# Patient Record
Sex: Female | Born: 1955 | Race: White | Hispanic: No | Marital: Single | Smoking: Never smoker
Health system: Southern US, Community
[De-identification: ages and names within clinical notes are randomized; demographics above are authoritative.]

## PROBLEM LIST (undated history)

## (undated) DIAGNOSIS — E119 Type 2 diabetes mellitus without complications: Secondary | ICD-10-CM

## (undated) DIAGNOSIS — J40 Bronchitis, not specified as acute or chronic: Secondary | ICD-10-CM

## (undated) DIAGNOSIS — S3210XA Unspecified fracture of sacrum, initial encounter for closed fracture: Secondary | ICD-10-CM

## (undated) DIAGNOSIS — F32A Depression, unspecified: Secondary | ICD-10-CM

## (undated) DIAGNOSIS — M5430 Sciatica, unspecified side: Secondary | ICD-10-CM

## (undated) DIAGNOSIS — I10 Essential (primary) hypertension: Secondary | ICD-10-CM

## (undated) DIAGNOSIS — M1990 Unspecified osteoarthritis, unspecified site: Secondary | ICD-10-CM

## (undated) DIAGNOSIS — D134 Benign neoplasm of liver: Secondary | ICD-10-CM

## (undated) DIAGNOSIS — K219 Gastro-esophageal reflux disease without esophagitis: Secondary | ICD-10-CM

## (undated) DIAGNOSIS — T7840XA Allergy, unspecified, initial encounter: Secondary | ICD-10-CM

## (undated) DIAGNOSIS — K824 Cholesterolosis of gallbladder: Secondary | ICD-10-CM

## (undated) DIAGNOSIS — F329 Major depressive disorder, single episode, unspecified: Secondary | ICD-10-CM

## (undated) DIAGNOSIS — S322XXA Fracture of coccyx, initial encounter for closed fracture: Secondary | ICD-10-CM

## (undated) HISTORY — PX: BREAST SURGERY: SHX581

## (undated) HISTORY — DX: Major depressive disorder, single episode, unspecified: F32.9

## (undated) HISTORY — DX: Essential (primary) hypertension: I10

## (undated) HISTORY — DX: Sciatica, unspecified side: M54.30

## (undated) HISTORY — DX: Unspecified osteoarthritis, unspecified site: M19.90

## (undated) HISTORY — DX: Bronchitis, not specified as acute or chronic: J40

## (undated) HISTORY — DX: Allergy, unspecified, initial encounter: T78.40XA

## (undated) HISTORY — PX: APPENDECTOMY: SHX54

## (undated) HISTORY — DX: Depression, unspecified: F32.A

## (undated) HISTORY — DX: Fracture of coccyx, initial encounter for closed fracture: S32.2XXA

## (undated) HISTORY — DX: Type 2 diabetes mellitus without complications: E11.9

## (undated) HISTORY — DX: Fracture of coccyx, initial encounter for closed fracture: S32.10XA

## (undated) HISTORY — PX: BREAST CYST EXCISION: SHX579

## (undated) HISTORY — PX: VASECTOMY: SHX75

## (undated) HISTORY — PX: WISDOM TOOTH EXTRACTION: SHX21

## (undated) HISTORY — PX: NASAL SEPTUM SURGERY: SHX37

## (undated) HISTORY — DX: Unspecified fracture of sacrum, initial encounter for closed fracture: S32.10XA

---
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
## 2001-07-10 ENCOUNTER — Ambulatory Visit
Admit: 2001-07-10 | Disposition: A | Discharge status: Home / Self Care | Payer: Self-pay | Source: Ambulatory Visit | Admitting: Otolaryngology

## 2006-08-10 ENCOUNTER — Ambulatory Visit
Admit: 2006-08-10 | Disposition: A | Discharge status: Home / Self Care | Payer: Self-pay | Source: Ambulatory Visit | Admitting: Internal Medicine

## 2006-08-10 LAB — COMPREHENSIVE METABOLIC PANEL
ALT: 33 U/L (ref 4–36)
AST (SGOT): 28 U/L (ref 10–41)
Albumin/Globulin Ratio: 1.6 (ref 1.1–1.8)
Albumin: 4.5 G/DL (ref 3.4–4.9)
Alkaline Phosphatase: 110 U/L (ref 43–112)
BUN: 13 MG/DL (ref 7–21)
Bilirubin, Total: 1.4 MG/DL — ABNORMAL HIGH (ref 0.2–1.0)
CO2: 24 MEQ/L (ref 22–31)
Calcium: 9.2 MG/DL (ref 8.6–10.2)
Chloride: 106 MEQ/L (ref 98–107)
Creatinine: 0.7 mg/dL (ref 0.6–1.5)
Globulin: 2.8 G/DL (ref 2.0–3.7)
Glucose: 74 MG/DL (ref 65–110)
Potassium: 4.4 MEQ/L (ref 3.6–5.0)
Protein, Total: 7.3 G/DL (ref 6.0–8.0)
Sodium: 142 MEQ/L (ref 136–143)

## 2006-08-10 LAB — CBC- CERNER
Hematocrit: 38.6 % (ref 37.0–47.0)
Hgb: 13.5 G/DL (ref 12.0–16.0)
MCH: 30 PG (ref 28.0–32.0)
MCHC: 34.8 G/DL (ref 32.0–36.0)
MCV: 86.1 FL (ref 80.0–100.0)
MPV: 7.3 FL — ABNORMAL LOW (ref 7.4–10.4)
Platelets: 353 /mm3 (ref 140–400)
RBC: 4.49 /mm3 (ref 4.20–5.40)
RDW: 12.7 % (ref 11.5–15.0)
WBC: 9.1 /mm3 (ref 3.5–10.8)

## 2006-08-11 LAB — LIPID PANEL
Cholesterol: 178 mg/dL (ref 50–200)
HDL: 40 mg/dL (ref 35–86)
LDL Calculated: 114 mg/dL (ref 0–130)
Triglycerides: 122 mg/dL (ref 35–135)
VLDL Calculated: 24 mg/dL (ref 10–40)

## 2006-08-11 LAB — TSH: TSH: 1.37 u[IU]/mL (ref 0.465–4.680)

## 2007-09-20 ENCOUNTER — Ambulatory Visit
Admit: 2007-09-20 | Disposition: A | Discharge status: Home / Self Care | Payer: Self-pay | Source: Ambulatory Visit | Admitting: Internal Medicine

## 2007-09-20 LAB — COMPREHENSIVE METABOLIC PANEL
ALT: 24 U/L (ref 4–36)
AST (SGOT): 23 U/L (ref 10–41)
Albumin/Globulin Ratio: 1.4 (ref 1.1–1.8)
Albumin: 4.6 G/DL (ref 3.4–4.9)
Alkaline Phosphatase: 102 U/L (ref 43–112)
BUN: 12 MG/DL (ref 7–21)
Bilirubin, Total: 1.5 MG/DL — ABNORMAL HIGH (ref 0.2–1.0)
CO2: 29 MEQ/L (ref 22–31)
Calcium: 10.1 MG/DL (ref 8.6–10.2)
Chloride: 102 MEQ/L (ref 98–107)
Creatinine: 0.9 mg/dL (ref 0.6–1.5)
Globulin: 3.2 G/DL (ref 2.0–3.7)
Glucose: 80 MG/DL (ref 65–110)
Potassium: 4.8 MEQ/L (ref 3.6–5.0)
Protein, Total: 7.8 G/DL (ref 6.0–8.0)
Sodium: 141 MEQ/L (ref 136–143)

## 2007-09-20 LAB — DIFFERENTIAL AUTOMATED
Basophils Absolute: 0 /mm3 (ref 0.0–0.2)
Basophils: 0 % (ref 0–2)
Eosinophils Absolute: 0.2 /mm3 (ref 0.0–0.7)
Eosinophils: 2 % (ref 0–5)
Granulocytes Absolute: 6.7 /mm3 (ref 1.8–8.1)
Lymphocytes Absolute: 3.3 /mm3 (ref 0.5–4.4)
Lymphocytes: 31 % (ref 15–41)
Monocytes Absolute: 0.6 /mm3 (ref 0.0–1.2)
Monocytes: 5 % (ref 0–11)
Neutrophils %: 62 % (ref 52–75)

## 2007-09-20 LAB — CBC
Hematocrit: 38.5 % (ref 37.0–47.0)
Hgb: 13 G/DL (ref 12.0–16.0)
MCH: 29.2 PG (ref 28.0–32.0)
MCHC: 33.8 G/DL (ref 32.0–36.0)
MCV: 86.5 FL (ref 80.0–100.0)
MPV: 9.7 FL (ref 9.4–12.3)
Platelets: 357 /mm3 (ref 140–400)
RBC: 4.45 /mm3 (ref 4.20–5.40)
RDW: 13.9 % (ref 11.5–15.0)
WBC: 10.76 /mm3 (ref 3.50–10.80)

## 2007-09-21 LAB — LIPID PANEL
Cholesterol: 216 mg/dL — ABNORMAL HIGH (ref 50–200)
HDL: 47 mg/dL (ref 35–86)
LDL Calculated: 129 mg/dL (ref 0–130)
Triglycerides: 200 mg/dL — ABNORMAL HIGH (ref 35–135)
VLDL Calculated: 40 mg/dL (ref 10–40)

## 2007-09-21 LAB — TSH: TSH: 1.27 u[IU]/mL (ref 0.465–4.680)

## 2007-11-24 ENCOUNTER — Emergency Department: Admit: 2007-11-24 | Payer: Self-pay | Source: Emergency Department | Admitting: Emergency Medicine

## 2007-11-24 LAB — COMPREHENSIVE METABOLIC PANEL
ALT: 31 U/L (ref 4–36)
AST (SGOT): 27 U/L (ref 10–41)
Albumin/Globulin Ratio: 1.2 (ref 1.1–1.8)
Albumin: 4 G/DL (ref 3.4–4.9)
Alkaline Phosphatase: 120 U/L — ABNORMAL HIGH (ref 43–112)
BUN: 12 MG/DL (ref 7–21)
Bilirubin, Total: 0.6 MG/DL (ref 0.2–1.0)
CO2: 28 MEQ/L (ref 22–31)
Calcium: 8.8 MG/DL (ref 8.6–10.2)
Chloride: 102 MEQ/L (ref 98–107)
Creatinine: 0.9 mg/dL (ref 0.6–1.5)
Globulin: 3.4 G/DL (ref 2.0–3.7)
Glucose: 94 MG/DL (ref 65–110)
Potassium: 4.5 MEQ/L (ref 3.6–5.0)
Protein, Total: 7.4 G/DL (ref 6.0–8.0)
Sodium: 140 MEQ/L (ref 136–143)

## 2007-11-24 LAB — CBC AND DIFFERENTIAL
Basophils Absolute: 0 /mm3 (ref 0.0–0.2)
Basophils: 0 % (ref 0–2)
Eosinophils Absolute: 0 /mm3 (ref 0.0–0.7)
Eosinophils: 0 % (ref 0–5)
Granulocytes Absolute: 7 /mm3 (ref 1.8–8.1)
Hematocrit: 36.7 % — ABNORMAL LOW (ref 37.0–47.0)
Hgb: 12.6 G/DL (ref 12.0–16.0)
Lymphocytes Absolute: 1.5 /mm3 (ref 0.5–4.4)
Lymphocytes: 17 % (ref 15–41)
MCH: 29.4 PG (ref 28.0–32.0)
MCHC: 34.3 G/DL (ref 32.0–36.0)
MCV: 85.7 FL (ref 80.0–100.0)
MPV: 9.1 FL — ABNORMAL LOW (ref 9.4–12.3)
Monocytes Absolute: 0.5 /mm3 (ref 0.0–1.2)
Monocytes: 6 % (ref 0–11)
Neutrophils %: 77 % — ABNORMAL HIGH (ref 52–75)
Platelets: 376 /mm3 (ref 140–400)
RBC: 4.28 /mm3 (ref 4.20–5.40)
RDW: 14.7 % (ref 11.5–15.0)
WBC: 9.07 /mm3 (ref 3.50–10.80)

## 2007-11-25 LAB — URINALYSIS WITH MICROSCOPIC
Bilirubin, UA: NEGATIVE
Blood, UA: NEGATIVE
Glucose, UA: NEGATIVE
Nitrite, UA: NEGATIVE
Specific Gravity UA POCT: 1.02 (ref <=1.030)
Urine pH: 7 (ref 5.0–8.0)
Urobilinogen, UA: 0.2

## 2008-08-07 ENCOUNTER — Ambulatory Visit
Admit: 2008-08-07 | Disposition: A | Discharge status: Home / Self Care | Payer: Self-pay | Source: Ambulatory Visit | Admitting: Internal Medicine

## 2008-08-07 LAB — BASIC METABOLIC PANEL
BUN: 13 MG/DL (ref 7–21)
CO2: 28 MEQ/L (ref 22–31)
Calcium: 9.5 MG/DL (ref 8.6–10.2)
Chloride: 99 MEQ/L (ref 98–107)
Creatinine: 0.8 mg/dL (ref 0.6–1.5)
Glucose: 74 MG/DL (ref 65–110)
Potassium: 4.1 MEQ/L (ref 3.6–5.0)
Sodium: 137 MEQ/L (ref 136–143)

## 2009-03-26 ENCOUNTER — Ambulatory Visit
Admit: 2009-03-26 | Disposition: A | Discharge status: Home / Self Care | Payer: Self-pay | Source: Ambulatory Visit | Admitting: Internal Medicine

## 2009-03-26 LAB — COMPREHENSIVE METABOLIC PANEL
ALT: 41 U/L — ABNORMAL HIGH (ref 4–36)
AST (SGOT): 29 U/L (ref 10–41)
Albumin/Globulin Ratio: 1.4 (ref 1.1–1.8)
Albumin: 4.3 g/dL (ref 3.4–4.9)
Alkaline Phosphatase: 104 U/L (ref 43–112)
BUN: 14 mg/dL (ref 7–21)
Bilirubin, Total: 1.4 mg/dL — ABNORMAL HIGH (ref 0.2–1.0)
CO2: 29 mEq/L (ref 22–31)
Calcium: 9.5 mg/dL (ref 8.6–10.2)
Chloride: 102 mEq/L (ref 98–107)
Creatinine: 0.8 mg/dL (ref 0.5–1.4)
Globulin: 3 g/dL (ref 2.0–3.7)
Glucose: 83 mg/dL (ref 70–100)
Potassium: 4.5 mEq/L (ref 3.6–5.0)
Protein, Total: 7.3 g/dL (ref 6.0–8.0)
Sodium: 142 mEq/L (ref 136–143)

## 2009-03-26 LAB — GFR: EGFR: >60

## 2009-10-30 ENCOUNTER — Ambulatory Visit
Admit: 2009-10-30 | Disposition: A | Discharge status: Home / Self Care | Payer: Self-pay | Source: Ambulatory Visit | Admitting: Internal Medicine

## 2009-10-30 LAB — COMPREHENSIVE METABOLIC PANEL
ALT: 45 U/L — ABNORMAL HIGH (ref 4–36)
AST (SGOT): 26 U/L (ref 10–41)
Albumin/Globulin Ratio: 1.5 (ref 1.1–1.8)
Albumin: 4.4 g/dL (ref 3.4–4.9)
Alkaline Phosphatase: 118 U/L — ABNORMAL HIGH (ref 43–112)
BUN: 14 mg/dL (ref 7–21)
Bilirubin, Total: 1.5 mg/dL — ABNORMAL HIGH (ref 0.2–1.0)
CO2: 29 mEq/L (ref 22–31)
Calcium: 9.2 mg/dL (ref 8.6–10.2)
Chloride: 104 mEq/L (ref 98–107)
Creatinine: 0.8 mg/dL (ref 0.5–1.4)
Globulin: 2.9 g/dL (ref 2.0–3.7)
Glucose: 111 mg/dL — ABNORMAL HIGH (ref 70–100)
Potassium: 5 mEq/L (ref 3.6–5.0)
Protein, Total: 7.3 g/dL (ref 6.0–8.0)
Sodium: 142 mEq/L (ref 136–143)

## 2009-10-30 LAB — TSH: TSH: 1.24 u[IU]/mL (ref 0.35–4.94)

## 2009-10-30 LAB — CBC
Hematocrit: 39.3 % (ref 37.0–47.0)
Hgb: 13.4 g/dL (ref 12.0–16.0)
MCH: 29.4 pg (ref 28.0–32.0)
MCHC: 34.1 g/dL (ref 32.0–36.0)
MCV: 86.2 fL (ref 80.0–100.0)
MPV: 9.6 fL (ref 9.4–12.3)
Platelets: 341 10*3/uL (ref 140–400)
RBC: 4.56 10*6/uL (ref 4.20–5.40)
RDW: 14 % (ref 12–15)
WBC: 7.06 10*3/uL (ref 3.50–10.80)

## 2009-10-30 LAB — LIPID PANEL
Cholesterol / HDL Ratio: 4.5 Index
Cholesterol: 192 mg/dL (ref 0–199)
HDL: 43 mg/dL (ref >40)
LDL Calculated: 123 mg/dL — ABNORMAL HIGH (ref 0–99)
Triglycerides: 130 mg/dL (ref 34–149)
VLDL Calculated: 26 mg/dL (ref 10–40)

## 2009-10-30 LAB — HEMOLYSIS INDEX: Hemolysis Index: 8 Index (ref 0–9)

## 2009-10-30 LAB — GFR: EGFR: >60

## 2009-11-05 ENCOUNTER — Ambulatory Visit: Admit: 2009-11-05 | Disposition: A | Discharge status: Home / Self Care | Payer: Self-pay | Source: Ambulatory Visit

## 2010-01-28 ENCOUNTER — Ambulatory Visit
Admit: 2010-01-28 | Disposition: A | Discharge status: Home / Self Care | Payer: Self-pay | Source: Ambulatory Visit | Admitting: Internal Medicine

## 2010-01-28 LAB — GFR: EGFR: >60

## 2010-01-28 LAB — COMPREHENSIVE METABOLIC PANEL
ALT: 41 U/L — ABNORMAL HIGH (ref 4–36)
AST (SGOT): 24 U/L (ref 10–41)
Albumin/Globulin Ratio: 1.4 (ref 1.1–1.8)
Albumin: 4.1 g/dL (ref 3.4–4.9)
Alkaline Phosphatase: 107 U/L (ref 43–112)
BUN: 15 mg/dL (ref 7–21)
Bilirubin, Total: 1.4 mg/dL — ABNORMAL HIGH (ref 0.2–1.0)
CO2: 29 mEq/L (ref 22–31)
Calcium: 9.4 mg/dL (ref 8.6–10.2)
Chloride: 102 mEq/L (ref 98–107)
Creatinine: 0.9 mg/dL (ref 0.5–1.4)
Globulin: 3 g/dL (ref 2.0–3.7)
Glucose: 84 mg/dL (ref 70–100)
Potassium: 4 mEq/L (ref 3.6–5.0)
Protein, Total: 7.1 g/dL (ref 6.0–8.0)
Sodium: 140 mEq/L (ref 136–143)

## 2010-01-28 LAB — HEMOGLOBIN A1C: Hemoglobin A1C: 5.5 % (ref 0.0–6.0)

## 2010-10-13 LAB — ECG 12-LEAD
Atrial Rate: 66 {beats}/min
P Axis: 54 degrees
P-R Interval: 146 ms
Q-T Interval: 392 ms
QRS Duration: 76 ms
QTC Calculation (Bezet): 410 ms
R Axis: 10 degrees
T Axis: 30 degrees
Ventricular Rate: 66 {beats}/min

## 2011-03-03 ENCOUNTER — Ambulatory Visit
Admit: 2011-03-03 | Discharge: 2011-03-03 | Disposition: A | Discharge status: Home / Self Care | Payer: Self-pay | Source: Ambulatory Visit

## 2011-03-03 LAB — CBC
Hematocrit: 38.5 % (ref 37.0–47.0)
Hgb: 12.6 g/dL (ref 12.0–16.0)
MCH: 29.2 pg (ref 28.0–32.0)
MCHC: 32.7 g/dL (ref 32.0–36.0)
MCV: 89.3 fL (ref 80.0–100.0)
MPV: 10.3 fL (ref 9.4–12.3)
Nucleated RBC: 0 /100 WBC
Platelets: 305 10*3/uL (ref 140–400)
RBC: 4.31 10*6/uL (ref 4.20–5.40)
RDW: 13 % (ref 12–15)
WBC: 6.12 10*3/uL (ref 3.50–10.80)

## 2011-03-03 LAB — COMPREHENSIVE METABOLIC PANEL
ALT: 26 U/L (ref 0–55)
AST (SGOT): 24 U/L (ref 5–34)
Albumin/Globulin Ratio: 1.3 (ref 0.9–2.2)
Albumin: 4 g/dL (ref 3.5–5.0)
Alkaline Phosphatase: 99 U/L (ref 40–150)
BUN: 16 mg/dL (ref 6.0–20.0)
Bilirubin, Total: 1.2 mg/dL (ref 0.1–1.2)
CO2: 28 mEq/L (ref 21–30)
Calcium: 10 mg/dL (ref 8.5–10.5)
Chloride: 104 mEq/L (ref 96–109)
Creatinine: 0.8 mg/dL (ref 0.4–1.5)
Globulin: 3 g/dL (ref 2.0–3.7)
Glucose: 81 mg/dL (ref 70–100)
Potassium: 4.1 mEq/L (ref 3.5–5.3)
Protein, Total: 7 g/dL (ref 6.0–8.3)
Sodium: 143 mEq/L (ref 135–146)

## 2011-03-03 LAB — HEMOLYSIS INDEX: Hemolysis Index: 5 Index (ref 0–9)

## 2011-03-03 LAB — LIPID PANEL
Cholesterol / HDL Ratio: 3.4 Index
Cholesterol: 186 mg/dL (ref 0–199)
HDL: 54 mg/dL (ref >40)
LDL Calculated: 111 mg/dL — ABNORMAL HIGH (ref 0–99)
Triglycerides: 105 mg/dL (ref 34–149)
VLDL Calculated: 21 mg/dL (ref 10–40)

## 2011-03-03 LAB — TSH: TSH: 1.1 u[IU]/mL (ref 0.35–4.94)

## 2011-03-03 LAB — GFR: EGFR: >60

## 2011-03-04 LAB — VITAMIN D-25 HYDROXY (D2/D3/TOTAL)
Vitamin D 25-OH D2: <4 ng/mL
Vitamin D 25-OH D3: 44 ng/mL
Vitamin D 25-OH Total: 44 ng/mL (ref 30–100)

## 2013-12-18 ENCOUNTER — Other Ambulatory Visit: Payer: Self-pay

## 2013-12-18 ENCOUNTER — Ambulatory Visit
Admission: RE | Admit: 2013-12-18 | Discharge: 2013-12-18 | Disposition: A | Discharge status: Home / Self Care | Payer: No Typology Code available for payment source | Source: Ambulatory Visit

## 2013-12-18 DIAGNOSIS — M79671 Pain in right foot: Secondary | ICD-10-CM

## 2015-01-21 ENCOUNTER — Other Ambulatory Visit: Payer: Self-pay | Admitting: Otolaryngology

## 2015-01-21 DIAGNOSIS — J322 Chronic ethmoidal sinusitis: Secondary | ICD-10-CM

## 2015-01-22 ENCOUNTER — Other Ambulatory Visit: Payer: Self-pay | Admitting: Otolaryngology

## 2015-01-22 ENCOUNTER — Ambulatory Visit
Admission: RE | Admit: 2015-01-22 | Discharge: 2015-01-22 | Disposition: A | Discharge status: Home / Self Care | Payer: PPO | Source: Ambulatory Visit | Attending: Otolaryngology | Admitting: Otolaryngology

## 2015-01-22 DIAGNOSIS — J342 Deviated nasal septum: Secondary | ICD-10-CM | POA: Insufficient documentation

## 2015-01-22 DIAGNOSIS — J322 Chronic ethmoidal sinusitis: Secondary | ICD-10-CM

## 2015-01-22 DIAGNOSIS — J3489 Other specified disorders of nose and nasal sinuses: Secondary | ICD-10-CM | POA: Insufficient documentation

## 2015-03-05 DIAGNOSIS — I10 Essential (primary) hypertension: Secondary | ICD-10-CM

## 2015-03-05 HISTORY — DX: Essential (primary) hypertension: I10

## 2015-03-10 HISTORY — PX: SINUS SURGERY: SHX187

## 2015-03-19 DIAGNOSIS — E559 Vitamin D deficiency, unspecified: Secondary | ICD-10-CM | POA: Insufficient documentation

## 2015-11-02 ENCOUNTER — Ambulatory Visit (INDEPENDENT_AMBULATORY_CARE_PROVIDER_SITE_OTHER): Payer: BC Managed Care – PPO | Admitting: Surgery

## 2015-11-02 ENCOUNTER — Encounter (INDEPENDENT_AMBULATORY_CARE_PROVIDER_SITE_OTHER): Payer: Self-pay | Admitting: Surgery

## 2015-11-02 VITALS — BP 132/77 | HR 72 | Resp 12 | Ht 64.0 in | Wt 183.4 lb

## 2015-11-02 DIAGNOSIS — Z1231 Encounter for screening mammogram for malignant neoplasm of breast: Secondary | ICD-10-CM

## 2015-11-02 DIAGNOSIS — R928 Other abnormal and inconclusive findings on diagnostic imaging of breast: Secondary | ICD-10-CM

## 2015-11-02 DIAGNOSIS — Z1239 Encounter for other screening for malignant neoplasm of breast: Secondary | ICD-10-CM

## 2015-11-02 NOTE — Progress Notes (Signed)
Breast Cancer Screening Exam    History:  Diane Wolfe is a 60 y.o. female who presents to the office for breast cancer screening.  She is at an average risk for breast cancer.  The patient is known to me from my prior practice.  She has lost 84 pounds on an organic diet. She lost it over a period of a year. She denies GI complaints or breast complaints. She had her mammogram done today and it looks different due to weight loss.      Past Medical History :  History reviewed. No pertinent past medical history.    Past Surgical History:  Past Surgical History:   Procedure Laterality Date   . APPENDECTOMY  1969   . SINUS SURGERY  03/2015       Family History:  Family History   Problem Relation Age of Onset   . Breast cancer Paternal Aunt        Social History:  Social History     Social History   . Marital status: Single     Spouse name: N/A   . Number of children: N/A   . Years of education: N/A     Occupational History   . Not on file.     Social History Main Topics   . Smoking status: Never Smoker   . Smokeless tobacco: Never Used   . Alcohol use No   . Drug use: No   . Sexual activity: Not on file     Other Topics Concern   . Not on file     Social History Narrative   . No narrative on file        ALLERGIES:  Allergies   Allergen Reactions   . Penicillins        Medications:  No current outpatient prescriptions on file.    Review of Systems:  Constitutional: Negative.   HENT: Negative.   Respiratory: Negative.   Cardiovascular: Negative.   Gastrointestinal: Negative for nausea, vomiting, diarrhea, constipation and abdominal distention.   Genitourinary: Negative.   Musculoskeletal: Negative.   Skin: no history of jaundice   Allergic/Immunologic: Negative for immunocompromised state.   Neurological: Negative.   Hematological: Negative for adenopathy. Does not bruise/bleed easily.   Psychiatric/Behavioral: Negative.     Objective:  Vitals:    11/02/15 1252   BP: 132/77   BP Site: Left arm   Patient  Position: Sitting   Cuff Size: Medium   Pulse: 72   Resp: 12   Weight: 83.2 kg (183 lb 6.4 oz)   Height: 1.626 m (5\' 4" )     Body mass index is 31.48 kg/m.     Physical Exam:   Constitutional: Alert, oriented. Body mass index is 31.48 kg/m.  Head: Normocephalic and atraumatic.   Eyes: No scleral icterus.   Neck: Normal range of motion. There is no cervical adenopathy  Cardiovascular: Normal rate, normal S1,2, without rubs, gallops, or murmurs.    Pulmonary/Chest: Effort normal, BS equal, without rales, wheezes, or stridor.    Breast: The breasts are symmetric, without skin changes, nipple changes or dimpling.  There are no palpable masses on either side.  There is no axillary adenopathy. She has smaller breasts than on previous exams. She has no sign of inframammary sebaceous cysts.  Musculoskeletal: Normal range of motion.   Neurological: alert and oriented to person, place, and time.   Skin:warm and dry  Psychiatric: Normal mood and affect. Behavior is normal. Judgment and thought  content normal.   Nursing note and vitals reviewed.    Radiology: [facility] mammography was performed today bilaterally.    The reports were authenticated and the imaging was personally reviewed.  They are normal.   Additional views were needed due to the loss of breast fat.        Assessment:No evidence of breast cancer      Plan:Follow up in 1 year with repeat mammography and examination.   She tells me that she may be relocating. Advised annual mammography  In [place], where she will likely be going.      Harvie Bridge MD, FACS

## 2016-01-01 ENCOUNTER — Encounter (INDEPENDENT_AMBULATORY_CARE_PROVIDER_SITE_OTHER): Payer: Self-pay | Admitting: Surgery

## 2017-04-14 ENCOUNTER — Encounter: Payer: Self-pay | Admitting: Emergency Medicine

## 2017-04-14 ENCOUNTER — Other Ambulatory Visit: Payer: Self-pay

## 2017-04-14 ENCOUNTER — Emergency Department
Admission: EM | Admit: 2017-04-14 | Discharge: 2017-04-14 | Disposition: A | Discharge status: Home / Self Care | Payer: Managed Care, Other (non HMO) | Attending: Internal Medicine | Admitting: Internal Medicine

## 2017-04-14 ENCOUNTER — Emergency Department: Payer: Managed Care, Other (non HMO)

## 2017-04-14 DIAGNOSIS — Y929 Unspecified place or not applicable: Secondary | ICD-10-CM | POA: Insufficient documentation

## 2017-04-14 DIAGNOSIS — X58XXXA Exposure to other specified factors, initial encounter: Secondary | ICD-10-CM | POA: Insufficient documentation

## 2017-04-14 DIAGNOSIS — I10 Essential (primary) hypertension: Secondary | ICD-10-CM | POA: Diagnosis not present

## 2017-04-14 DIAGNOSIS — Y999 Unspecified external cause status: Secondary | ICD-10-CM | POA: Insufficient documentation

## 2017-04-14 DIAGNOSIS — S8392XA Sprain of unspecified site of left knee, initial encounter: Secondary | ICD-10-CM | POA: Diagnosis not present

## 2017-04-14 DIAGNOSIS — S86912A Strain of unspecified muscle(s) and tendon(s) at lower leg level, left leg, initial encounter: Secondary | ICD-10-CM

## 2017-04-14 DIAGNOSIS — M25562 Pain in left knee: Secondary | ICD-10-CM | POA: Diagnosis present

## 2017-04-14 DIAGNOSIS — M1712 Unilateral primary osteoarthritis, left knee: Secondary | ICD-10-CM | POA: Diagnosis not present

## 2017-04-14 DIAGNOSIS — Y939 Activity, unspecified: Secondary | ICD-10-CM | POA: Insufficient documentation

## 2017-04-14 HISTORY — DX: Essential (primary) hypertension: I10

## 2017-04-14 IMAGING — DX DG KNEE COMPLETE 4+V*L*
4 series · 4 of 4 positions shown · non-contrast
Comparison: None.

CLINICAL DATA: Pain and swelling for a few days.

EXAM:
LEFT KNEE - COMPLETE 4+ VIEW

[knee ap]
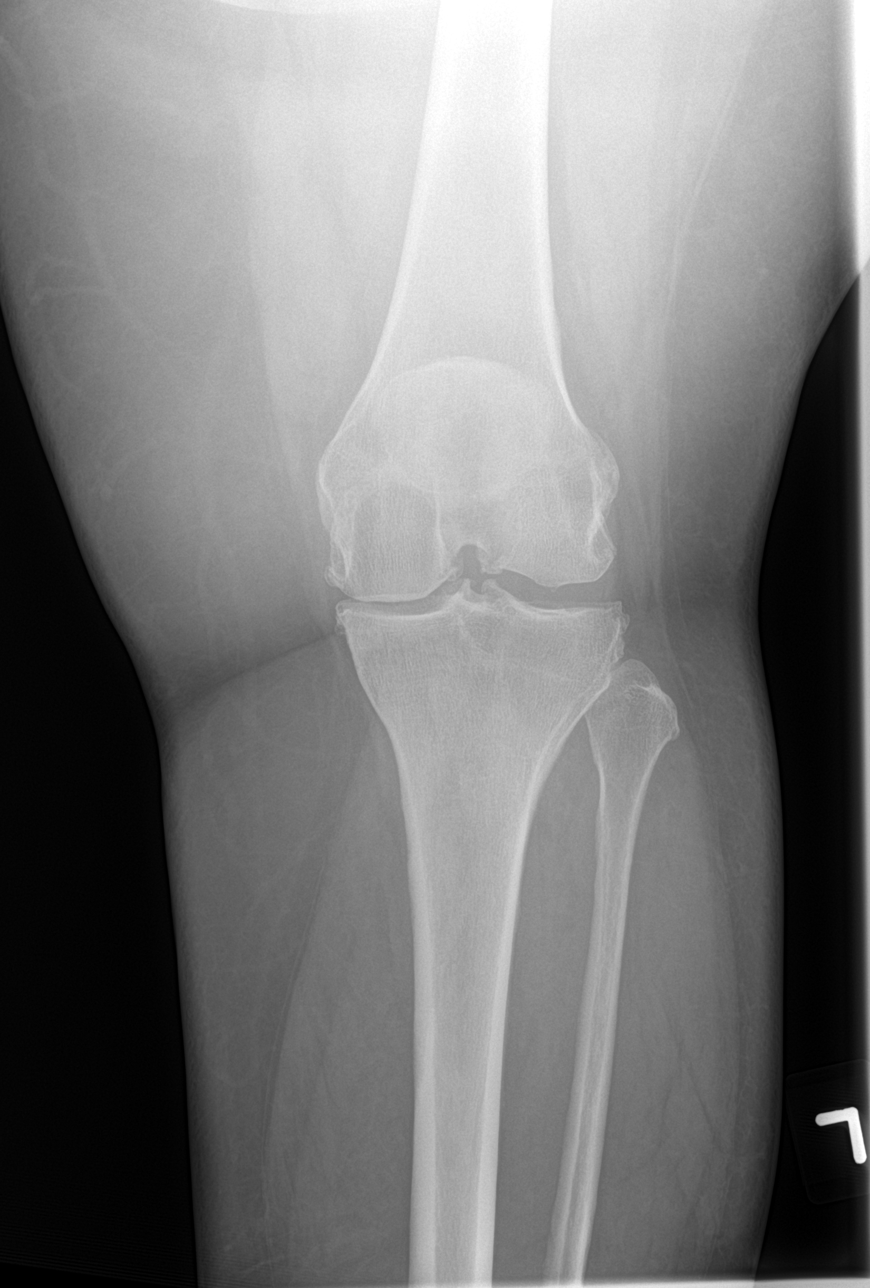

[knee lat]
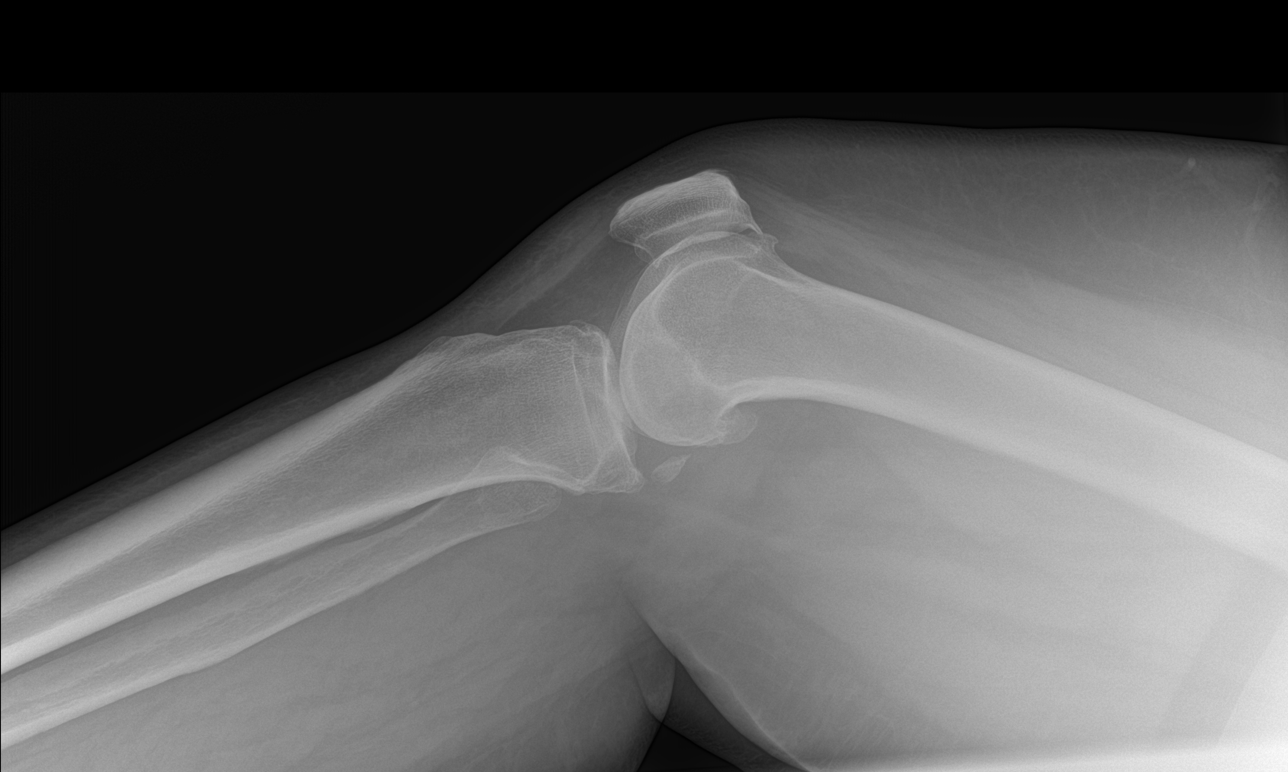

[knee obl (1 of 2)]
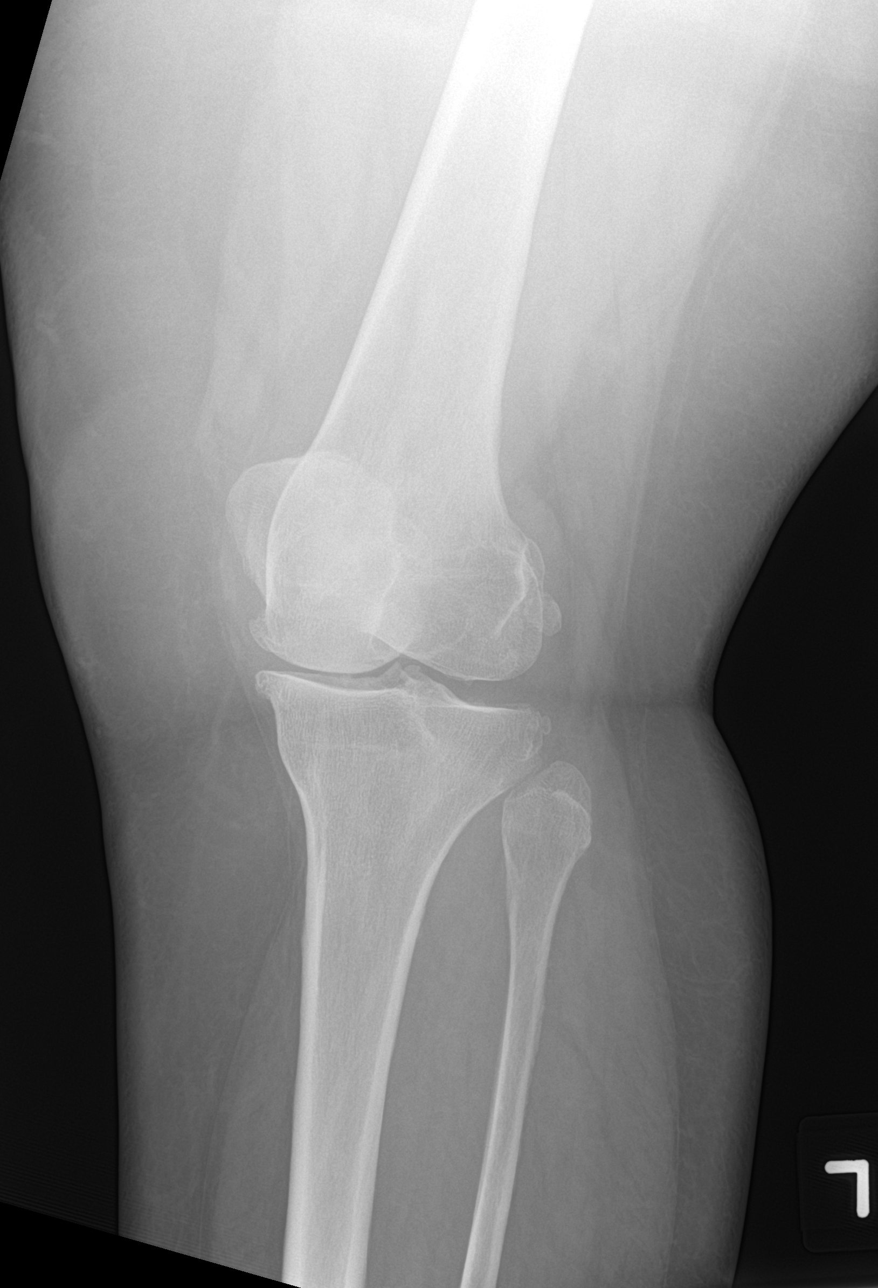

[knee obl (2 of 2)]
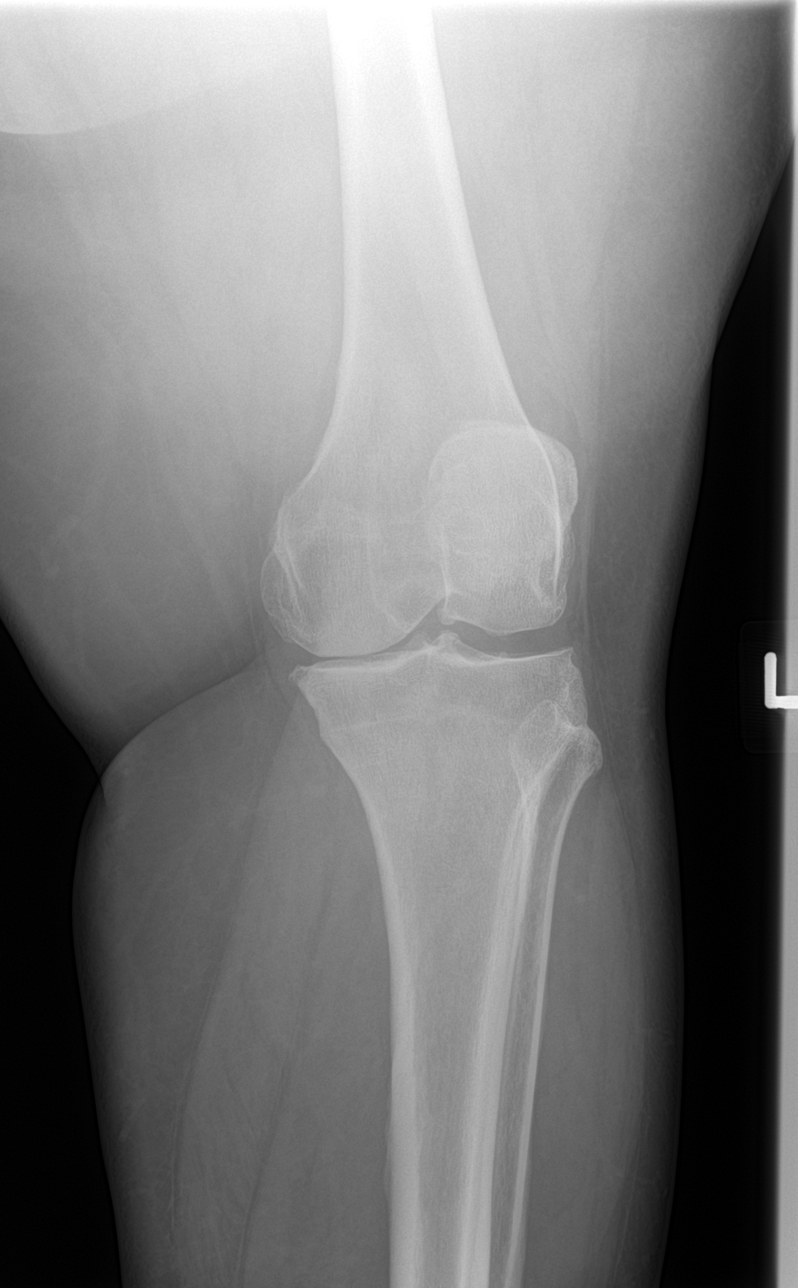

[4 of 4 positions shown; findings below may reference images not displayed]

FINDINGS: No fracture or dislocation. No joint effusion. Moderate
tricompartmental degenerative changes.
IMPRESSION: Moderate tricompartmental degenerative changes.

## 2017-04-14 MED ORDER — CYCLOBENZAPRINE HCL 5 MG PO TABS: 5.0000 mg | ORAL_TABLET | Freq: Three times a day (TID) | ORAL | 0 refills | Status: DC | PRN

## 2017-04-14 NOTE — ED Provider Notes (Signed)
[facility] Emergency Department Provider Note ____________________________________________  Time seen: 1204  I have reviewed the triage vital signs and the nursing notes.  HISTORY  Chief Complaint  Knee Pain  HPI Tina Stephenson is a 62 y.o. female resents to the ED accompanied by family, for evaluation of a 2-3-day complaint of left knee pain.  Patient describes she was at work on Friday, when she gingerly went to stretch her legs out under her desk.  She noted at the time she had an immediate pop to the posterior aspect of the left knee.  She was initially able to ambulate but as the days went on she describes increasing tightness and inability to fully extend the knee.  She is been ambulating on her toes and describes pain to the posterior aspect of the knee.  She denies any history of previous or ongoing knee problems.  She ha been taking Tylenol and Motrin for pain relief.  Past Medical History:  Diagnosis Date  . Hypertension     There are no active problems to display for this patient.   Past Surgical History:  Procedure Laterality Date  . APPENDECTOMY    . NASAL SEPTUM SURGERY      Prior to Admission medications   Medication Sig Start Date End Date Taking? Authorizing Provider  cyclobenzaprine (FLEXERIL) 5 MG tablet Take 1 tablet (5 mg total) by mouth 3 (three) times daily as needed for muscle spasms. 04/14/17   Averill Winters, Dannielle Karvonen, PA-C    Allergies Penicillins  No family history on file.  Social History Social History   Tobacco Use  . Smoking status: Never Smoker  . Smokeless tobacco: Never Used  Substance Use Topics  . Alcohol use: Not Currently  . Drug use: Not on file    Review of Systems  Constitutional: Negative for fever. Cardiovascular: Negative for chest pain. Respiratory: Negative for shortness of breath. Musculoskeletal: Negative for back pain.  Left knee pain as above. Skin: Negative for rash. Neurological: Negative  for headaches, focal weakness or numbness. ____________________________________________  PHYSICAL EXAM:  VITAL SIGNS: ED Triage Vitals  Enc Vitals Group     BP 04/14/17 1119 (!) 146/69     Pulse Rate 04/14/17 1119 62     Resp 04/14/17 1119 18     Temp 04/14/17 1119 98.6 F (37 C)     Temp Source 04/14/17 1119 Oral     SpO2 04/14/17 1119 99 %     Weight 04/14/17 1103 230 lb (104.3 kg)     Height 04/14/17 1103 5\' 4"  (1.626 m)     Head Circumference --      Peak Flow --      Pain Score 04/14/17 1102 10     Pain Loc --      Pain Edu? --      Excl. in [place]? --     Constitutional: Alert and oriented. Well appearing and in no distress. Head: Normocephalic and atraumatic. Cardiovascular: Normal rate, regular rhythm. Normal distal pulses. Respiratory: Normal respiratory effort. No wheezes/rales/rhonchi. Musculoskeletal: Left knee without obvious deformity, dislocation, or effusion.  Patient with normal flexion and extension range, but has tenderness to palpation to the posterior aspect of the knee.  Her pain is primarily to the medial hamstring insertion.  No popliteal space fullness is noted.  There is some mild tenderness to the posterior calf, but no muscle deformity is noted.  Ankle exam is benign at this time.  There is some increased pain  with full extension of the knee as well as some medial pain with valgus stress.  Nontender with normal range of motion in all extremities.  Neurologic:  No gross focal neurologic deficits are appreciated. Skin:  Skin is warm, dry and intact. No rash noted. ____________________________________________   RADIOLOGY  Left Knee IMPRESSION: Moderate tricompartmental degenerative changes. ____________________________________________  PROCEDURES  Procedures Knee immobilizer crutches ____________________________________________  INITIAL IMPRESSION / ASSESSMENT AND PLAN / ED COURSE  Patient with a ED evaluation of left knee pain posteriorly.  Her  exam is overall more consistent with a hamstring strain and underlying DJD than any internal derangement to the knee.  Patient understands the underlying changes to the knee joint.  She will be placed in a knee immobilizer for comfort as well as given crutches to ambulate with weightbearing as tolerated.  She will follow-up with orthopedics or return to the ED as necessary.  A prescription for Flexeril is provided for pain relief as necessary. ____________________________________________  FINAL CLINICAL IMPRESSION(S) / ED DIAGNOSES  Final diagnoses:  Strain of left knee, initial encounter  Primary osteoarthritis of left knee      Melvenia Needles, PA-C 04/14/17 1901    Earleen Newport, MD 04/17/17 [phone]

## 2017-04-14 NOTE — ED Notes (Addendum)
Pt reports stretching left knee at work and "heard a snap" in the back of her knee. She has noticed increased swelling to the area since, which the majority of it on the medial aspect. Pt states that her pain has been increasing daily as well, and was unable to bear weight this morning on it. Also states at times she "cannot feel her [left] foot."   Pt reports taking tylenol and advil for pain since Wednesday with some relief but no relief from the swelling.

## 2017-04-14 NOTE — ED Notes (Signed)
This RN applied knee immobilizer to left knee and instructed pt on how to use crutches. We will practice with crutches prior to discharge, but pt verbalized effective teachback re: how to properly use crutches.

## 2017-04-14 NOTE — Discharge Instructions (Addendum)
Your exam is consistent with a knee sprain and hamstring muscle strain. Wear the knee immobilizer as needed for support. Apply ice or moist heat to reduce pain. Take the muscle relaxant as needed. See ortho for ongoing symptoms.

## 2017-04-14 NOTE — ED Triage Notes (Signed)
Pt states she was stretching her left knee at her desk a few days ago, states she felt a "pop" and has been having pain and swelling since then.

## 2017-04-14 NOTE — ED Notes (Signed)
Family impatiently requesting results from XR. This RN informed them the results only came back 4 minutes ago and that the provider will update them once they have reviewed the report and images.

## 2017-07-24 ENCOUNTER — Ambulatory Visit (INDEPENDENT_AMBULATORY_CARE_PROVIDER_SITE_OTHER): Payer: Managed Care, Other (non HMO) | Admitting: Podiatry

## 2017-07-24 ENCOUNTER — Encounter: Payer: Self-pay | Admitting: Podiatry

## 2017-07-24 DIAGNOSIS — Z79899 Other long term (current) drug therapy: Secondary | ICD-10-CM | POA: Diagnosis not present

## 2017-07-24 DIAGNOSIS — B351 Tinea unguium: Secondary | ICD-10-CM | POA: Diagnosis not present

## 2017-07-24 MED ORDER — TERBINAFINE HCL 250 MG PO TABS: 250.0000 mg | ORAL_TABLET | Freq: Every day | ORAL | 0 refills | Status: DC

## 2017-08-01 LAB — HEPATIC FUNCTION PANEL
ALK PHOS: 117 IU/L (ref 39–117)
ALT: 19 IU/L (ref 0–32)
AST: 18 IU/L (ref 0–40)
Albumin: 4.6 g/dL (ref 3.6–4.8)
BILIRUBIN TOTAL: 1 mg/dL (ref 0.0–1.2)
BILIRUBIN, DIRECT: 0.22 mg/dL (ref 0.00–0.40)
Total Protein: 7 g/dL (ref 6.0–8.5)

## 2017-08-02 NOTE — Progress Notes (Signed)
   Subjective: 63 year old female presenting today as a new patient with a chief complaint of possible nail fungus of bilateral great toes that has been present for several months. Patient states she had nail avulsion procedures to the toes for the treatment of ingrowns. She states the nails grew back discolored and thickened. She was treated with an antifungal nail lacquer that did not seem to help. There are no modifying factors noted. Patient presents today for further treatment and evaluation.  Past Medical History:  Diagnosis Date  . Hypertension     Objective: Physical Exam General: The patient is alert and oriented x3 in no acute distress.  Dermatology: Hyperkeratotic, discolored, thickened, onychodystrophy of nails noted bilaterally. Skin is warm, dry and supple bilateral lower extremities. Negative for open lesions or macerations.  Vascular: Palpable pedal pulses bilaterally. No edema or erythema noted. Capillary refill within normal limits.  Neurological: Epicritic and protective threshold grossly intact bilaterally.   Musculoskeletal Exam: Range of motion within normal limits to all pedal and ankle joints bilateral. Muscle strength 5/5 in all groups bilateral.   Assessment: #1 onychomycosis bilateral nails 1-5   Plan of Care:  #1 Patient was evaluated. #2 Orders for liver function tests were ordered today. Office will call if results are abnormal.  #3 Prescription for Lamisil 250 mg #90 provided to patient.  #4 Patient may benefit from laser treatment in the future.  #5 Return to clinic as needed.    Edrick Kins, DPM [facility]  Dr. Edrick Kins, [address]                                        [place], [place] [address]                Office [phone]  Fax [phone]

## 2017-09-24 ENCOUNTER — Telehealth: Payer: Self-pay | Admitting: Podiatry

## 2017-09-24 NOTE — Telephone Encounter (Signed)
Pt said Dr. Amalia Hailey put her on 90 days of medication and Insurance will only pay for 60 days. Insurance will reinburse pt. For the money she spent out of pocket for medication but Dr. Hazel Sams to call the [facility] ([facility] @ [phone] before that will take place.

## 2017-10-01 NOTE — Telephone Encounter (Signed)
Prior authorization was done, They denied coverage for Terbinafine.  Patient was contacted and notified via voice mail of denial.

## 2018-02-21 ENCOUNTER — Other Ambulatory Visit: Payer: Self-pay

## 2018-02-21 ENCOUNTER — Ambulatory Visit: Payer: Managed Care, Other (non HMO) | Admitting: Family Medicine

## 2018-02-21 ENCOUNTER — Ambulatory Visit
Admission: RE | Admit: 2018-02-21 | Discharge: 2018-02-21 | Disposition: A | Discharge status: Home / Self Care | Payer: Managed Care, Other (non HMO) | Attending: Family Medicine | Admitting: Family Medicine

## 2018-02-21 ENCOUNTER — Encounter: Payer: Self-pay | Admitting: Family Medicine

## 2018-02-21 ENCOUNTER — Ambulatory Visit
Admission: RE | Admit: 2018-02-21 | Discharge: 2018-02-21 | Disposition: A | Discharge status: Home / Self Care | Payer: Managed Care, Other (non HMO) | Source: Ambulatory Visit | Attending: Family Medicine | Admitting: Family Medicine

## 2018-02-21 VITALS — BP 124/82 | HR 100 | Temp 98.4°F | Resp 16 | Ht 64.0 in | Wt 262.7 lb

## 2018-02-21 DIAGNOSIS — R05 Cough: Secondary | ICD-10-CM | POA: Insufficient documentation

## 2018-02-21 DIAGNOSIS — E559 Vitamin D deficiency, unspecified: Secondary | ICD-10-CM

## 2018-02-21 DIAGNOSIS — Z8679 Personal history of other diseases of the circulatory system: Secondary | ICD-10-CM | POA: Diagnosis not present

## 2018-02-21 DIAGNOSIS — B351 Tinea unguium: Secondary | ICD-10-CM

## 2018-02-21 DIAGNOSIS — S3210XS Unspecified fracture of sacrum, sequela: Secondary | ICD-10-CM | POA: Diagnosis not present

## 2018-02-21 DIAGNOSIS — Z13820 Encounter for screening for osteoporosis: Secondary | ICD-10-CM

## 2018-02-21 DIAGNOSIS — Z1239 Encounter for other screening for malignant neoplasm of breast: Secondary | ICD-10-CM

## 2018-02-21 DIAGNOSIS — Z13220 Encounter for screening for lipoid disorders: Secondary | ICD-10-CM

## 2018-02-21 DIAGNOSIS — R059 Cough, unspecified: Secondary | ICD-10-CM

## 2018-02-21 DIAGNOSIS — E66813 Obesity, class 3: Secondary | ICD-10-CM

## 2018-02-21 DIAGNOSIS — Z6841 Body Mass Index (BMI) 40.0 and over, adult: Secondary | ICD-10-CM

## 2018-02-21 DIAGNOSIS — S322XXS Fracture of coccyx, sequela: Secondary | ICD-10-CM

## 2018-02-21 HISTORY — DX: Unspecified fracture of sacrum, sequela: S32.10XS

## 2018-02-21 IMAGING — CR DG CHEST 2V
1 series · 2 of 2 positions shown · non-contrast
Comparison: None.

CLINICAL DATA: Cough 2 months.

EXAM:
CHEST - 2 VIEW

[Series 1: dg chest 2 view · 0.14mm/px · 2 of 2 slices shown]
[im 1/2]
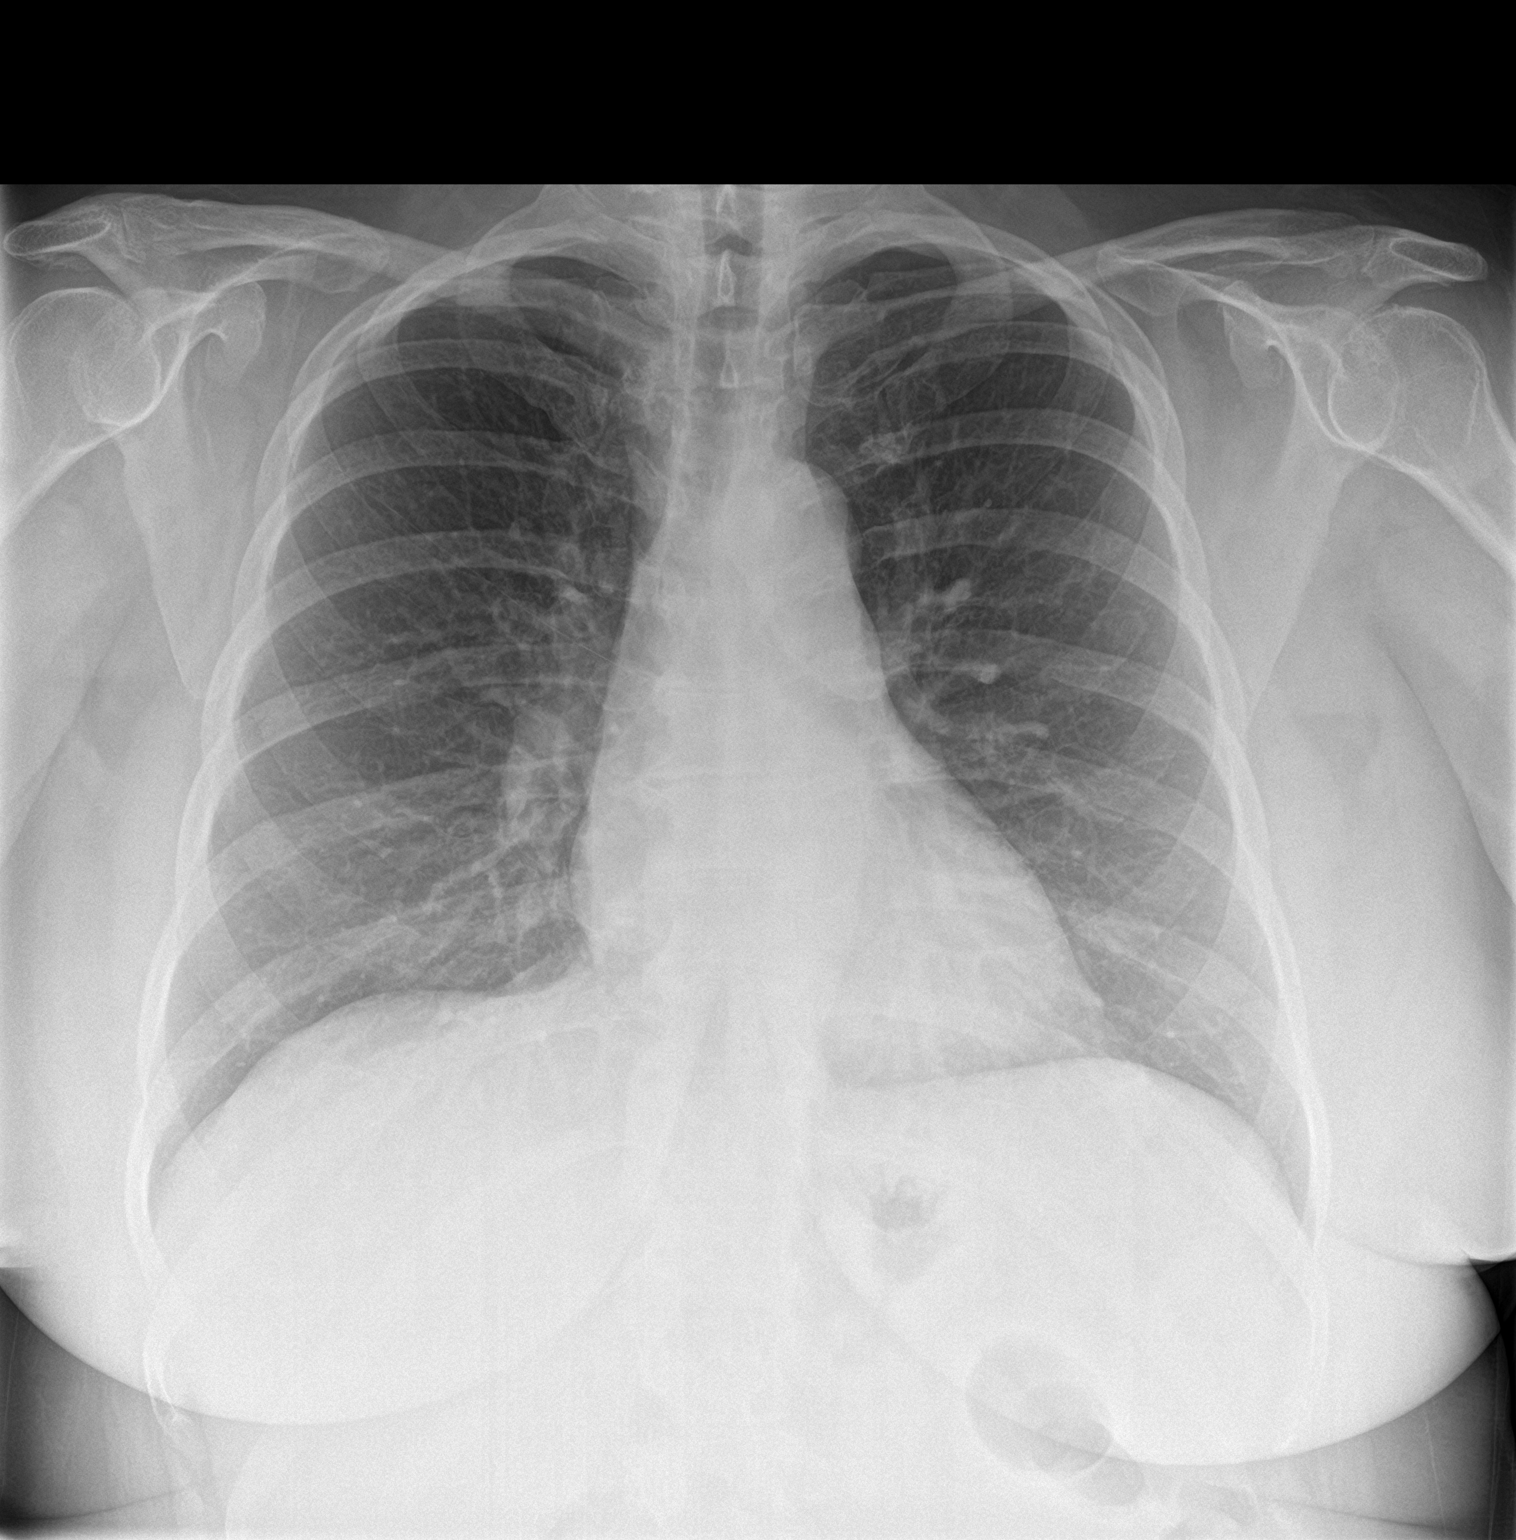
[im 2/2]
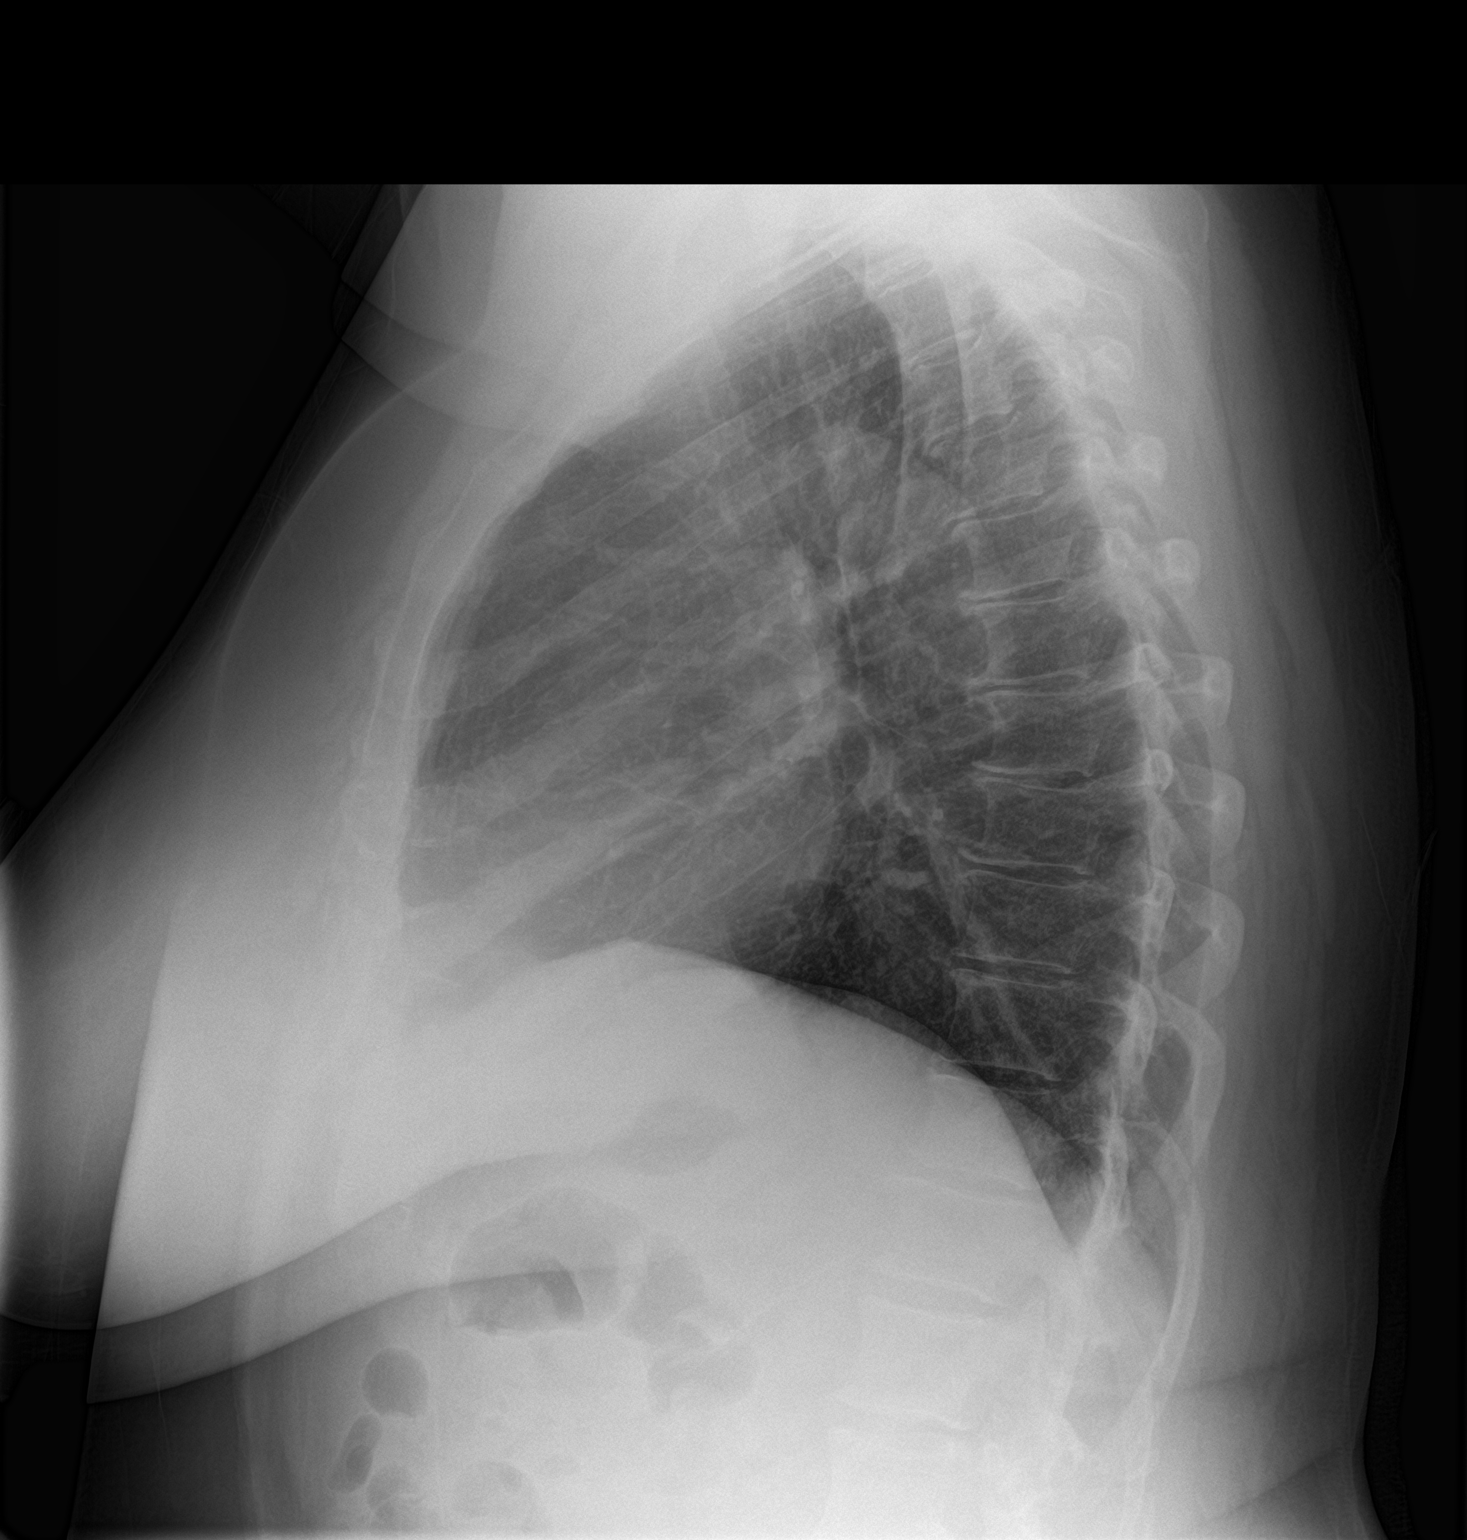

[2 of 2 positions shown; findings below may reference images not displayed]

FINDINGS: Lungs are clear. Cardiomediastinal silhouette is within normal. Mild
degenerative change of the spine.
IMPRESSION: No active cardiopulmonary disease.

## 2018-02-21 MED ORDER — BUDESONIDE-FORMOTEROL FUMARATE 80-4.5 MCG/ACT IN AERO: 2.0000 | INHALATION_SPRAY | Freq: Two times a day (BID) | RESPIRATORY_TRACT | 3 refills | Status: DC

## 2018-02-21 MED ORDER — PROMETHAZINE-DM 6.25-15 MG/5ML PO SYRP: 5.0000 mL | ORAL_SOLUTION | Freq: Four times a day (QID) | ORAL | 0 refills | Status: DC | PRN

## 2018-02-21 NOTE — Progress Notes (Signed)
New Patient Office Visit  Subjective:  Patient ID: Tina Stephenson, female    DOB: 1955/11/28  Age: 63 y.o. MRN: [id number]  CC:  Chief Complaint  Patient presents with  . Establish Care  . Cough    seen at Urgent Care no better after finishing medication  . Shoulder Pain    left worse than right    HPI Tina Stephenson presents to establish care.  Cough: She was seen at [facility] on 02/10/18 for respiratory infection and dx'd with acute bronchitis.  She did not have an Xray at that time, but was given prednisone, doxy, and cough syrup - completed course of doxy and prednisone, symptoms went away. Cough returned 2 days after completing doxycycline.  She is a retired Materials engineer, but otherwise no known chemical exposures, she is a never smoker.  She denies history of acid reflux.  Her cough is worse at night, especially after laying down.   Onychomycosis: She tried lamisil, did not work, she is going to have to do laser treatment. She is seeing Dr. Amalia Hailey.  Affected nails are bilateral great toes.  Low back pain: Chronic since age 25 when she broke her tailbone during a skiing accident.  Does not limit her AROM, just bothers her when it is very cold outside; does also have a chronic ache.  HTN: She was taken off of her medication by previous PCP because she lost weight and her BP dropped.  She endorses ongoing headaches, some shortness of breath with current cough, but otherwise stable, no chest pain, does have trace BLE edema that goes away at night.   Obesity: Heaviest weight is 286lbs.  She moved from [place] last year and has not been walking as much here as she used to when she lived in [place].  Wants to get back to watching her portions and walking more. She moved here to retire in the next few years.  She can exercise in the evenings and during her breaks at work. Grills most of her food, has cut back on sodas but still drinking them, eating plenty of fruits and vegetables.  Does not eat fish; does eat  chicken, beef 1-2 times a week.   Past Medical History:  Diagnosis Date  . Depression   . Hypertension   . Hypertensive disorder 03/05/2015  . Sacrum and coccyx fracture [facility])     Past Surgical History:  Procedure Laterality Date  . APPENDECTOMY    . NASAL SEPTUM SURGERY    . WISDOM TOOTH EXTRACTION      Family History  Problem Relation Age of Onset  . COPD Mother   . Cancer Mother   . Heart disease Father     Social History   Socioeconomic History  . Marital status: Single    Spouse name: Not on file  . Number of children: 0  . Years of education: Not on file  . Highest education level: Not on file  Occupational History  . Not on file  Social Needs  . Financial resource strain: Not on file  . Food insecurity:    Worry: Never true    Inability: Never true  . Transportation needs:    Medical: No    Non-medical: No  Tobacco Use  . Smoking status: Never Smoker  . Smokeless tobacco: Never Used  Substance and Sexual Activity  . Alcohol use: Never    Frequency: Never  . Drug use: Never  . Sexual activity: Not Currently  Lifestyle  .  Physical activity:    Days per week: 5 days    Minutes per session: 10 min  . Stress: Not at all  Relationships  . Social connections:    Talks on phone: Twice a week    Gets together: More than three times a week    Attends religious service: 1 to 4 times per year    Active member of club or organization: No    Attends meetings of clubs or organizations: Never    Relationship status: Never married  . Intimate partner violence:    Fear of current or ex partner: No    Emotionally abused: No    Physically abused: No    Forced sexual activity: No  Other Topics Concern  . Not on file  Social History Narrative  . Not on file    ROS Review of Systems  All other systems reviewed and are negative.  Objective:   Today's Vitals: BP 124/82 (BP Location: Right Arm, Patient Position: Sitting, Cuff Size: Large)   Pulse 100    Temp 98.4 F (36.9 C) (Oral)   Resp 16   Ht 5\' 4"  (1.626 m)   Wt 262 lb 11.2 oz (119.2 kg)   SpO2 96%   BMI 45.09 kg/m   Physical Exam Vitals signs and nursing note reviewed.  Constitutional:      General: She is not in acute distress.    Appearance: She is well-developed.  HENT:     Head: Normocephalic and atraumatic.     Nose: Nose normal.     Mouth/Throat:     Pharynx: No oropharyngeal exudate.  Eyes:     Conjunctiva/sclera: Conjunctivae normal.     Pupils: Pupils are equal, round, and reactive to light.  Neck:     Musculoskeletal: Normal range of motion and neck supple.     Vascular: No JVD.  Cardiovascular:     Rate and Rhythm: Normal rate and regular rhythm.     Heart sounds: Normal heart sounds.  Pulmonary:     Effort: Pulmonary effort is normal.     Breath sounds: Normal breath sounds.  Musculoskeletal: Normal range of motion.        General: No tenderness.  Skin:    General: Skin is warm and dry.     Findings: No rash.  Neurological:     Mental Status: She is alert and oriented to person, place, and time.     Cranial Nerves: No cranial nerve deficit.  Psychiatric:        Behavior: Behavior normal.        Thought Content: Thought content normal.        Judgment: Judgment normal.    Assessment & Plan:   Problem List Items Addressed This Visit      Musculoskeletal and Integument   Sacrum and coccyx fracture, sequela   Onychomycosis     Other   Vitamin D deficiency   Relevant Orders   HM DEXA SCAN (Completed)   Vitamin D (25 hydroxy)   Class 3 severe obesity due to excess calories without serious comorbidity with body mass index (BMI) of 45.0 to 49.9 in adult [facility])   Relevant Orders   Lipid panel   TSH   Cough - Primary   Relevant Medications   budesonide-formoterol (SYMBICORT) 80-4.5 MCG/ACT inhaler   promethazine-dextromethorphan (PROMETHAZINE-DM) 6.25-15 MG/5ML syrup   Other Relevant Orders   DG Chest 2 View   CBC w/Diff/Platelet   History  of hypertension   Relevant  Orders   Comprehensive metabolic panel    Other Visit Diagnoses    Breast cancer screening       Relevant Orders   MM 3D SCREEN BREAST BILATERAL   Osteoporosis screening       Relevant Orders   HM DEXA SCAN (Completed)   Vitamin D (25 hydroxy)   Lipid screening       Relevant Orders   Lipid panel     Outpatient Encounter Medications as of 02/21/2018  Medication Sig  . budesonide-formoterol (SYMBICORT) 80-4.5 MCG/ACT inhaler Inhale 2 puffs into the lungs 2 (two) times daily.  Marland Kitchen oxyCODONE-acetaminophen (PERCOCET/ROXICET) 5-325 MG tablet oxycodone-acetaminophen 5 mg-325 mg tablet  . promethazine-dextromethorphan (PROMETHAZINE-DM) 6.25-15 MG/5ML syrup Take 5 mLs by mouth 4 (four) times daily as needed for cough.  . [DISCONTINUED] cyclobenzaprine (FLEXERIL) 5 MG tablet Take 1 tablet (5 mg total) by mouth 3 (three) times daily as needed for muscle spasms. (Patient not taking: Reported on 02/21/2018)  . [DISCONTINUED] terbinafine (LAMISIL) 250 MG tablet Take 1 tablet (250 mg total) by mouth daily. (Patient not taking: Reported on 02/21/2018)   No facility-administered encounter medications on file as of 02/21/2018.    Follow-up: Return in about 4 weeks (around 03/21/2018) for Annual CPE.   Hubbard Hartshorn, FNP

## 2018-02-22 LAB — COMPREHENSIVE METABOLIC PANEL
ALBUMIN: 4.3 g/dL (ref 3.8–4.9)
ALK PHOS: 123 IU/L — AB (ref 39–117)
ALT: 16 IU/L (ref 0–32)
AST: 13 IU/L (ref 0–40)
Albumin/Globulin Ratio: 1.9 (ref 1.2–2.2)
BILIRUBIN TOTAL: 1.3 mg/dL — AB (ref 0.0–1.2)
BUN / CREAT RATIO: 15 (ref 12–28)
BUN: 11 mg/dL (ref 8–27)
CHLORIDE: 99 mmol/L (ref 96–106)
CO2: 25 mmol/L (ref 20–29)
CREATININE: 0.74 mg/dL (ref 0.57–1.00)
Calcium: 9.3 mg/dL (ref 8.7–10.3)
GFR calc non Af Amer: 88 mL/min/{1.73_m2} (ref >59)
GFR, EST AFRICAN AMERICAN: 102 mL/min/{1.73_m2} (ref >59)
GLUCOSE: 100 mg/dL — AB (ref 65–99)
Globulin, Total: 2.3 g/dL (ref 1.5–4.5)
Potassium: 4.4 mmol/L (ref 3.5–5.2)
Sodium: 138 mmol/L (ref 134–144)
TOTAL PROTEIN: 6.6 g/dL (ref 6.0–8.5)

## 2018-02-22 LAB — CBC WITH DIFFERENTIAL/PLATELET
BASOS ABS: 0.1 10*3/uL (ref 0.0–0.2)
Basos: 1 %
EOS (ABSOLUTE): 0.1 10*3/uL (ref 0.0–0.4)
Eos: 2 %
HEMOGLOBIN: 12.8 g/dL (ref 11.1–15.9)
Hematocrit: 37.9 % (ref 34.0–46.6)
Immature Grans (Abs): 0 10*3/uL (ref 0.0–0.1)
Immature Granulocytes: 1 %
LYMPHS: 36 %
Lymphocytes Absolute: 3.1 10*3/uL (ref 0.7–3.1)
MCH: 28.4 pg (ref 26.6–33.0)
MCHC: 33.8 g/dL (ref 31.5–35.7)
MCV: 84 fL (ref 79–97)
MONOCYTES: 6 %
Monocytes Absolute: 0.5 10*3/uL (ref 0.1–0.9)
NEUTROS ABS: 4.7 10*3/uL (ref 1.4–7.0)
NEUTROS PCT: 54 %
Platelets: 320 10*3/uL (ref 150–450)
RBC: 4.5 x10E6/uL (ref 3.77–5.28)
RDW: 13.6 % (ref 11.7–15.4)
WBC: 8.5 10*3/uL (ref 3.4–10.8)

## 2018-02-22 LAB — LIPID PANEL
CHOLESTEROL TOTAL: 198 mg/dL (ref 100–199)
Chol/HDL Ratio: 3.7 ratio (ref 0.0–4.4)
HDL: 54 mg/dL (ref >39)
LDL Calculated: 118 mg/dL — ABNORMAL HIGH (ref 0–99)
Triglycerides: 129 mg/dL (ref 0–149)
VLDL CHOLESTEROL CAL: 26 mg/dL (ref 5–40)

## 2018-02-22 LAB — TSH: TSH: 1.56 u[IU]/mL (ref 0.450–4.500)

## 2018-02-22 LAB — VITAMIN D 25 HYDROXY (VIT D DEFICIENCY, FRACTURES): VIT D 25 HYDROXY: 30.5 ng/mL (ref 30.0–100.0)

## 2018-02-26 ENCOUNTER — Telehealth: Payer: Self-pay | Admitting: Family Medicine

## 2018-02-26 NOTE — Telephone Encounter (Signed)
Copied from [place] [id number]. Topic: General - Other >> Feb 26, 2018  6:05 PM Alfredia Ferguson R wrote: Patient called in and stated imaging didn't receive her order for her dexa scan

## 2018-02-28 NOTE — Telephone Encounter (Signed)
Please send order

## 2018-02-28 NOTE — Telephone Encounter (Signed)
Dexa screening is already ordered, please call [facility] to find out if I need to change the order to something different.

## 2018-03-08 ENCOUNTER — Ambulatory Visit
Admission: RE | Admit: 2018-03-08 | Discharge: 2018-03-08 | Disposition: A | Discharge status: Home / Self Care | Payer: Managed Care, Other (non HMO) | Source: Ambulatory Visit | Attending: Family Medicine | Admitting: Family Medicine

## 2018-03-08 DIAGNOSIS — Z1231 Encounter for screening mammogram for malignant neoplasm of breast: Secondary | ICD-10-CM | POA: Diagnosis not present

## 2018-03-08 DIAGNOSIS — Z1239 Encounter for other screening for malignant neoplasm of breast: Secondary | ICD-10-CM | POA: Diagnosis present

## 2018-03-08 IMAGING — MG DIGITAL SCREENING BILATERAL MAMMOGRAM WITH TOMO AND CAD
8 series · 8 of 24 positions shown · non-contrast
Comparison: Previous exam(s).

CLINICAL DATA: Screening.

EXAM:
DIGITAL SCREENING BILATERAL MAMMOGRAM WITH TOMO AND CAD

[L MLO synth-2D]
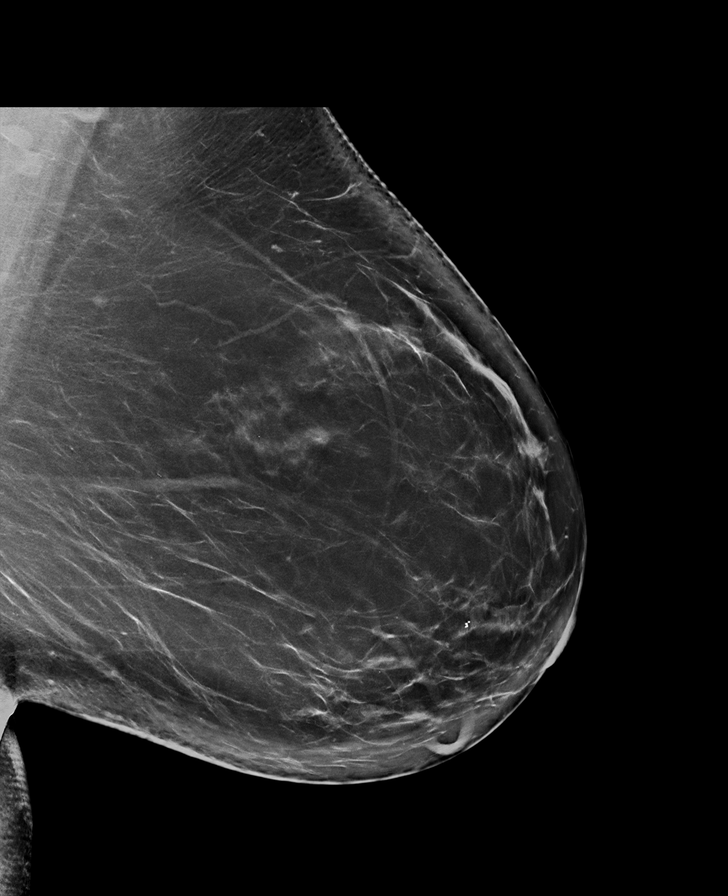

[R MLO synth-2D]
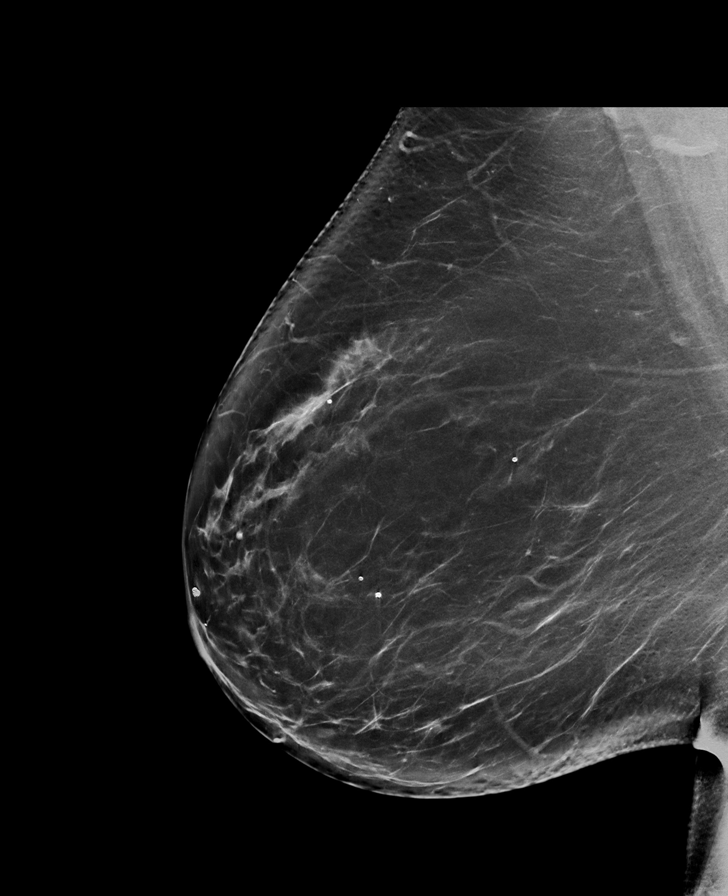

[R CC synth-2D]
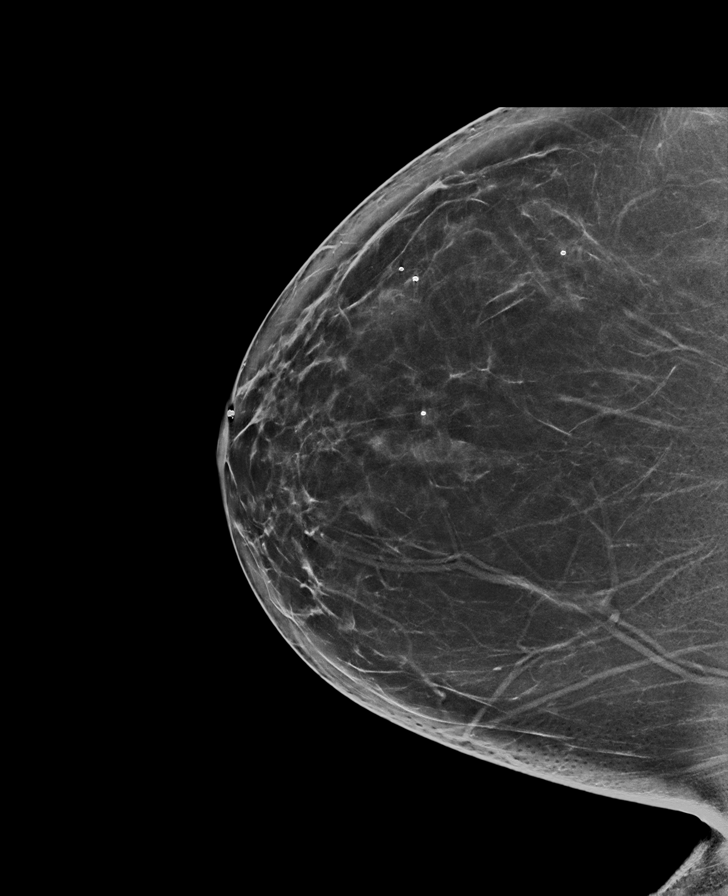

[L CC synth-2D]
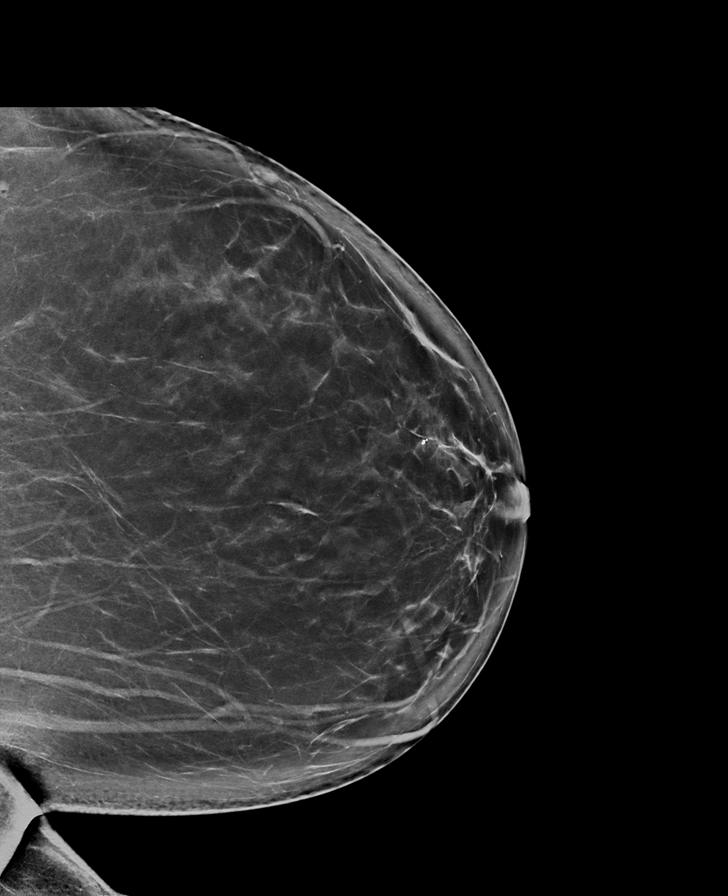

[R CC tomo · tomo slice 45/89.0]
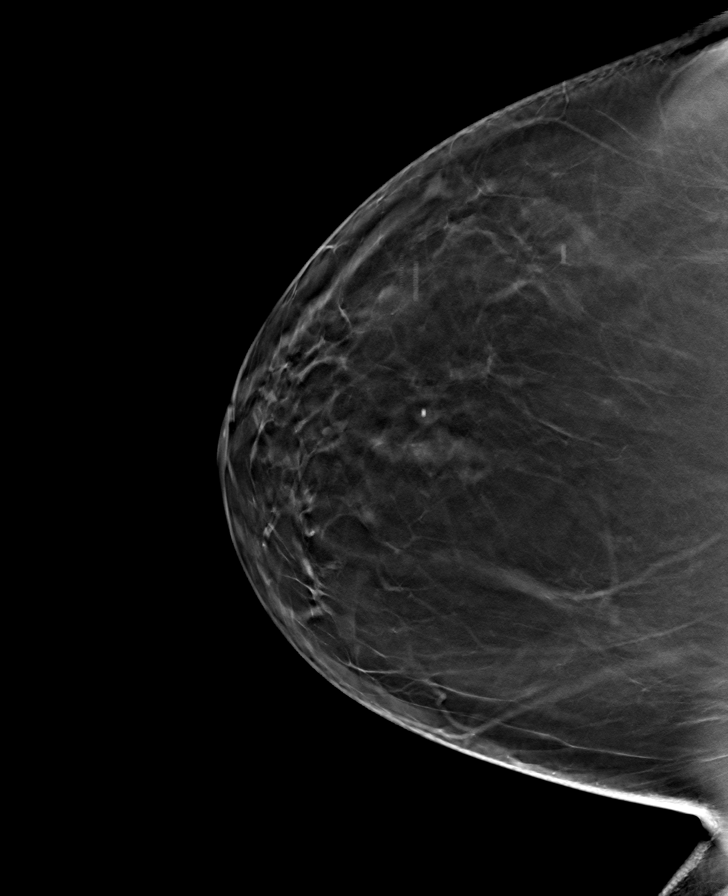

[R MLO tomo · tomo slice 50/99.0]
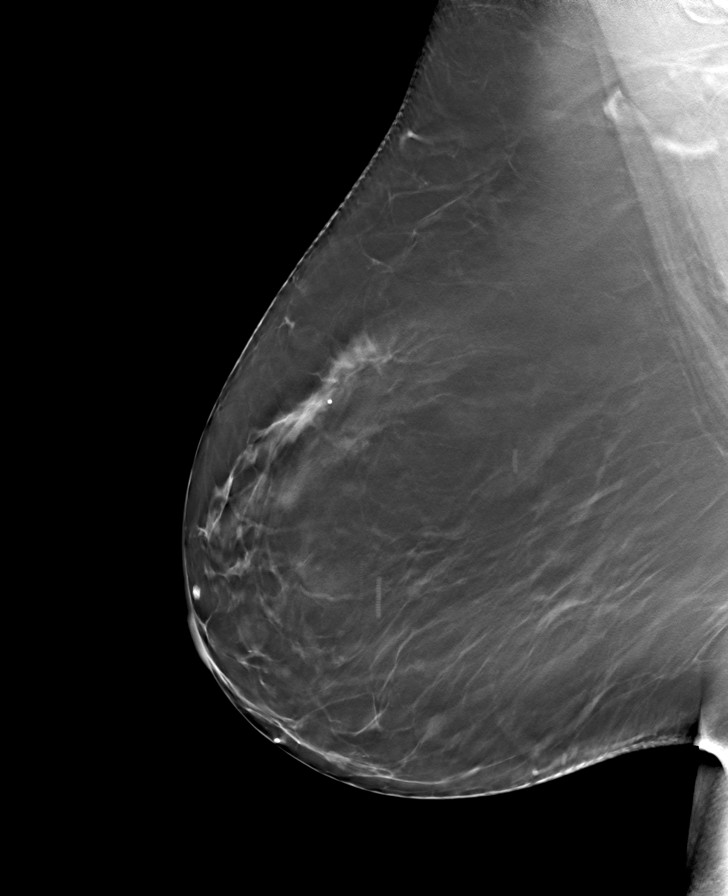

[L CC tomo · tomo slice 47/93.0]
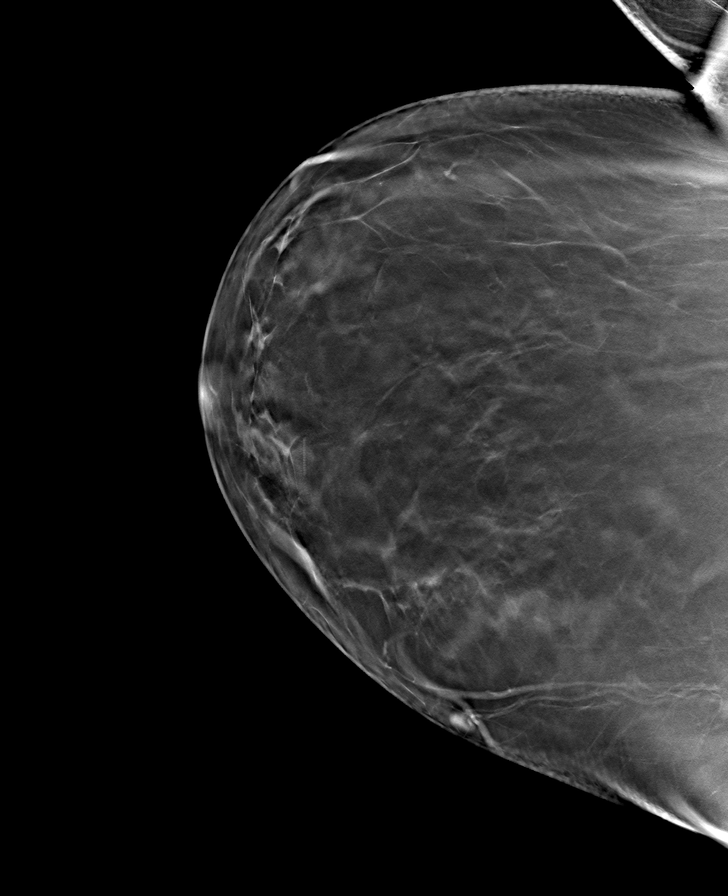

[L MLO tomo · tomo slice 51/102.0]
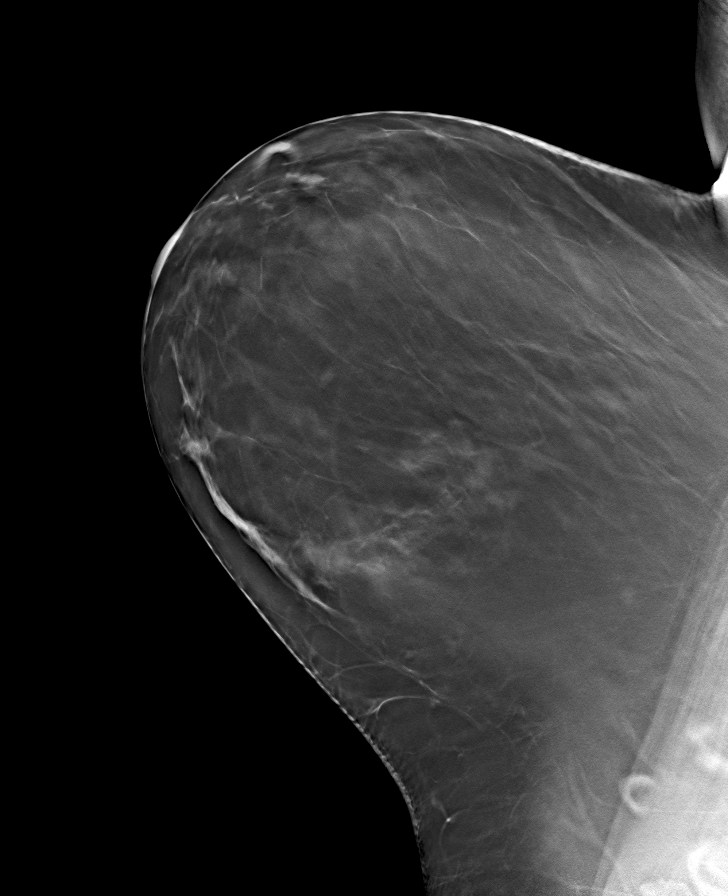

[8 of 24 positions shown; findings below may reference images not displayed]

ACR Breast Density Category b: There are scattered areas of
fibroglandular density.
FINDINGS: There are no findings suspicious for malignancy. Images were
processed with CAD.
IMPRESSION: No mammographic evidence of malignancy. A result letter of this
screening mammogram will be mailed directly to the patient.

RECOMMENDATION:
Screening mammogram in one year. (Code:[TQ])

BI-RADS CATEGORY  1: Negative.

## 2018-03-08 NOTE — Addendum Note (Signed)
Addended by: Inda Coke on: 03/08/2018 01:25 PM   Modules accepted: Orders

## 2018-03-08 NOTE — Addendum Note (Signed)
Addended by: Inda Coke on: 03/08/2018 01:39 PM   Modules accepted: Orders

## 2018-03-12 ENCOUNTER — Telehealth: Payer: Self-pay | Admitting: Family Medicine

## 2018-03-12 DIAGNOSIS — R059 Cough, unspecified: Secondary | ICD-10-CM

## 2018-03-12 DIAGNOSIS — R05 Cough: Secondary | ICD-10-CM

## 2018-03-12 MED ORDER — ALBUTEROL SULFATE (2.5 MG/3ML) 0.083% IN NEBU: 2.5000 mg | INHALATION_SOLUTION | Freq: Four times a day (QID) | RESPIRATORY_TRACT | 1 refills | Status: DC | PRN

## 2018-03-12 NOTE — Telephone Encounter (Signed)
Patient informed of new inhaler sent to [facility] and to continue Symbicort. If cough continues then to come back in to be re-evaluated.

## 2018-03-12 NOTE — Telephone Encounter (Signed)
-----   Message from Hollie Salk, [place] sent at 03/12/2018  9:24 AM EST ----- Patient notified of results. She stated please do not order 3D mammogram any more due to insurance will not pay.  She stated that her cough has came back and can something be sent in for her

## 2018-03-12 NOTE — Telephone Encounter (Signed)
Cough can last for 6-8 weeks after illness, please make sure she is using her symbicort inhaler.  I can send in albuterol as well.  If she continues to have cough, she needs to come in for re-evaluation.

## 2018-03-13 ENCOUNTER — Telehealth: Payer: Self-pay

## 2018-03-13 DIAGNOSIS — R05 Cough: Secondary | ICD-10-CM

## 2018-03-13 DIAGNOSIS — R059 Cough, unspecified: Secondary | ICD-10-CM

## 2018-03-13 DIAGNOSIS — R0602 Shortness of breath: Secondary | ICD-10-CM

## 2018-03-13 NOTE — Telephone Encounter (Signed)
Patient called to inform Tiffany that she called [employer] [facility] and they said they have no record of an order for a nebulizer.  Please advise and call patient back to clarify this order.  CB# [phone]

## 2018-03-13 NOTE — Telephone Encounter (Signed)
Nebulizer machine was ordered and filled out on [facility]. I called the patient but she was unavailable and left the information for [employer] [phone]. Also that we faxed her order in today.

## 2018-03-13 NOTE — Telephone Encounter (Signed)
I called [employer] and they confirmed they never received it even though we got a successive transmission fax. I refaxed the order to [employer] and will call them back tomorrow to make sure they received her order.

## 2018-03-13 NOTE — Telephone Encounter (Signed)
Copied from [place] [id number]. Topic: General - Other >> Mar 12, 2018  6:23 PM Mcneil, Ja-Kwan wrote: Reason for CRM: Pt states she needs a nebulizer to be ordered through a medical supply store so that her insurance will cover it. Pt requests call back.

## 2018-03-14 NOTE — Telephone Encounter (Signed)
Spoke with [employer] and they received the order. The nebulizer machine is getting mailed out today and they have tried to reach out to the patient to notify her. They left her a voicemail.

## 2018-03-25 ENCOUNTER — Encounter: Payer: Self-pay | Admitting: Family Medicine

## 2018-03-25 ENCOUNTER — Ambulatory Visit: Payer: Managed Care, Other (non HMO) | Admitting: Family Medicine

## 2018-03-25 ENCOUNTER — Other Ambulatory Visit: Payer: Self-pay

## 2018-03-25 VITALS — BP 140/80 | HR 72 | Temp 98.2°F | Resp 16 | Ht 64.0 in | Wt 263.4 lb

## 2018-03-25 DIAGNOSIS — R05 Cough: Secondary | ICD-10-CM

## 2018-03-25 DIAGNOSIS — Z872 Personal history of diseases of the skin and subcutaneous tissue: Secondary | ICD-10-CM | POA: Insufficient documentation

## 2018-03-25 DIAGNOSIS — G8929 Other chronic pain: Secondary | ICD-10-CM

## 2018-03-25 DIAGNOSIS — Z8669 Personal history of other diseases of the nervous system and sense organs: Secondary | ICD-10-CM | POA: Insufficient documentation

## 2018-03-25 DIAGNOSIS — R059 Cough, unspecified: Secondary | ICD-10-CM

## 2018-03-25 DIAGNOSIS — M25512 Pain in left shoulder: Secondary | ICD-10-CM

## 2018-03-25 DIAGNOSIS — M419 Scoliosis, unspecified: Secondary | ICD-10-CM

## 2018-03-25 DIAGNOSIS — M25511 Pain in right shoulder: Secondary | ICD-10-CM

## 2018-03-25 HISTORY — DX: Personal history of diseases of the skin and subcutaneous tissue: Z87.2

## 2018-03-25 MED ORDER — ALBUTEROL SULFATE (2.5 MG/3ML) 0.083% IN NEBU: 2.5000 mg | INHALATION_SOLUTION | Freq: Four times a day (QID) | RESPIRATORY_TRACT | 0 refills | Status: DC | PRN

## 2018-03-25 MED ORDER — BENZONATATE 100 MG PO CAPS: 100.0000 mg | ORAL_CAPSULE | Freq: Three times a day (TID) | ORAL | 0 refills | Status: DC | PRN

## 2018-03-25 NOTE — Progress Notes (Signed)
Name: Tina Stephenson   MRN: [id number]    DOB: 12-Sep-1957   Date:61/16/2020       Progress Note  Subjective  Chief Complaint  Chief Complaint  Patient presents with  . Follow-up    1 month recheck    HPI  Cough follow up: She was seen here about 1 month ago for cough, she was given doxycycline and prednisone at [facility], then came to our clinic with ongoing cough and shortness of breath.  She has never been a smoker, has history of "walking pneumonia".  She has been using symbicort daily and albuterol PRN.  We will provide tessalon today. She is seeing an overall improvement in her symptoms.   Hx Migraines: Used to have weekly/daily, now has one every 3 months.  Her chiropractor helped her quite a bit.  Typically has photosensitivity and sound sensitivity, has pressure - usually like a tight band around the head, may have tremors and nausea.   Chronic Back Pain: Chronic since age 35 when she broke her tailbone during a skiing accident.  Does not limit her AROM, just bothers her when it is very cold outside; does also have a chronic ache.  Did also have severe whiplash to her neck - C1 & 2 cause issues with her scoliosis. Bilateral shoulder have been experiencing some increased intermittent pain and tingling - RIGHT shoulder tingling, LEFT shoulder has some weakness and pain.  Patient Active Problem List   Diagnosis Date Noted  . Sacrum and coccyx fracture, sequela 02/21/2018  . Class 3 severe obesity due to excess calories without serious comorbidity with body mass index (BMI) of 45.0 to 49.9 in adult ([place]) 02/21/2018  . Cough 02/21/2018  . Onychomycosis 02/21/2018  . History of hypertension 02/21/2018  . Vitamin D deficiency 03/19/2015   Past Surgical History:  Procedure Laterality Date  . APPENDECTOMY    . BREAST CYST EXCISION Left    x2  . BREAST CYST EXCISION Right    unable to see scar  . NASAL SEPTUM SURGERY    . WISDOM TOOTH EXTRACTION      Family History  Problem Relation  Age of Onset  . COPD Mother   . Cancer Mother   . Heart disease Father   . Breast cancer Paternal Aunt     Social History   Socioeconomic History  . Marital status: Single    Spouse name: Not on file  . Number of children: 0  . Years of education: Not on file  . Highest education level: Not on file  Occupational History  . Not on file  Social Needs  . Financial resource strain: Not on file  . Food insecurity:    Worry: Never true    Inability: Never true  . Transportation needs:    Medical: No    Non-medical: No  Tobacco Use  . Smoking status: Never Smoker  . Smokeless tobacco: Never Used  Substance and Sexual Activity  . Alcohol use: Never    Frequency: Never  . Drug use: Never  . Sexual activity: Not Currently  Lifestyle  . Physical activity:    Days per week: 5 days    Minutes per session: 10 min  . Stress: Not at all  Relationships  . Social connections:    Talks on phone: Twice a week    Gets together: More than three times a week    Attends religious service: 1 to 4 times per year    Active member of  club or organization: No    Attends meetings of clubs or organizations: Never    Relationship status: Never married  . Intimate partner violence:    Fear of current or ex partner: No    Emotionally abused: No    Physically abused: No    Forced sexual activity: No  Other Topics Concern  . Not on file  Social History Narrative  . Not on file     Current Outpatient Medications:  .  albuterol (PROVENTIL) (2.5 MG/3ML) 0.083% nebulizer solution, Take 3 mLs (2.5 mg total) by nebulization every 6 (six) hours as needed for wheezing or shortness of breath., Disp: 150 mL, Rfl: 1 .  budesonide-formoterol (SYMBICORT) 80-4.5 MCG/ACT inhaler, Inhale 2 puffs into the lungs 2 (two) times daily., Disp: 1 Inhaler, Rfl: 3 .  oxyCODONE-acetaminophen (PERCOCET/ROXICET) 5-325 MG tablet, oxycodone-acetaminophen 5 mg-325 mg tablet, Disp: , Rfl:  .  promethazine-dextromethorphan  (PROMETHAZINE-DM) 6.25-15 MG/5ML syrup, Take 5 mLs by mouth 4 (four) times daily as needed for cough. (Patient not taking: Reported on 03/25/2018), Disp: 118 mL, Rfl: 0  Allergies  Allergen Reactions  . Penicillins Other (See Comments)    "lose my hearing"    I personally reviewed active problem list, medication list, allergies, notes from last encounter, lab results with the patient/caregiver today.   ROS  Ten systems reviewed and is negative except as mentioned in HPI.  Objective  Vitals:   03/25/18 1439  BP: 140/80  Pulse: 72  Resp: 16  Temp: 98.2 F (36.8 C)  TempSrc: Oral  SpO2: 95%  Weight: 263 lb 6.4 oz (119.5 kg)  Height: 5\' 4"  (1.626 m)   Body mass index is 45.21 kg/m.  Physical Exam  Constitutional: Patient appears well-developed and well-nourished. No distress.  HENT: Head: Normocephalic and atraumatic. Ears: bilateral TMs with no erythema or effusion; Nose: Nose normal. Mouth/Throat: Oropharynx is clear and moist. No oropharyngeal exudate or tonsillar swelling.  Eyes: Conjunctivae and EOM are normal. No scleral icterus.  Neck: Normal range of motion. Neck supple. No JVD present. No thyromegaly present.  Cardiovascular: Normal rate, regular rhythm and normal heart sounds.  No murmur heard. No BLE edema. Pulmonary/Chest: Effort normal and breath sounds normal. No respiratory distress. Musculoskeletal: Normal range of motion, no joint effusions. No gross deformities Neurological: Pt is alert and oriented to person, place, and time. No cranial nerve deficit. Coordination, balance, strength, speech and gait are normal.  Skin: Skin is warm and dry. No rash noted. No erythema.  Psychiatric: Patient has a normal mood and affect. behavior is normal. Judgment and thought content normal.  No results found for this or any previous visit (from the past 72 hour(s)).  PHQ2/9: Depression screen [facility] 2/9 03/25/2018 02/21/2018  Decreased Interest 0 0  Down, Depressed, Hopeless  0 1  PHQ - 2 Score 0 1  Altered sleeping 0 0  Tired, decreased energy 0 1  Change in appetite 0 1  Feeling bad or failure about yourself  0 3  Trouble concentrating 0 0  Moving slowly or fidgety/restless 0 0  Suicidal thoughts 0 0  PHQ-9 Score 0 6  Difficult doing work/chores Not difficult at all Somewhat difficult   PHQ-2/9 Result is negative.    Fall Risk: Fall Risk  03/25/2018 02/21/2018  Falls in the past year? 0 0  Number falls in past yr: 0 0  Injury with Fall? 0 0  Follow up Falls evaluation completed Falls evaluation completed    Assessment & Plan  1. History of migraine - Stable, no medication indicated at this time  2. Scoliosis of cervicothoracic spine, unspecified scoliosis type - Ambulatory referral to Orthopedics  3. History of cyst of breast - Noted in chart, cannot do 3D mammograms.  4. Cough - Improving, will return if not resolving in 2-4 weeks. - benzonatate (TESSALON PERLES) 100 MG capsule; Take 1 capsule (100 mg total) by mouth 3 (three) times daily as needed.  Dispense: 20 capsule; Refill: 0 - albuterol (PROVENTIL) (2.5 MG/3ML) 0.083% nebulizer solution; Take 3 mLs (2.5 mg total) by nebulization every 6 (six) hours as needed for wheezing or shortness of breath.  Dispense: 150 mL; Refill: 0  5. Chronic pain of both shoulders - Ambulatory referral to Orthopedics

## 2018-03-25 NOTE — Patient Instructions (Signed)
[address], [place], [place] [address] [phone]

## 2018-03-26 ENCOUNTER — Other Ambulatory Visit: Payer: Managed Care, Other (non HMO)

## 2018-04-18 ENCOUNTER — Other Ambulatory Visit: Payer: Self-pay | Admitting: Family Medicine

## 2018-04-18 DIAGNOSIS — R059 Cough, unspecified: Secondary | ICD-10-CM

## 2018-04-18 DIAGNOSIS — R05 Cough: Secondary | ICD-10-CM

## 2018-04-24 ENCOUNTER — Other Ambulatory Visit: Payer: Managed Care, Other (non HMO)

## 2018-06-25 ENCOUNTER — Ambulatory Visit
Admission: RE | Admit: 2018-06-25 | Discharge: 2018-06-25 | Disposition: A | Discharge status: Home / Self Care | Payer: Managed Care, Other (non HMO) | Source: Ambulatory Visit | Attending: Family Medicine | Admitting: Family Medicine

## 2018-06-25 ENCOUNTER — Other Ambulatory Visit: Payer: Self-pay

## 2018-06-25 ENCOUNTER — Encounter: Payer: Self-pay | Admitting: Family Medicine

## 2018-06-25 ENCOUNTER — Ambulatory Visit (INDEPENDENT_AMBULATORY_CARE_PROVIDER_SITE_OTHER): Payer: Managed Care, Other (non HMO) | Admitting: Family Medicine

## 2018-06-25 VITALS — Wt 269.0 lb

## 2018-06-25 DIAGNOSIS — R748 Abnormal levels of other serum enzymes: Secondary | ICD-10-CM

## 2018-06-25 DIAGNOSIS — M792 Neuralgia and neuritis, unspecified: Secondary | ICD-10-CM | POA: Insufficient documentation

## 2018-06-25 DIAGNOSIS — Z13820 Encounter for screening for osteoporosis: Secondary | ICD-10-CM | POA: Diagnosis not present

## 2018-06-25 DIAGNOSIS — E7800 Pure hypercholesterolemia, unspecified: Secondary | ICD-10-CM

## 2018-06-25 DIAGNOSIS — K219 Gastro-esophageal reflux disease without esophagitis: Secondary | ICD-10-CM | POA: Diagnosis not present

## 2018-06-25 DIAGNOSIS — R1013 Epigastric pain: Secondary | ICD-10-CM | POA: Diagnosis not present

## 2018-06-25 DIAGNOSIS — R05 Cough: Secondary | ICD-10-CM

## 2018-06-25 DIAGNOSIS — M47812 Spondylosis without myelopathy or radiculopathy, cervical region: Secondary | ICD-10-CM | POA: Insufficient documentation

## 2018-06-25 DIAGNOSIS — M5020 Other cervical disc displacement, unspecified cervical region: Secondary | ICD-10-CM | POA: Insufficient documentation

## 2018-06-25 DIAGNOSIS — R059 Cough, unspecified: Secondary | ICD-10-CM

## 2018-06-25 MED ORDER — ALBUTEROL SULFATE (2.5 MG/3ML) 0.083% IN NEBU: 2.5000 mg | INHALATION_SOLUTION | Freq: Four times a day (QID) | RESPIRATORY_TRACT | 0 refills | Status: DC | PRN

## 2018-06-25 NOTE — Progress Notes (Signed)
Name: Tina Stephenson   MRN: [id number]    DOB: [birth year]   Date:06/25/2018       Progress Note  Subjective  Chief Complaint  Chief Complaint  Patient presents with  . Cough    has noticied when she drinks cold stuff  . Gastroesophageal Reflux    I connected with  Tina Stephenson  on 06/25/18 at  1:20 PM EDT by a video enabled telemedicine application and verified that I am speaking with the correct person using two identifiers.  I discussed the limitations of evaluation and management by telemedicine and the availability of in person appointments. The patient expressed understanding and agreed to proceed. Staff also discussed with the patient that there may be a patient responsible charge related to this service. Patient Location: Home Provider Location: Office Additional Individuals present: None  HPI  Cough and Epigastric discomfort: She notes ongoing cough for about 2 weeks.  Was treated in March 2020 for bronchitis and did recover completely from this after taking symbicort.  She has noticed this is triggered by very cold beverages or when her chest feels cold.  She is not having shortness of breath at rest, but does have some with exertion. Feels like she always has a little tickle in her throat.She has noticed an increase in her heartburn lately as well.  Her discomfort is mostly in the epigastric area and in the throat, having some regurgitation. No difficulty swallowing, no abdominal pain or tenderness, no nausea.    Patient Active Problem List   Diagnosis Date Noted  . History of migraine 03/25/2018  . Scoliosis 03/25/2018  . History of cyst of breast 03/25/2018  . Sacrum and coccyx fracture, sequela 02/21/2018  . Class 3 severe obesity due to excess calories without serious comorbidity with body mass index (BMI) of 45.0 to 49.9 in adult ([place]) 02/21/2018  . Cough 02/21/2018  . Onychomycosis 02/21/2018  . History of hypertension 02/21/2018  . Vitamin D deficiency 03/19/2015     Social History   Tobacco Use  . Smoking status: Never Smoker  . Smokeless tobacco: Never Used  Substance Use Topics  . Alcohol use: Never    Frequency: Never     Current Outpatient Medications:  .  albuterol (PROVENTIL) (2.5 MG/3ML) 0.083% nebulizer solution, Take 3 mLs (2.5 mg total) by nebulization every 6 (six) hours as needed for wheezing or shortness of breath. (Patient not taking: Reported on 06/25/2018), Disp: 150 mL, Rfl: 0 .  benzonatate (TESSALON) 100 MG capsule, Take 1 capsule by mouth three times daily as needed (Patient not taking: Reported on 06/25/2018), Disp: 20 capsule, Rfl: 0 .  budesonide-formoterol (SYMBICORT) 80-4.5 MCG/ACT inhaler, Inhale 2 puffs into the lungs 2 (two) times daily. (Patient not taking: Reported on 06/25/2018), Disp: 1 Inhaler, Rfl: 3 .  oxyCODONE-acetaminophen (PERCOCET/ROXICET) 5-325 MG tablet, oxycodone-acetaminophen 5 mg-325 mg tablet, Disp: , Rfl:   Allergies  Allergen Reactions  . Penicillins Other (See Comments)    "lose my hearing"    I personally reviewed active problem list, medication list, allergies, notes from last encounter, lab results with the patient/caregiver today.  ROS  Ten systems reviewed and is negative except as mentioned in HPI  Objective  Virtual encounter, vitals not obtained.  Body mass index is 46.17 kg/m.  Nursing Note and Vital Signs reviewed.  Physical Exam  Constitutional: Patient appears well-developed and well-nourished. No distress.  HENT: Head: Normocephalic and atraumatic.  Neck: Normal range of motion. Pulmonary/Chest: Effort normal. No respiratory distress.  Speaking in complete sentences Neurological: Pt is alert and oriented to person, place, and time. Coordination, speech are normal.  Psychiatric: Patient has a normal mood and affect. behavior is normal. Judgment and thought content normal.  No results found for this or any previous visit (from the past 72 hour(s)).  Assessment & Plan   1. Cough - albuterol (PROVENTIL) (2.5 MG/3ML) 0.083% nebulizer solution; Take 3 mLs (2.5 mg total) by nebulization every 6 (six) hours as needed for wheezing or shortness of breath.  Dispense: 150 mL; Refill: 0 - H. pylori breath test  2. Epigastric pain - H. pylori breath test  3. Gastroesophageal reflux disease without esophagitis - H. pylori breath test  4. Elevated alkaline phosphatase level - Comprehensive metabolic panel  5. Elevated LDL cholesterol level - Lipid panel  -Red flags and when to present for emergency care or RTC including fever >101.62F, chest pain, shortness of breath, new/worsening/un-resolving symptoms, reviewed with patient at time of visit. Follow up and care instructions discussed and provided in AVS. - I discussed the assessment and treatment plan with the patient. The patient was provided an opportunity to ask questions and all were answered. The patient agreed with the plan and demonstrated an understanding of the instructions.  I provided 18 minutes of non-face-to-face time during this encounter.  Hubbard Hartshorn, FNP

## 2018-06-26 LAB — COMPREHENSIVE METABOLIC PANEL
ALT: 20 IU/L (ref 0–32)
AST: 20 IU/L (ref 0–40)
Albumin/Globulin Ratio: 2 (ref 1.2–2.2)
Albumin: 4.7 g/dL (ref 3.8–4.8)
Alkaline Phosphatase: 119 IU/L — ABNORMAL HIGH (ref 39–117)
BUN/Creatinine Ratio: 14 (ref 12–28)
BUN: 11 mg/dL (ref 8–27)
Bilirubin Total: 1.6 mg/dL — ABNORMAL HIGH (ref 0.0–1.2)
CO2: 24 mmol/L (ref 20–29)
Calcium: 9.5 mg/dL (ref 8.7–10.3)
Chloride: 103 mmol/L (ref 96–106)
Creatinine, Ser: 0.76 mg/dL (ref 0.57–1.00)
GFR calc Af Amer: 98 mL/min/{1.73_m2} (ref >59)
GFR calc non Af Amer: 85 mL/min/{1.73_m2} (ref >59)
Globulin, Total: 2.3 g/dL (ref 1.5–4.5)
Glucose: 87 mg/dL (ref 65–99)
Potassium: 4.2 mmol/L (ref 3.5–5.2)
Sodium: 141 mmol/L (ref 134–144)
Total Protein: 7 g/dL (ref 6.0–8.5)

## 2018-06-26 LAB — LIPID PANEL
Chol/HDL Ratio: 3.9 ratio (ref 0.0–4.4)
Cholesterol, Total: 209 mg/dL — ABNORMAL HIGH (ref 100–199)
HDL: 54 mg/dL (ref >39)
LDL Calculated: 132 mg/dL — ABNORMAL HIGH (ref 0–99)
Triglycerides: 116 mg/dL (ref 0–149)
VLDL Cholesterol Cal: 23 mg/dL (ref 5–40)

## 2018-06-26 LAB — H. PYLORI BREATH TEST: H pylori Breath Test: NEGATIVE

## 2018-06-27 ENCOUNTER — Encounter: Payer: Self-pay | Admitting: Family Medicine

## 2018-06-28 ENCOUNTER — Other Ambulatory Visit: Payer: Self-pay

## 2018-06-28 ENCOUNTER — Other Ambulatory Visit: Payer: Self-pay | Admitting: Family Medicine

## 2018-06-28 ENCOUNTER — Telehealth: Payer: Self-pay | Admitting: Family Medicine

## 2018-06-28 ENCOUNTER — Encounter: Payer: Self-pay | Admitting: Family Medicine

## 2018-06-28 DIAGNOSIS — K219 Gastro-esophageal reflux disease without esophagitis: Secondary | ICD-10-CM

## 2018-06-28 DIAGNOSIS — R748 Abnormal levels of other serum enzymes: Secondary | ICD-10-CM

## 2018-06-28 DIAGNOSIS — R05 Cough: Secondary | ICD-10-CM

## 2018-06-28 DIAGNOSIS — R1013 Epigastric pain: Secondary | ICD-10-CM

## 2018-06-28 DIAGNOSIS — R059 Cough, unspecified: Secondary | ICD-10-CM

## 2018-06-28 MED ORDER — OMEPRAZOLE 20 MG PO CPDR: 20.0000 mg | DELAYED_RELEASE_CAPSULE | Freq: Two times a day (BID) | ORAL | 1 refills | Status: DC

## 2018-06-28 NOTE — Telephone Encounter (Signed)
Pt.notified

## 2018-06-28 NOTE — Telephone Encounter (Signed)
Copied from [place] [id number]. Topic: General - Other >> Jun 28, 2018  9:52 AM Oneta Rack wrote: Patient inquiring about lab results, please advise

## 2018-07-01 ENCOUNTER — Other Ambulatory Visit: Payer: Self-pay | Admitting: Family Medicine

## 2018-07-01 DIAGNOSIS — R1013 Epigastric pain: Secondary | ICD-10-CM

## 2018-07-01 DIAGNOSIS — K219 Gastro-esophageal reflux disease without esophagitis: Secondary | ICD-10-CM

## 2018-07-01 DIAGNOSIS — R748 Abnormal levels of other serum enzymes: Secondary | ICD-10-CM

## 2018-07-03 LAB — ALKALINE PHOSPHATASE, ISOENZYMES
Alkaline Phosphatase: 122 IU/L — ABNORMAL HIGH (ref 39–117)
BONE FRACTION: 32 % (ref 14–68)
INTESTINAL FRAC.: 2 % (ref 0–18)
LIVER FRACTION: 66 % (ref 18–85)

## 2018-07-03 LAB — SPECIMEN STATUS REPORT

## 2018-07-24 ENCOUNTER — Other Ambulatory Visit: Payer: Self-pay

## 2018-07-24 ENCOUNTER — Ambulatory Visit (INDEPENDENT_AMBULATORY_CARE_PROVIDER_SITE_OTHER): Payer: Managed Care, Other (non HMO) | Admitting: Gastroenterology

## 2018-07-24 ENCOUNTER — Telehealth: Payer: Self-pay | Admitting: Gastroenterology

## 2018-07-24 ENCOUNTER — Telehealth: Payer: Self-pay

## 2018-07-24 DIAGNOSIS — K582 Mixed irritable bowel syndrome: Secondary | ICD-10-CM

## 2018-07-24 DIAGNOSIS — R7989 Other specified abnormal findings of blood chemistry: Secondary | ICD-10-CM

## 2018-07-24 DIAGNOSIS — R945 Abnormal results of liver function studies: Secondary | ICD-10-CM | POA: Diagnosis not present

## 2018-07-24 NOTE — Telephone Encounter (Signed)
Spoke with pt and explained that she'll need to visit the lab to complete lab tests ordered during her visit today. I also explained that we'll need to schedule the abdominal ultrasound. Pt states she'll have to contact our office next week to schedule the ultrasound once she receives her work schedule.

## 2018-07-24 NOTE — Telephone Encounter (Signed)
Patient had visit with Dr Vicente Males today(virtual) &  would like to know if she needs to take calcium?

## 2018-07-24 NOTE — Progress Notes (Signed)
Tina Stephenson  [address]  [place]  [place], [place] [address]  Main: [phone]  Fax: [phone]   Gastroenterology Consultation  Referring Provider:     Hubbard Hartshorn, FNP Primary Care Physician:  Hubbard Hartshorn, FNP Reason for Consultation:     Abdominal pain and elevated LFT's         HPI:   Virtual Visit via video  Note  I connected with patient on 07/24/18 at  9:30 AM EDT by video  and verified that I am speaking with the correct person using two identifiers.   I discussed the limitations, risks, security and privacy concerns of performing an evaluation and management service by video and the availability of in person appointments. I also discussed with the patient that there may be a patient responsible charge related to this service. The patient expressed understanding and agreed to proceed.  Location of the patient: Home Location of provider: Home Participating persons: Patient and provider only   History of Present Illness: Chief Complaint  Patient presents with   Abdominal Pain    Acid Reflux     Tina Stephenson is a 63 y.o. y/o female referred for consultation & management  by Dr. Uvaldo Rising, Astrid Divine, FNP.    She recalls had a "cold " in 11/2017 - had bronchitis- treated 3 times and finally cleared up . Was coughing hence labs were ordered . When she drinks cold water she coughs,   Abdominal pain: Onset: 1 month back  Site :RUQ, sometimes all day and sometimes on and off  Radiation: localized  Severity :8/10 at times  Petra Kuba of pain: like a knife inside  Aggravating factors: unsure  Relieving factors :nothing - worse when she eats usually 30 mins after she eats  Weight loss: trying to but not  NSAID use: no  PPI use occasionaly heartburn - on omeprazole 20 mg -  With food  Gall bladder surgery: intact  Frequency of bowel movements: several times or atleast 1-2 times a day - soft Change in bowel movements: lot more softer - different color- lighter   Relief with bowel movements: yes  Gas/Bloating/Abdominal distension: yes   Last colonoscopy in 2016 - no family , no family history of colon polyps or cancer    06/25/2018: H. pylori breath test: Negative  Alkaline phosphatase 122 fractionated appears normal with individual components.  Upper limit of normal of alkaline phosphatase is 117.  Has been very minimally elevated over the last few months.  With normal transaminases, albumin, total bilirubin   Past Medical History:  Diagnosis Date   Depression    Hypertension    Hypertensive disorder 03/05/2015   Sacrum and coccyx fracture [facility])     Past Surgical History:  Procedure Laterality Date   APPENDECTOMY     BREAST CYST EXCISION Left    x2   BREAST CYST EXCISION Right    unable to see scar   NASAL SEPTUM SURGERY     WISDOM TOOTH EXTRACTION      Prior to Admission medications   Medication Sig Start Date End Date Taking? Authorizing Provider  albuterol (PROVENTIL) (2.5 MG/3ML) 0.083% nebulizer solution Take 3 mLs (2.5 mg total) by nebulization every 6 (six) hours as needed for wheezing or shortness of breath. 06/25/18  Yes Hubbard Hartshorn, FNP  omeprazole (PRILOSEC) 20 MG capsule Take 1 capsule (20 mg total) by mouth 2 (two) times daily before a meal. 06/28/18  Yes Hubbard Hartshorn, FNP  oxyCODONE-acetaminophen (PERCOCET/ROXICET) 5-325 MG tablet oxycodone-acetaminophen 5 mg-325 mg tablet    [provider]    Family History  Problem Relation Age of Onset   COPD Mother    Cancer Mother    Heart disease Father    Breast cancer Paternal Aunt      Social History   Tobacco Use   Smoking status: Never Smoker   Smokeless tobacco: Never Used  Substance Use Topics   Alcohol use: Never    Frequency: Never   Drug use: Never    Allergies as of 07/24/2018 - Review Complete 07/24/2018  Allergen Reaction Noted   Penicillins Other (See Comments) 04/14/2017    Review of Systems:    All systems reviewed  and negative except where noted in HPI. General Appearance:    Alert, cooperative, no distress, appears stated age  Head:    Normocephalic, without obvious abnormality, atraumatic  Eyes:    PERRL, conjunctiva/corneas clear,  Ears:    Grossly normal hearing    Neurologic:   Grossly appears normal     Observations/Objective:  Labs: CBC    Component Value Date/Time   WBC 8.5 02/21/2018 1454   RBC 4.50 02/21/2018 1454   HGB 12.8 02/21/2018 1454   HCT 37.9 02/21/2018 1454   PLT 320 02/21/2018 1454   MCV 84 02/21/2018 1454   MCH 28.4 02/21/2018 1454   MCHC 33.8 02/21/2018 1454   RDW 13.6 02/21/2018 1454   LYMPHSABS 3.1 02/21/2018 1454   EOSABS 0.1 02/21/2018 1454   BASOSABS 0.1 02/21/2018 1454   CMP     Component Value Date/Time   NA 141 06/25/2018 1456   K 4.2 06/25/2018 1456   CL 103 06/25/2018 1456   CO2 24 06/25/2018 1456   GLUCOSE 87 06/25/2018 1456   BUN 11 06/25/2018 1456   CREATININE 0.76 06/25/2018 1456   CALCIUM 9.5 06/25/2018 1456   PROT 7.0 06/25/2018 1456   ALBUMIN 4.7 06/25/2018 1456   AST 20 06/25/2018 1456   ALT 20 06/25/2018 1456   ALKPHOS 119 (H) 06/25/2018 1456   ALKPHOS 122 (H) 06/25/2018 1456   BILITOT 1.6 (H) 06/25/2018 1456   GFRNONAA 85 06/25/2018 1456   GFRAA 98 06/25/2018 1456    Imaging Studies: Dg Bone Density  Result Date: 06/25/2018 EXAM: DUAL X-RAY ABSORPTIOMETRY (DXA) FOR BONE MINERAL DENSITY IMPRESSION: Technologist: SCE PATIENT BIOGRAPHICAL: Name: Tina Stephenson, Tina Stephenson Patient ID: [id number] Birth Date: 1955-07-07 Height: 63.0 in. Gender: Female Exam Date: 06/25/2018 Weight: 269.5 lbs. Indications: Caucasian, Height Loss, Postmenopausal, Scoliosis, Vitamin D Deficiency Fractures: Left foot, Right foot Treatments: ASSESSMENT: The BMD measured at Femur Neck Right is 1.053 g/cm2 with a T-score of 0.1. This patient is considered normal according to [facility]) criteria. The quality of the scan is good. Site Region Measured  Measured WHO Young Adult BMD Date       Age      Classification T-score AP Spine L1-L4 06/25/2018 61.1 Normal 1.4 1.363 g/cm2 DualFemur Neck Right 06/25/2018 61.1 Normal 0.1 1.053 g/cm2 World Health Organization [facility]) criteria for post-menopausal, Caucasian Women: Normal:       T-score at or above -1 SD Osteopenia:   T-score between -1 and -2.5 SD Osteoporosis: T-score at or below -2.5 SD RECOMMENDATIONS: 1. All patients should optimize calcium and vitamin D intake. 2. Consider FDA-approved medical therapies in postmenopausal women and men aged 40 years and older, based on the following: a. A hip or vertebral(clinical or morphometric) fracture b. T-score < -2.5 at  the femoral neck or spine after appropriate evaluation to exclude secondary causes c. Low bone mass (T-score between -1.0 and -2.5 at the femoral neck or spine) and a 10-year probability of a hip fracture > 3% or a 10-year probability of a major osteoporosis-related fracture > 20% based on the [place]-adapted WHO algorithm d. Clinician judgment and/or patient preferences may indicate treatment for people with 10-year fracture probabilities above or below these levels FOLLOW-UP: People with diagnosed cases of osteoporosis or at high risk for fracture should have regular bone mineral density tests. For patients eligible for Medicare, routine testing is allowed once every 2 years. The testing frequency can be increased to one year for patients who have rapidly progressing disease, those who are receiving or discontinuing medical therapy to restore bone mass, or have additional risk factors. Electronically Signed   By: Earle Gell M.D.   On: 06/25/2018 12:22    Assessment and Plan:   Tina Stephenson is a 55 y.o. y/o female has been referred for epigastric pain and abnormal alkaline phosphatase.  The alkaline phosphatase is very minimally elevated with fractionated competence appearing to be normal.  The cutoff at 117 and to just a few points above it.  I think if  we rule out any obstruction with an ultrasound and screen for hepatitis B and C we can comfortably follow the alkaline phosphatase every few months.  If the levels begin to rise then further work-up would be warranted.  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 She also has features of GERD recently made worse by coughing from bronchitis likely some esophagitis.     Plan :   1. Screen for hepatitis B, C, check PTH calcium.  AMA, ANA, smooth muscle antibody, GGT. 2. USG abdomen  3. High fiber diet 30 grams per day - discussed- patient information provided 4. If no better will consider endoscopy/CT abdomen   5. Take omeprazole 20 mg once a day before breakfast - if no better in 1 week increase to BID    I discussed the assessment and treatment plan with the patient. The patient was provided an opportunity to ask questions and all were answered. The patient agreed with the plan and demonstrated an understanding of the instructions.   The patient was advised to call back or seek an in-person evaluation if the symptoms worsen or if the condition fails to improve as anticipated.  F/u in 6 weeks at office   Dr Tina Bellows MD,MRCP [facility]) Gastroenterology/Hepatology Pager: [phone]   Speech recognition software was used to dictate the above note.

## 2018-07-25 NOTE — Telephone Encounter (Signed)
Awaiting labs- once we have results will tell her .   Dr Jonathon Bellows MD,MRCP [facility]) Gastroenterology/Hepatology Pager: [phone]

## 2018-07-25 NOTE — Telephone Encounter (Signed)
Pt is calling to obtain information about her u/s please call her

## 2018-07-29 NOTE — Telephone Encounter (Signed)
Patient called & states she needs the u/s on this Thursday & Friday if possible in the am or next week. Please call & l/m on her voice mail.

## 2018-07-29 NOTE — Telephone Encounter (Signed)
Called pt regarding her ultrasound appointment.  Unable to contact, LVM to return call

## 2018-07-29 NOTE — Telephone Encounter (Signed)
Spoke with pt and informed her of the ultrasound appointment information.

## 2018-08-02 ENCOUNTER — Ambulatory Visit
Admission: RE | Admit: 2018-08-02 | Discharge: 2018-08-02 | Disposition: A | Discharge status: Home / Self Care | Payer: Managed Care, Other (non HMO) | Source: Ambulatory Visit | Attending: Gastroenterology | Admitting: Gastroenterology

## 2018-08-02 ENCOUNTER — Other Ambulatory Visit: Payer: Self-pay

## 2018-08-02 DIAGNOSIS — K582 Mixed irritable bowel syndrome: Secondary | ICD-10-CM | POA: Insufficient documentation

## 2018-08-02 DIAGNOSIS — R7989 Other specified abnormal findings of blood chemistry: Secondary | ICD-10-CM

## 2018-08-02 DIAGNOSIS — R945 Abnormal results of liver function studies: Secondary | ICD-10-CM | POA: Insufficient documentation

## 2018-08-02 IMAGING — US ULTRASOUND ABDOMEN COMPLETE
1 series · 13 of 25 positions shown · non-contrast
Comparison: None.
COMPARISON: None.

Addendum:
CLINICAL DATA: Abdominal pain.  Abnormal liver function test.

EXAM:
ABDOMEN ULTRASOUND COMPLETE

[Series 1: ultrasound abdomen complete · 13 of 81 slices shown]
[im 1/81]
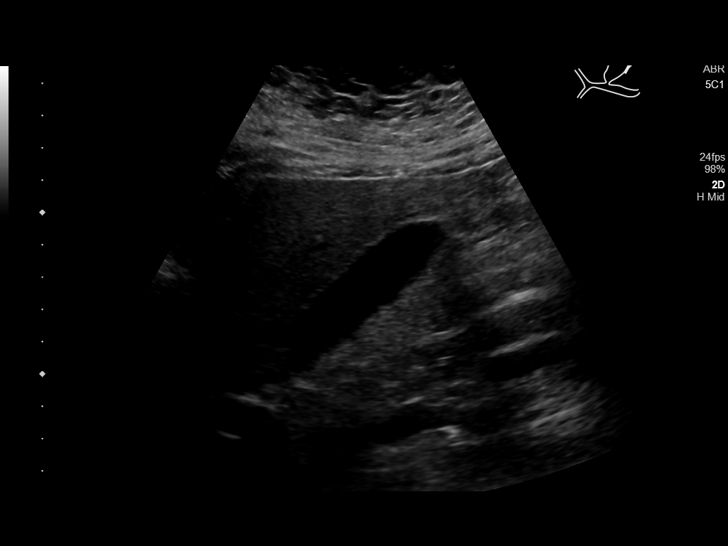
[im 7/81]
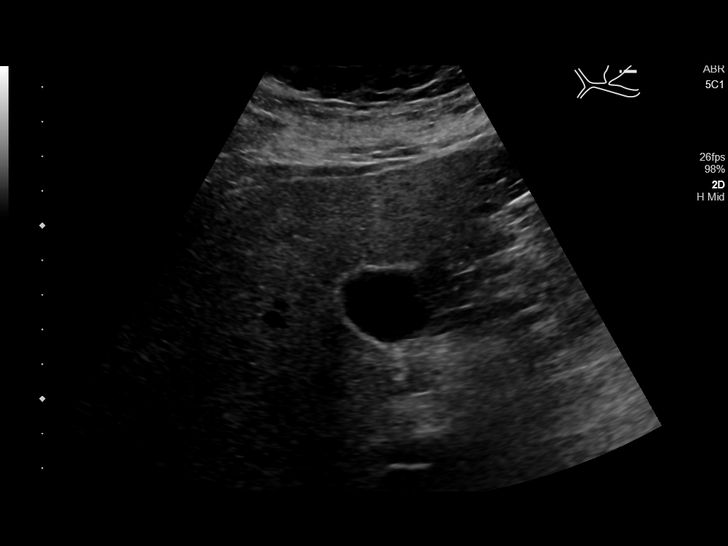
[im 14/81]
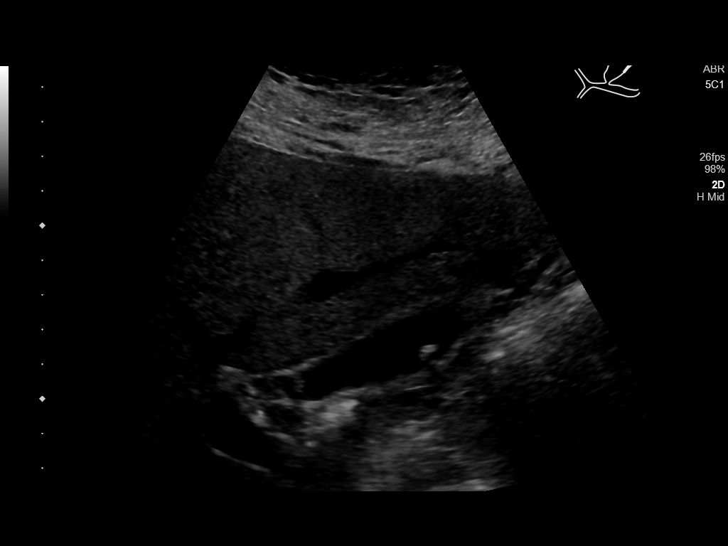
[im 21/81]
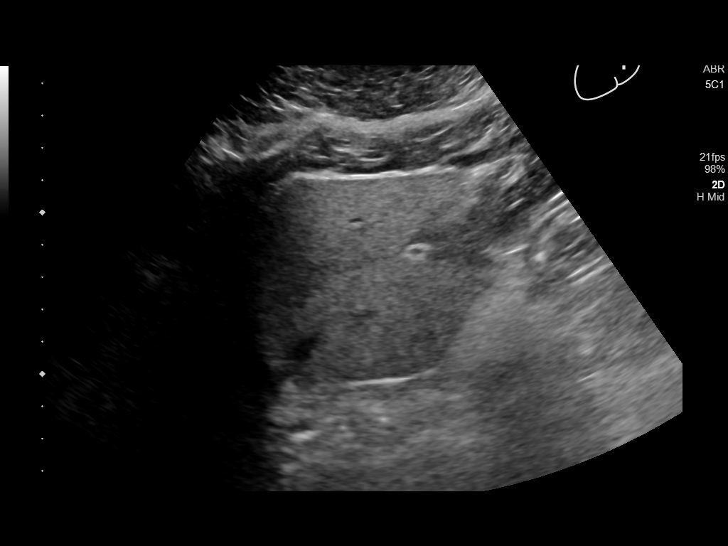
[im 27/81]
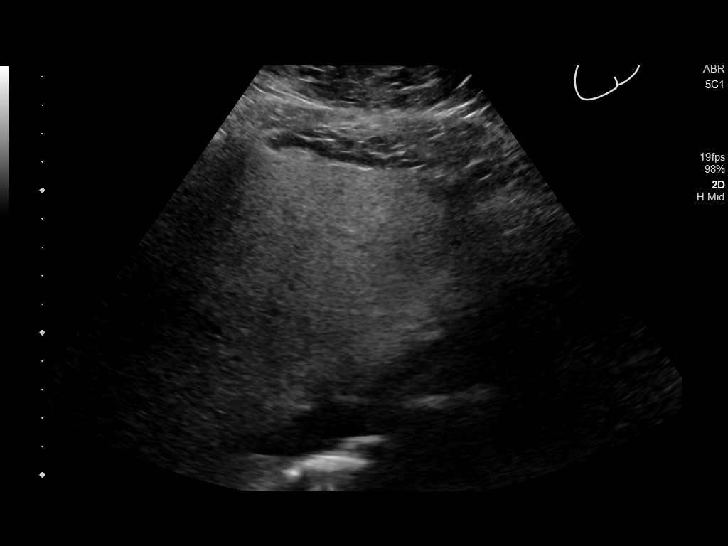
[im 34/81]
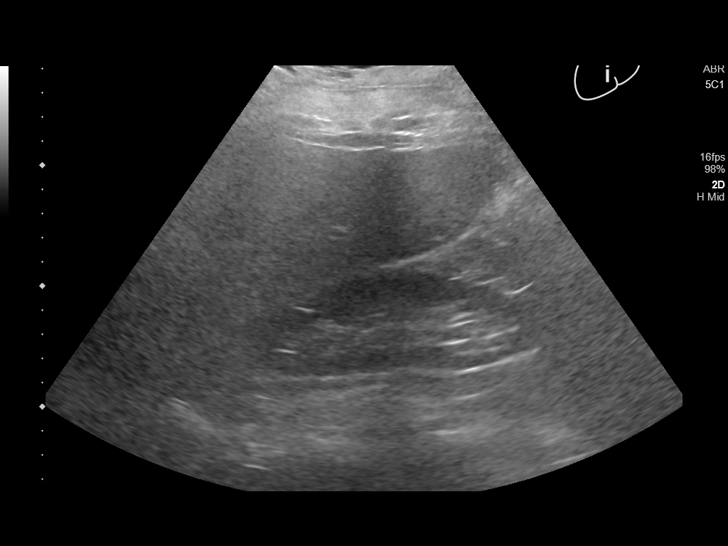
[im 41/81]
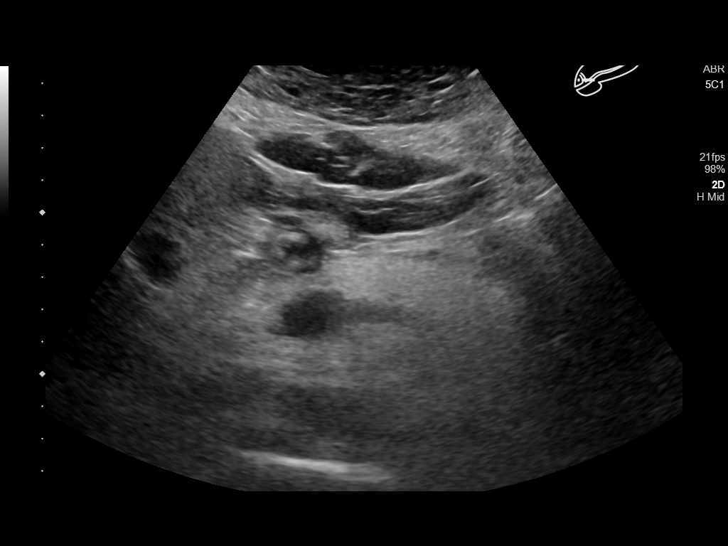
[im 47/81]
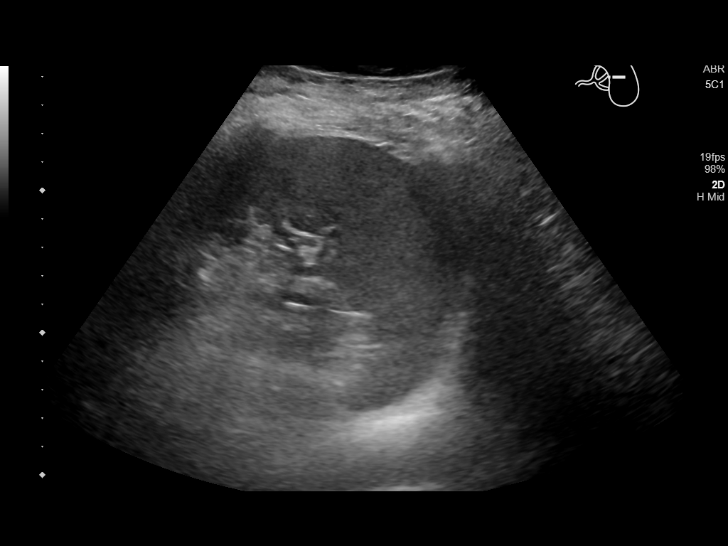
[im 54/81]
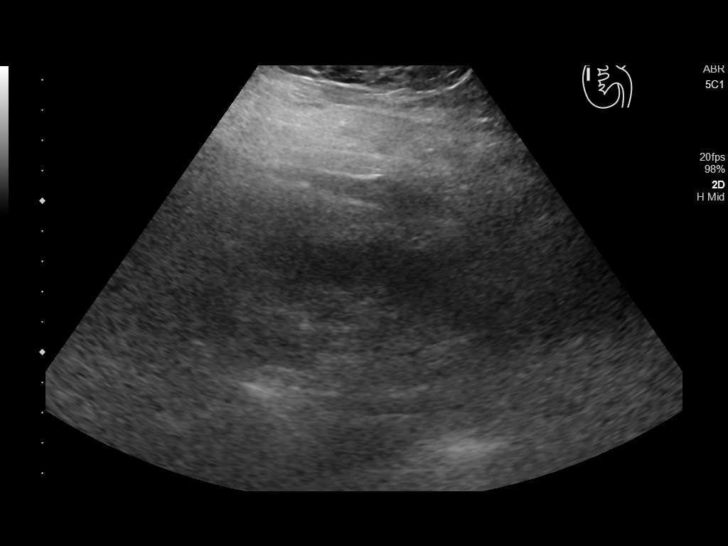
[im 61/81]
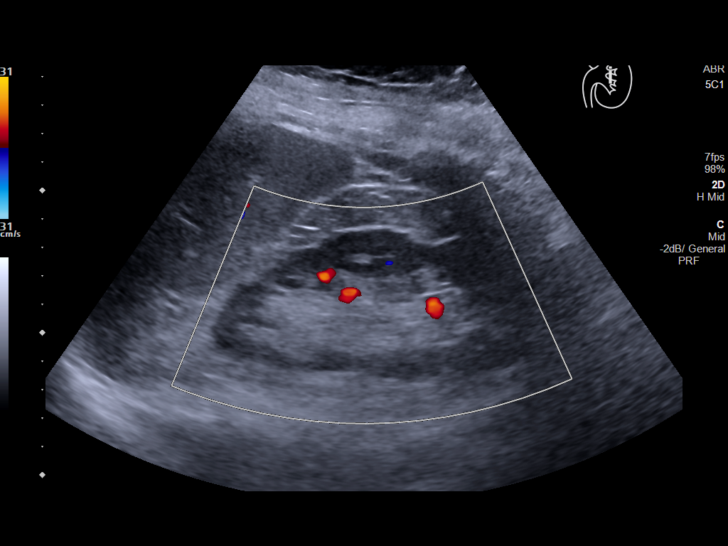
[im 67/81]
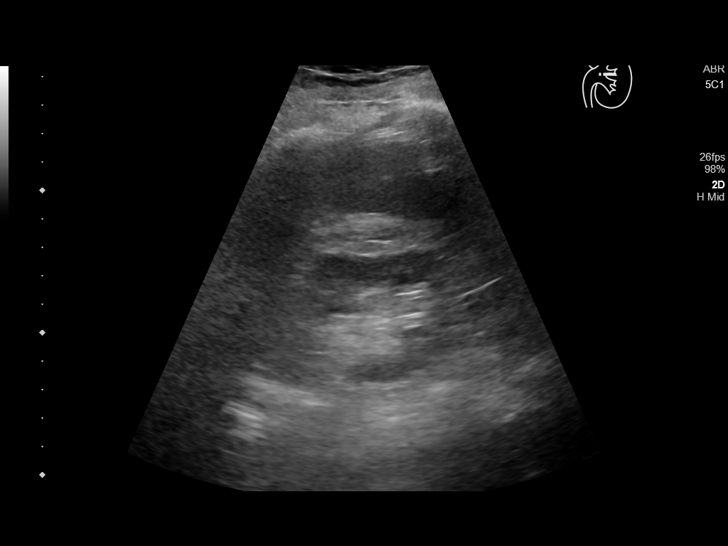
[im 74/81]
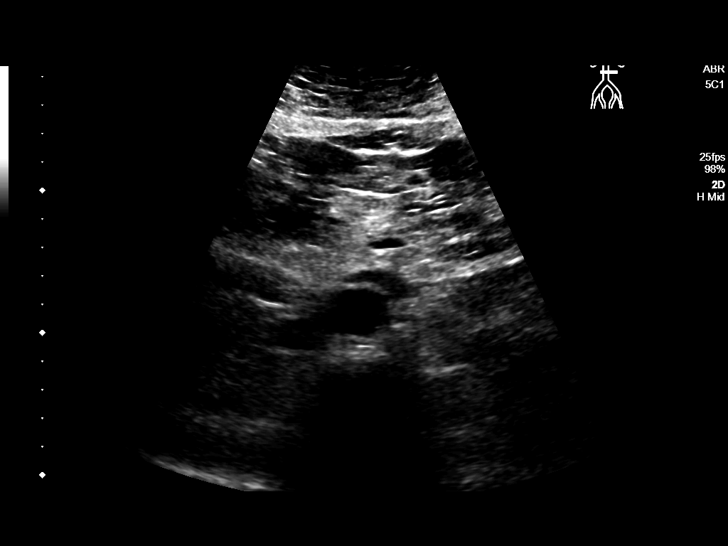
[im 81/81]
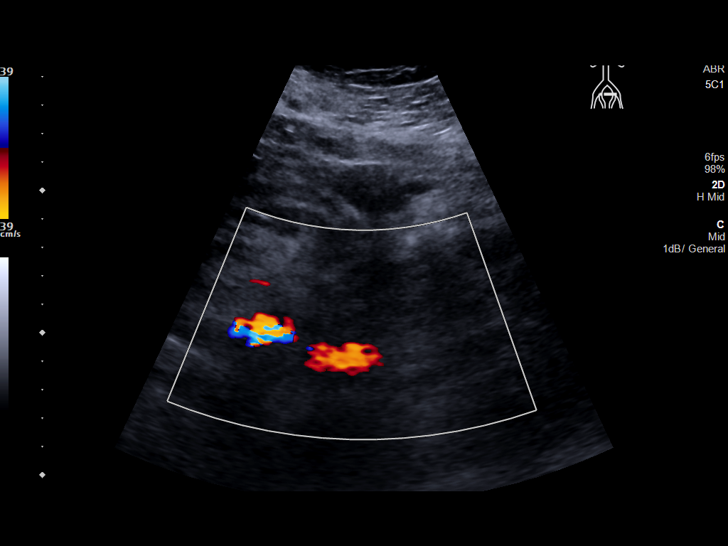

[13 of 25 positions shown; findings below may reference images not displayed]

FINDINGS: Gallbladder: No shadowing stones. Echogenic focus lies along 1 wall
measuring 4 mm consistent with a polyp. No wall thickening. No
pericholecystic fluid.

Common bile duct: Diameter: 3 mm

Liver: Increased parenchymal echogenicity with decreased through
transmission of the sound beam. Area of relative decreased
echogenicity along the left lobe measuring 2.6 cm, likely focal
fatty sparing. No convincing masses or other lesions. Portal vein is
patent on color Doppler imaging with normal direction of blood flow
towards the liver.

IVC: No abnormality visualized.

Pancreas: Incompletely visualized.  Portions seen are unremarkable.

Spleen: Size and appearance within normal limits.

Right Kidney: Length: 13.3 cm. Echogenicity within normal limits. No
mass or hydronephrosis visualized.

Left Kidney: Length: 11.5 cm.  There is

Abdominal aorta: No aneurysm visualized.

Other findings: None.
IMPRESSION: 1. No acute findings.
2. Mm gallbladder polyp.
3. Increased liver parenchymal echogenicity consistent with hepatic
steatosis. Probable focal fatty sparing along the left lobe.

ADDENDUM:
In the body of the dictation under the left kidney, after the
measurement, it should read "normal parenchymal echogenicity. No
masses, stones or hydronephrosis".

*** End of Addendum ***
FINDINGS: Gallbladder: No shadowing stones. Echogenic focus lies along 1 wall
measuring 4 mm consistent with a polyp. No wall thickening. No
pericholecystic fluid.

Common bile duct: Diameter: 3 mm

Liver: Increased parenchymal echogenicity with decreased through
transmission of the sound beam. Area of relative decreased
echogenicity along the left lobe measuring 2.6 cm, likely focal
fatty sparing. No convincing masses or other lesions. Portal vein is
patent on color Doppler imaging with normal direction of blood flow
towards the liver.

IVC: No abnormality visualized.

Pancreas: Incompletely visualized.  Portions seen are unremarkable.

Spleen: Size and appearance within normal limits.

Right Kidney: Length: 13.3 cm. Echogenicity within normal limits. No
mass or hydronephrosis visualized.

Left Kidney: Length: 11.5 cm.  There is

Abdominal aorta: No aneurysm visualized.

Other findings: None.
IMPRESSION: 1. No acute findings.
2. Mm gallbladder polyp.
3. Increased liver parenchymal echogenicity consistent with hepatic
steatosis. Probable focal fatty sparing along the left lobe.

## 2018-08-04 ENCOUNTER — Encounter: Payer: Self-pay | Admitting: Gastroenterology

## 2018-08-06 ENCOUNTER — Telehealth: Payer: Self-pay

## 2018-08-06 LAB — HEPATITIS B E ANTIGEN: Hep B E Ag: NEGATIVE

## 2018-08-06 LAB — GAMMA GT: GGT: 29 IU/L (ref 0–60)

## 2018-08-06 LAB — HEPATITIS B SURFACE ANTIGEN: Hepatitis B Surface Ag: NEGATIVE

## 2018-08-06 LAB — MITOCHONDRIAL/SMOOTH MUSCLE AB PNL
Mitochondrial Ab: <20 Units (ref 0.0–20.0)
Smooth Muscle Ab: 3 Units (ref 0–19)

## 2018-08-06 LAB — PTH, INTACT AND CALCIUM
Calcium: 9.3 mg/dL (ref 8.7–10.3)
PTH: 37 pg/mL (ref 15–65)

## 2018-08-06 LAB — HEPATITIS C ANTIBODY: Hep C Virus Ab: <0.1 s/co ratio (ref 0.0–0.9)

## 2018-08-06 LAB — TSH: TSH: 1.94 u[IU]/mL (ref 0.450–4.500)

## 2018-08-06 NOTE — Telephone Encounter (Signed)
Spoke with pt and informed her of ultrasound results and Dr. Georgeann Oppenheim instructions to repeat in 1 year.

## 2018-08-06 NOTE — Telephone Encounter (Signed)
-----   Message from Jonathon Bellows, MD sent at 08/04/2018  6:19 PM EDT ----- Sherald Hess inform fatty liver seen on USG and tiny gall bladder polyp- repeat in 1 year to ensure polyp size is stable  C.c Hubbard Hartshorn, FNP

## 2018-08-08 ENCOUNTER — Telehealth: Payer: Self-pay | Admitting: Gastroenterology

## 2018-08-08 NOTE — Telephone Encounter (Signed)
Kim with U/S at [place] ON THE U/S 08-02-18

## 2018-08-22 ENCOUNTER — Encounter: Payer: Self-pay | Admitting: Family Medicine

## 2018-08-22 ENCOUNTER — Ambulatory Visit (INDEPENDENT_AMBULATORY_CARE_PROVIDER_SITE_OTHER): Payer: Managed Care, Other (non HMO) | Admitting: Family Medicine

## 2018-08-22 ENCOUNTER — Other Ambulatory Visit: Payer: Self-pay

## 2018-08-22 VITALS — BP 140/86 | HR 70 | Temp 96.8°F | Resp 16 | Ht 62.5 in | Wt 265.7 lb

## 2018-08-22 DIAGNOSIS — R351 Nocturia: Secondary | ICD-10-CM

## 2018-08-22 DIAGNOSIS — Z124 Encounter for screening for malignant neoplasm of cervix: Secondary | ICD-10-CM | POA: Diagnosis not present

## 2018-08-22 DIAGNOSIS — E119 Type 2 diabetes mellitus without complications: Secondary | ICD-10-CM | POA: Insufficient documentation

## 2018-08-22 DIAGNOSIS — E785 Hyperlipidemia, unspecified: Secondary | ICD-10-CM | POA: Diagnosis not present

## 2018-08-22 DIAGNOSIS — Z6841 Body Mass Index (BMI) 40.0 and over, adult: Secondary | ICD-10-CM

## 2018-08-22 DIAGNOSIS — Z131 Encounter for screening for diabetes mellitus: Secondary | ICD-10-CM | POA: Diagnosis not present

## 2018-08-22 DIAGNOSIS — Z23 Encounter for immunization: Secondary | ICD-10-CM | POA: Diagnosis not present

## 2018-08-22 DIAGNOSIS — Z114 Encounter for screening for human immunodeficiency virus [HIV]: Secondary | ICD-10-CM

## 2018-08-22 DIAGNOSIS — R7303 Prediabetes: Secondary | ICD-10-CM

## 2018-08-22 DIAGNOSIS — N841 Polyp of cervix uteri: Secondary | ICD-10-CM | POA: Insufficient documentation

## 2018-08-22 DIAGNOSIS — Z01419 Encounter for gynecological examination (general) (routine) without abnormal findings: Secondary | ICD-10-CM

## 2018-08-22 DIAGNOSIS — L304 Erythema intertrigo: Secondary | ICD-10-CM | POA: Insufficient documentation

## 2018-08-22 HISTORY — DX: Polyp of cervix uteri: N84.1

## 2018-08-22 HISTORY — DX: Nocturia: R35.1

## 2018-08-22 LAB — POCT GLYCOSYLATED HEMOGLOBIN (HGB A1C): Hemoglobin A1C: 6.3 % — AB (ref 4.0–5.6)

## 2018-08-22 NOTE — Patient Instructions (Signed)

## 2018-08-22 NOTE — Progress Notes (Signed)
Name: Tina Stephenson   MRN: [id number]    DOB: [birth year]   Date:08/22/2018       Progress Note  Subjective  Chief Complaint  Chief Complaint  Patient presents with  . Annual Exam    HPI   Patient presents for annual CPE   Nocturia and urinary stress incontinence: discussed sleep study, ESS is normal , she does not snore, advised to empty bladder and work on Kegel exercise  Morbid obesity: discussed diet and exercise  Cervical polyp: during exam, she was very uncomfortable and unable to removed it, she has never been sexually active, discussed importance of referral to gyn, she will think about it and discuss it with Raelyn Ensign, she is seeing GI now for recurrent RUQ pain and may need surgery for gallbladder polyp removal  Rash under both breasts, discussed clotrimazole and hydrocortisone otc prn   Pre-diabetes: A1C at 6.3% discussed importance of life style modification and possible medications if she decides to take it  Diet: she eats lean meat, she cannot tolerate fish/shelfish, lots of fruit and vegetables, also has high calcium diet , avoiding sodas and fast food  Exercise: 150 minutes per week, continue exercising on a regular basis   USPSTF grade A and B recommendations    Office Visit from 08/22/2018 in [facility]  AUDIT-C Score  0    Hypertension: BP Readings from Last 3 Encounters:  08/22/18 140/86  03/25/18 140/80  02/21/18 124/82   Obesity: Wt Readings from Last 3 Encounters:  08/22/18 265 lb 11.2 oz (120.5 kg)  06/25/18 269 lb (122 kg)  03/25/18 263 lb 6.4 oz (119.5 kg)   BMI Readings from Last 3 Encounters:  08/22/18 47.82 kg/m  06/25/18 46.17 kg/m  03/25/18 45.21 kg/m    Hep C Screening: done 07/2018 STD testing and prevention (HIV/chl/gon/syphilis): declined  Intimate partner violence: negative screen  Sexual History/Pain during Intercourse: not sexually active - never  Menstrual History/LMP/Abnormal Bleeding:  post-menopausal  Incontinence Symptoms: she has stress incontinence with hard cough mild , nocturia times 2   Advanced Care Planning: A voluntary discussion about advance care planning including the explanation and discussion of advance directives.  Discussed health care proxy and Living will, and the patient was able to identify a health care proxy as older brother .  Patient does not have a living will at present time.   Breast cancer: normal   BRCA gene screening: N/A Cervical cancer screening: today, never sexually active and we may consider discontinuing after today   Osteoporosis Screening: normal 06/2018  Lipids:  Lab Results  Component Value Date   CHOL 209 (H) 06/25/2018   CHOL 198 02/21/2018   Lab Results  Component Value Date   HDL 54 06/25/2018   HDL 54 02/21/2018   Lab Results  Component Value Date   LDLCALC 132 (H) 06/25/2018   LDLCALC 118 (H) 02/21/2018   Lab Results  Component Value Date   TRIG 116 06/25/2018   TRIG 129 02/21/2018   Lab Results  Component Value Date   CHOLHDL 3.9 06/25/2018   CHOLHDL 3.7 02/21/2018   No results found for: LDLDIRECT  Glucose:  Glucose  Date Value Ref Range Status  06/25/2018 87 65 - 99 mg/dL Final  02/21/2018 100 (H) 65 - 99 mg/dL Final    Skin cancer: discussed atypical lesions Colorectal cancer: up to date - Dr. Vicente Males Lung cancer:   Low Dose CT Chest recommended if Age 58-80 years, 30 pack-year currently smoking  OR have quit w/in 15years. Patient does not qualify.   QQP:YPPJK   Patient Active Problem List   Diagnosis Date Noted  . 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 History of migraine 03/25/2018  . Scoliosis 03/25/2018  . History of cyst of breast 03/25/2018  . Sacrum and coccyx fracture, sequela 02/21/2018  . Class 3 severe obesity due to excess calories without serious comorbidity with body mass index (BMI) of 45.0 to 49.9 in adult ([place]) 02/21/2018   . Cough 02/21/2018  . Onychomycosis 02/21/2018  . History of hypertension 02/21/2018  . Vitamin D deficiency 03/19/2015    Past Surgical History:  Procedure Laterality Date  . APPENDECTOMY    . BREAST CYST EXCISION Left    x2  . BREAST CYST EXCISION Right    unable to see scar  . NASAL SEPTUM SURGERY    . WISDOM TOOTH EXTRACTION      Family History  Problem Relation Age of Onset  . COPD Mother   . Cancer Mother   . Heart disease Father   . Breast cancer Paternal Aunt     Social History   Socioeconomic History  . Marital status: Single    Spouse name: Not on file  . Number of children: 0  . Years of education: Not on file  . Highest education level: Not on file  Occupational History  . Not on file  Social Needs  . Financial resource strain: Not on file  . Food insecurity    Worry: Never true    Inability: Never true  . Transportation needs    Medical: No    Non-medical: No  Tobacco Use  . Smoking status: Never Smoker  . Smokeless tobacco: Never Used  Substance and Sexual Activity  . Alcohol use: Never    Frequency: Never  . Drug use: Never  . Sexual activity: Not Currently  Lifestyle  . Physical activity    Days per week: 5 days    Minutes per session: 10 min  . Stress: Not at all  Relationships  . Social Herbalist on phone: Twice a week    Gets together: More than three times a week    Attends religious service: 1 to 4 times per year    Active member of club or organization: No    Attends meetings of clubs or organizations: Never    Relationship status: Never married  . Intimate partner violence    Fear of current or ex partner: No    Emotionally abused: No    Physically abused: No    Forced sexual activity: No  Other Topics Concern  . Not on file  Social History Narrative  . Not on file     Current Outpatient Medications:  .  albuterol (PROVENTIL) (2.5 MG/3ML) 0.083% nebulizer solution, Take 3 mLs (2.5 mg total) by  nebulization every 6 (six) hours as needed for wheezing or shortness of breath., Disp: 150 mL, Rfl: 0 .  omeprazole (PRILOSEC) 20 MG capsule, Take 1 capsule (20 mg total) by mouth 2 (two) times daily before a meal., Disp: 60 capsule, Rfl: 1  Allergies  Allergen Reactions  . Penicillins Other (See Comments)    "lose my hearing"     ROS  Constitutional: Negative for fever or weight change.  Respiratory: Negative for cough and shortness of breath.   Cardiovascular: Negative for chest pain or palpitations.  Gastrointestinal: Negative for abdominal pain,  no bowel changes.  Musculoskeletal: Negative for gait problem or joint swelling.  Skin: positive  for rash.  Neurological: Negative for dizziness or headache.  No other specific complaints in a complete review of systems (except as listed in HPI above).  Objective  Vitals:   08/22/18 0844  BP: 140/86  Pulse: 70  Resp: 16  Temp: (!) 96.8 F (36 C)  TempSrc: Temporal  SpO2: 98%  Weight: 265 lb 11.2 oz (120.5 kg)  Height: 5' 2.5" (1.588 m)    Body mass index is 47.82 kg/m.  Physical Exam  Constitutional: Patient appears well-developed and well-nourished. No distress.  HENT: Head: Normocephalic and atraumatic. Ears: B TMs ok, no erythema or effusion; Nose: Nose normal. Mouth/Throat: Oropharynx is clear and moist. No oropharyngeal exudate.  Eyes: Conjunctivae and EOM are normal. Pupils are equal, round, and reactive to light. No scleral icterus.  Neck: Normal range of motion. Neck supple. No JVD present. No thyromegaly present.  Cardiovascular: Normal rate, regular rhythm and normal heart sounds.  No murmur heard. No BLE edema. Pulmonary/Chest: Effort normal and breath sounds normal. No respiratory distress. Abdominal: Soft. Bowel sounds are normal, no distension. There is no tenderness. no masses Breast: no lumps or masses, no nipple discharge or rashes FEMALE GENITALIA:  External genitalia normal External urethra  normal Vaginal vault normal without discharge or lesions Cervix : polyp noticed, erythematous, friable cervix  Bimanual exam normal without masses RECTAL: not done Musculoskeletal: Normal gait, neck leaning forward Neurological: he is alert and oriented to person, place, and time. No cranial nerve deficit. Coordination, balance, strength, speech and gait are normal.  Skin: erythematous rash under both breasts, satellite lesions Psychiatric: Patient has a normal mood and affect. behavior is normal. Judgment and thought content normal.   Recent Results (from the past 2160 hour(s))  Lipid panel     Status: Abnormal   Collection Time: 06/25/18  2:56 PM  Result Value Ref Range   Cholesterol, Total 209 (H) 100 - 199 mg/dL   Triglycerides 116 0 - 149 mg/dL   HDL 54 >39 mg/dL   VLDL Cholesterol Cal 23 5 - 40 mg/dL   LDL Calculated 132 (H) 0 - 99 mg/dL   Chol/HDL Ratio 3.9 0.0 - 4.4 ratio    Comment:                                   T. Chol/HDL Ratio                                             Men  Women                               1/2 Avg.Risk  3.4    3.3                                   Avg.Risk  5.0    4.4                                2X Avg.Risk  9.6    7.1  3X Avg.Risk 23.4   11.0   Comprehensive metabolic panel     Status: Abnormal   Collection Time: 06/25/18  2:56 PM  Result Value Ref Range   Glucose 87 65 - 99 mg/dL   BUN 11 8 - 27 mg/dL   Creatinine, Ser 0.76 0.57 - 1.00 mg/dL   GFR calc non Af Amer 85 >59 mL/min/1.73   GFR calc Af Amer 98 >59 mL/min/1.73   BUN/Creatinine Ratio 14 12 - 28   Sodium 141 134 - 144 mmol/L   Potassium 4.2 3.5 - 5.2 mmol/L   Chloride 103 96 - 106 mmol/L   CO2 24 20 - 29 mmol/L   Calcium 9.5 8.7 - 10.3 mg/dL   Total Protein 7.0 6.0 - 8.5 g/dL   Albumin 4.7 3.8 - 4.8 g/dL   Globulin, Total 2.3 1.5 - 4.5 g/dL   Albumin/Globulin Ratio 2.0 1.2 - 2.2   Bilirubin Total 1.6 (H) 0.0 - 1.2 mg/dL   Alkaline  Phosphatase 119 (H) 39 - 117 IU/L   AST 20 0 - 40 IU/L   ALT 20 0 - 32 IU/L  H. pylori breath test     Status: None   Collection Time: 06/25/18  2:56 PM  Result Value Ref Range   H pylori Breath Test Negative Negative  Alkaline phosphatase, isoenzymes     Status: Abnormal   Collection Time: 06/25/18  2:56 PM  Result Value Ref Range   Alkaline Phosphatase 122 (H) 39 - 117 IU/L   LIVER FRACTION 66 18 - 85 %   BONE FRACTION 32 14 - 68 %   INTESTINAL FRAC. 2 0 - 18 %  Specimen status report     Status: None   Collection Time: 06/25/18  2:56 PM  Result Value Ref Range   specimen status report Comment     Comment: Written Authorization Written Authorization Written Authorization Received. Authorization received from [place] 07-03-2018 Logged by Luna Kitchens   PTH, Intact and Calcium     Status: None   Collection Time: 08/02/18 10:30 AM  Result Value Ref Range   Calcium 9.3 8.7 - 10.3 mg/dL   PTH 37 15 - 65 pg/mL   PTH Interp Comment     Comment: Interpretation                 Intact PTH    Calcium                                 (pg/mL)      (mg/dL) Normal                          15 - 65     8.6 - 10.2 Primary Hyperparathyroidism         >65          >10.2 Secondary Hyperparathyroidism       >65          <10.2 Non-Parathyroid Hypercalcemia       <65          >10.2 Hypoparathyroidism                  <15          < 8.6 Non-Parathyroid Hypocalcemia    15 - 65          < 8.6   Gamma GT  Status: None   Collection Time: 08/02/18 10:30 AM  Result Value Ref Range   GGT 29 0 - 60 IU/L  TSH     Status: None   Collection Time: 08/02/18 10:30 AM  Result Value Ref Range   TSH 1.940 0.450 - 4.500 uIU/mL  Mitochondrial/smooth muscle ab pnl     Status: None   Collection Time: 08/02/18 10:30 AM  Result Value Ref Range   Smooth Muscle Ab 3 0 - 19 Units    Comment:                  Negative                     0 - 19                  Weak positive               20 - 30                   Moderate to strong positive     >30  Actin Antibodies are found in 52-85% of patients with  autoimmune hepatitis or chronic active hepatitis and  in 22% of patients with primary biliary cirrhosis.    Mitochondrial Ab <20.0 0.0 - 20.0 Units    Comment:                                 Negative    0.0 - 20.0                                 Equivocal  20.1 - 24.9                                 Positive         >24.9 Mitochondrial (M2) Antibodies are found in 90-96% of patients with primary biliary cirrhosis.   Hepatitis C Antibody     Status: None   Collection Time: 08/02/18 10:30 AM  Result Value Ref Range   Hep C Virus Ab <0.1 0.0 - 0.9 s/co ratio    Comment:                                   Negative:     < 0.8                              Indeterminate: 0.8 - 0.9                                   Positive:     > 0.9  The CDC recommends that a positive HCV antibody result  be followed up with a HCV Nucleic Acid Amplification  test (030092).   Hepatitis B Surface AntiGEN     Status: None   Collection Time: 08/02/18 10:30 AM  Result Value Ref Range   Hepatitis B Surface Ag Negative Negative  Hepatitis B e antigen     Status: None   Collection Time: 08/02/18 10:30 AM  Result Value Ref Range   Hep B E  Ag Negative Negative      PHQ2/9: Depression screen [facility] 2/9 08/22/2018 06/25/2018 03/25/2018 02/21/2018  Decreased Interest 0 0 0 0  Down, Depressed, Hopeless 0 0 0 1  PHQ - 2 Score 0 0 0 1  Altered sleeping 0 0 0 0  Tired, decreased energy 0 0 0 1  Change in appetite 0 0 0 1  Feeling bad or failure about yourself  0 0 0 3  Trouble concentrating 0 0 0 0  Moving slowly or fidgety/restless 0 0 0 0  Suicidal thoughts 0 0 0 0  PHQ-9 Score 0 0 0 6  Difficult doing work/chores - Not difficult at all Not difficult at all Somewhat difficult     Fall Risk: Fall Risk  08/22/2018 06/25/2018 03/25/2018 02/21/2018  Falls in the past year? 0 0 0 0  Number falls in past yr: 0 0  0 0  Injury with Fall? 0 0 0 0  Follow up - - Falls evaluation completed Falls evaluation completed     Functional Status Survey: Is the patient deaf or have difficulty hearing?: No Does the patient have difficulty seeing, even when wearing glasses/contacts?: No Does the patient have difficulty concentrating, remembering, or making decisions?: No Does the patient have difficulty walking or climbing stairs?: No Does the patient have difficulty dressing or bathing?: No Does the patient have difficulty doing errands alone such as visiting a doctor's office or shopping?: No   Assessment & Plan  1. Cervical cancer screening  - Pap IG and HPV (high risk) DNA detection  2. Need for Tdap vaccination  - Tdap vaccine greater than or equal to 7yo IM  3. Encounter for screening for HIV  We will wait until next labs   4. Dyslipidemia  - EKG 12-Lead  5. Well woman exam  - EKG 12-Lead  6. Cervical polyp  She wants to hold off on referral to GYN  7. Nocturia  Normal ESS, avoid fluids prior to bed time and caffeine  8. Class 3 severe obesity due to excess calories without serious comorbidity with body mass index (BMI) of 45.0 to 49.9 in adult [facility])  Discussed with the patient the risk posed by an increased BMI. Discussed importance of portion control, calorie counting and at least 150 minutes of physical activity weekly. Avoid sweet beverages and drink more water. Eat at least 6 servings of fruit and vegetables daily   9. Intertrigo  otc clotrimazole and hydrocortisone advised   10. Diabetes mellitus screening  - POCT HgB A1C  11. Pre-diabetes  Discussed life style modification   -USPSTF grade A and B recommendations reviewed with patient; age-appropriate recommendations, preventive care, screening tests, etc discussed and encouraged; healthy living encouraged; see AVS for patient education given to patient -Discussed importance of 150 minutes of physical activity weekly,  eat two servings of fish weekly, eat one serving of tree nuts ( cashews, pistachios, pecans, almonds.Marland Kitchen) every other day, eat 6 servings of fruit/vegetables daily and drink plenty of water and avoid sweet beverages.

## 2018-08-28 LAB — PAP IG AND HPV HIGH-RISK: HPV, high-risk: NEGATIVE

## 2018-09-05 ENCOUNTER — Ambulatory Visit: Payer: Managed Care, Other (non HMO) | Admitting: Gastroenterology

## 2018-09-05 ENCOUNTER — Other Ambulatory Visit: Payer: Self-pay

## 2018-09-05 VITALS — BP 148/80 | HR 76 | Temp 98.3°F | Ht 64.0 in | Wt 264.8 lb

## 2018-09-05 DIAGNOSIS — R7989 Other specified abnormal findings of blood chemistry: Secondary | ICD-10-CM

## 2018-09-05 DIAGNOSIS — R945 Abnormal results of liver function studies: Secondary | ICD-10-CM | POA: Diagnosis not present

## 2018-09-05 DIAGNOSIS — G8929 Other chronic pain: Secondary | ICD-10-CM | POA: Diagnosis not present

## 2018-09-05 DIAGNOSIS — R1011 Right upper quadrant pain: Secondary | ICD-10-CM | POA: Diagnosis not present

## 2018-09-05 NOTE — Progress Notes (Signed)
Jonathon Bellows MD, MRCP(U.K) [address]  [place]  [place], [place] [address]  Main: [phone]  Fax: [phone]   Primary Care Physician: Hubbard Hartshorn, FNP  Primary Gastroenterologist:  Dr. Jonathon Bellows   Abnormal LFT's   HPI: Tina Stephenson is a 63 y.o. female  Here to follow up for abnormal LFT's.  She recalls had a "cold " in 11/2017 - had bronchitis- treated 3 times and finally cleared up . Was coughing hence labs were ordered . When she drinks cold water she coughs,   Abdominal pain: Onset: 1 month back  Site :RUQ, sometimes all day and sometimes on and off  Radiation: localized  Severity :8/10 at times  Petra Kuba of pain: like a knife inside  Aggravating factors: unsure  Relieving factors :nothing - worse when she eats usually 30 mins after she eats  Weight loss: trying to but not  NSAID use: no  PPI use occasionaly heartburn - on omeprazole 20 mg -  With food  Gall bladder surgery: intact  Frequency of bowel movements: several times or atleast 1-2 times a day - soft Change in bowel movements: lot more softer - different color- lighter  Relief with bowel movements: yes  Gas/Bloating/Abdominal distension: yes   Last colonoscopy in 2016 - no family , no family history of colon polyps or cancer    06/25/2018: H. pylori breath test: Negative  Alkaline phosphatase 122 fractionated appears normal with individual components.  Upper limit of normal of alkaline phosphatase is 117.  Has been very minimally elevated over the last few months.  With normal transaminases, albumin, total bilirubin    Interval history   07/24/2018-  09/05/2018  08/02/2018: Hep B e antigen , surface antigen,HCV ab,Hep Be ag, ,  -negative. PTH,GGT,TSH ,SM ab, AMA, normal  08/02/2018: RUQ USG- fatty liver and 4 mm gall bladder polyp  Still have on and off right upper quadrant pain.  Ranges from 2-5 in intensity.  Not related to meals. Current Outpatient Medications  Medication Sig Dispense  Refill  . albuterol (PROVENTIL) (2.5 MG/3ML) 0.083% nebulizer solution Take 3 mLs (2.5 mg total) by nebulization every 6 (six) hours as needed for wheezing or shortness of breath. 150 mL 0  . omeprazole (PRILOSEC) 20 MG capsule Take 1 capsule (20 mg total) by mouth 2 (two) times daily before a meal. 60 capsule 1   No current facility-administered medications for this visit.     Allergies as of 09/05/2018 - Review Complete 08/22/2018  Allergen Reaction Noted  . Penicillins Other (See Comments) 04/14/2017    ROS:  General: Negative for anorexia, weight loss, fever, chills, fatigue, weakness. ENT: Negative for hoarseness, difficulty swallowing , nasal congestion. CV: Negative for chest pain, angina, palpitations, dyspnea on exertion, peripheral edema.  Respiratory: Negative for dyspnea at rest, dyspnea on exertion, cough, sputum, wheezing.  GI: See history of present illness. GU:  Negative for dysuria, hematuria, urinary incontinence, urinary frequency, nocturnal urination.  Endo: Negative for unusual weight change.    Physical Examination:   There were no vitals taken for this visit.  General: Well-nourished, well-developed in no acute distress.  Eyes: No icterus. Conjunctivae pink. Mouth: Oropharyngeal mucosa moist and pink , no lesions erythema or exudate. Lungs: Clear to auscultation bilaterally. Non-labored. Heart: Regular rate and rhythm, no murmurs rubs or gallops.  Abdomen: Bowel sounds are normal, nontender, nondistended, no hepatosplenomegaly or masses, no abdominal bruits or hernia , no rebound or guarding.   Extremities: No lower extremity  edema. No clubbing or deformities. Neuro: Alert and oriented x 3.  Grossly intact. Skin: Warm and dry, no jaundice.   Psych: Alert and cooperative, normal mood and affect.   Imaging Studies: No results found.  Assessment and Plan:   Tina Stephenson is a 73 y.o. y/o female herwe to follow up for abdominal pain today she states is in  the right upper quadrant.  Ultrasound of the abdomen shows only a gallbladder polyp which is 4 mm in size.  The description of her pain is not typical of biliary colic.  Hence I would like to rule out biliary dyskinesia with HIDA scan.  She also previously had an elevated alkaline phosphatase which is very mild mildly elevated and autoimmune liver work-up has been negative.  This is very likely secondary to nonalcoholic fatty liver disease.  Plan :   1. USG abdomen in 1 year to ensure gall bladder polyp size stable.  Recheck LFTs in 4 to 6 months 2. HIDA scan to rule out biliary dyskinesia and if negative proceed with CT scan of the abdomen.  May need to consider colonoscopy at some point.  Her last colonoscopy was in 2016 and recalls was normal. 3. Commence on FD guard.  She is not keen on trying any prescription medications.  Such as Bentyl 4. I do not think that the gallbladder polyp is causing her right upper quadrant pain as the nature of pain is not typical biliary nature.  If all tests are negative we may have to re-entertain this diagnosis.  If he feels that the gallbladder polyp is causing the pain then she will require a cholecystectomy.   Dr Jonathon Bellows  MD,MRCP [facility]) Follow up in 8 weeks

## 2018-09-10 ENCOUNTER — Other Ambulatory Visit: Payer: Self-pay

## 2018-09-10 DIAGNOSIS — R933 Abnormal findings on diagnostic imaging of other parts of digestive tract: Secondary | ICD-10-CM

## 2018-09-19 ENCOUNTER — Encounter: Payer: Self-pay | Admitting: Gastroenterology

## 2018-09-19 ENCOUNTER — Other Ambulatory Visit: Payer: Self-pay

## 2018-09-19 ENCOUNTER — Encounter
Admission: RE | Admit: 2018-09-19 | Discharge: 2018-09-19 | Disposition: A | Discharge status: Home / Self Care | Payer: Managed Care, Other (non HMO) | Source: Ambulatory Visit | Attending: Gastroenterology | Admitting: Gastroenterology

## 2018-09-19 DIAGNOSIS — R933 Abnormal findings on diagnostic imaging of other parts of digestive tract: Secondary | ICD-10-CM | POA: Diagnosis present

## 2018-09-19 IMAGING — NM NM HEPATO W/GB/PHARM/[PERSON_NAME]
2 series · 12 of 12 positions shown · non-contrast
Comparison: None

CLINICAL DATA: Abdominal pain, nausea and change in bowel habits
for 2 months

EXAM:
NUCLEAR MEDICINE HEPATOBILIARY IMAGING WITH GALLBLADDER EF
TECHNIQUE: Sequential images of the abdomen were obtained [DATE] minutes
following intravenous administration of radiopharmaceutical. After
oral ingestion of Ensure, gallbladder ejection fraction was
determined. At 60 min, normal ejection fraction is greater than 33%.
RADIOPHARMACEUTICALS:  5.012 mCi [XG]  Choletec IV

[Series 1000: hepatobiliary scan · 9.59mm/px · 6 of 60 frames shown]
[frame 6/60]
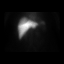
[frame 16/60]
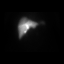
[frame 26/60]
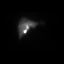
[frame 36/60]
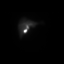
[frame 46/60]
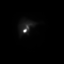
[frame 56/60]
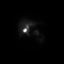

[Series 1000: gallbladder ef · 4.80mm/px · 6 of 120 frames shown]
[frame 11/120]
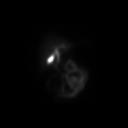
[frame 31/120]
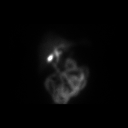
[frame 51/120]
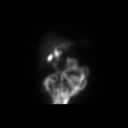
[frame 71/120]
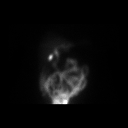
[frame 91/120]
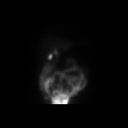
[frame 111/120]
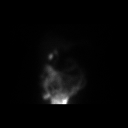

[12 of 12 positions shown; findings below may reference images not displayed]

FINDINGS: Normal tracer extraction from bloodstream indicating normal
hepatocellular function.

Normal excretion of tracer into biliary tree.

Gallbladder visualized at 15 min.

Small bowel visualized at 47 min.

No hepatic retention of tracer.

Subjectively normal emptying of tracer from gallbladder following
fatty meal stimulation.

Calculated gallbladder ejection fraction is 94%, normal.

Patient reported mild abdominal cramping following Ensure ingestion.

Normal gallbladder ejection fraction following Ensure ingestion is
greater than 33% at 1 hour.
IMPRESSION: Patent biliary tree with normal gallbladder ejection fraction of 94%
following fatty meal stimulation.

Patient reported mild abdominal cramping following Ensure ingestion.

## 2018-09-19 MED ORDER — TECHNETIUM TC 99M MEBROFENIN IV KIT
5.0000 | PACK | Freq: Once | INTRAVENOUS | Status: AC | PRN
  Administered 2018-09-19: 5.012 via INTRAVENOUS

## 2018-09-19 NOTE — Progress Notes (Signed)
Inform HIDA scan is normal.  If she still has abdominal pain the next up would be to obtain a CT scan of the abdomen if she is willing please proceed if has abdominal pain

## 2018-09-23 ENCOUNTER — Other Ambulatory Visit: Payer: Self-pay

## 2018-09-23 ENCOUNTER — Telehealth: Payer: Self-pay

## 2018-09-23 DIAGNOSIS — R933 Abnormal findings on diagnostic imaging of other parts of digestive tract: Secondary | ICD-10-CM

## 2018-09-23 DIAGNOSIS — R1011 Right upper quadrant pain: Secondary | ICD-10-CM

## 2018-09-23 NOTE — Telephone Encounter (Signed)
-----   Message from Jonathon Bellows, MD sent at 09/19/2018 12:31 PM EDT ----- Inform HIDA scan is normal.  If she still has abdominal pain the next up would be to obtain a CT scan of the abdomen if she is willing please proceed if has abdominal pain

## 2018-09-23 NOTE — Telephone Encounter (Signed)
Spoke with pt and informed her of HIDA scan results and Dr. Georgeann Oppenheim suggestions. Pt states she still has abdominal pain and agrees to the CT scan. CT appointment has been scheduled.

## 2018-09-30 ENCOUNTER — Telehealth: Payer: Self-pay | Admitting: Gastroenterology

## 2018-09-30 ENCOUNTER — Other Ambulatory Visit: Payer: Self-pay

## 2018-09-30 DIAGNOSIS — R933 Abnormal findings on diagnostic imaging of other parts of digestive tract: Secondary | ICD-10-CM

## 2018-09-30 NOTE — Telephone Encounter (Signed)
Melissa from [facility] left vm regarding pt CT scan 10/01/18 being denied she would like to know if they are doing a Peer to Peer [phone] ext [phone]

## 2018-10-01 ENCOUNTER — Ambulatory Visit: Admission: RE | Admit: 2018-10-01 | Payer: Managed Care, Other (non HMO) | Source: Ambulatory Visit

## 2018-10-01 ENCOUNTER — Telehealth: Payer: Self-pay | Admitting: Gastroenterology

## 2018-10-01 ENCOUNTER — Ambulatory Visit
Admission: RE | Admit: 2018-10-01 | Discharge: 2018-10-01 | Disposition: A | Discharge status: Home / Self Care | Payer: Managed Care, Other (non HMO) | Source: Ambulatory Visit | Attending: Gastroenterology | Admitting: Gastroenterology

## 2018-10-01 ENCOUNTER — Other Ambulatory Visit: Payer: Self-pay

## 2018-10-01 DIAGNOSIS — R933 Abnormal findings on diagnostic imaging of other parts of digestive tract: Secondary | ICD-10-CM | POA: Diagnosis present

## 2018-10-01 LAB — POCT I-STAT CREATININE: Creatinine, Ser: 0.6 mg/dL (ref 0.44–1.00)

## 2018-10-01 IMAGING — CT CT ABDOMEN W/ CM
2 of 5 series · 15 of 46 positions shown, 17 images · IV contrast (omnipaque)
Comparison: Ultrasound exam [DATE]

CLINICAL DATA: Right upper quadrant abdominal pain.

EXAM:
CT ABDOMEN WITH CONTRAST
TECHNIQUE: Multidetector CT imaging of the abdomen was performed using the
standard protocol following bolus administration of intravenous
contrast.
CONTRAST:  100mL OMNIPAQUE IOHEXOL 300 MG/ML  SOLN

[Series 2: abd pelvis · axial · 0.92mm/px · z∈[-1341,-1066]mm · 12 of 65 slices shown, 14 images]
[im 5/65  soft-tissue]
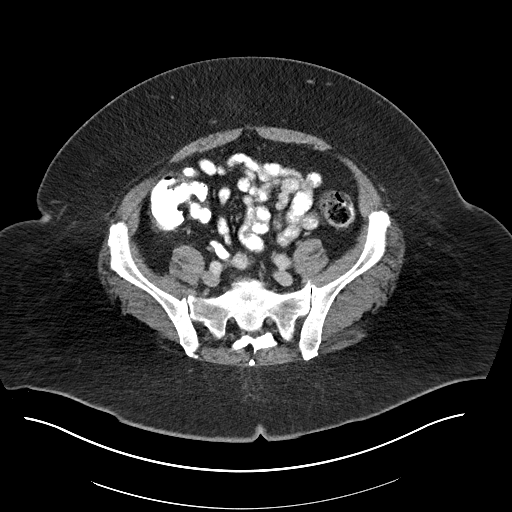
[im 5/65  bone]
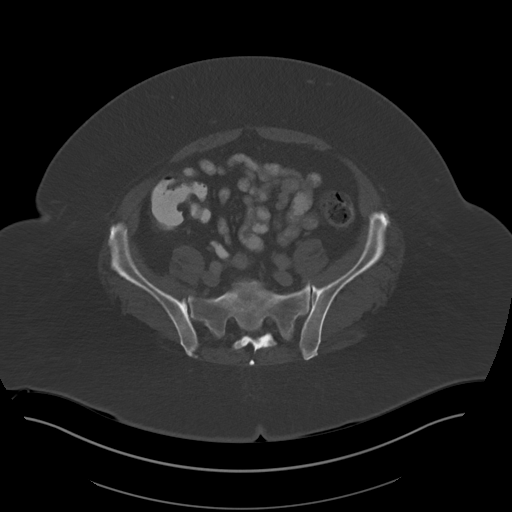
[im 10/65  soft-tissue]
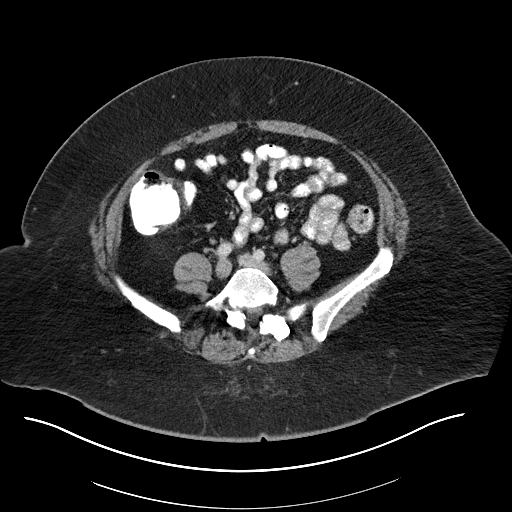
[im 14/65  soft-tissue]
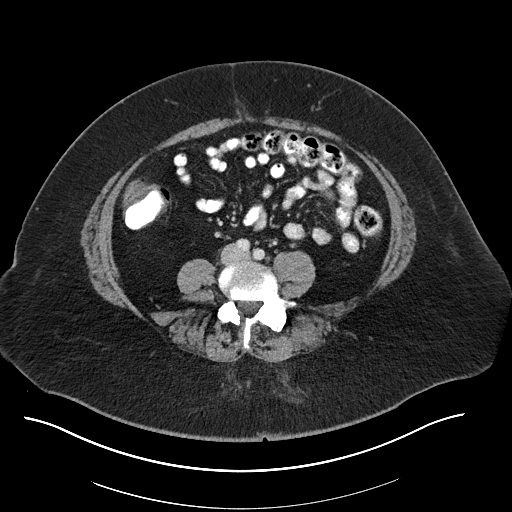
[im 19/65  soft-tissue]
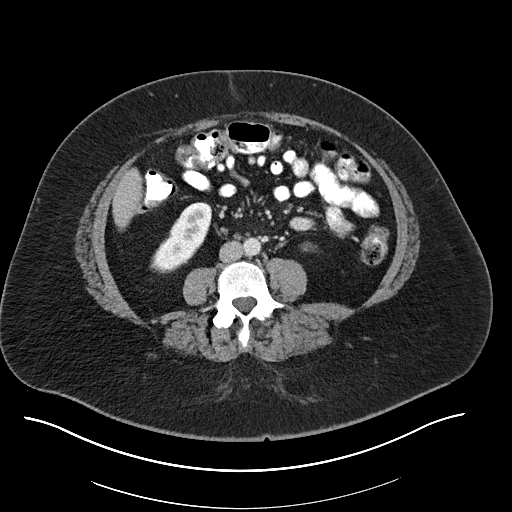
[im 23/65  soft-tissue]
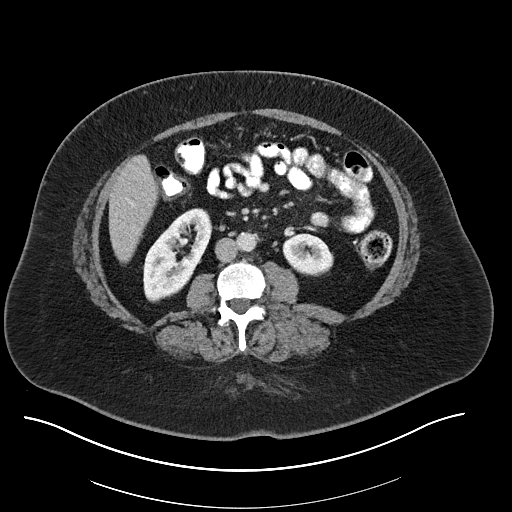
[im 28/65  soft-tissue]
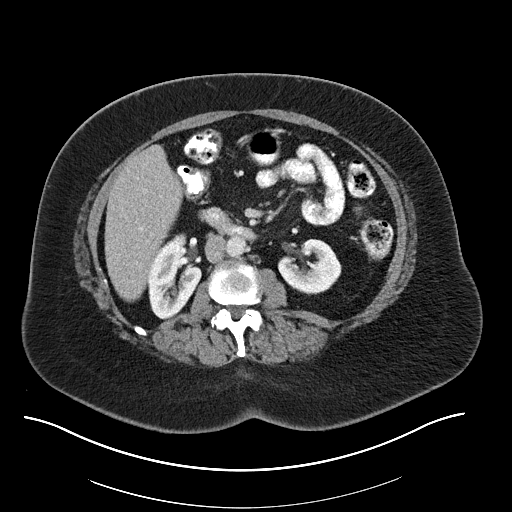
[im 37/65  soft-tissue]
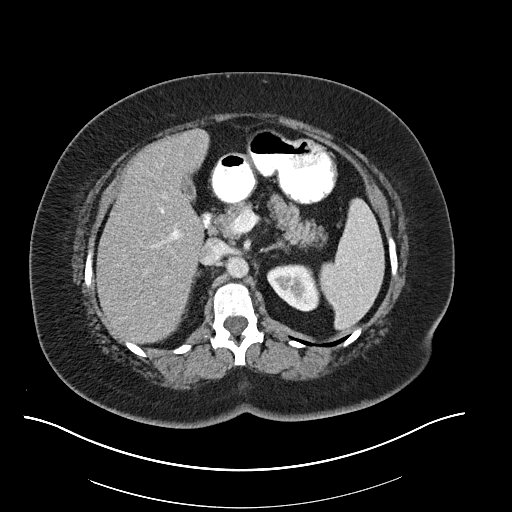
[im 42/65  soft-tissue]
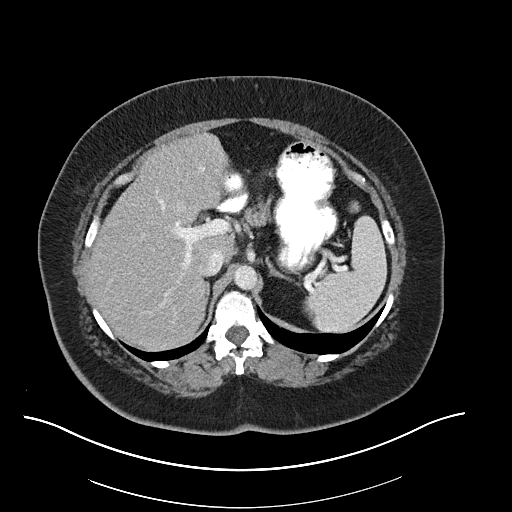
[im 46/65  soft-tissue]
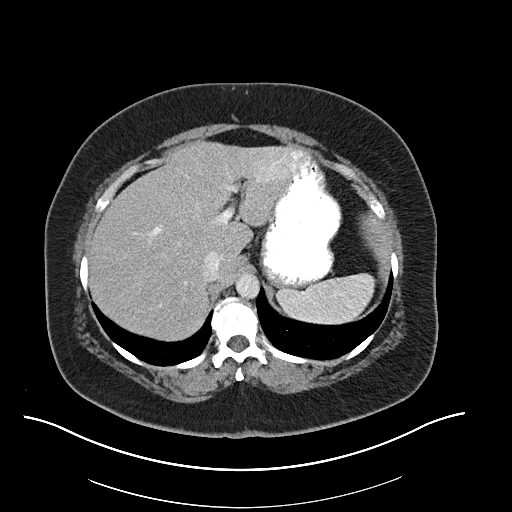
[im 46/65  bone]
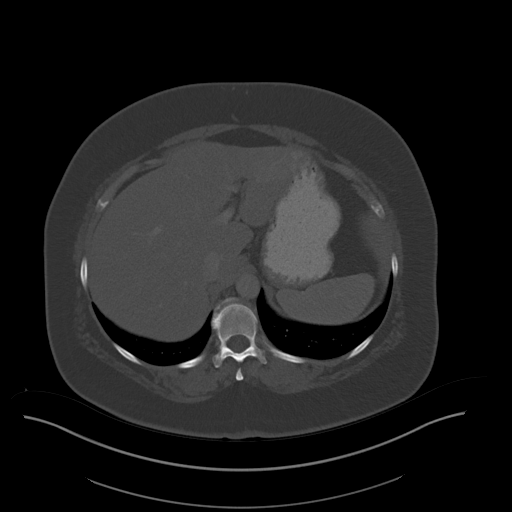
[im 51/65  soft-tissue]
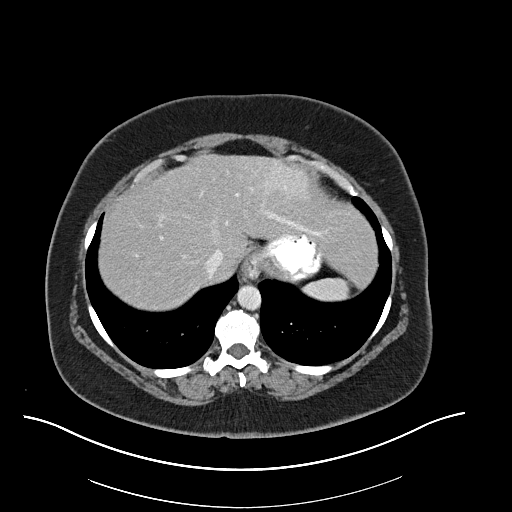
[im 55/65  soft-tissue]
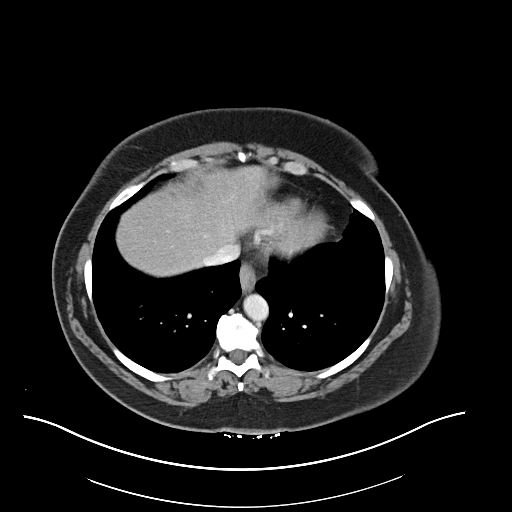
[im 60/65  soft-tissue]
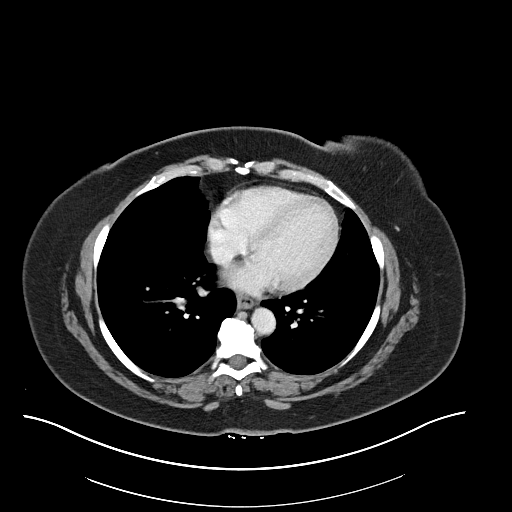

[Series 4: coronals abd pelvis · coronal · 0.64mm/px · 3 of 235 slices shown]
[im 79/235  soft-tissue]
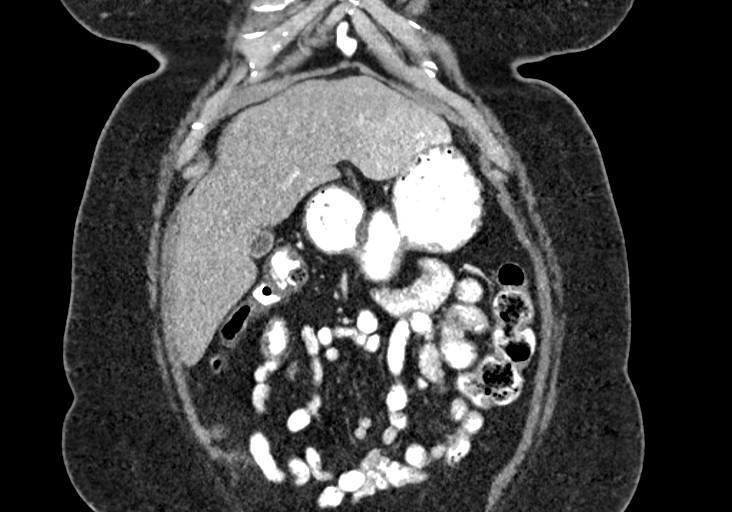
[im 105/235  soft-tissue]
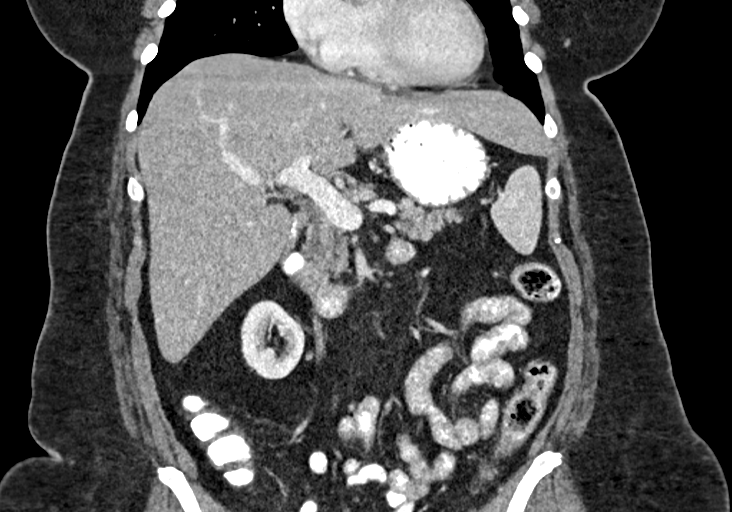
[im 131/235  soft-tissue]
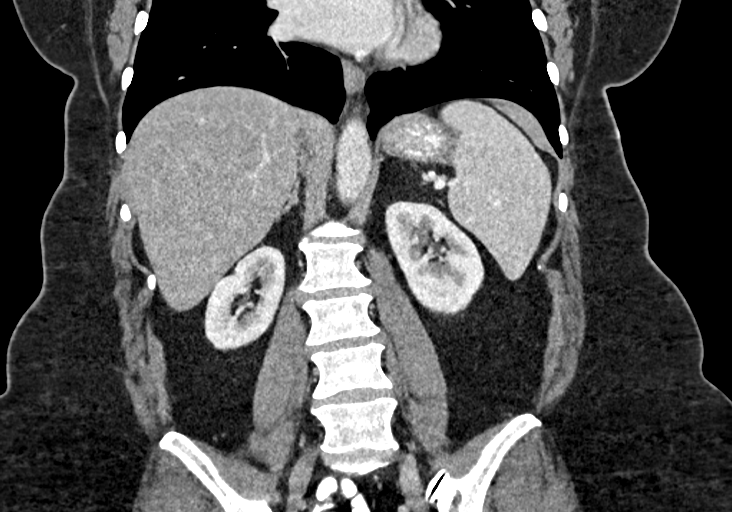

[15 of 46 positions shown; findings below may reference images not displayed]

FINDINGS: Lower chest: Unremarkable.

Hepatobiliary: 2.1 x 2.3 cm hypervascular lesion is identified in
the lateral segment left liver (axial 18/series 2 and sagittal
136/series 6). Liver otherwise unremarkable without definite
features of steatosis on this study. Gallbladder decompressed. No
intrahepatic or extrahepatic biliary dilation.

Pancreas: No focal mass lesion. No dilatation of the main duct. No
intraparenchymal cyst. No peripancreatic edema.

Spleen: No splenomegaly. No focal mass lesion.

Adrenals/Urinary Tract: No adrenal nodule or mass. Right kidney is
malrotated with accessory lower pole vascular anatomy. Left kidney
unremarkable

Stomach/Bowel: Tiny hiatal hernia. Stomach otherwise unremarkable.
Duodenum is normally positioned as is the ligament of Treitz. Small
duodenal diverticulum evident. No small bowel or colonic dilatation
within the visualized abdomen.

Vascular/Lymphatic: No abdominal aortic aneurysm. No abdominal
aortic atherosclerotic calcification. There is no gastrohepatic or
hepatoduodenal ligament lymphadenopathy. No intraperitoneal or
retroperitoneal lymphadenopathy.

Other: No intraperitoneal free fluid.

Musculoskeletal: No worrisome lytic or sclerotic osseous
abnormality.
IMPRESSION: 1. 2.1 x 2.3 cm hypervascular lesion identified lateral segment left
liver. This cannot be further characterized on today's CT scan.
Dedicated abdominal MRI with and without contrast recommended to
further evaluate.
2. Otherwise no acute findings or features to explain the patient's
history of right upper quadrant pain.

These results will be called to the ordering clinician or
representative by the Radiologist Assistant, and communication
documented in the PACS or zVision Dashboard.

## 2018-10-01 MED ORDER — IOHEXOL 300 MG/ML  SOLN
100.0000 mL | Freq: Once | INTRAMUSCULAR | Status: AC | PRN
  Administered 2018-10-01: 100 mL via INTRAVENOUS

## 2018-10-01 NOTE — Telephone Encounter (Signed)
Tina Stephenson called from [facility] & needs an order faxed on ct abd with contrast. For today's appointment Patient to arrive @ 3:30 for a 4:00 scan. [phone] [phone].

## 2018-10-02 ENCOUNTER — Other Ambulatory Visit: Payer: Self-pay

## 2018-10-02 DIAGNOSIS — R933 Abnormal findings on diagnostic imaging of other parts of digestive tract: Secondary | ICD-10-CM

## 2018-10-02 NOTE — Progress Notes (Signed)
There is a lesion 2.1 x 2.3 cm seen in the left part of the liver.  It is not clear as per the study as to what it might be.  I would suggest to be further evaluated with a MRI liver mass protocol.  Please inform the patient and schedule.

## 2018-10-03 ENCOUNTER — Telehealth: Payer: Self-pay

## 2018-10-03 NOTE — Telephone Encounter (Signed)
Spoke with pt and informed her of CT scan results and Dr. Georgeann Oppenheim suggestion. Pt agrees to proceed with MRI.

## 2018-10-03 NOTE — Telephone Encounter (Signed)
-----   Message from Jonathon Bellows, MD sent at 10/02/2018  8:30 AM EDT ----- There is a lesion 2.1 x 2.3 cm seen in the left part of the liver.  It is not clear as per the study as to what it might be.  I would suggest to be further evaluated with a MRI liver mass protocol.  Please inform the patient and schedule.

## 2018-10-08 ENCOUNTER — Telehealth: Payer: Self-pay | Admitting: Gastroenterology

## 2018-10-08 NOTE — Telephone Encounter (Signed)
Patient is scheduled next week to have an Mri ordered by Dr Vicente Males & the patient was checking to make sure it was precerted.

## 2018-10-09 NOTE — Telephone Encounter (Signed)
Advised pt MRI is still pending with her insurance. I will contact her once it gets approved.

## 2018-10-16 ENCOUNTER — Telehealth: Payer: Self-pay | Admitting: Gastroenterology

## 2018-10-16 NOTE — Telephone Encounter (Signed)
Mardene Celeste from [facility] left vm  She faxed over a attending Provider form for pt Disability and leave  To be signed please call [phone]

## 2018-10-17 ENCOUNTER — Other Ambulatory Visit: Payer: Self-pay

## 2018-10-17 ENCOUNTER — Ambulatory Visit
Admission: RE | Admit: 2018-10-17 | Discharge: 2018-10-17 | Disposition: A | Discharge status: Home / Self Care | Payer: Managed Care, Other (non HMO) | Source: Ambulatory Visit | Attending: Gastroenterology | Admitting: Gastroenterology

## 2018-10-17 ENCOUNTER — Encounter: Payer: Self-pay | Admitting: Gastroenterology

## 2018-10-17 DIAGNOSIS — R933 Abnormal findings on diagnostic imaging of other parts of digestive tract: Secondary | ICD-10-CM | POA: Diagnosis present

## 2018-10-17 IMAGING — MR MR ABDOMEN WO/W CM
19 series · 47 of 48 positions shown · IV contrast (gadavist)
Comparison: CT on [DATE]

CLINICAL DATA: Abdominal pain, nausea, and diarrhea. Indeterminate
liver lesion on recent CT.

EXAM:
MRI ABDOMEN WITHOUT AND WITH CONTRAST
TECHNIQUE: Multiplanar multisequence MR imaging of the abdomen was performed
both before and after the administration of intravenous contrast.
CONTRAST:  10mL GADAVIST GADOBUTROL 1 MMOL/ML IV SOLN

[Series 2: T2 · coronal · 6.5mm · 1.19mm/px · 1 of 30 slices shown (1 of 2)]
[im 1/30]
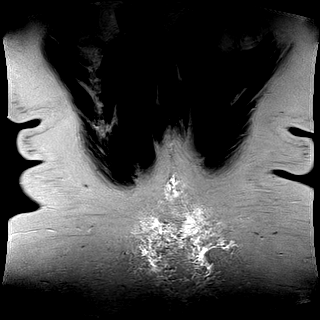

[Series 5: T2 fat-sat · axial · 6.5mm · 1.19mm/px · 1 of 37 slices shown]
[im 1/37]
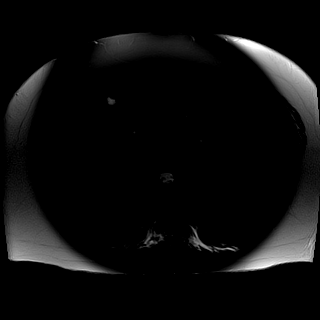

[Series 6: T2 · axial · 6.5mm · 1.19mm/px · 1 of 39 slices shown (2 of 2)]
[im 1/39]
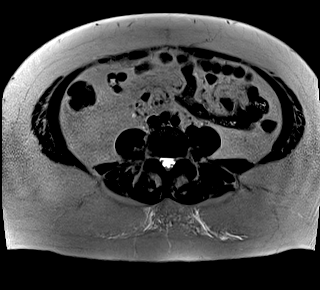

[Series 7: ax dwi_tracew · axial · 6.5mm · 1.42mm/px · 1 of 38 slices shown (1 of 3)]
[im 1/38]
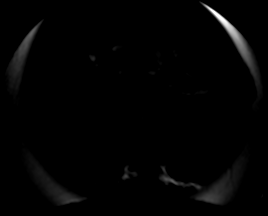

[Series 7: ax dwi_tracew · axial · 6.5mm · 1.42mm/px · 1 of 38 slices shown (2 of 3)]
[im 1/38]
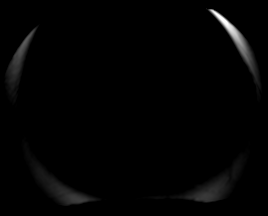

[Series 7: ax dwi_tracew · axial · 6.5mm · 1.42mm/px · 1 of 38 slices shown (3 of 3)]
[im 1/38]
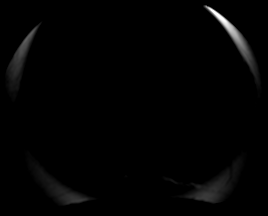

[Series 8: ax dwi_adc · axial · 6.5mm · 1.42mm/px · 1 of 38 slices shown]
[im 1/38]
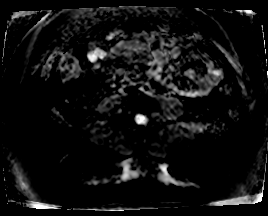

[Series 9: T1 · axial · 6.5mm · 0.74mm/px · z∈[-99,+198]mm · 2 of 39 slices shown (1 of 2)]
[im 1/39]
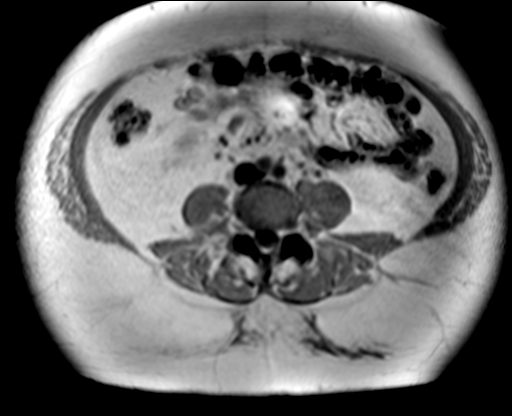
[im 39/39]
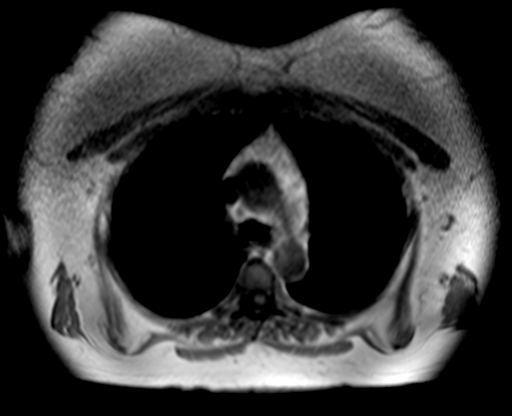

[Series 9: T1 · axial · 6.5mm · 0.74mm/px · z∈[-99,+198]mm · 2 of 39 slices shown (2 of 2)]
[im 1/39]
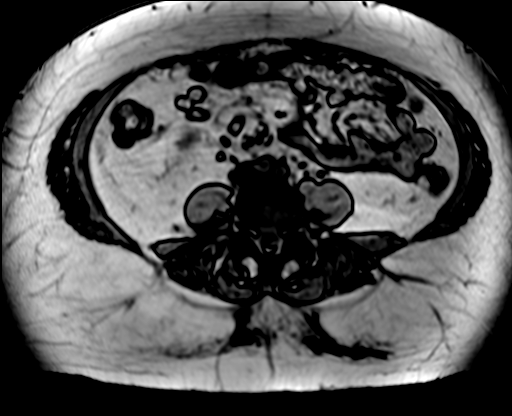
[im 39/39]
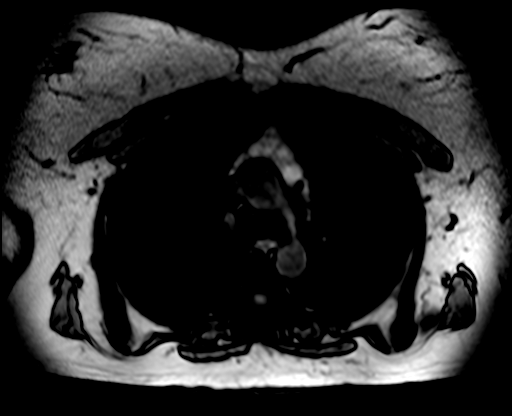

[Series 10: bSSFP · axial · 6.5mm · 0.74mm/px · z∈[-99,+198]mm · 2 of 39 slices shown]
[im 1/39]
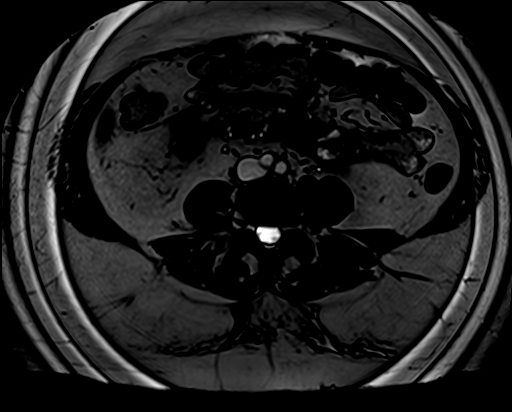
[im 39/39]
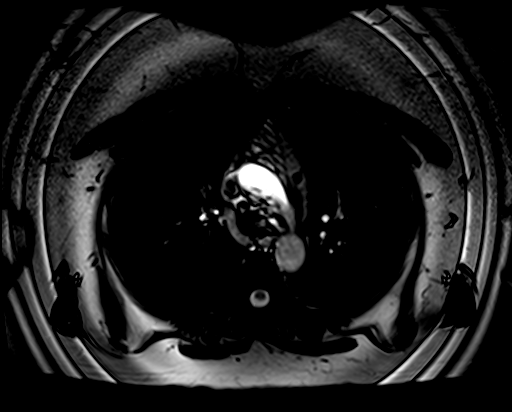

[Series 11: T1 dynamic fat-sat · axial · non-contrast · 3.0mm · 1.19mm/px · z∈[-100,+185]mm · 4 of 96 slices shown (1 of 4)]
[im 1/96]
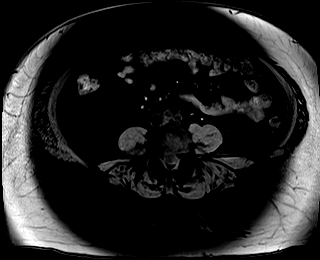
[im 32/96]
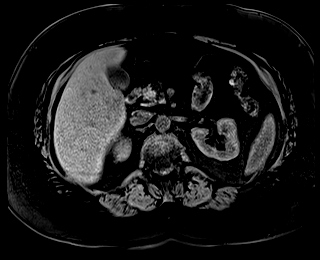
[im 64/96]
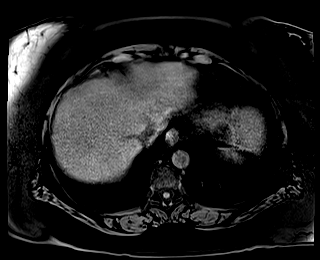
[im 96/96]
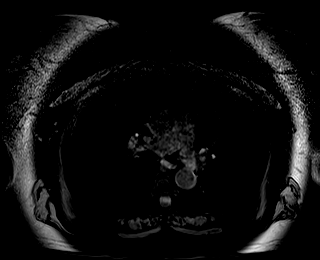

[Series 12: T1 dynamic fat-sat post-contrast · axial · 3.0mm · 1.19mm/px · z∈[-100,+185]mm · 4 of 96 slices shown (1 of 4)]
[im 1/96]
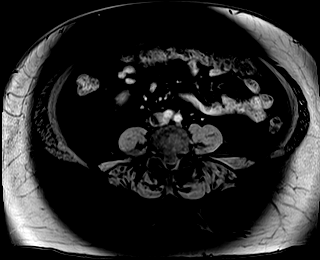
[im 32/96]
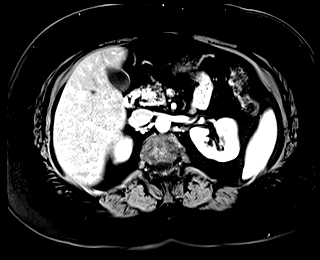
[im 64/96]
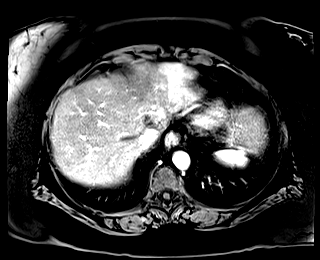
[im 96/96]
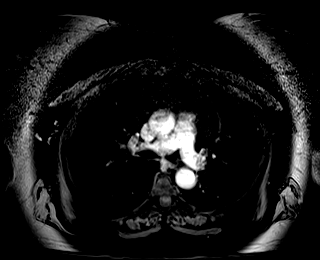

[Series 13: T1 dynamic fat-sat · axial · 3.0mm · 1.19mm/px · z∈[-100,+185]mm · 4 of 96 slices shown (2 of 4)]
[im 1/96]
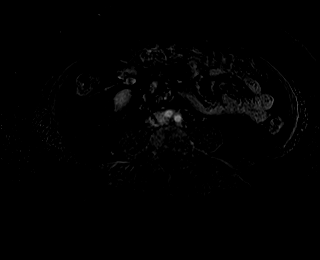
[im 32/96]
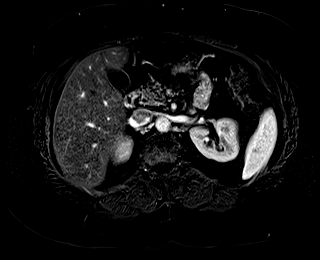
[im 64/96]
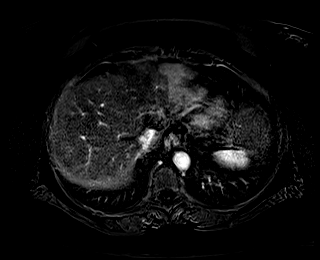
[im 96/96]
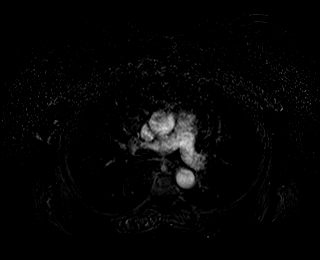

[Series 14: T1 dynamic fat-sat post-contrast · axial · 3.0mm · 1.19mm/px · z∈[-100,+185]mm · 4 of 96 slices shown (2 of 4)]
[im 1/96]
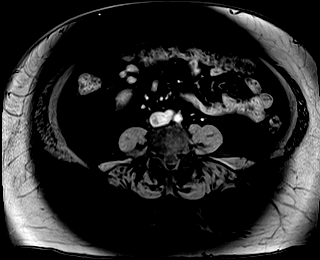
[im 32/96]
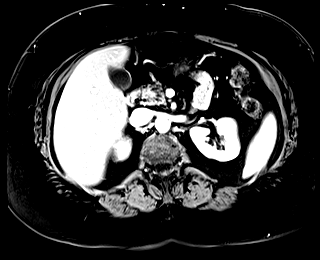
[im 64/96]
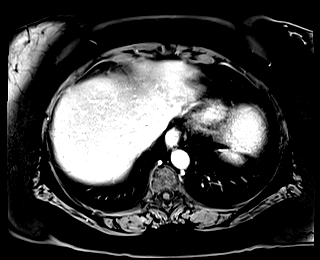
[im 96/96]
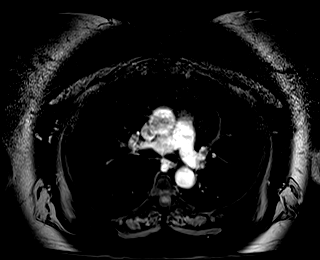

[Series 15: T1 dynamic fat-sat · axial · 3.0mm · 1.19mm/px · z∈[-100,+185]mm · 4 of 96 slices shown (3 of 4)]
[im 1/96]
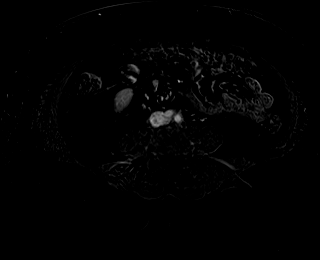
[im 32/96]
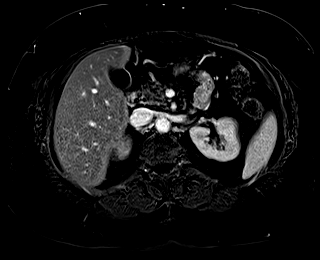
[im 64/96]
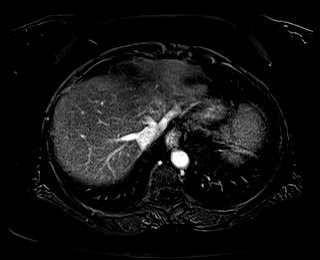
[im 96/96]
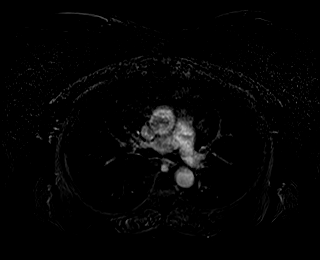

[Series 16: T1 dynamic fat-sat post-contrast · axial · 3.0mm · 1.19mm/px · z∈[-100,+185]mm · 4 of 96 slices shown (3 of 4)]
[im 1/96]
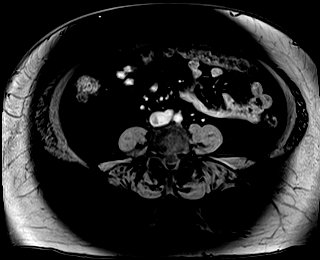
[im 32/96]
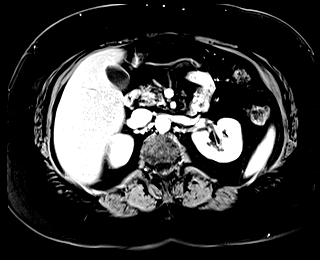
[im 64/96]
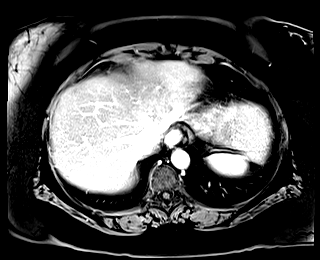
[im 96/96]
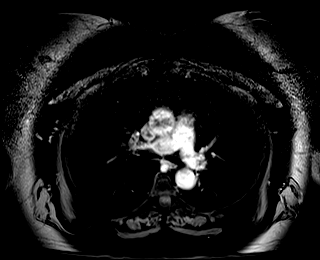

[Series 17: T1 dynamic fat-sat · axial · 3.0mm · 1.19mm/px · z∈[-100,+185]mm · 4 of 96 slices shown (4 of 4)]
[im 1/96]
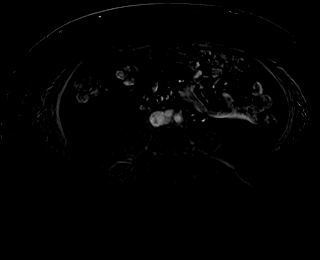
[im 32/96]
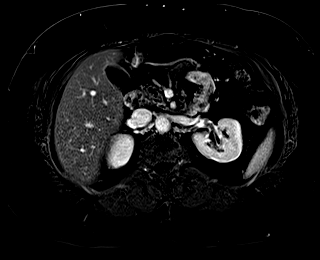
[im 64/96]
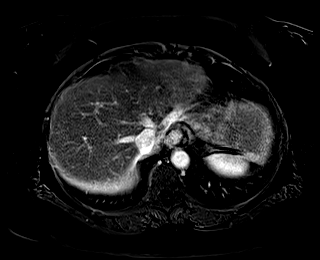
[im 96/96]
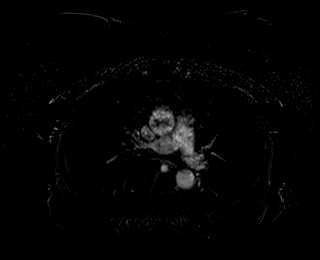

[Series 18: T1 dynamic post-contrast · coronal · 3.0mm · 1.31mm/px · 3 of 72 slices shown]
[im 1/72]
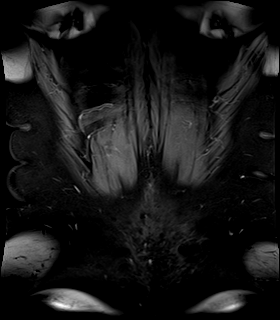
[im 36/72]
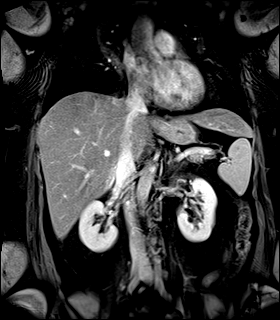
[im 72/72]
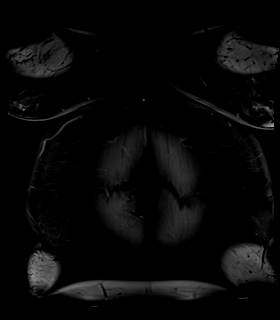

[Series 19: T1 dynamic fat-sat post-contrast · axial · 3.0mm · 1.19mm/px · z∈[-100,+89]mm · 3 of 96 slices shown (4 of 4)]
[im 1/96]
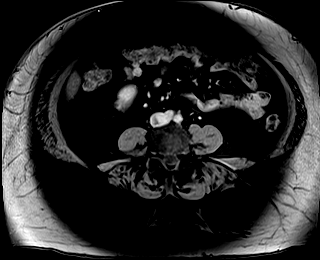
[im 32/96]
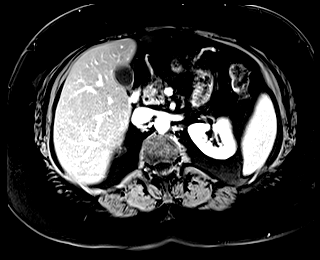
[im 64/96]
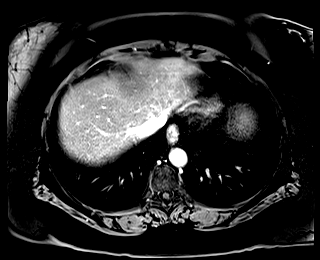

[47 of 48 positions shown; findings below may reference images not displayed]

FINDINGS: Lower chest: No acute findings.

Hepatobiliary: A 2.3 cm mass is seen in the lateral segment left
lobe which shows marked T2 hyperintensity and contrast enhancement
equivalent to blood pool on all phases, consistent with a benign
hemangioma. No other liver masses are identified. Gallbladder is
unremarkable. No evidence of biliary ductal dilatation.

Pancreas:  No mass or inflammatory changes.

Spleen:  Within normal limits in size and appearance.

Adrenals/Urinary Tract: No masses identified. No evidence of
hydronephrosis.

Stomach/Bowel: Visualized portion unremarkable.

Vascular/Lymphatic: No pathologically enlarged lymph nodes
identified. No abdominal aortic aneurysm.

Other:  None.

Musculoskeletal:  No suspicious bone lesions identified.
IMPRESSION: 2.3 cm benign hemangioma in the left hepatic lobe, which corresponds
with lesion seen on previous CT. No other significant abnormality
identified.

## 2018-10-17 MED ORDER — GADOBUTROL 1 MMOL/ML IV SOLN
10.0000 mL | Freq: Once | INTRAVENOUS | Status: AC | PRN
  Administered 2018-10-17: 10 mL via INTRAVENOUS

## 2018-10-17 NOTE — Progress Notes (Signed)
Inform patient that the abnormality seen on the CAT scan has been reevaluated with the MRI.  It appears that the lesion seen is benign in nature and looks like a big blood vessel.  Nothing more needs to be done about this.  Follow-up in my office to discuss other issues as per last office note.

## 2018-10-18 NOTE — Telephone Encounter (Signed)
PT left vm she had her MRI yesterday  Wants to check on the results and surgery also she wants to check on her Froms to be filled out if we received them

## 2018-10-18 NOTE — Telephone Encounter (Signed)
Called pt to inform her of MRI results and to inform her that we still have not received any forms.  Unable to contact, LVM to return call

## 2018-10-21 ENCOUNTER — Telehealth: Payer: Self-pay | Admitting: Gastroenterology

## 2018-10-21 NOTE — Telephone Encounter (Signed)
Patient called & l/m on v/m stating the medication she was given was not working for her acid reflux so she stopped it. She is experiencing acid reflux & burning.Please advise.

## 2018-10-21 NOTE — Telephone Encounter (Signed)
Pt is calling for her MRI results please call pt

## 2018-10-21 NOTE — Telephone Encounter (Signed)
Spoke with pt and informed her of MRI results. 

## 2018-10-22 NOTE — Telephone Encounter (Signed)
Can you stop prilosec and give her dexilant samples to try- ask her to let us know how it goes in 10-14 days

## 2018-10-23 NOTE — Telephone Encounter (Signed)
Spoke with pt and informed her of Dr. Georgeann Oppenheim suggestion. Pt states she'll wait to pick up the samples during her next office visit on 10-31-18 as she is unable to leave her job.

## 2018-10-31 ENCOUNTER — Ambulatory Visit: Payer: Managed Care, Other (non HMO) | Admitting: Gastroenterology

## 2018-10-31 ENCOUNTER — Other Ambulatory Visit: Payer: Self-pay

## 2018-10-31 ENCOUNTER — Encounter: Payer: Self-pay | Admitting: Gastroenterology

## 2018-10-31 VITALS — BP 153/85 | HR 80 | Temp 98.2°F | Ht 64.0 in | Wt 270.2 lb

## 2018-10-31 DIAGNOSIS — R1011 Right upper quadrant pain: Secondary | ICD-10-CM | POA: Diagnosis not present

## 2018-10-31 DIAGNOSIS — K219 Gastro-esophageal reflux disease without esophagitis: Secondary | ICD-10-CM | POA: Diagnosis not present

## 2018-10-31 NOTE — Patient Instructions (Signed)

## 2018-10-31 NOTE — Progress Notes (Signed)
Jonathon Bellows MD, MRCP(U.K) [address]  [place]  [place], [place] [address]  Main: [phone]  Fax: [phone]   Primary Care Physician: Hubbard Hartshorn, FNP  Primary Gastroenterologist:  Dr. Jonathon Bellows    Follow-up for abnormal LFTs and abdominal pain.  HPI: Tina Stephenson is a 63 y.o. female   She had labs ordered in November 2019 when she had bronchitis and treated 3 times.  At her last visit in 08/29/2018 she had right upper quadrant pain of 1 month duration localized like a knife usually 30 minutes after she eats or relieved.  No NSAID use.  Occasional heartburn and was on 20 mg of omeprazole with food.  Having 1-2 bowel movements a day soft and lighter in color.  She did have relief after bowel movements with gas bloating and abdominal distention.  Last colonoscopy in 2016 - no family , no family history of colon polyps or cancer  06/25/2018: H. pylori breath test: Negative 08/02/2018: Hep B e antigen , surface antigen,HCV ab,Hep Be ag, ,  -negative. PTH,GGT,TSH ,SM ab, AMA, normal  08/02/2018: RUQ USG- fatty liverand 4 mm gall bladder polyp   Alkaline phosphatase 122 fractionated appears normal with individual components. Upper limit of normal of alkaline phosphatase is 117. Has been very minimally elevated over the last few months. With normal transaminases, albumin, total bilirubin  Interval history  09/05/2018-10/31/2018  09/19/2018: HIDA scan shows ejection fraction of 94% which is normal. 10/01/2018: CT scan of the abdomen with contrast: 2.1 x 2.3 hypervascular structure in the left liver 10/17/2018: MRI of the liver showed 2.3 cm benign hemangioma left hepatic lobe  She states that she is not having much right upper quadrant pain but having significant reflux.  Probably has gained weight since the beginning of Covid.  She had been taking Prilosec but she had side effects and stopped taking it.    Current Outpatient Medications  Medication Sig Dispense  Refill  . albuterol (PROVENTIL) (2.5 MG/3ML) 0.083% nebulizer solution Take 3 mLs (2.5 mg total) by nebulization every 6 (six) hours as needed for wheezing or shortness of breath. 150 mL 0  . omeprazole (PRILOSEC) 20 MG capsule Take 1 capsule (20 mg total) by mouth 2 (two) times daily before a meal. 60 capsule 1   No current facility-administered medications for this visit.     Allergies as of 10/31/2018 - Review Complete 10/01/2018  Allergen Reaction Noted  . Penicillins Other (See Comments) 04/14/2017    ROS:  General: Negative for anorexia, weight loss, fever, chills, fatigue, weakness. ENT: Negative for hoarseness, difficulty swallowing , nasal congestion. CV: Negative for chest pain, angina, palpitations, dyspnea on exertion, peripheral edema.  Respiratory: Negative for dyspnea at rest, dyspnea on exertion, cough, sputum, wheezing.  GI: See history of present illness. GU:  Negative for dysuria, hematuria, urinary incontinence, urinary frequency, nocturnal urination.  Endo: Negative for unusual weight change.    Physical Examination:   There were no vitals taken for this visit.  General: Well-nourished, well-developed in no acute distress.  Eyes: No icterus. Conjunctivae pink. Mouth: Oropharyngeal mucosa moist and pink , no lesions erythema or exudate. Lungs: Clear to auscultation bilaterally. Non-labored. Heart: Regular rate and rhythm, no murmurs rubs or gallops.  Abdomen: Bowel sounds are normal, nontender, nondistended, no hepatosplenomegaly or masses, no abdominal bruits or hernia , no rebound or guarding.   Extremities: No lower extremity edema. No clubbing or deformities. Neuro: Alert and oriented x 3.  Grossly intact. Skin:  Warm and dry, no jaundice.   Psych: Alert and cooperative, normal mood and affect.   Imaging Studies: Ct Abdomen W Contrast  Result Date: 10/02/2018 CLINICAL DATA:  Right upper quadrant abdominal pain. EXAM: CT ABDOMEN WITH CONTRAST TECHNIQUE:  Multidetector CT imaging of the abdomen was performed using the standard protocol following bolus administration of intravenous contrast. CONTRAST:  18mL OMNIPAQUE IOHEXOL 300 MG/ML  SOLN COMPARISON:  Ultrasound exam 08/02/2018 FINDINGS: Lower chest: Unremarkable. Hepatobiliary: 2.1 x 2.3 cm hypervascular lesion is identified in the lateral segment left liver (axial 18/series 2 and sagittal 136/series 6). Liver otherwise unremarkable without definite features of steatosis on this study. Gallbladder decompressed. No intrahepatic or extrahepatic biliary dilation. Pancreas: No focal mass lesion. No dilatation of the main duct. No intraparenchymal cyst. No peripancreatic edema. Spleen: No splenomegaly. No focal mass lesion. Adrenals/Urinary Tract: No adrenal nodule or mass. 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 Left kidney unremarkable Stomach/Bowel: Tiny hiatal hernia. Stomach otherwise unremarkable. Duodenum is normally positioned as is the ligament of Treitz. Small duodenal diverticulum evident. No small bowel or colonic dilatation within the visualized abdomen. Vascular/Lymphatic: No abdominal aortic aneurysm. No abdominal aortic atherosclerotic calcification. There is no gastrohepatic or hepatoduodenal ligament lymphadenopathy. No intraperitoneal or retroperitoneal lymphadenopathy. Other: No intraperitoneal free fluid. Musculoskeletal: No worrisome lytic or sclerotic osseous abnormality. IMPRESSION: 1. 2.1 x 2.3 cm hypervascular lesion identified lateral segment left liver. This cannot be further characterized on today's CT scan. Dedicated abdominal MRI with and without contrast recommended to further evaluate. 2. Otherwise no acute findings or features to explain the patient's history of right upper quadrant pain. These results will be called to the ordering clinician or representative by the Radiologist Assistant, and communication documented in the PACS or zVision Dashboard.  Electronically Signed   By: Misty Stanley M.D.   On: 10/02/2018 08:17   Mr Liver W Wo Contrast  Result Date: 10/17/2018 CLINICAL DATA:  Abdominal pain, nausea, and diarrhea. Indeterminate liver lesion on recent CT. EXAM: MRI ABDOMEN WITHOUT AND WITH CONTRAST TECHNIQUE: Multiplanar multisequence MR imaging of the abdomen was performed both before and after the administration of intravenous contrast. CONTRAST:  79mL GADAVIST GADOBUTROL 1 MMOL/ML IV SOLN COMPARISON:  CT on 10/01/2018 FINDINGS: Lower chest: No acute findings. Hepatobiliary: A 2.3 cm mass is seen in the lateral segment left lobe which shows marked T2 hyperintensity and contrast enhancement equivalent to blood pool on all phases, consistent with a benign hemangioma. No other liver masses are identified. Gallbladder is unremarkable. No evidence of biliary ductal dilatation. Pancreas:  No mass or inflammatory changes. Spleen:  Within normal limits in size and appearance. Adrenals/Urinary Tract: No masses identified. No evidence of hydronephrosis. Stomach/Bowel: Visualized portion unremarkable. Vascular/Lymphatic: No pathologically enlarged lymph nodes identified. No abdominal aortic aneurysm. Other:  None. Musculoskeletal:  No suspicious bone lesions identified. 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 No other significant abnormality identified. Electronically Signed   By: Marlaine Hind M.D.   On: 10/17/2018 10:00    Assessment and Plan:   Lakenya Tetro is a 63 y.o. y/o female herwe to follow up for abdominal pain today she states is in the right upper quadrant.  So far evaluation has shown no gross abnormality in her biliary tract except for a small 4 mm gallbladder polyp which I do not really believe is the cause of her pain.  Either way since her last visit the right upper  quadrant pain is significantly better and the main issue today is acid reflux.  She did not tolerate  Prilosec and has been given a sample of Dexilant to see if she tolerates that better.  She has also had elevated alkaline phosphatase in the past and work-up has been negative  likely secondary to NAFLD   Plan :  1. USG abdomen in 1 year to ensure gall bladder polyp size stable.  Recheck LFTs in 4 to 6 months. 2. She has the labs we will check her immune status for hepatitis A and B and vaccinate if needed. 3. Since right upper quadrant pain is stable will not evaluate further.  If it recurs and causes her significant discomfort then may need to consider cholecystectomy. 4. Trial of Dexilant as she failed Prilosec.  Discussed lifestyle changes for acid reflux including weight loss, use of a wedge pillow, avoid eating for 2 to 3 hours before bedtime. 5.  Next colonoscopy due in 2027. Dr Jonathon Bellows  MD,MRCP [facility]) Follow up in 4 weeks

## 2018-11-07 ENCOUNTER — Other Ambulatory Visit: Payer: Self-pay

## 2018-11-07 ENCOUNTER — Telehealth: Payer: Self-pay | Admitting: Gastroenterology

## 2018-11-07 MED ORDER — DEXILANT 60 MG PO CPDR: 60.0000 mg | DELAYED_RELEASE_CAPSULE | Freq: Every day | ORAL | 1 refills | Status: DC

## 2018-11-07 NOTE — Telephone Encounter (Signed)
Pt left vm she states Dr. Vicente Males gave her samples and they are working great she will go by the pharmacy today to see how much her co-pay will be and will call us back later

## 2018-11-07 NOTE — Telephone Encounter (Signed)
Dexilant prescription has been sent to pt preferred pharmacy.

## 2018-11-08 ENCOUNTER — Telehealth: Payer: Self-pay | Admitting: Gastroenterology

## 2018-11-08 MED ORDER — DEXILANT 60 MG PO CPDR: 60.0000 mg | DELAYED_RELEASE_CAPSULE | Freq: Every day | ORAL | 5 refills | Status: DC

## 2018-11-08 NOTE — Telephone Encounter (Signed)
Patient called & has ask that you send in the prescription for dexilant (30 day supply)to [facility] on [address].This way she can find out the co-pay for the medication.

## 2018-11-08 NOTE — Telephone Encounter (Signed)
Please send script

## 2018-11-08 NOTE — Addendum Note (Signed)
Addended by: Dorethea Clan on: 11/08/2018 09:25 AM   Modules accepted: Orders

## 2018-11-08 NOTE — Telephone Encounter (Signed)
Called pt and informed her that the [place] prescription was sent yesterday morning at 11 am. Pt states she'll contact her pharmacy again.

## 2018-12-02 ENCOUNTER — Encounter: Payer: Self-pay | Admitting: Gastroenterology

## 2018-12-02 ENCOUNTER — Ambulatory Visit (INDEPENDENT_AMBULATORY_CARE_PROVIDER_SITE_OTHER): Payer: Managed Care, Other (non HMO) | Admitting: Gastroenterology

## 2018-12-02 ENCOUNTER — Other Ambulatory Visit: Payer: Self-pay

## 2018-12-02 VITALS — BP 175/92 | HR 70 | Temp 97.8°F | Ht 64.0 in | Wt 269.8 lb

## 2018-12-02 DIAGNOSIS — R945 Abnormal results of liver function studies: Secondary | ICD-10-CM

## 2018-12-02 DIAGNOSIS — K219 Gastro-esophageal reflux disease without esophagitis: Secondary | ICD-10-CM

## 2018-12-02 DIAGNOSIS — R7989 Other specified abnormal findings of blood chemistry: Secondary | ICD-10-CM

## 2018-12-02 NOTE — Addendum Note (Signed)
Addended by: Dorethea Clan on: 12/02/2018 03:37 PM   Modules accepted: Orders

## 2018-12-02 NOTE — Progress Notes (Signed)
Jonathon Bellows MD, MRCP(U.K) [address]  [place]  [place], [place] [address]  Main: [phone]  Fax: [phone]   Primary Care Physician: Hubbard Hartshorn, FNP  Primary Gastroenterologist:  Dr. Jonathon Bellows   Follow-up for abnormal LFTs and abdominal pain.  HPI: Tina Stephenson is a 63 y.o. female    Summary of history :  Previous history of minimally elevated alkaline phosphatase.  At hervisit in 08/29/2018 she had right upper quadrant pain of 1 month duration localized like a knife usually 30 minutes after she eats or relieved.  No NSAID use.  Occasional heartburn and was on 20 mg of omeprazole with food.  Having 1-2 bowel movements a day soft and lighter in color.  She did have relief after bowel movements with gas bloating and abdominal distention.  Last colonoscopy in 2016 - no family , no family history of colon polyps or cancer  06/25/2018: H. pylori breath test: Negative 08/02/2018: Hep B e antigen , surface antigen,HCV ab,Hep Be ag, , -negative. PTH,GGT,TSH ,SM ab, AMA, normal  08/02/2018: RUQ USG- fatty liverand 4 mm gall bladder polyp 09/19/2018: HIDA scan shows ejection fraction of 94% which is normal. 10/01/2018: CT scan of the abdomen with contrast: 2.1 x 2.3 hypervascular structure in the left liver 10/17/2018: MRI of the liver showed 2.3 cm benign hemangioma left hepatic lobe  Alkaline phosphatase 122 fractionated appears normal with individual components. Upper limit of normal of alkaline phosphatase is 117. Has been very minimally elevated over the last few months. With normal transaminases, albumin, total bilirubin  Interval history10/22/2020-12/02/2018 Since her last visit and after commencing on Dexilant she has had no abdominal discomfort.  She is elevating the head end of her bed and seems to help her immensely.  Current Outpatient Medications  Medication Sig Dispense Refill  . albuterol (PROVENTIL) (2.5 MG/3ML) 0.083% nebulizer solution Take 3  mLs (2.5 mg total) by nebulization every 6 (six) hours as needed for wheezing or shortness of breath. 150 mL 0  . dexlansoprazole (DEXILANT) 60 MG capsule Take 1 capsule (60 mg total) by mouth daily. 30 capsule 5  . omeprazole (PRILOSEC) 20 MG capsule Take 1 capsule (20 mg total) by mouth 2 (two) times daily before a meal. 60 capsule 1   No current facility-administered medications for this visit.     Allergies as of 12/02/2018 - Review Complete 10/31/2018  Allergen Reaction Noted  . Penicillins Other (See Comments) 04/14/2017    ROS:  General: Negative for anorexia, weight loss, fever, chills, fatigue, weakness. ENT: Negative for hoarseness, difficulty swallowing , nasal congestion. CV: Negative for chest pain, angina, palpitations, dyspnea on exertion, peripheral edema.  Respiratory: Negative for dyspnea at rest, dyspnea on exertion, cough, sputum, wheezing.  GI: See history of present illness. GU:  Negative for dysuria, hematuria, urinary incontinence, urinary frequency, nocturnal urination.  Endo: Negative for unusual weight change.    Physical Examination:   There were no vitals taken for this visit.  General: Well-nourished, well-developed in no acute distress.  Eyes: No icterus. Conjunctivae pink. Mouth: Oropharyngeal mucosa moist and pink , no lesions erythema or exudate. Lungs: Clear to auscultation bilaterally. Non-labored. Heart: Regular rate and rhythm, no murmurs rubs or gallops.  Abdomen: Bowel sounds are normal, nontender, nondistended, no hepatosplenomegaly or masses, no abdominal bruits or hernia , no rebound or guarding.   Extremities: No lower extremity edema. No clubbing or deformities. Neuro: Alert and oriented x 3.  Grossly intact. Skin: Warm and dry, no  jaundice.   Psych: Alert and cooperative, normal mood and affect.   Imaging Studies: No results found.  Assessment and Plan:   Tina Stephenson is a 20 y.o. y/o female here to follow upfor abdominal pain  today she states is in the right upper quadrant along with acid reflux.  So far evaluation has been negative .  She did not tolerate Prilosec and has been given a sample of Dexilant to see if she tolerates that better.  She has also had elevated alkaline phosphatase in the past and work-up has been negative  likely secondary to NAFLD  Plan :  1. USG abdomenin 1 year to ensure gall bladder polyp size stable. Recheck LFTs in 4 to 6 months. 2. Check her immune status for hepatitis A and B and vaccinate if needed. 3. Continue lifestyle changes with head end of the bed elevation, weight loss.  Continue on Dexilant and will plan to decrease the dose at her next visit in 4 months.   Dr Jonathon Bellows  MD,MRCP [facility])

## 2018-12-03 ENCOUNTER — Encounter: Payer: Self-pay | Admitting: Gastroenterology

## 2018-12-03 ENCOUNTER — Telehealth: Payer: Self-pay

## 2018-12-03 LAB — HEPATITIS A ANTIBODY, TOTAL: hep A Total Ab: NEGATIVE

## 2018-12-03 LAB — HEPATITIS B SURFACE ANTIBODY,QUALITATIVE: Hep B Surface Ab, Qual: NONREACTIVE

## 2018-12-03 NOTE — Telephone Encounter (Signed)
Spoke with pt and informed her of lab results and Dr. Georgeann Oppenheim recommendation. Pt agrees and has been scheduled for a nurse visit to receive her first Hep A/B vaccination at our office.

## 2018-12-03 NOTE — Telephone Encounter (Signed)
-----   Message from Jonathon Bellows, MD sent at 12/03/2018  9:08 AM EST ----- Not immune to hep A/B needs vaccine

## 2018-12-03 NOTE — Progress Notes (Signed)
Not immune to hep A/B needs vaccine

## 2019-01-07 ENCOUNTER — Other Ambulatory Visit: Payer: Self-pay

## 2019-01-07 ENCOUNTER — Ambulatory Visit (INDEPENDENT_AMBULATORY_CARE_PROVIDER_SITE_OTHER): Payer: Managed Care, Other (non HMO) | Admitting: Gastroenterology

## 2019-01-07 DIAGNOSIS — Z23 Encounter for immunization: Secondary | ICD-10-CM | POA: Diagnosis not present

## 2019-01-07 DIAGNOSIS — R945 Abnormal results of liver function studies: Secondary | ICD-10-CM

## 2019-01-07 DIAGNOSIS — R7989 Other specified abnormal findings of blood chemistry: Secondary | ICD-10-CM

## 2019-01-07 MED ORDER — DEXILANT 60 MG PO CPDR: 60.0000 mg | DELAYED_RELEASE_CAPSULE | Freq: Every day | ORAL | 3 refills | Status: DC

## 2019-01-24 ENCOUNTER — Encounter: Payer: Self-pay | Admitting: Family Medicine

## 2019-01-24 ENCOUNTER — Ambulatory Visit (INDEPENDENT_AMBULATORY_CARE_PROVIDER_SITE_OTHER): Payer: Managed Care, Other (non HMO) | Admitting: Family Medicine

## 2019-01-24 VITALS — HR 68 | Ht 64.0 in | Wt 266.0 lb

## 2019-01-24 DIAGNOSIS — L304 Erythema intertrigo: Secondary | ICD-10-CM

## 2019-01-24 DIAGNOSIS — Z6841 Body Mass Index (BMI) 40.0 and over, adult: Secondary | ICD-10-CM

## 2019-01-24 DIAGNOSIS — Z8679 Personal history of other diseases of the circulatory system: Secondary | ICD-10-CM

## 2019-01-24 DIAGNOSIS — R351 Nocturia: Secondary | ICD-10-CM | POA: Diagnosis not present

## 2019-01-24 DIAGNOSIS — R7303 Prediabetes: Secondary | ICD-10-CM | POA: Diagnosis not present

## 2019-01-24 DIAGNOSIS — N841 Polyp of cervix uteri: Secondary | ICD-10-CM | POA: Diagnosis not present

## 2019-01-24 NOTE — Progress Notes (Signed)
Name: Tina Stephenson   MRN: [id number]    DOB: 1955/03/14   Date:01/24/2019       Progress Note  Subjective  Chief Complaint  Chief Complaint  Patient presents with  . Follow-up    discuss visit she had with GI and migraines    I connected with  [name] on 01/24/19 at  1:00 PM EST by telephone and verified that I am speaking with the correct person using two identifiers.  I discussed the limitations, risks, security and privacy concerns of performing an evaluation and management service by telephone and the availability of in person appointments. Staff also discussed with the patient that there may be a patient responsible charge related to this service. Patient Location: Home Provider Location: Office Additional Individuals present: None  HPI  Nocturia and urinary stress incontinence: Discussed sleep study with Dr. Ancil Boozer at last visit in our office - ESS was normal, she does not snore, advised to empty bladder prior to bedtime, limit water for about 2-3 hours prior, and work on Kegel exercises.  Morbid obesity/Pre-diabetes: A1C at 6.3% discussed importance of life style modification.  Discussed diet and exercise in detail.  Denies polyuria, polydipsia, or polyphagia.  Cervical polyp: found with examination with Dr. Ancil Boozer at last visit.  She is open to referral to GYN now that things have calmed down with her abdominal pain.  She would like to schedule for sometime in a few months.    Rash under both breasts: Using clotrimazole and hydrocortisone otc prn and this is working well for her  HTN: BP noted to be elevated on 12/02/2018 when at GI office.  She notes that she was very stressed that day.  We will have her come in for BP check in our office to recheck.  Denies chest pain, shortness of breath, BLE edema.  Headache as below.   Migraine: She had significant headache that caused her to see a white streak in the left eye, had follow up with ophthalmologist [facility])  and was told that she likely had had a migraine.  She denies any symptoms since that date - no vision changes, has occasional routine headaches but not like that headache.  Reviewed signs and symptoms of stroke with her in detail.    RUQ pain/Abnormal LFT's: Seeing Dr. Vicente Males with GI for prior history of minimally elevated alkaline phosphatase.  At most recent visit her immune status for Hep A and B were checked, and vaccinations were administered subsequently.  Dr. Vicente Males recommended US abdomen in 1 year to ensure gall bladder polyp stable in size, and recheck of LFT's in 4-6 months with his office.  She is prescribed Dexilant and is working with him to slowly taper off of this over the next 4-6 months.  She is still having some days of nausea and mild diarrhea.  She declines antiemetic today. Still having some RUQ pain and occasional belching.  Patient Active Problem List   Diagnosis Date Noted  . Cervical polyp 08/22/2018  . Nocturia 08/22/2018  . Intertrigo 08/22/2018  . Pre-diabetes 08/22/2018  . 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 History of migraine 03/25/2018  . Scoliosis 03/25/2018  . History of cyst of breast 03/25/2018  . Sacrum and coccyx fracture, sequela 02/21/2018  . Class 3 severe obesity due to excess calories without serious comorbidity with body mass index (BMI) of 45.0 to 49.9 in adult ([place]) 02/21/2018  . Cough 02/21/2018  .  Onychomycosis 02/21/2018  . History of hypertension 02/21/2018  . Vitamin D deficiency 03/19/2015    Past Surgical History:  Procedure Laterality Date  . APPENDECTOMY    . BREAST CYST EXCISION Left    x2  . BREAST CYST EXCISION Right    unable to see scar  . NASAL SEPTUM SURGERY    . WISDOM TOOTH EXTRACTION      Family History  Problem Relation Age of Onset  . COPD Mother   . Cancer Mother   . Heart disease Father   . Breast cancer Paternal Aunt     Social History   Socioeconomic  History  . Marital status: Single    Spouse name: Not on file  . Number of children: 0  . Years of education: Not on file  . Highest education level: Not on file  Occupational History  . Not on file  Tobacco Use  . Smoking status: Never Smoker  . Smokeless tobacco: Never Used  Substance and Sexual Activity  . Alcohol use: Never  . Drug use: Never  . Sexual activity: Not Currently  Other Topics Concern  . Not on file  Social History Narrative  . Not on file   Social Determinants of Health   Financial Resource Strain:   . Difficulty of Paying Living Expenses: Not on file  Food Insecurity: No Food Insecurity  . Worried About Charity fundraiser in the Last Year: Never true  . Ran Out of Food in the Last Year: Never true  Transportation Needs: No Transportation Needs  . Lack of Transportation (Medical): No  . Lack of Transportation (Non-Medical): No  Physical Activity: Insufficiently Active  . Days of Exercise per Week: 5 days  . Minutes of Exercise per Session: 10 min  Stress: No Stress Concern Present  . Feeling of Stress : Not at all  Social Connections: Somewhat Isolated  . Frequency of Communication with Friends and Family: Twice a week  . Frequency of Social Gatherings with Friends and Family: More than three times a week  . Attends Religious Services: 1 to 4 times per year  . Active Member of Clubs or Organizations: No  . Attends Archivist Meetings: Never  . Marital Status: Never married  Intimate Partner Violence: Not At Risk  . Fear of Current or Ex-Partner: No  . Emotionally Abused: No  . Physically Abused: No  . Sexually Abused: No     Current Outpatient Medications:  .  albuterol (PROVENTIL) (2.5 MG/3ML) 0.083% nebulizer solution, Take 3 mLs (2.5 mg total) by nebulization every 6 (six) hours as needed for wheezing or shortness of breath., Disp: 150 mL, Rfl: 0 .  dexlansoprazole (DEXILANT) 60 MG capsule, Take 1 capsule (60 mg total) by mouth  daily., Disp: 90 capsule, Rfl: 3 .  omeprazole (PRILOSEC) 20 MG capsule, Take 1 capsule (20 mg total) by mouth 2 (two) times daily before a meal., Disp: 60 capsule, Rfl: 1  Allergies  Allergen Reactions  . Penicillins Other (See Comments)    "lose my hearing"    I personally reviewed active problem list, medication list, allergies, health maintenance, notes from last encounter, lab results with the patient/caregiver today.   ROS  Constitutional: Negative for fever or weight change.  Respiratory: Negative for cough and shortness of breath.   Cardiovascular: Negative for chest pain or palpitations.  Gastrointestinal: Negative for abdominal pain, no bowel changes.  Musculoskeletal: Negative for gait problem or joint swelling.  Skin: Negative for rash.  Neurological: Negative for dizziness or headache.  No other specific complaints in a complete review of systems (except as listed in HPI above)  Objective  Virtual encounter, vitals not obtained.  Body mass index is 45.66 kg/m.  Physical Exam  Pulmonary/Chest: Effort normal. No respiratory distress. Speaking in complete sentences Neurological: Pt is alert and oriented to person, place, and time. Speech is normal. Psychiatric: Patient has a normal mood and affect. behavior is normal. Judgment and thought content normal.  No results found for this or any previous visit (from the past 72 hour(s)).  PHQ2/9: Depression screen [facility] 2/9 01/24/2019 08/22/2018 06/25/2018 03/25/2018 02/21/2018  Decreased Interest 0 0 0 0 0  Down, Depressed, Hopeless 1 0 0 0 1  PHQ - 2 Score 1 0 0 0 1  Altered sleeping 0 0 0 0 0  Tired, decreased energy 1 0 0 0 1  Change in appetite 0 0 0 0 1  Feeling bad or failure about yourself  1 0 0 0 3  Trouble concentrating 0 0 0 0 0  Moving slowly or fidgety/restless 0 0 0 0 0  Suicidal thoughts 0 0 0 0 0  PHQ-9 Score 3 0 0 0 6  Difficult doing work/chores Not difficult at all - Not difficult at all Not difficult  at all Somewhat difficult   PHQ-2/9 Result is negative.    Fall Risk: Fall Risk  01/24/2019 08/22/2018 06/25/2018 03/25/2018 02/21/2018  Falls in the past year? 0 0 0 0 0  Number falls in past yr: 0 0 0 0 0  Injury with Fall? 0 0 0 0 0  Follow up - - - Falls evaluation completed Falls evaluation completed    Assessment & Plan  1. Nocturia - Improving some with restricting water at night.  2. Cervical polyp - Ambulatory referral to Gynecology  3. Intertrigo - Doing well with OTC topicals  4. Pre-diabetes - A1C well controlled at last check, will check at next follow up  5. Class 3 severe obesity due to excess calories without serious comorbidity with body mass index (BMI) of 45.0 to 49.9 in adult [facility]) Discussed with the patient the risk posed by an increased BMI. Discussed importance of portion control, calorie counting and at least 150 minutes of physical activity weekly. Avoid sweet beverages and drink more water. Eat at least 6 servings of fruit and vegetables daily    6. History of hypertension - BP check in office; she will also recheck at home.  Will need to come in for labs and visit if still elevated.   I discussed the assessment and treatment plan with the patient. The patient was provided an opportunity to ask questions and all were answered. The patient agreed with the plan and demonstrated an understanding of the instructions.   The patient was advised to call back or seek an in-person evaluation if the symptoms worsen or if the condition fails to improve as anticipated.  I provided 22 minutes of non-face-to-face time during this encounter.  Hubbard Hartshorn, FNP

## 2019-01-24 NOTE — Patient Instructions (Addendum)
Food Choices for Gastroesophageal Reflux Disease, Adult When you have gastroesophageal reflux disease (GERD), the foods you eat and your eating habits are very important. Choosing the right foods can help ease your discomfort. Think about working with a nutrition specialist (dietitian) to help you make good choices. What are tips for following this plan?  Meals  Choose healthy foods that are low in fat, such as fruits, vegetables, whole grains, low-fat dairy products, and lean meat, fish, and poultry.  Eat small meals often instead of 3 large meals a day. Eat your meals slowly, and in a place where you are relaxed. Avoid bending over or lying down until 2-3 hours after eating.  Avoid eating meals 2-3 hours before bed.  Avoid drinking a lot of liquid with meals.  Cook foods using methods other than frying. Bake, grill, or broil food instead.  Avoid or limit: ? Chocolate. ? Peppermint or spearmint. ? Alcohol. ? Pepper. ? Black and decaffeinated coffee. ? Black and decaffeinated tea. ? Bubbly (carbonated) soft drinks. ? Caffeinated energy drinks and soft drinks.  Limit high-fat foods such as: ? Fatty meat or fried foods. ? Whole milk, cream, butter, or ice cream. ? Nuts and nut butters. ? Pastries, donuts, and sweets made with butter or shortening.  Avoid foods that cause symptoms. These foods may be different for everyone. Common foods that cause symptoms include: ? Tomatoes. ? Oranges, lemons, and limes. ? Peppers. ? Spicy food. ? Onions and garlic. ? Vinegar. Lifestyle  Maintain a healthy weight. Ask your doctor what weight is healthy for you. If you need to lose weight, work with your doctor to do so safely.  Exercise for at least 30 minutes for 5 or more days each week, or as told by your doctor.  Wear loose-fitting clothes.  Do not smoke. If you need help quitting, ask your doctor.  Sleep with the head of your bed higher than your feet. Use a wedge under the  mattress or blocks under the bed frame to raise the head of the bed. Summary  When you have gastroesophageal reflux disease (GERD), food and lifestyle choices are very important in easing your symptoms.  Eat small meals often instead of 3 large meals a day. Eat your meals slowly, and in a place where you are relaxed.  Limit high-fat foods such as fatty meat or fried foods.  Avoid bending over or lying down until 2-3 hours after eating.  Avoid peppermint and spearmint, caffeine, alcohol, and chocolate. This information is not intended to replace advice given to you by your health care provider. Make sure you discuss any questions you have with your health care provider. Document Revised: 04/18/2018 Document Reviewed: 02/01/2016 Elsevier Patient Education  [place].   Kegel Exercises  Kegel exercises can help strengthen your pelvic floor muscles. The pelvic floor is a group of muscles that support your rectum, small intestine, and bladder. In females, pelvic floor muscles also help support the womb (uterus). These muscles help you control the flow of urine and stool. Kegel exercises are painless and simple, and they do not require any equipment. Your provider may suggest Kegel exercises to:  Improve bladder and bowel control.  Improve sexual response.  Improve weak pelvic floor muscles after surgery to remove the uterus (hysterectomy) or pregnancy (females).  Improve weak pelvic floor muscles after prostate gland removal or surgery (males). Kegel exercises involve squeezing your pelvic floor muscles, which are the same muscles you squeeze when you try to  stop the flow of urine or keep from passing gas. The exercises can be done while sitting, standing, or lying down, but it is best to vary your position. Exercises How to do Kegel exercises: 1. Squeeze your pelvic floor muscles tight. You should feel a tight lift in your rectal area. If you are a female, you should also feel a  tightness in your vaginal area. Keep your stomach, buttocks, and legs relaxed. 2. Hold the muscles tight for up to 10 seconds. 3. Breathe normally. 4. Relax your muscles. 5. Repeat as told by your health care provider. Repeat this exercise daily as told by your health care provider. Continue to do this exercise for at least 4-6 weeks, or for as long as told by your health care provider. You may be referred to a physical therapist who can help you learn more about how to do Kegel exercises. Depending on your condition, your health care provider may recommend:  Varying how long you squeeze your muscles.  Doing several sets of exercises every day.  Doing exercises for several weeks.  Making Kegel exercises a part of your regular exercise routine. This information is not intended to replace advice given to you by your health care provider. Make sure you discuss any questions you have with your health care provider. Document Revised: 08/15/2017 Document Reviewed: 08/15/2017 Elsevier Patient Education  [place].

## 2019-01-26 ENCOUNTER — Encounter: Payer: Self-pay | Admitting: Family Medicine

## 2019-01-26 DIAGNOSIS — I10 Essential (primary) hypertension: Secondary | ICD-10-CM

## 2019-01-27 MED ORDER — LISINOPRIL 10 MG PO TABS: 10.0000 mg | ORAL_TABLET | Freq: Every day | ORAL | 0 refills | Status: DC

## 2019-02-03 ENCOUNTER — Ambulatory Visit (INDEPENDENT_AMBULATORY_CARE_PROVIDER_SITE_OTHER): Payer: Managed Care, Other (non HMO) | Admitting: Gastroenterology

## 2019-02-03 ENCOUNTER — Other Ambulatory Visit: Payer: Self-pay

## 2019-02-03 ENCOUNTER — Other Ambulatory Visit: Payer: Self-pay | Admitting: Emergency Medicine

## 2019-02-03 ENCOUNTER — Ambulatory Visit: Payer: Managed Care, Other (non HMO)

## 2019-02-03 VITALS — BP 128/90 | HR 78 | Temp 96.8°F | Resp 16 | Ht 64.0 in | Wt 270.2 lb

## 2019-02-03 DIAGNOSIS — I10 Essential (primary) hypertension: Secondary | ICD-10-CM

## 2019-02-03 DIAGNOSIS — Z23 Encounter for immunization: Secondary | ICD-10-CM | POA: Diagnosis not present

## 2019-02-03 DIAGNOSIS — R7989 Other specified abnormal findings of blood chemistry: Secondary | ICD-10-CM

## 2019-02-03 DIAGNOSIS — R945 Abnormal results of liver function studies: Secondary | ICD-10-CM

## 2019-02-03 NOTE — Progress Notes (Signed)
Pt here for blood pressure check 1st reading was 148/82 and after sitting for 5 mins bp was 128/90.  Pt is currently on lisinopril 10mg  and denies any side effects.  Will forward to [facility] for any changes.

## 2019-02-03 NOTE — Progress Notes (Signed)
Documentation reviewed; advised continue lisinopril, BP down to normal range after 5 minutes of rest.  Will obtain BMP.

## 2019-02-03 NOTE — Addendum Note (Signed)
Addended by: Docia Furl on: 02/03/2019 09:54 AM   Modules accepted: Orders

## 2019-02-04 LAB — BASIC METABOLIC PANEL
BUN/Creatinine Ratio: 21 (ref 12–28)
BUN: 14 mg/dL (ref 8–27)
CO2: 23 mmol/L (ref 20–29)
Calcium: 9.6 mg/dL (ref 8.7–10.3)
Chloride: 104 mmol/L (ref 96–106)
Creatinine, Ser: 0.67 mg/dL (ref 0.57–1.00)
GFR calc Af Amer: 110 mL/min/{1.73_m2} (ref >59)
GFR calc non Af Amer: 95 mL/min/{1.73_m2} (ref >59)
Glucose: 96 mg/dL (ref 65–99)
Potassium: 4.4 mmol/L (ref 3.5–5.2)
Sodium: 141 mmol/L (ref 134–144)

## 2019-02-21 ENCOUNTER — Telehealth: Payer: Self-pay

## 2019-02-21 ENCOUNTER — Encounter: Payer: Self-pay | Admitting: Family Medicine

## 2019-02-21 DIAGNOSIS — I10 Essential (primary) hypertension: Secondary | ICD-10-CM

## 2019-02-21 MED ORDER — LISINOPRIL 10 MG PO TABS: 10.0000 mg | ORAL_TABLET | Freq: Every day | ORAL | 0 refills | Status: DC

## 2019-03-14 ENCOUNTER — Other Ambulatory Visit: Payer: Self-pay

## 2019-03-14 ENCOUNTER — Encounter: Payer: Self-pay | Admitting: Certified Nurse Midwife

## 2019-03-14 ENCOUNTER — Ambulatory Visit: Payer: Managed Care, Other (non HMO) | Admitting: Certified Nurse Midwife

## 2019-03-14 ENCOUNTER — Other Ambulatory Visit (HOSPITAL_COMMUNITY)
Admission: RE | Admit: 2019-03-14 | Discharge: 2019-03-14 | Disposition: A | Discharge status: Home / Self Care | Payer: Managed Care, Other (non HMO) | Source: Ambulatory Visit | Attending: Certified Nurse Midwife | Admitting: Certified Nurse Midwife

## 2019-03-14 VITALS — BP 133/69 | HR 89 | Ht 63.0 in

## 2019-03-14 DIAGNOSIS — N841 Polyp of cervix uteri: Secondary | ICD-10-CM | POA: Insufficient documentation

## 2019-03-14 DIAGNOSIS — Z8742 Personal history of other diseases of the female genital tract: Secondary | ICD-10-CM

## 2019-03-14 NOTE — Progress Notes (Signed)
Patient was referred by PCP due to having a cervical polyp.  Patient c/o "I feel something moving inside my uterus", noticed some light spotting yesterday that lasted "a couple minutes", no pain.

## 2019-03-14 NOTE — Patient Instructions (Signed)

## 2019-03-17 NOTE — Progress Notes (Signed)
Cervical polyp removal  Tina Stephenson is a 64 y.o. year old Caucasian female here for cervical polyp removal. Polyp seen by PCP during annual exam.   BP 133/69   Pulse 89   Ht 5\' 3"  (1.6 m)   BMI 47.86 kg/m   Verbal consent obtained for removal of possible cervical polyp from os. Polyp grasp with ring forcep and small portion removed without difficulty. Silver nitrate used to stop bleeding. No second attempt made to remove rest of polyp. Specimen to pathology, see orders  Reviewed red flag symptoms and when to call.   RTC as needed.    Diona Fanti, CNM [facility], [facility]

## 2019-03-18 ENCOUNTER — Other Ambulatory Visit: Payer: Self-pay

## 2019-03-19 LAB — SURGICAL PATHOLOGY

## 2019-03-20 ENCOUNTER — Telehealth: Payer: Self-pay | Admitting: Certified Nurse Midwife

## 2019-03-20 NOTE — Telephone Encounter (Signed)
Pt called in and stated that she is wanting to go over her labs and that she has checked but she hasnt got anything in her [facility] portal. The pt is requesting a call back. Please advise

## 2019-03-21 NOTE — Telephone Encounter (Signed)
MyChart message sent.  See 03/21/2019 MyChart message for response.

## 2019-04-08 ENCOUNTER — Other Ambulatory Visit: Payer: Self-pay

## 2019-04-08 ENCOUNTER — Ambulatory Visit: Payer: Managed Care, Other (non HMO) | Admitting: Gastroenterology

## 2019-04-08 VITALS — BP 155/77 | HR 71 | Temp 98.2°F | Ht 64.0 in | Wt 267.2 lb

## 2019-04-08 DIAGNOSIS — R945 Abnormal results of liver function studies: Secondary | ICD-10-CM | POA: Diagnosis not present

## 2019-04-08 DIAGNOSIS — R197 Diarrhea, unspecified: Secondary | ICD-10-CM | POA: Diagnosis not present

## 2019-04-08 DIAGNOSIS — R7989 Other specified abnormal findings of blood chemistry: Secondary | ICD-10-CM

## 2019-04-08 MED ORDER — DEXLANSOPRAZOLE 30 MG PO CPDR: 30.0000 mg | DELAYED_RELEASE_CAPSULE | Freq: Every day | ORAL | 1 refills | Status: DC

## 2019-04-08 NOTE — Addendum Note (Signed)
Addended by: Dorethea Clan on: 04/08/2019 03:45 PM   Modules accepted: Orders

## 2019-04-08 NOTE — Progress Notes (Signed)
Jonathon Bellows MD, MRCP(U.K) [address]  [place]  [place], [place] [address]  Main: [phone]  Fax: [phone]   Primary Care Physician: Hubbard Hartshorn, FNP  Primary Gastroenterologist:  Dr. Jonathon Bellows   Here to follow-up for abdominal pain, GERD  HPI: Tina Stephenson is a 64 y.o. female    Summary of history :  Previous history of minimally elevated alkaline phosphatase.  At hervisit in 08/29/2018 she had right upper quadrant pain of 1 month duration localized like a knife usually 30 minutes after she eats or relieved. No NSAID use. Occasional heartburn and was on 20 mg of omeprazole with food. Having 1-2 bowel movements a day soft and lighter in color. She did have relief after bowel movements with gas bloating and abdominal distention.  Last colonoscopy in 2016 - no family , no family history of colon polyps or cancer  06/25/2018: H. pylori breath test: Negative 08/02/2018: Hep B e antigen , surface antigen,HCV ab,Hep Be ag, , -negative. PTH,GGT,TSH ,SM ab, AMA, normal  08/02/2018: RUQ USG- fatty liverand 4 mm gall bladder polyp 09/19/2018: HIDA scan shows ejection fraction of 94% which is normal. 10/01/2018: CT scan of the abdomen with contrast: 2.1 x 2.3 hypervascular structure in the left liver 10/17/2018: MRI of the liver showed 2.3 cm benign hemangioma left hepatic lobe  Alkaline phosphatase 122 fractionated appears normal with individual components. Upper limit of normal of alkaline phosphatase is 117. Has been very minimally elevated over the last few months. With normal transaminases, albumin, total bilirubin  Interval history11/23/2020-04/08/2019 12/02/2018: Hepatitis A total antibody and B surface antibody negative.  Advised to get vaccinated  Taking her Dexilant no abdominal pain.  1 more dose of hepatitis A B vaccine to be completed later in the year.  Since she is here today she has had 3 days of abdominal pain with diarrhea.  It is improving.   No fever.  No blood in the stools.  No vomiting.   Current Outpatient Medications  Medication Sig Dispense Refill  . albuterol (PROVENTIL) (2.5 MG/3ML) 0.083% nebulizer solution Take 3 mLs (2.5 mg total) by nebulization every 6 (six) hours as needed for wheezing or shortness of breath. 150 mL 0  . dexlansoprazole (DEXILANT) 60 MG capsule Take 1 capsule (60 mg total) by mouth daily. 90 capsule 3  . lisinopril (ZESTRIL) 10 MG tablet Take 1 tablet (10 mg total) by mouth daily. 90 tablet 0   No current facility-administered medications for this visit.    Allergies as of 04/08/2019 - Review Complete 03/14/2019  Allergen Reaction Noted  . Penicillins Other (See Comments) 04/14/2017    ROS:  General: Negative for anorexia, weight loss, fever, chills, fatigue, weakness. ENT: Negative for hoarseness, difficulty swallowing , nasal congestion. CV: Negative for chest pain, angina, palpitations, dyspnea on exertion, peripheral edema.  Respiratory: Negative for dyspnea at rest, dyspnea on exertion, cough, sputum, wheezing.  GI: See history of present illness. GU:  Negative for dysuria, hematuria, urinary incontinence, urinary frequency, nocturnal urination.  Endo: Negative for unusual weight change.    Physical Examination:   There were no vitals taken for this visit.  General: Well-nourished, well-developed in no acute distress.  Eyes: No icterus. Conjunctivae pink. Abdomen: Bowel sounds are normal, nontender, nondistended, no hepatosplenomegaly or masses, no abdominal bruits or hernia , no rebound or guarding.   Neuro: Alert and oriented x 3.  Grossly intact. Skin: Warm and dry, no jaundice.   Psych: Alert and cooperative, normal mood  and affect.   Imaging Studies: No results found.  Assessment and Plan:   Tina Stephenson is a 23 y.o. y/o female here to follow upfor abdominal pain today which completely resolved after commencing on Dexilant.  Did not tolerate Prilosec.  She has NAFLD.   Gallbladder polyp that is being monitored for size.  Mildly elevated alkaline phosphatase that is under surveillance.  Presently has acute diarrhea of 3 days duration likely infectious most likely self-limiting clinically she is improving.  Plan :  1. USG abdomenin 1 year i.e. October 2021 to ensure gall bladder polyp size stable. Recheck LFTs in 4 to 6 months. 2. Complete vaccination for hepatitis A and B 3. Diarrhea: Since 3 days.  No fever suggest conservative management.  Likely infectious.  If does not resolve in 7 to 10 days to call my office and we will obtain stool studies.  In the meanwhile suggest high-fiber diet and can consume plain yogurt.  Keep hydrated.   Dr Jonathon Bellows  MD,MRCP [facility]) Follow up 8 months months

## 2019-04-08 NOTE — Patient Instructions (Signed)

## 2019-04-09 LAB — GAMMA GT: GGT: 83 IU/L — ABNORMAL HIGH (ref 0–60)

## 2019-04-09 LAB — HEPATIC FUNCTION PANEL
ALT: 24 IU/L (ref 0–32)
AST: 17 IU/L (ref 0–40)
Albumin: 4.6 g/dL (ref 3.8–4.8)
Alkaline Phosphatase: 147 IU/L — ABNORMAL HIGH (ref 39–117)
Bilirubin Total: 1.4 mg/dL — ABNORMAL HIGH (ref 0.0–1.2)
Bilirubin, Direct: 0.31 mg/dL (ref 0.00–0.40)
Total Protein: 6.8 g/dL (ref 6.0–8.5)

## 2019-04-09 LAB — CK: Total CK: 74 U/L (ref 32–182)

## 2019-04-14 ENCOUNTER — Telehealth: Payer: Self-pay

## 2019-04-14 NOTE — Telephone Encounter (Signed)
Spoke with pt and informed her of lab results and Dr. Georgeann Oppenheim recommendation. Pt agrees but request's Dr. Georgeann Oppenheim advice on what she could do to bring down her alkaline phosphatase level?

## 2019-04-14 NOTE — Telephone Encounter (Signed)
-----   Message from Jonathon Bellows, MD sent at 04/10/2019  2:39 PM EDT ----- LFTs mildly elevated but stable.  Repeat in 6 to 7 months before next office visit

## 2019-04-14 NOTE — Telephone Encounter (Signed)
Called pt to inform her of lab results and Dr. Anna's recommendation. ° °Unable to contact, LVM to return call °

## 2019-04-15 NOTE — Telephone Encounter (Signed)
Likely due to NAFLD and only Rx so far is to exercise, less fatty foods, mediterranean diet . Can you send diet information and information on NAFLD. There are no speicific medications.

## 2019-04-27 ENCOUNTER — Other Ambulatory Visit: Payer: Self-pay

## 2019-04-27 ENCOUNTER — Emergency Department
Admission: EM | Admit: 2019-04-27 | Discharge: 2019-04-27 | Disposition: A | Discharge status: Home / Self Care | Payer: Managed Care, Other (non HMO) | Attending: Emergency Medicine | Admitting: Emergency Medicine

## 2019-04-27 ENCOUNTER — Emergency Department: Payer: Managed Care, Other (non HMO)

## 2019-04-27 ENCOUNTER — Encounter: Payer: Self-pay | Admitting: Emergency Medicine

## 2019-04-27 DIAGNOSIS — S5011XA Contusion of right forearm, initial encounter: Secondary | ICD-10-CM | POA: Insufficient documentation

## 2019-04-27 DIAGNOSIS — W19XXXA Unspecified fall, initial encounter: Secondary | ICD-10-CM | POA: Diagnosis not present

## 2019-04-27 DIAGNOSIS — Y999 Unspecified external cause status: Secondary | ICD-10-CM | POA: Diagnosis not present

## 2019-04-27 DIAGNOSIS — I10 Essential (primary) hypertension: Secondary | ICD-10-CM | POA: Diagnosis not present

## 2019-04-27 DIAGNOSIS — Y929 Unspecified place or not applicable: Secondary | ICD-10-CM | POA: Insufficient documentation

## 2019-04-27 DIAGNOSIS — Y93D9 Activity, other involving arts and handcrafts: Secondary | ICD-10-CM | POA: Insufficient documentation

## 2019-04-27 DIAGNOSIS — S59911A Unspecified injury of right forearm, initial encounter: Secondary | ICD-10-CM | POA: Diagnosis present

## 2019-04-27 DIAGNOSIS — Z79899 Other long term (current) drug therapy: Secondary | ICD-10-CM | POA: Diagnosis not present

## 2019-04-27 HISTORY — DX: Gastro-esophageal reflux disease without esophagitis: K21.9

## 2019-04-27 HISTORY — DX: Benign neoplasm of liver: D13.4

## 2019-04-27 HISTORY — DX: Cholesterolosis of gallbladder: K82.4

## 2019-04-27 IMAGING — DX DG FOREARM 2V*R*
2 series · 2 of 2 positions shown · non-contrast
Comparison: None.

CLINICAL DATA: Right forearm swelling following an injury. Right
wrist pain.

EXAM:
RIGHT FOREARM - 2 VIEW

[forearm ap]
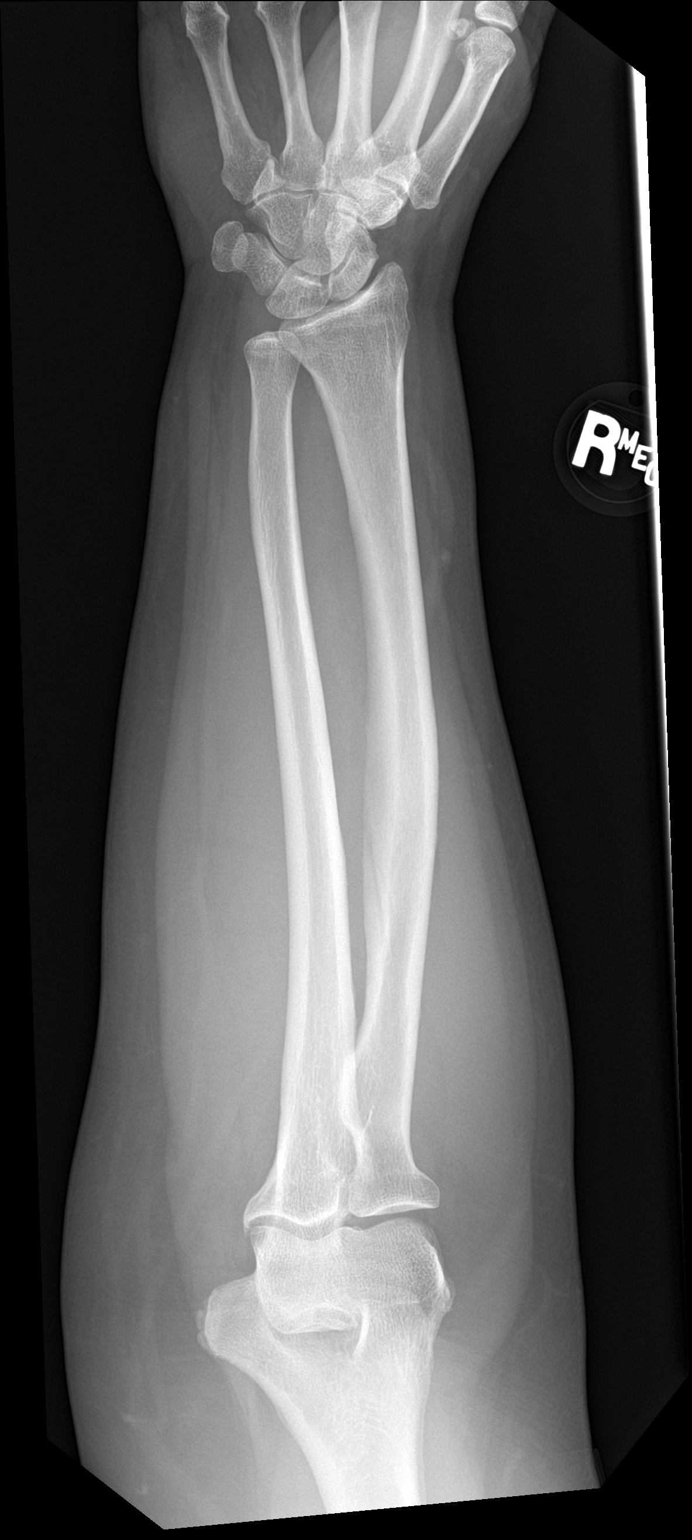

[forearm lat]
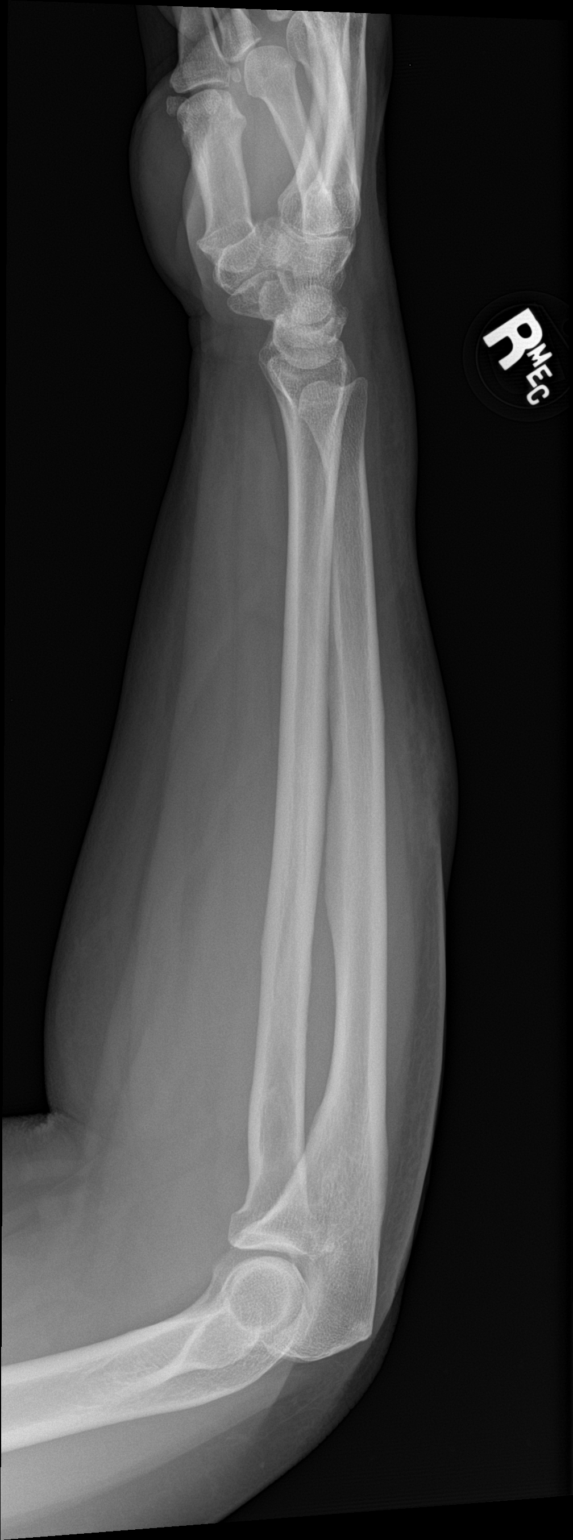

[2 of 2 positions shown; findings below may reference images not displayed]

FINDINGS: Mild dorsal soft tissue swelling, most pronounced at the level of
the mid forearm. No fracture, dislocation or radiopaque foreign body
seen.
IMPRESSION: Mild dorsal soft tissue swelling without fracture.

## 2019-04-27 MED ORDER — CYCLOBENZAPRINE HCL 5 MG PO TABS: 5.0000 mg | ORAL_TABLET | Freq: Three times a day (TID) | ORAL | 0 refills | Status: DC | PRN

## 2019-04-27 NOTE — ED Provider Notes (Signed)
[facility] Emergency Department Provider Note ____________________________________________  Time seen: 1417  I have reviewed the triage vital signs and the nursing notes.  HISTORY  Chief Complaint  Arm Injury  HPI Tina Stephenson is a 64 y.o. right-handed female presents to the ED for evaluation of right forearm pain after a mechanical fall. She was drilling a wooden board, that gave-way while she was leaning on it. She fell landing on her right forearm. She presents with pain and swelling to the forearm. No wrist pain or deformity reported.  She denies any head injury, loss of consciousness, nausea, vomiting, dizziness.  She denies any head injury, loss of consciousness, nausea, vomiting, or dizziness.  Past Medical History:  Diagnosis Date  . Depression   . Gallbladder polyp   . GERD (gastroesophageal reflux disease)   . Hypertension   . Hypertensive disorder 03/05/2015  . Liver tumor (benign)   . Sacrum and coccyx fracture [facility] [place])     Patient Active Problem List   Diagnosis Date Noted  . Cervical polyp 08/22/2018  . Nocturia 08/22/2018  . Intertrigo 08/22/2018  . Pre-diabetes 08/22/2018  . 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 History of migraine 03/25/2018  . Scoliosis 03/25/2018  . History of cyst of breast 03/25/2018  . Sacrum and coccyx fracture, sequela 02/21/2018  . Class 3 severe obesity due to excess calories without serious comorbidity with body mass index (BMI) of 45.0 to 49.9 in adult ([place]) 02/21/2018  . Cough 02/21/2018  . Onychomycosis 02/21/2018  . History of hypertension 02/21/2018  . Vitamin D deficiency 03/19/2015    Past Surgical History:  Procedure Laterality Date  . APPENDECTOMY    . BREAST CYST EXCISION Left    x2  . BREAST CYST EXCISION Right    unable to see scar  . NASAL SEPTUM SURGERY    . WISDOM TOOTH EXTRACTION      Prior to Admission medications   Medication  Sig Start Date End Date Taking? Authorizing Provider  albuterol (PROVENTIL) (2.5 MG/3ML) 0.083% nebulizer solution Take 3 mLs (2.5 mg total) by nebulization every 6 (six) hours as needed for wheezing or shortness of breath. 06/25/18   Hubbard Hartshorn, FNP  cyclobenzaprine (FLEXERIL) 5 MG tablet Take 1 tablet (5 mg total) by mouth 3 (three) times daily as needed. 04/27/19   Shawntae Lowy, Dannielle Karvonen, PA-C  Dexlansoprazole 30 MG capsule Take 1 capsule (30 mg total) by mouth daily. 04/08/19   Jonathon Bellows, MD  lisinopril (ZESTRIL) 10 MG tablet Take 1 tablet (10 mg total) by mouth daily. 02/21/19   Delsa Grana, PA-C    Allergies Penicillins  Family History  Problem Relation Age of Onset  . COPD Mother   . Cancer Mother   . Heart disease Father   . Breast cancer Paternal Aunt   . Ovarian cancer Neg Hx   . Colon cancer Neg Hx     Social History Social History   Tobacco Use  . Smoking status: Never Smoker  . Smokeless tobacco: Never Used  Substance Use Topics  . Alcohol use: Never  . Drug use: Never    Review of Systems  Constitutional: Negative for fever. Cardiovascular: Negative for chest pain. Respiratory: Negative for shortness of breath. Musculoskeletal: Negative for back pain. Right forearm pain Skin: Negative for rash. Neurological: Negative for headaches, focal weakness or numbness. ____________________________________________  PHYSICAL EXAM:  VITAL SIGNS: ED Triage Vitals  Enc Vitals Group  BP 04/27/19 1350 (!) 150/78     Pulse Rate 04/27/19 1350 98     Resp 04/27/19 1350 18     Temp 04/27/19 1350 98.2 F (36.8 C)     Temp Source 04/27/19 1350 Oral     SpO2 04/27/19 1350 97 %     Weight 04/27/19 1345 262 lb (118.8 kg)     Height 04/27/19 1345 5\' 4"  (1.626 m)     Head Circumference --      Peak Flow --      Pain Score 04/27/19 1345 9     Pain Loc --      Pain Edu? --      Excl. in [place]? --     Constitutional: Alert and oriented. Well appearing and in no  distress. Head: Normocephalic and atraumatic. Eyes: Conjunctivae are normal. Normal extraocular movements Cardiovascular: Normal rate, regular rhythm. Normal distal pulses. Respiratory: Normal respiratory effort. No wheezes/rales/rhonchi. Gastrointestinal: Soft and nontender. No distention. Musculoskeletal: Right forearm without any obvious deformity or dislocation.  Patient with focal soft tissue swelling and early hematoma formation to the dorsal lateral aspect of the right forearm.  Normal composite fist distally normal wrist flexion extension range.  Elbow exam without any signs of internal derangement.  Nontender with normal range of motion in all extremities.  Neurologic: Cranial nerves II through XII grossly intact.  Normal gross sensation.  Normal speech and language. No gross focal neurologic deficits are appreciated. Skin:  Skin is warm, dry and intact. No rash noted.  No abrasion, laceration, or puncture wound appreciated. ____________________________________________   RADIOLOGY  DG Left Forearm  Negative ____________________________________________  PROCEDURES  Jones-Watson compression wrap applied   Procedures ____________________________________________  INITIAL IMPRESSION / ASSESSMENT AND PLAN / ED COURSE  Patient with ED evaluation of injury sustained following a mechanical fall.  Patient describes landing on her right forearm.  She sustained a contusion resulted in a hematoma to the forearm.  No radiologic evidence of fracture or dislocation.  No other injuries reported at this time.  Patient is placed in a soft Ace bandage with cotton padding for comfort and support.  She will follow-up with her primary provider as needed.  She will take a prescription for Flexeril as needed as well as over-the-counter Tylenol.  A work note is provided for 2 days as requested.  Tina Stephenson was evaluated in Emergency Department on 04/27/2019 for the symptoms described in the history of  present illness. She was evaluated in the context of the global COVID-19 pandemic, which necessitated consideration that the patient might be at risk for infection with the SARS-CoV-2 virus that causes COVID-19. Institutional protocols and algorithms that pertain to the evaluation of patients at risk for COVID-19 are in a state of rapid change based on information released by regulatory bodies including the CDC and federal and state organizations. These policies and algorithms were followed during the patient's care in the ED. ____________________________________________  FINAL CLINICAL IMPRESSION(S) / ED DIAGNOSES  Final diagnoses:  Fall at home, initial encounter  Contusion of right forearm, initial encounter  Traumatic hematoma of forearm, right, initial encounter      Melvenia Needles, PA-C 04/27/19 1653    Harvest Dark, MD 04/28/19 [phone]

## 2019-04-27 NOTE — ED Triage Notes (Signed)
FIRST NURSE NOTE- here for fall. Was drilling a piece of wood and fell over it landing on right arm. Swelling noted.  Ambulatory.

## 2019-04-27 NOTE — Discharge Instructions (Addendum)
Your exam and XR are normal and reassuring at this time. Wear the ace bandage as needed for comfort and support. Take OTC Tylenol as needed. Take the muscle relaxant as needed.

## 2019-04-27 NOTE — ED Triage Notes (Signed)
Swelling to right forearm after board gave way while pt was drilling on it.  Pain to arm. Mild swelling also noted to hand.

## 2019-04-27 NOTE — ED Notes (Signed)
Signature pad in room not working- pt verbalized understanding of discharge instructions 

## 2019-04-27 NOTE — ED Notes (Signed)
See triage note  Presents with injury to right forearm  States she was working with a drill   The board gave away  Pain and swelling noted to forearm  Min swelling noted to wrist  Good pulses

## 2019-05-19 ENCOUNTER — Other Ambulatory Visit: Payer: Self-pay

## 2019-05-19 DIAGNOSIS — I10 Essential (primary) hypertension: Secondary | ICD-10-CM

## 2019-05-19 MED ORDER — LISINOPRIL 10 MG PO TABS: 10.0000 mg | ORAL_TABLET | Freq: Every day | ORAL | 0 refills | Status: DC

## 2019-05-21 NOTE — Telephone Encounter (Signed)
error 

## 2019-07-04 ENCOUNTER — Other Ambulatory Visit: Payer: Self-pay

## 2019-07-04 ENCOUNTER — Ambulatory Visit (INDEPENDENT_AMBULATORY_CARE_PROVIDER_SITE_OTHER): Payer: Managed Care, Other (non HMO) | Admitting: Gastroenterology

## 2019-07-04 DIAGNOSIS — Z23 Encounter for immunization: Secondary | ICD-10-CM

## 2019-07-04 DIAGNOSIS — R7989 Other specified abnormal findings of blood chemistry: Secondary | ICD-10-CM

## 2019-08-04 ENCOUNTER — Encounter: Payer: Managed Care, Other (non HMO) | Admitting: Family Medicine

## 2019-08-06 ENCOUNTER — Telehealth: Payer: Self-pay

## 2019-08-06 DIAGNOSIS — K824 Cholesterolosis of gallbladder: Secondary | ICD-10-CM

## 2019-08-06 NOTE — Telephone Encounter (Signed)
Called patient and informed patient that she was due for 1 year repeat US Abdomen complete. Patient states she is willing to have this done and she can do it any day in August but 08/15/2019. 08/12/2019 at10:15am. At out patient imaging. Arrived at 10:00am. Nothing to eat or drink after midnight. Informed patient and she verbalized understanding

## 2019-08-06 NOTE — Telephone Encounter (Signed)
-----   Message from Rushie Chestnut, [place] sent at 08/06/2018 10:26 AM EDT ----- Regarding: Pt due for repeat U/S Pt is due for 1-year repeat abdominal U/S to check for stability of gallbladder polyp

## 2019-08-12 ENCOUNTER — Other Ambulatory Visit: Payer: Self-pay

## 2019-08-12 ENCOUNTER — Ambulatory Visit
Admission: RE | Admit: 2019-08-12 | Discharge: 2019-08-12 | Disposition: A | Discharge status: Home / Self Care | Payer: Managed Care, Other (non HMO) | Source: Ambulatory Visit | Attending: Gastroenterology | Admitting: Gastroenterology

## 2019-08-12 DIAGNOSIS — K824 Cholesterolosis of gallbladder: Secondary | ICD-10-CM | POA: Insufficient documentation

## 2019-08-12 IMAGING — US US ABDOMEN COMPLETE
1 series · 13 of 25 positions shown · non-contrast
Comparison: [DATE]

CLINICAL DATA: Gallbladder polyp

EXAM:
ABDOMEN ULTRASOUND COMPLETE

[Series 1: us abdomen complete · 0.24mm/px · 13 of 79 slices shown]
[im 1/79]
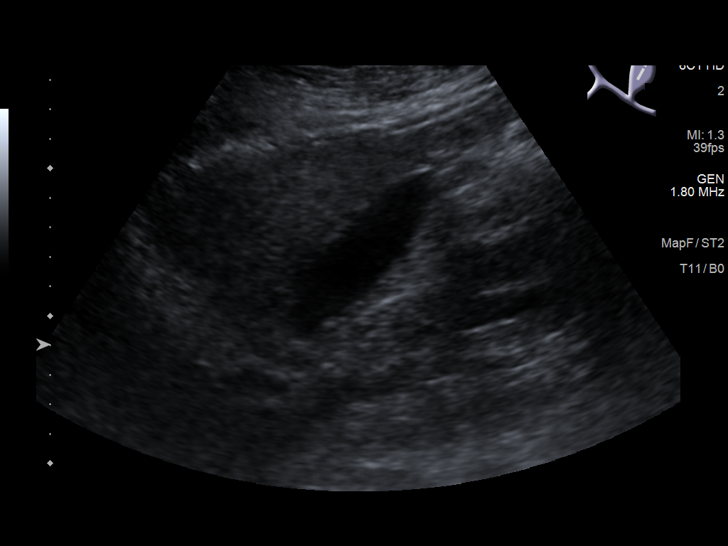
[im 7/79]
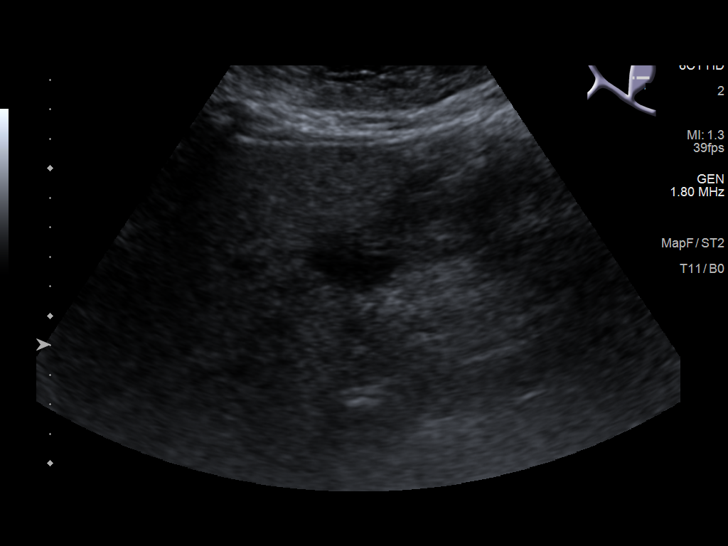
[im 14/79]
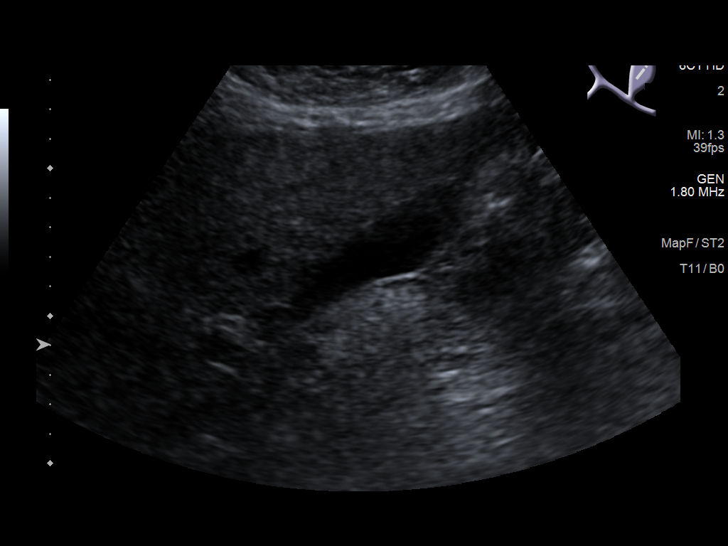
[im 20/79]
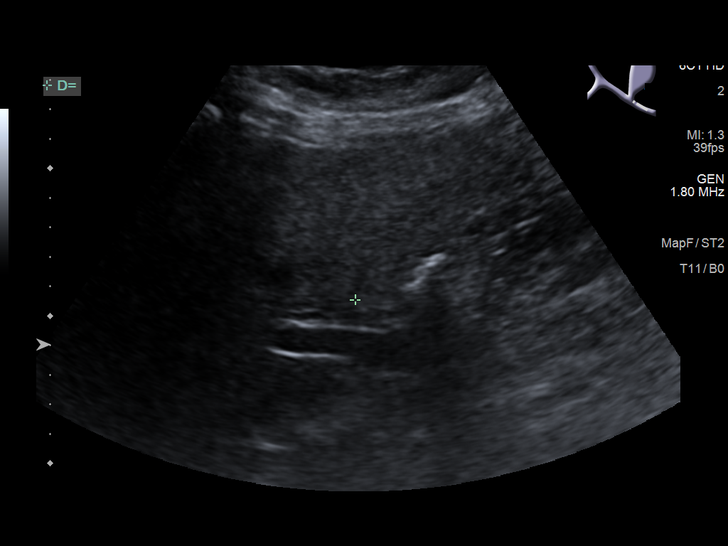
[im 27/79]
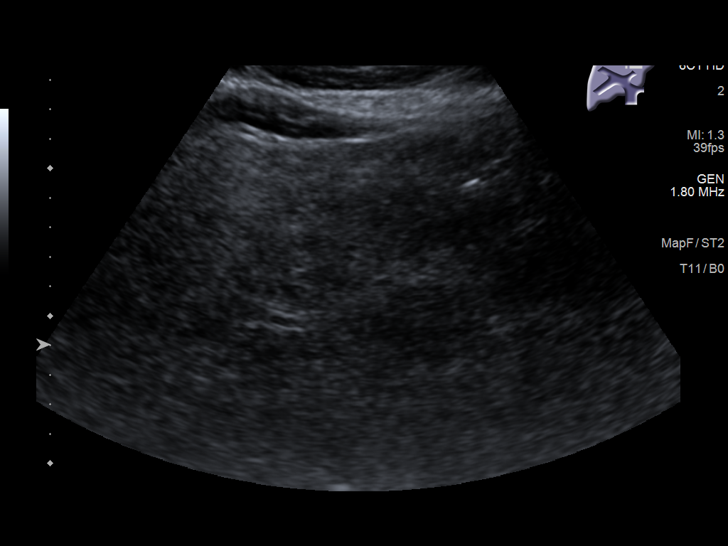
[im 33/79]
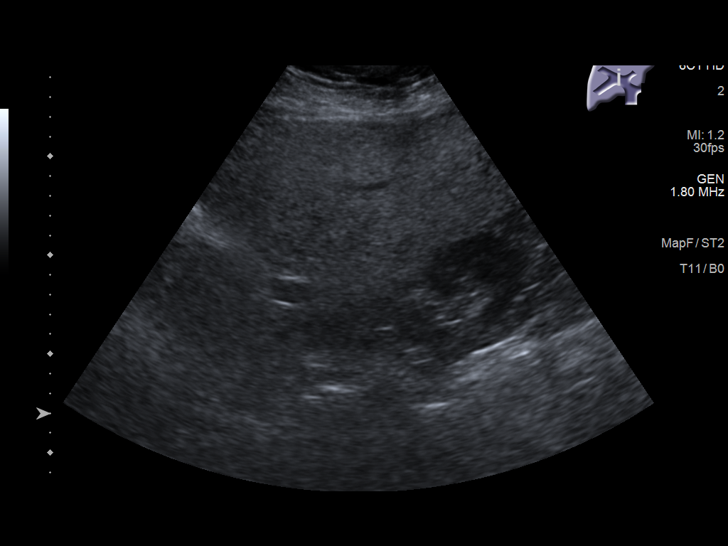
[im 40/79]
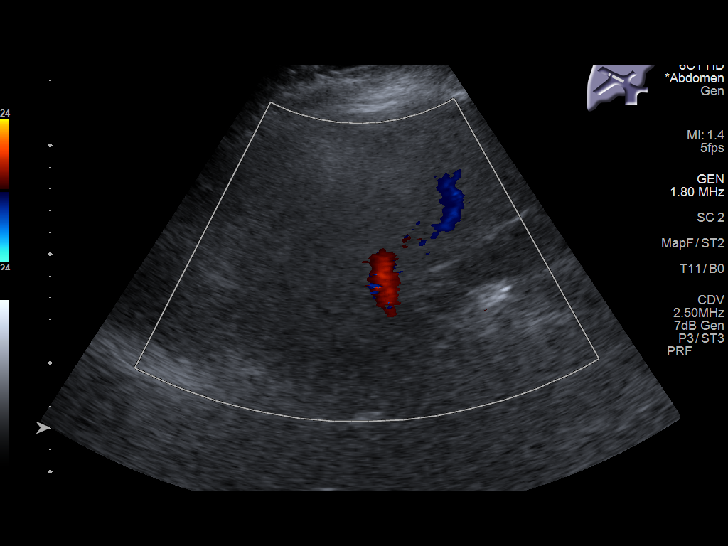
[im 46/79]
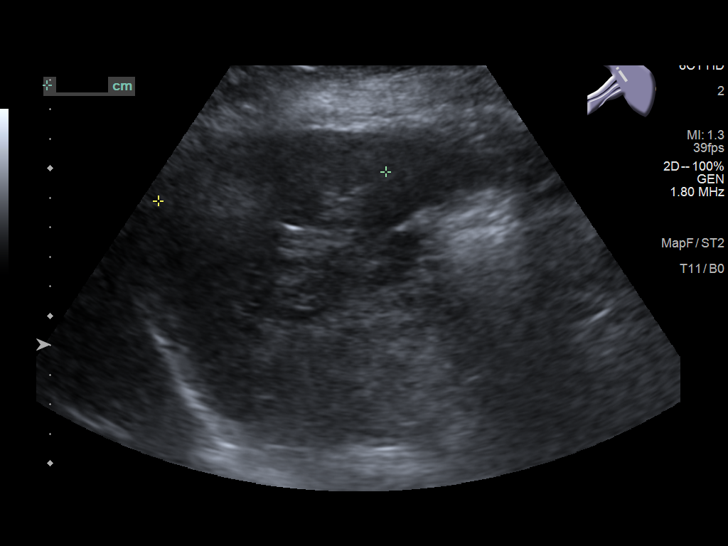
[im 53/79]
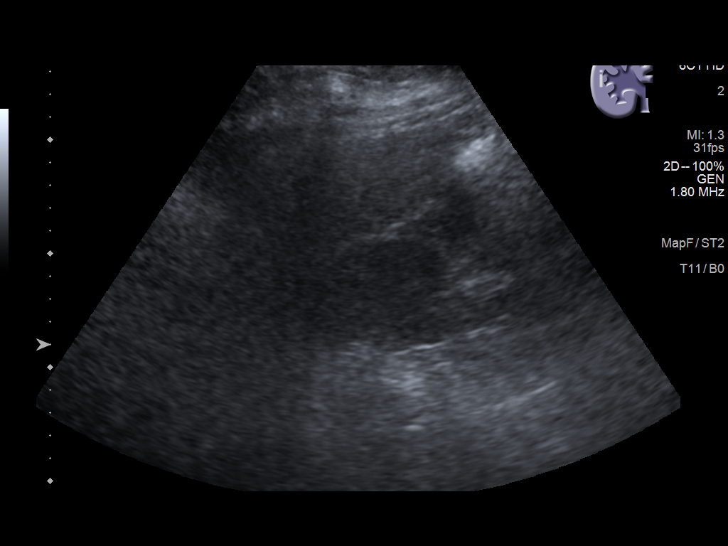
[im 59/79]
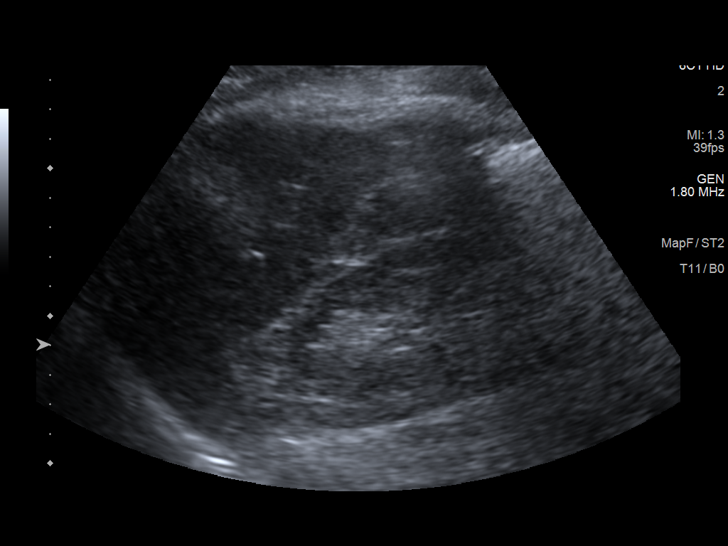
[im 66/79]
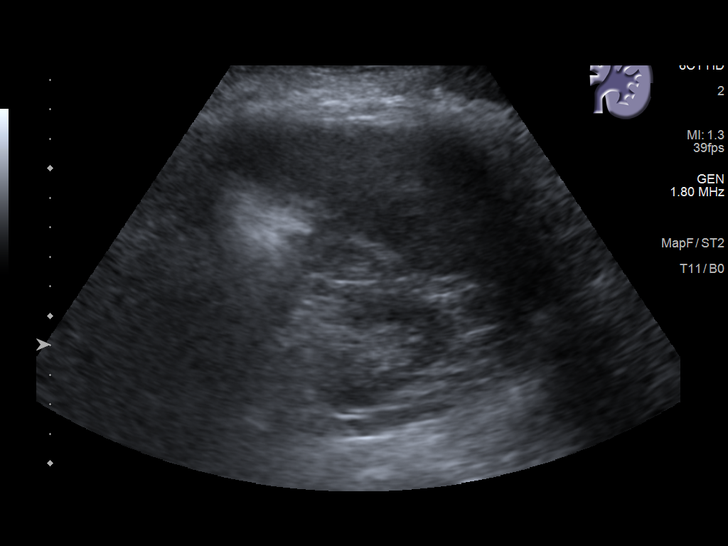
[im 72/79]
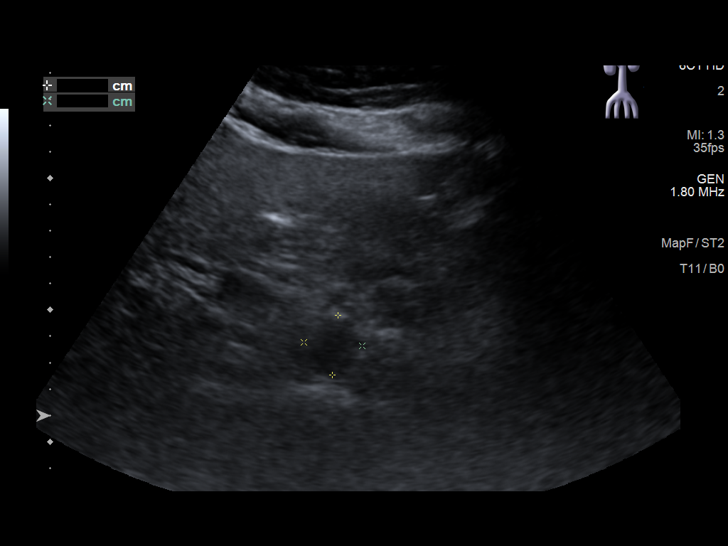
[im 79/79]
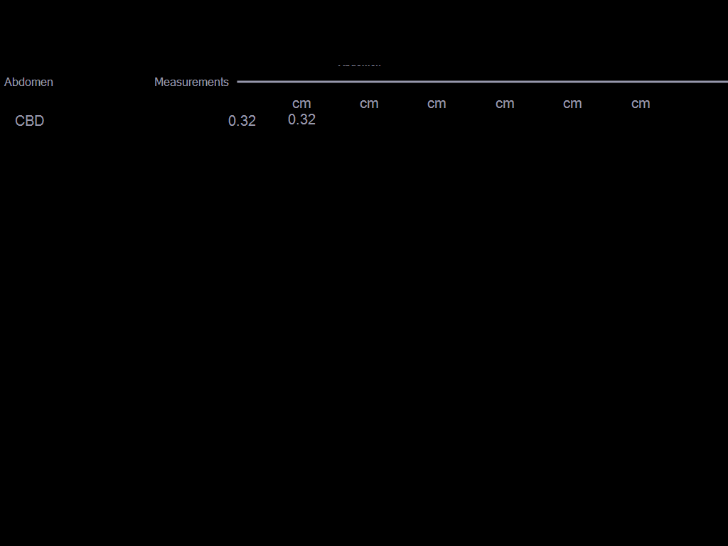

[13 of 25 positions shown; findings below may reference images not displayed]

FINDINGS: Gallbladder: Within the gallbladder, there is a 4 x 4 mm echogenic
focus which neither moves nor shadows, an apparent gallbladder
polyp. This apparent polyp is stable compared to previous study.
There are no echogenic foci in the gallbladder which move and shadow
as is expected with cholelithiasis. No gallbladder wall thickening
or pericholecystic fluid. No sonographic Murphy sign noted by
sonographer.

Common bile duct: Diameter: 3 mm. No intrahepatic, common hepatic,
or common bile dilatation.

Liver: No focal lesion identified. Liver echogenicity is increased
diffusely. Portal vein is patent on color Doppler imaging with
normal direction of blood flow towards the liver.

IVC: No abnormality visualized in areas where inferior vena cava can
be interrogated. Portions of inferior vena cava obscured by gas.

Pancreas: Visualized portion unremarkable. Portions of pancreas
obscured by gas.

Spleen: Size and appearance within normal limits.

Right Kidney: Length: 11.8 cm. Echogenicity within normal limits. No
mass or hydronephrosis visualized.

Left Kidney: Length: 11.6 cm. Echogenicity within normal limits. No
mass or hydronephrosis visualized.

Abdominal aorta: No aneurysm visualized.

Other findings: No evident ascites.
IMPRESSION: 1. Stable 4 x 4 mm apparent gallbladder polyp. Per consensus
guidelines, a polyp of this size does not warrant additional imaging
surveillance. No evident gallstones, gallbladder wall thickening, or
pericholecystic fluid.

2. Increased liver echogenicity, a finding indicative of hepatic
steatosis. No focal liver lesions are evident. Note that the
sensitivity of ultrasound for detection of focal liver lesions is
diminished in this circumstance.

3. Portions of inferior vena cava and pancreas obscured by gas.
Visualized portions of the structures appear unremarkable.

## 2019-08-16 ENCOUNTER — Other Ambulatory Visit: Payer: Self-pay | Admitting: Family Medicine

## 2019-08-16 DIAGNOSIS — I10 Essential (primary) hypertension: Secondary | ICD-10-CM

## 2019-08-16 NOTE — Telephone Encounter (Signed)
Requested Prescriptions  Pending Prescriptions Disp Refills  . lisinopril (ZESTRIL) 10 MG tablet [Pharmacy Med Name: LISINOPRIL 10MG  TABLETS] 90 tablet 0    Sig: TAKE 1 TABLET(10 MG) BY MOUTH DAILY     Cardiovascular:  ACE Inhibitors Failed - 08/16/2019 10:22 AM      Failed - Cr in normal range and within 180 days    Creatinine, Ser  Date Value Ref Range Status  02/03/2019 0.67 0.57 - 1.00 mg/dL Final         Failed - K in normal range and within 180 days    Potassium  Date Value Ref Range Status  02/03/2019 4.4 3.5 - 5.2 mmol/L Final         Failed - Last BP in normal range    BP Readings from Last 1 Encounters:  04/27/19 (!) 150/78         Failed - Valid encounter within last 6 months    Recent Outpatient Visits          6 months ago Nocturia   [place], [place], FNP   11 months ago Dyslipidemia   [facility] Steele Sizer, MD   1 year ago Epigastric pain   [place], Astrid Divine, FNP   1 year ago Cough   [facility] Hubbard Hartshorn, FNP   1 year ago Cough   [facility] Hubbard Hartshorn, [place]      Future Appointments            In 1 month Delsa Grana, PA-C [facility] [place] - Patient is not pregnant

## 2019-09-16 ENCOUNTER — Encounter: Payer: Self-pay | Admitting: Gastroenterology

## 2019-09-30 NOTE — Progress Notes (Signed)
Patient: Tina Stephenson, Female    DOB: 1955/04/30, 64 y.o.   MRN: [id number] Delsa Grana, PA-C Visit Date: 10/01/2019  Today's Provider: Delsa Grana, PA-C   Chief Complaint  Patient presents with  . Annual Exam   Subjective:   Annual physical exam:  Tina Stephenson is a 64 y.o. female who presents today for complete physical exam:  Exercise/Activity:  None Diet:  Cut down soda, stopped pizza/breads, eats more salads, tries portion control Declines all immunizations today as reflected in HM  Wants to loose weight- has tried [employer] in the past, thinking about noom?  Has hx of migraines - HA's nearly every day, she has severe migraine episodes not managed with prescriptions, uses OTC meds for it, probably total of 8-10 in a year, not monthly  She had left eye loss of vision - a lightening bolt- associated with a severe migraine She has nausea, pressure/migraine, light sensitive when the severe migraines start  Hx of sciatic and b/l knee pain/OA - hasn't seen a specialists - limits her ability to exercise and affects mobility  Hypertension:  Currently managed on lisinopril 10 mg Pt reports good med compliance and denies any SE.   Blood pressure today is well controlled. BP Readings from Last 3 Encounters:  10/01/19 138/76  04/27/19 (!) 150/78  04/08/19 (!) 155/77   Pt denies CP, SOB, exertional sx, LE edema, palpitation, Ha's, visual disturbances, lightheadedness, hypotension, syncope. Dietary efforts for BP?  Doesn't add salt to anything  HLD - not on meds  Asthma?  Requests refills on albuterol vials     USPSTF grade A and B recommendations - reviewed and addressed today  Depression:  Phq 9 completed today by patient, was reviewed by me with patient in the room PHQ score is neg, pt feels good PHQ 2/9 Scores 10/01/2019 01/24/2019 08/22/2018 06/25/2018  PHQ - 2 Score 1 1 0 0  PHQ- 9 Score - 3 0 0   Depression screen [facility] 2/9 10/01/2019 01/24/2019 08/22/2018 06/25/2018 03/25/2018   Decreased Interest 0 0 0 0 0  Down, Depressed, Hopeless 1 1 0 0 0  PHQ - 2 Score 1 1 0 0 0  Altered sleeping - 0 0 0 0  Tired, decreased energy - 1 0 0 0  Change in appetite - 0 0 0 0  Feeling bad or failure about yourself  - 1 0 0 0  Trouble concentrating - 0 0 0 0  Moving slowly or fidgety/restless - 0 0 0 0  Suicidal thoughts - 0 0 0 0  PHQ-9 Score - 3 0 0 0  Difficult doing work/chores - Not difficult at all - Not difficult at all Not difficult at all    Alcohol screening:   [facility] Visit from 10/01/2019 in [facility]  AUDIT-C Score 0      Immunizations and Health Maintenance: Health Maintenance  Topic Date Due  . HIV Screening  Never done  . MAMMOGRAM  03/09/2019  . COVID-19 Vaccine (1) 10/17/2019 (Originally 04/19/1967)  . INFLUENZA VACCINE  04/08/2020 (Originally 08/10/2019)  . PAP SMEAR-Modifier  08/21/2021  . COLONOSCOPY  12/07/2025  . TETANUS/TDAP  08/21/2028  . Hepatitis C Screening  Completed     Hep C Screening: 08/02/18  STD testing and prevention (HIV/chl/gon/syphilis):HIV ordered  see above, no additional testing desired by pt today  Intimate partner violence:  Safe at home, lives alone  Sexual History/Pain during Intercourse: Single  Menstrual History/LMP/Abnormal Bleeding:  Menopause around 10 years  ago No LMP recorded. Patient is postmenopausal.  Incontinence Symptoms: rare urge incontinence  Breast cancer: ordered  Last Mammogram: *see HM list above BRCA gene screening:  Paternal aunt breast CA No known genetic testing  Cervical cancer screening: 08/22/18 - neg PAP and HPV Pt denies family hx of ovarian, uterine, colon  Osteoporosis:   Discussion on osteoporosis per age, including high calcium and vitamin D supplementation, weight bearing exercises Pt is taking multivitamin - no additional with daily calcium/Vit D.  Skin cancer:  Hx of skin CA -  NO Discussed atypical lesions   Colorectal cancer:  12/08/2015 Colonoscopy  is UTD Discussed concerning signs and sx of CRC, pt denies melena, hematochezia  Lung cancer:   Low Dose CT Chest recommended if Age 26-80 years, 30 pack-year currently smoking OR have quit w/in 15years. Patient does not qualify.    Social History   Tobacco Use  . Smoking status: Never Smoker  . Smokeless tobacco: Never Used  Vaping Use  . Vaping Use: Never used  Substance Use Topics  . Alcohol use: Never  . Drug use: Never       Office Visit from 10/01/2019 in [facility]  AUDIT-C Score 0      Family History  Problem Relation Age of Onset  . COPD Mother   . Cancer Mother   . Heart disease Father   . Breast cancer Paternal Aunt   . Ovarian cancer Neg Hx   . Colon cancer Neg Hx      Blood pressure/Hypertension: BP Readings from Last 3 Encounters:  10/01/19 138/76  04/27/19 (!) 150/78  04/08/19 (!) 155/77    Weight/Obesity: Wt Readings from Last 3 Encounters:  10/01/19 267 lb 8 oz (121.3 kg)  04/27/19 262 lb (118.8 kg)  04/08/19 267 lb 3.2 oz (121.2 kg)   BMI Readings from Last 3 Encounters:  10/01/19 45.92 kg/m  04/27/19 44.97 kg/m  04/08/19 45.86 kg/m     Lipids:  Lab Results  Component Value Date   CHOL 209 (H) 06/25/2018   CHOL 198 02/21/2018   Lab Results  Component Value Date   HDL 54 06/25/2018   HDL 54 02/21/2018   Lab Results  Component Value Date   LDLCALC 132 (H) 06/25/2018   LDLCALC 118 (H) 02/21/2018   Lab Results  Component Value Date   TRIG 116 06/25/2018   TRIG 129 02/21/2018   Lab Results  Component Value Date   CHOLHDL 3.9 06/25/2018   CHOLHDL 3.7 02/21/2018   No results found for: LDLDIRECT Based on the results of lipid panel his/her cardiovascular risk factor ( using [place] )  in the next 10 years is: The 10-year ASCVD risk score Mikey Bussing DC Brooke Bonito., et al., 2013) is: 6.6%   Values used to calculate the score:     Age: 27 years     Sex: Female     Is Non-Hispanic African American: No      Diabetic: No     Tobacco smoker: No     Systolic Blood Pressure: 846 mmHg     Is BP treated: Yes     HDL Cholesterol: 54 mg/dL     Total Cholesterol: 209 mg/dL Glucose:  Glucose  Date Value Ref Range Status  02/03/2019 96 65 - 99 mg/dL Final  06/25/2018 87 65 - 99 mg/dL Final  02/21/2018 100 (H) 65 - 99 mg/dL Final   Hypertension: BP Readings from Last 3 Encounters:  10/01/19 138/76  04/27/19 Marland Kitchen)  150/78  04/08/19 (!) 155/77   Obesity: Wt Readings from Last 3 Encounters:  10/01/19 267 lb 8 oz (121.3 kg)  04/27/19 262 lb (118.8 kg)  04/08/19 267 lb 3.2 oz (121.2 kg)   BMI Readings from Last 3 Encounters:  10/01/19 45.92 kg/m  04/27/19 44.97 kg/m  04/08/19 45.86 kg/m      Advanced Care Planning:  A voluntary discussion about advance care planning including the explanation and discussion of advance directives.   Discussed health care proxy and Living will, and the patient was able to identify a health care proxy as her older brother Sindee Stucker.   Patient does not have a living will at present time.   Social History      She        Social History   Socioeconomic History  . Marital status: Single    Spouse name: Not on file  . Number of children: 0  . Years of education: Not on file  . Highest education level: Not on file  Occupational History  . Not on file  Tobacco Use  . Smoking status: Never Smoker  . Smokeless tobacco: Never Used  Vaping Use  . Vaping Use: Never used  Substance and Sexual Activity  . Alcohol use: Never  . Drug use: Never  . Sexual activity: Not Currently    Birth control/protection: None  Other Topics Concern  . Not on file  Social History Narrative  . Not on file   Social Determinants of Health   Financial Resource Strain: Low Risk   . Difficulty of Paying Living Expenses: Not hard at all  Food Insecurity: Food Insecurity Present  . Worried About Charity fundraiser in the Last Year: Sometimes true  . Ran Out of Food in the  Last Year: Sometimes true  Transportation Needs: No Transportation Needs  . Lack of Transportation (Medical): No  . Lack of Transportation (Non-Medical): No  Physical Activity: Inactive  . Days of Exercise per Week: 0 days  . Minutes of Exercise per Session: 0 min  Stress: Stress Concern Present  . Feeling of Stress : Very much  Social Connections: Moderately Integrated  . Frequency of Communication with Friends and Family: More than three times a week  . Frequency of Social Gatherings with Friends and Family: Twice a week  . Attends Religious Services: 1 to 4 times per year  . Active Member of Clubs or Organizations: Yes  . Attends Archivist Meetings: More than 4 times per year  . Marital Status: Never married    Family History        Family History  Problem Relation Age of Onset  . COPD Mother   . Cancer Mother   . Heart disease Father   . Breast cancer Paternal Aunt   . Ovarian cancer Neg Hx   . Colon cancer Neg Hx     Patient Active Problem List   Diagnosis Date Noted  . Cervical polyp 08/22/2018  . Nocturia 08/22/2018  . Intertrigo 08/22/2018  . Pre-diabetes 08/22/2018  . 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 History of migraine 03/25/2018  . Scoliosis 03/25/2018  . History of cyst of breast 03/25/2018  . Sacrum and coccyx fracture, sequela 02/21/2018  . Class 3 severe obesity due to excess calories without serious comorbidity with body mass index (BMI) of 45.0 to 49.9 in adult ([place]) 02/21/2018  . Cough 02/21/2018  . Onychomycosis 02/21/2018  .  History of hypertension 02/21/2018  . Vitamin D deficiency 03/19/2015    Past Surgical History:  Procedure Laterality Date  . APPENDECTOMY    . BREAST CYST EXCISION Left    x2  . BREAST CYST EXCISION Right    unable to see scar  . NASAL SEPTUM SURGERY    . WISDOM TOOTH EXTRACTION       Current Outpatient Medications:  .  albuterol (PROVENTIL)  (2.5 MG/3ML) 0.083% nebulizer solution, Take 3 mLs (2.5 mg total) by nebulization every 6 (six) hours as needed for wheezing or shortness of breath., Disp: 150 mL, Rfl: 0 .  Dexlansoprazole 30 MG capsule, Take 1 capsule (30 mg total) by mouth daily., Disp: 90 capsule, Rfl: 1 .  lisinopril (ZESTRIL) 10 MG tablet, TAKE 1 TABLET(10 MG) BY MOUTH DAILY, Disp: 90 tablet, Rfl: 0 .  cyclobenzaprine (FLEXERIL) 5 MG tablet, Take 1 tablet (5 mg total) by mouth 3 (three) times daily as needed., Disp: 9 tablet, Rfl: 0 .  lisinopril (ZESTRIL) 10 MG tablet, Take 1 tablet (10 mg total) by mouth daily., Disp: 90 tablet, Rfl: 3 .  SUMAtriptan (IMITREX) 25 MG tablet, Take 25-50 mg po at migraine onset, and may repeat dose in 2 hours as needed for unresolved HA, Maximum daily dose 200 mg in 24 hours, Disp: 20 tablet, Rfl: 3  Allergies  Allergen Reactions  . Penicillins Other (See Comments)    "lose my hearing"    Patient Care Team: Delsa Grana, PA-C as PCP - General (Family Medicine)  Review of Systems  Constitutional: Negative.  Negative for activity change, appetite change, fatigue and unexpected weight change.  HENT: Negative.   Eyes: Negative.   Respiratory: Negative.  Negative for shortness of breath.   Cardiovascular: Negative.  Negative for chest pain, palpitations and leg swelling.  Gastrointestinal: Negative.  Negative for abdominal pain and blood in stool.  Endocrine: Negative.   Genitourinary: Negative.   Musculoskeletal: Negative.  Negative for arthralgias, gait problem, joint swelling and myalgias.  Skin: Negative.  Negative for pallor and rash.  Allergic/Immunologic: Negative.   Neurological: Negative.  Negative for syncope and weakness.  Hematological: Negative.   Psychiatric/Behavioral: Negative.  Negative for dysphoric mood, self-injury and suicidal ideas. The patient is not nervous/anxious.   All other systems reviewed and are negative.    I personally reviewed active problem list,  medication list, allergies, family history, social history, health maintenance, notes from last encounter, lab results, imaging with the patient/caregiver today.       Objective:   Vitals:  Vitals:   10/01/19 0959  BP: 138/76  Pulse: 98  Resp: 16  Temp: 98.5 F (36.9 C)  TempSrc: Oral  SpO2: 97%  Weight: 267 lb 8 oz (121.3 kg)  Height: 5' 4"  (1.626 m)    Body mass index is 45.92 kg/m.  Physical Exam Vitals and nursing note reviewed.  Constitutional:      General: She is not in acute distress.    Appearance: Normal appearance. She is well-developed. She is morbidly obese. She is not ill-appearing, toxic-appearing or diaphoretic.     Interventions: Face mask in place.  HENT:     Head: Normocephalic and atraumatic.     Right Ear: Tympanic membrane, ear canal and external ear normal. There is no impacted cerumen.     Left Ear: Tympanic membrane, ear canal and external ear normal. There is no impacted cerumen.     Nose: Nose normal. No congestion or rhinorrhea.  Mouth/Throat:     Mouth: Mucous membranes are dry.     Pharynx: No oropharyngeal exudate or posterior oropharyngeal erythema.  Eyes:     General: Lids are normal. No scleral icterus.       Right eye: No discharge.        Left eye: No discharge.     Conjunctiva/sclera: Conjunctivae normal.     Pupils: Pupils are equal, round, and reactive to light.  Neck:     Thyroid: No thyroid mass, thyromegaly or thyroid tenderness.     Trachea: Trachea and phonation normal. No tracheal deviation.  Cardiovascular:     Rate and Rhythm: Normal rate and regular rhythm.     Pulses: Normal pulses.          Radial pulses are 2+ on the right side and 2+ on the left side.       Posterior tibial pulses are 2+ on the right side and 2+ on the left side.     Heart sounds: Normal heart sounds. No murmur heard.  No friction rub. No gallop.   Pulmonary:     Effort: Pulmonary effort is normal. No respiratory distress.     Breath sounds:  Normal breath sounds. No stridor. No wheezing, rhonchi or rales.  Chest:     Chest wall: No tenderness.  Abdominal:     General: Bowel sounds are normal. There is no distension.     Palpations: Abdomen is soft.  Musculoskeletal:     Cervical back: Normal range of motion and neck supple.     Right lower leg: No edema.     Left lower leg: No edema.  Skin:    General: Skin is warm and dry.     Coloration: Skin is not jaundiced or pale.     Findings: No rash.  Neurological:     Mental Status: She is alert. Mental status is at baseline.     Motor: No abnormal muscle tone.     Gait: Gait abnormal.  Psychiatric:        Mood and Affect: Mood normal.        Speech: Speech normal.        Behavior: Behavior normal. Behavior is cooperative.       Fall Risk: Fall Risk  10/01/2019 01/24/2019 08/22/2018 06/25/2018 03/25/2018  Falls in the past year? 1 0 0 0 0  Number falls in past yr: 0 0 0 0 0  Injury with Fall? 1 0 0 0 0  Follow up Falls evaluation completed - - - Falls evaluation completed    Functional Status Survey: Is the patient deaf or have difficulty hearing?: No Does the patient have difficulty seeing, even when wearing glasses/contacts?: No Does the patient have difficulty concentrating, remembering, or making decisions?: Yes Does the patient have difficulty walking or climbing stairs?: Yes Does the patient have difficulty dressing or bathing?: No Does the patient have difficulty doing errands alone such as visiting a doctor's office or shopping?: No   Assessment & Plan:    CPE completed today  . USPSTF grade A and B recommendations reviewed with patient; age-appropriate recommendations, preventive care, screening tests, etc discussed and encouraged; healthy living encouraged; see AVS for patient education given to patient  . Discussed importance of 150 minutes of physical activity weekly, AHA exercise recommendations given to pt in AVS/handout  . Discussed importance of  healthy diet:  eating lean meats and proteins, avoiding trans fats and saturated fats, avoid simple sugars and excessive  carbs in diet, eat 6 servings of fruit/vegetables daily and drink plenty of water and avoid sweet beverages.    . Recommended pt to do annual eye exam and routine dental exams/cleanings  . Depression, alcohol, fall screening completed as documented above and per flowsheets  . Reviewed Health Maintenance: Health Maintenance  Topic Date Due  . HIV Screening  Never done  . MAMMOGRAM  03/09/2019  . COVID-19 Vaccine (1) 10/17/2019 (Originally 04/19/1967)  . INFLUENZA VACCINE  04/08/2020 (Originally 08/10/2019)  . PAP SMEAR-Modifier  08/21/2021  . COLONOSCOPY  12/07/2025  . TETANUS/TDAP  08/21/2028  . Hepatitis C Screening  Completed    . Immunizations: Immunization History  Administered Date(s) Administered  . Hep A / Hep B 01/07/2019, 02/03/2019, 07/04/2019  . Influenza,inj,quad, With Preservative 10/08/2013, 12/02/2014  . Tdap 08/22/2018     ICD-10-CM   1. Annual physical exam  Z00.00 Lipid panel    CBC w/Diff/Platelet    HIV antibody (with reflex)    Comprehensive metabolic panel    Hemoglobin A1c  2. Screening for HIV without presence of risk factors  Z11.4 HIV antibody (with reflex)  3. Encounter for screening mammogram for malignant neoplasm of breast  Z12.31 MM 3D SCREEN BREAST BILATERAL  4. Essential hypertension  I10    stable, well controlled, due for labs, meds refilled, continue working on DASH and lifestyle efforts  5. Class 3 severe obesity due to excess calories without serious comorbidity with body mass index (BMI) of 45.0 to 49.9 in adult [facility])  E66.01 Lipid panel   Z68.42 Comprehensive metabolic panel    Hemoglobin A1c   offered referral to dietician or medical weight management  6. Dyslipidemia  E78.5 Lipid panel    Comprehensive metabolic panel   not on meds, work on diet  7. Reactive airway disease without complication, unspecified asthma  severity, unspecified whether persistent  J45.909 albuterol (PROVENTIL) (2.5 MG/3ML) 0.083% nebulizer solution   RAD? cough variant mild asthma?  pt hx is unclear but she asks for refills on albuterol vials, lungs clear today  8. Migraine without status migrainosus, not intractable, unspecified migraine type  G43.909    Ha/migraine hx?  recently worsening with ocular migraine 12/2018 - requesting records, trial of imitrex   Discussed and offered info about COVID vaccine etc, she declines today  She will need to f/up on imitrex dosing and migraines  Needs 6 month routine f/up  Pt was to let me know if she would like dietician referral or medical weight management referral once she checks with insurance.     Delsa Grana, PA-C 10/01/19 11:01 AM  [facility]

## 2019-10-01 ENCOUNTER — Other Ambulatory Visit: Payer: Self-pay

## 2019-10-01 ENCOUNTER — Ambulatory Visit (INDEPENDENT_AMBULATORY_CARE_PROVIDER_SITE_OTHER): Payer: Managed Care, Other (non HMO) | Admitting: Family Medicine

## 2019-10-01 ENCOUNTER — Encounter: Payer: Self-pay | Admitting: Family Medicine

## 2019-10-01 VITALS — BP 138/76 | HR 98 | Temp 98.5°F | Resp 16 | Ht 64.0 in | Wt 267.5 lb

## 2019-10-01 DIAGNOSIS — I10 Essential (primary) hypertension: Secondary | ICD-10-CM | POA: Diagnosis not present

## 2019-10-01 DIAGNOSIS — Z1231 Encounter for screening mammogram for malignant neoplasm of breast: Secondary | ICD-10-CM | POA: Diagnosis not present

## 2019-10-01 DIAGNOSIS — Z0000 Encounter for general adult medical examination without abnormal findings: Secondary | ICD-10-CM | POA: Diagnosis not present

## 2019-10-01 DIAGNOSIS — E785 Hyperlipidemia, unspecified: Secondary | ICD-10-CM

## 2019-10-01 DIAGNOSIS — Z114 Encounter for screening for human immunodeficiency virus [HIV]: Secondary | ICD-10-CM

## 2019-10-01 DIAGNOSIS — G43909 Migraine, unspecified, not intractable, without status migrainosus: Secondary | ICD-10-CM

## 2019-10-01 DIAGNOSIS — J45909 Unspecified asthma, uncomplicated: Secondary | ICD-10-CM

## 2019-10-01 DIAGNOSIS — Z6841 Body Mass Index (BMI) 40.0 and over, adult: Secondary | ICD-10-CM

## 2019-10-01 MED ORDER — SUMATRIPTAN SUCCINATE 25 MG PO TABS: ORAL_TABLET | ORAL | 3 refills | Status: DC

## 2019-10-01 MED ORDER — LISINOPRIL 10 MG PO TABS: 10.0000 mg | ORAL_TABLET | Freq: Every day | ORAL | 3 refills | Status: DC

## 2019-10-01 MED ORDER — ALBUTEROL SULFATE (2.5 MG/3ML) 0.083% IN NEBU: 2.5000 mg | INHALATION_SOLUTION | Freq: Four times a day (QID) | RESPIRATORY_TRACT | 0 refills | Status: DC | PRN

## 2019-10-01 NOTE — Patient Instructions (Addendum)
Imitrex.  Can use 25-100 mg by mouth once at the onset of headache, and if it doesn't resolve can take a second dose one hour later.  Max dose in 24 hours is 200 mg  Medical weight management referral - Dr. Dennard Nip- Check with your insurance if she is in network and covered as a specialist visit  We can always do a nutritional referral  Osteoporosis prevention 1200 mg calcium daily and 1000 IU Vit D 3 daily    Migraine Headache A migraine headache is a very strong throbbing pain on one side or both sides of your head. This type of headache can also cause other symptoms. It can last from 4 hours to 3 days. Talk with your doctor about what things may bring on (trigger) this condition. What are the causes? The exact cause of this condition is not known. This condition may be triggered or caused by:  Drinking alcohol.  Smoking.  Taking medicines, such as: ? Medicine used to treat chest pain (nitroglycerin). ? Birth control pills. ? Estrogen. ? Some blood pressure medicines.  Eating or drinking certain products.  Doing physical activity. Other things that may trigger a migraine headache include:  Having a menstrual period.  Pregnancy.  Hunger.  Stress.  Not getting enough sleep or getting too much sleep.  Weather changes.  Tiredness (fatigue). What increases the risk?  Being 36-18 years old.  Being female.  Having a family history of migraine headaches.  Being Caucasian.  Having depression or anxiety.  Being very overweight. What are the signs or symptoms?  A throbbing pain. This pain may: ? Happen in any area of the head, such as on one side or both sides. ? Make it hard to do daily activities. ? Get worse with physical activity. ? Get worse around bright lights or loud noises.  Other symptoms may include: ? Feeling sick to your stomach (nauseous). ? Vomiting. ? Dizziness. ? Being sensitive to bright lights, loud noises, or smells.  Before you  get a migraine headache, you may get warning signs (an aura). An aura may include: ? Seeing flashing lights or having blind spots. ? Seeing bright spots, halos, or zigzag lines. ? Having tunnel vision or blurred vision. ? Having numbness or a tingling feeling. ? Having trouble talking. ? Having weak muscles.  Some people have symptoms after a migraine headache (postdromal phase), such as: ? Tiredness. ? Trouble thinking (concentrating). How is this treated?  Taking medicines that: ? Relieve pain. ? Relieve the feeling of being sick to your stomach. ? Prevent migraine headaches.  Treatment may also include: ? Having acupuncture. ? Avoiding foods that bring on migraine headaches. ? Learning ways to control your body functions (biofeedback). ? Therapy to help you know and deal with negative thoughts (cognitive behavioral therapy). Follow these instructions at home: Medicines  Take over-the-counter and prescription medicines only as told by your doctor.  Ask your doctor if the medicine prescribed to you: ? Requires you to avoid driving or using heavy machinery. ? Can cause trouble pooping (constipation). You may need to take these steps to prevent or treat trouble pooping:  Drink enough fluid to keep your pee (urine) pale yellow.  Take over-the-counter or prescription medicines.  Eat foods that are high in fiber. These include beans, whole grains, and fresh fruits and vegetables.  Limit foods that are high in fat and sugar. These include fried or sweet foods. Lifestyle  Do not drink alcohol.  Do not use  any products that contain nicotine or tobacco, such as cigarettes, e-cigarettes, and chewing tobacco. If you need help quitting, ask your doctor.  Get at least 8 hours of sleep every night.  Limit and deal with stress. General instructions      Keep a journal to find out what may bring on your migraine headaches. For example, write down: ? What you eat and  drink. ? How much sleep you get. ? Any change in what you eat or drink. ? Any change in your medicines.  If you have a migraine headache: ? Avoid things that make your symptoms worse, such as bright lights. ? It may help to lie down in a dark, quiet room. ? Do not drive or use heavy machinery. ? Ask your doctor what activities are safe for you.  Keep all follow-up visits as told by your doctor. This is important. Contact a doctor if:  You get a migraine headache that is different or worse than others you have had.  You have more than 15 headache days in one month. Get help right away if:  Your migraine headache gets very bad.  Your migraine headache lasts longer than 72 hours.  You have a fever.  You have a stiff neck.  You have trouble seeing.  Your muscles feel weak or like you cannot control them.  You start to lose your balance a lot.  You start to have trouble walking.  You pass out (faint).  You have a seizure. Summary  A migraine headache is a very strong throbbing pain on one side or both sides of your head. These headaches can also cause other symptoms.  This condition may be treated with medicines and changes to your lifestyle.  Keep a journal to find out what may bring on your migraine headaches.  Contact a doctor if you get a migraine headache that is different or worse than others you have had.  Contact your doctor if you have more than 15 headache days in a month. This information is not intended to replace advice given to you by your health care provider. Make sure you discuss any questions you have with your health care provider. Document Revised: 04/19/2018 Document Reviewed: 02/07/2018 Elsevier Patient Education  [place] Headache Without Cause A headache is pain or discomfort that is felt around the head or neck area. There are many causes and types of headaches. In some cases, the cause may not be found. Follow these  instructions at home: Watch your condition for any changes. Let your doctor know about them. Take these steps to help with your condition: Managing pain      Take over-the-counter and prescription medicines only as told by your doctor.  Lie down in a dark, quiet room when you have a headache.  If told, put ice on your head and neck area: ? Put ice in a plastic bag. ? Place a towel between your skin and the bag. ? Leave the ice on for 20 minutes, 2-3 times per day.  If told, put heat on the affected area. Use the heat source that your doctor recommends, such as a moist heat pack or a heating pad. ? Place a towel between your skin and the heat source. ? Leave the heat on for 20-30 minutes. ? Remove the heat if your skin turns bright red. This is very important if you are unable to feel pain, heat, or cold. You may have a greater risk  of getting burned.  Keep lights dim if bright lights bother you or make your headaches worse. Eating and drinking  Eat meals on a regular schedule.  If you drink alcohol: ? Limit how much you use to:  0-1 drink a day for women.  0-2 drinks a day for men. ? Be aware of how much alcohol is in your drink. In the [place] one drink equals one 12 oz bottle of beer (355 mL), one 5 oz glass of wine (148 mL), or one 1 oz glass of hard liquor (44 mL).  Stop drinking caffeine, or reduce how much caffeine you drink. General instructions   Keep a journal to find out if certain things bring on headaches. For example, write down: ? What you eat and drink. ? How much sleep you get. ? Any change to your diet or medicines.  Get a massage or try other ways to relax.  Limit stress.  Sit up straight. Do not tighten (tense) your muscles.  Do not use any products that contain nicotine or tobacco. This includes cigarettes, e-cigarettes, and chewing tobacco. If you need help quitting, ask your doctor.  Exercise regularly as told by your doctor.  Get enough  sleep. This often means 7-9 hours of sleep each night.  Keep all follow-up visits as told by your doctor. This is important. Contact a doctor if:  Your symptoms are not helped by medicine.  You have a headache that feels different than the other headaches.  You feel sick to your stomach (nauseous) or you throw up (vomit).  You have a fever. Get help right away if:  Your headache gets very bad quickly.  Your headache gets worse after a lot of physical activity.  You keep throwing up.  You have a stiff neck.  You have trouble seeing.  You have trouble speaking.  You have pain in the eye or ear.  Your muscles are weak or you lose muscle control.  You lose your balance or have trouble walking.  You feel like you will pass out (faint) or you pass out.  You are mixed up (confused).  You have a seizure. Summary  A headache is pain or discomfort that is felt around the head or neck area.  There are many causes and types of headaches. In some cases, the cause may not be found.  Keep a journal to help find out what causes your headaches. Watch your condition for any changes. Let your doctor know about them.  Contact a doctor if you have a headache that is different from usual, or if your headache is not helped by medicine.  Get help right away if your headache gets very bad, you throw up, you have trouble seeing, you lose your balance, or you have a seizure. This information is not intended to replace advice given to you by your health care provider. Make sure you discuss any questions you have with your health care provider. Document Revised: 07/16/2017 Document Reviewed: 07/16/2017 Elsevier Patient Education  [place] DASH stands for "Dietary Approaches to Stop Hypertension." The DASH eating plan is a healthy eating plan that has been shown to reduce high blood pressure (hypertension). It may also reduce your risk for type 2 diabetes, heart  disease, and stroke. The DASH eating plan may also help with weight loss. What are tips for following this plan?  General guidelines  Avoid eating more than 2,300 mg (milligrams) of salt (sodium) a day. If  you have hypertension, you may need to reduce your sodium intake to 1,500 mg a day.  Limit alcohol intake to no more than 1 drink a day for nonpregnant women and 2 drinks a day for men. One drink equals 12 oz of beer, 5 oz of wine, or 1 oz of hard liquor.  Work with your health care provider to maintain a healthy body weight or to lose weight. Ask what an ideal weight is for you.  Get at least 30 minutes of exercise that causes your heart to beat faster (aerobic exercise) most days of the week. Activities may include walking, swimming, or biking.  Work with your health care provider or diet and nutrition specialist (dietitian) to adjust your eating plan to your individual calorie needs. Reading food labels   Check food labels for the amount of sodium per serving. Choose foods with less than 5 percent of the Daily Value of sodium. Generally, foods with less than 300 mg of sodium per serving fit into this eating plan.  To find whole grains, look for the word "whole" as the first word in the ingredient list. Shopping  Buy products labeled as "low-sodium" or "no salt added."  Buy fresh foods. Avoid canned foods and premade or frozen meals. Cooking  Avoid adding salt when cooking. Use salt-free seasonings or herbs instead of table salt or sea salt. Check with your health care provider or pharmacist before using salt substitutes.  Do not fry foods. Cook foods using healthy methods such as baking, boiling, grilling, and broiling instead.  Cook with heart-healthy oils, such as olive, canola, soybean, or sunflower oil. Meal planning  Eat a balanced diet that includes: ? 5 or more servings of fruits and vegetables each day. At each meal, try to fill half of your plate with fruits and  vegetables. ? Up to 6-8 servings of whole grains each day. ? Less than 6 oz of lean meat, poultry, or fish each day. A 3-oz serving of meat is about the same size as a deck of cards. One egg equals 1 oz. ? 2 servings of low-fat dairy each day. ? A serving of nuts, seeds, or beans 5 times each week. ? Heart-healthy fats. Healthy fats called Omega-3 fatty acids are found in foods such as flaxseeds and coldwater fish, like sardines, salmon, and mackerel.  Limit how much you eat of the following: ? Canned or prepackaged foods. ? Food that is high in trans fat, such as fried foods. ? Food that is high in saturated fat, such as fatty meat. ? Sweets, desserts, sugary drinks, and other foods with added sugar. ? Full-fat dairy products.  Do not salt foods before eating.  Try to eat at least 2 vegetarian meals each week.  Eat more home-cooked food and less restaurant, buffet, and fast food.  When eating at a restaurant, ask that your food be prepared with less salt or no salt, if possible. What foods are recommended? The items listed may not be a complete list. Talk with your dietitian about what dietary choices are best for you. Grains Whole-grain or whole-wheat bread. Whole-grain or whole-wheat pasta. Brown rice. [place]. Bulgur. Whole-grain and low-sodium cereals. Pita bread. Low-fat, low-sodium crackers. Whole-wheat flour tortillas. Vegetables Fresh or frozen vegetables (raw, steamed, roasted, or grilled). Low-sodium or reduced-sodium tomato and vegetable juice. Low-sodium or reduced-sodium tomato sauce and tomato paste. Low-sodium or reduced-sodium canned vegetables. Fruits All fresh, dried, or frozen fruit. Canned fruit in natural juice (without added sugar).  Meat and other protein foods Skinless chicken or [place]. Ground chicken or [place]. Pork with fat trimmed off. Fish and seafood. Egg whites. Dried beans, peas, or lentils. Unsalted nuts, nut butters, and seeds. Unsalted canned  beans. Lean cuts of beef with fat trimmed off. Low-sodium, lean deli meat. Dairy Low-fat (1%) or fat-free (skim) milk. Fat-free, low-fat, or reduced-fat cheeses. Nonfat, low-sodium ricotta or cottage cheese. Low-fat or nonfat yogurt. Low-fat, low-sodium cheese. Fats and oils Soft margarine without trans fats. Vegetable oil. Low-fat, reduced-fat, or light mayonnaise and salad dressings (reduced-sodium). Canola, safflower, olive, soybean, and sunflower oils. Avocado. Seasoning and other foods Herbs. Spices. Seasoning mixes without salt. Unsalted popcorn and pretzels. Fat-free sweets. What foods are not recommended? The items listed may not be a complete list. Talk with your dietitian about what dietary choices are best for you. Grains Baked goods made with fat, such as croissants, muffins, or some breads. Dry pasta or rice meal packs. Vegetables Creamed or fried vegetables. Vegetables in a cheese sauce. Regular canned vegetables (not low-sodium or reduced-sodium). Regular canned tomato sauce and paste (not low-sodium or reduced-sodium). Regular tomato and vegetable juice (not low-sodium or reduced-sodium). Angie Fava. Olives. Fruits Canned fruit in a light or heavy syrup. Fried fruit. Fruit in cream or butter sauce. Meat and other protein foods Fatty cuts of meat. Ribs. Fried meat. Berniece Salines. Sausage. Bologna and other processed lunch meats. Salami. Fatback. Hotdogs. Bratwurst. Salted nuts and seeds. Canned beans with added salt. Canned or smoked fish. Whole eggs or egg yolks. Chicken or [place] with skin. Dairy Whole or 2% milk, cream, and half-and-half. Whole or full-fat cream cheese. Whole-fat or sweetened yogurt. Full-fat cheese. Nondairy creamers. Whipped toppings. Processed cheese and cheese spreads. Fats and oils Butter. Stick margarine. Lard. Shortening. Ghee. Bacon fat. Tropical oils, such as coconut, palm kernel, or palm oil. Seasoning and other foods Salted popcorn and pretzels. Onion salt,  garlic salt, seasoned salt, table salt, and sea salt. Worcestershire sauce. Tartar sauce. Barbecue sauce. Teriyaki sauce. Soy sauce, including reduced-sodium. Steak sauce. Canned and packaged gravies. Fish sauce. Oyster sauce. Cocktail sauce. Horseradish that you find on the shelf. Ketchup. Mustard. Meat flavorings and tenderizers. Bouillon cubes. Hot sauce and Tabasco sauce. Premade or packaged marinades. Premade or packaged taco seasonings. Relishes. Regular salad dressings. Where to find more information:  National Heart, Lung, and [place]: https://wilson-eaton.com/  American Heart Association: www.heart.org Summary  The DASH eating plan is a healthy eating plan that has been shown to reduce high blood pressure (hypertension). It may also reduce your risk for type 2 diabetes, heart disease, and stroke.  With the DASH eating plan, you should limit salt (sodium) intake to 2,300 mg a day. If you have hypertension, you may need to reduce your sodium intake to 1,500 mg a day.  When on the DASH eating plan, aim to eat more fresh fruits and vegetables, whole grains, lean proteins, low-fat dairy, and heart-healthy fats.  Work with your health care provider or diet and nutrition specialist (dietitian) to adjust your eating plan to your individual calorie needs. This information is not intended to replace advice given to you by your health care provider. Make sure you discuss any questions you have with your health care provider. Document Revised: 12/08/2016 Document Reviewed: 12/20/2015 Elsevier Patient Education  2020 Elsevier Inc.   Preventive Care 76-71 Years Old, Female Preventive care refers to visits with your health care provider and lifestyle choices that can promote health and wellness. This includes:  A yearly physical  exam. This may also be called an annual well check.  Regular dental visits and eye exams.  Immunizations.  Screening for certain conditions.  Healthy lifestyle  choices, such as eating a healthy diet, getting regular exercise, not using drugs or products that contain nicotine and tobacco, and limiting alcohol use. What can I expect for my preventive care visit? Physical exam Your health care provider will check your:  Height and weight. This may be used to calculate body mass index (BMI), which tells if you are at a healthy weight.  Heart rate and blood pressure.  Skin for abnormal spots. Counseling Your health care provider may ask you questions about your:  Alcohol, tobacco, and drug use.  Emotional well-being.  Home and relationship well-being.  Sexual activity.  Eating habits.  Work and work Statistician.  Method of birth control.  Menstrual cycle.  Pregnancy history. What immunizations do I need?  Influenza (flu) vaccine  This is recommended every year. Tetanus, diphtheria, and pertussis (Tdap) vaccine  You may need a Td booster every 10 years. Varicella (chickenpox) vaccine  You may need this if you have not been vaccinated. Zoster (shingles) vaccine  You may need this after age 23. Measles, mumps, and rubella (MMR) vaccine  You may need at least one dose of MMR if you were born in 1957 or later. You may also need a second dose. Pneumococcal conjugate (PCV13) vaccine  You may need this if you have certain conditions and were not previously vaccinated. Pneumococcal polysaccharide (PPSV23) vaccine  You may need one or two doses if you smoke cigarettes or if you have certain conditions. Meningococcal conjugate (MenACWY) vaccine  You may need this if you have certain conditions. Hepatitis A vaccine  You may need this if you have certain conditions or if you travel or work in places where you may be exposed to hepatitis A. Hepatitis B vaccine  You may need this if you have certain conditions or if you travel or work in places where you may be exposed to hepatitis B. Haemophilus influenzae type b (Hib)  vaccine  You may need this if you have certain conditions. Human papillomavirus (HPV) vaccine  If recommended by your health care provider, you may need three doses over 6 months. You may receive vaccines as individual doses or as more than one vaccine together in one shot (combination vaccines). Talk with your health care provider about the risks and benefits of combination vaccines. What tests do I need? Blood tests  Lipid and cholesterol levels. These may be checked every 5 years, or more frequently if you are over 56 years old.  Hepatitis C test.  Hepatitis B test. Screening  Lung cancer screening. You may have this screening every year starting at age 66 if you have a 30-pack-year history of smoking and currently smoke or have quit within the past 15 years.  Colorectal cancer screening. All adults should have this screening starting at age 63 and continuing until age 33. Your health care provider may recommend screening at age 23 if you are at increased risk. You will have tests every 1-10 years, depending on your results and the type of screening test.  Diabetes screening. This is done by checking your blood sugar (glucose) after you have not eaten for a while (fasting). You may have this done every 1-3 years.  Mammogram. This may be done every 1-2 years. Talk with your health care provider about when you should start having regular mammograms. This may depend  on whether you have a family history of breast cancer.  BRCA-related cancer screening. This may be done if you have a family history of breast, ovarian, tubal, or peritoneal cancers.  Pelvic exam and Pap test. This may be done every 3 years starting at age 55. Starting at age 35, this may be done every 5 years if you have a Pap test in combination with an HPV test. Other tests  Sexually transmitted disease (STD) testing.  Bone density scan. This is done to screen for osteoporosis. You may have this scan if you are at high  risk for osteoporosis. Follow these instructions at home: Eating and drinking  Eat a diet that includes fresh fruits and vegetables, whole grains, lean protein, and low-fat dairy.  Take vitamin and mineral supplements as recommended by your health care provider.  Do not drink alcohol if: ? Your health care provider tells you not to drink. ? You are pregnant, may be pregnant, or are planning to become pregnant.  If you drink alcohol: ? Limit how much you have to 0-1 drink a day. ? Be aware of how much alcohol is in your drink. In the [place] one drink equals one 12 oz bottle of beer (355 mL), one 5 oz glass of wine (148 mL), or one 1 oz glass of hard liquor (44 mL). Lifestyle  Take daily care of your teeth and gums.  Stay active. Exercise for at least 30 minutes on 5 or more days each week.  Do not use any products that contain nicotine or tobacco, such as cigarettes, e-cigarettes, and chewing tobacco. If you need help quitting, ask your health care provider.  If you are sexually active, practice safe sex. Use a condom or other form of birth control (contraception) in order to prevent pregnancy and STIs (sexually transmitted infections).  If told by your health care provider, take low-dose aspirin daily starting at age 41. What's next?  Visit your health care provider once a year for a well check visit.  Ask your health care provider how often you should have your eyes and teeth checked.  Stay up to date on all vaccines. This information is not intended to replace advice given to you by your health care provider. Make sure you discuss any questions you have with your health care provider. Document Revised: 09/06/2017 Document Reviewed: 09/06/2017 Elsevier Patient Education  2020 [employer].

## 2019-10-02 ENCOUNTER — Encounter: Payer: Self-pay | Admitting: Family Medicine

## 2019-10-02 LAB — CBC WITH DIFFERENTIAL/PLATELET
Basophils Absolute: 0 10*3/uL (ref 0.0–0.2)
Basos: 1 %
EOS (ABSOLUTE): 0.1 10*3/uL (ref 0.0–0.4)
Eos: 1 %
Hematocrit: 39.8 % (ref 34.0–46.6)
Hemoglobin: 13.3 g/dL (ref 11.1–15.9)
Immature Grans (Abs): 0 10*3/uL (ref 0.0–0.1)
Immature Granulocytes: 0 %
Lymphocytes Absolute: 3 10*3/uL (ref 0.7–3.1)
Lymphs: 34 %
MCH: 29.6 pg (ref 26.6–33.0)
MCHC: 33.4 g/dL (ref 31.5–35.7)
MCV: 88 fL (ref 79–97)
Monocytes Absolute: 0.5 10*3/uL (ref 0.1–0.9)
Monocytes: 6 %
Neutrophils Absolute: 5.2 10*3/uL (ref 1.4–7.0)
Neutrophils: 58 %
Platelets: 364 10*3/uL (ref 150–450)
RBC: 4.5 x10E6/uL (ref 3.77–5.28)
RDW: 12.5 % (ref 11.7–15.4)
WBC: 8.8 10*3/uL (ref 3.4–10.8)

## 2019-10-02 LAB — HEMOGLOBIN A1C
Est. average glucose Bld gHb Est-mCnc: 151 mg/dL
Hgb A1c MFr Bld: 6.9 % — ABNORMAL HIGH (ref 4.8–5.6)

## 2019-10-02 LAB — LIPID PANEL
Chol/HDL Ratio: 4.7 ratio — ABNORMAL HIGH (ref 0.0–4.4)
Cholesterol, Total: 223 mg/dL — ABNORMAL HIGH (ref 100–199)
HDL: 47 mg/dL (ref >39)
LDL Chol Calc (NIH): 130 mg/dL — ABNORMAL HIGH (ref 0–99)
Triglycerides: 256 mg/dL — ABNORMAL HIGH (ref 0–149)
VLDL Cholesterol Cal: 46 mg/dL — ABNORMAL HIGH (ref 5–40)

## 2019-10-02 LAB — COMPREHENSIVE METABOLIC PANEL
ALT: 33 IU/L — ABNORMAL HIGH (ref 0–32)
AST: 25 IU/L (ref 0–40)
Albumin/Globulin Ratio: 2 (ref 1.2–2.2)
Albumin: 4.9 g/dL — ABNORMAL HIGH (ref 3.8–4.8)
Alkaline Phosphatase: 131 IU/L — ABNORMAL HIGH (ref 44–121)
BUN/Creatinine Ratio: 21 (ref 12–28)
BUN: 15 mg/dL (ref 8–27)
Bilirubin Total: 1.2 mg/dL (ref 0.0–1.2)
CO2: 26 mmol/L (ref 20–29)
Calcium: 10.1 mg/dL (ref 8.7–10.3)
Chloride: 103 mmol/L (ref 96–106)
Creatinine, Ser: 0.73 mg/dL (ref 0.57–1.00)
GFR calc Af Amer: 102 mL/min/{1.73_m2} (ref >59)
GFR calc non Af Amer: 89 mL/min/{1.73_m2} (ref >59)
Globulin, Total: 2.4 g/dL (ref 1.5–4.5)
Glucose: 124 mg/dL — ABNORMAL HIGH (ref 65–99)
Potassium: 5 mmol/L (ref 3.5–5.2)
Sodium: 142 mmol/L (ref 134–144)
Total Protein: 7.3 g/dL (ref 6.0–8.5)

## 2019-10-02 LAB — HIV ANTIBODY (ROUTINE TESTING W REFLEX): HIV Screen 4th Generation wRfx: NONREACTIVE

## 2019-10-06 ENCOUNTER — Other Ambulatory Visit: Payer: Self-pay | Admitting: Gastroenterology

## 2019-10-12 ENCOUNTER — Encounter: Payer: Self-pay | Admitting: Family Medicine

## 2019-10-13 ENCOUNTER — Encounter: Payer: Self-pay | Admitting: Family Medicine

## 2019-11-06 ENCOUNTER — Encounter: Payer: Self-pay | Admitting: Family Medicine

## 2019-11-06 ENCOUNTER — Other Ambulatory Visit: Payer: Self-pay

## 2019-11-06 ENCOUNTER — Ambulatory Visit: Payer: Managed Care, Other (non HMO) | Admitting: Family Medicine

## 2019-11-06 ENCOUNTER — Ambulatory Visit
Admission: RE | Admit: 2019-11-06 | Discharge: 2019-11-06 | Disposition: A | Discharge status: Home / Self Care | Payer: Managed Care, Other (non HMO) | Source: Ambulatory Visit | Attending: Family Medicine | Admitting: Family Medicine

## 2019-11-06 VITALS — BP 134/68 | HR 96 | Temp 98.0°F | Resp 16 | Ht 64.0 in | Wt 268.5 lb

## 2019-11-06 DIAGNOSIS — Z1231 Encounter for screening mammogram for malignant neoplasm of breast: Secondary | ICD-10-CM | POA: Diagnosis not present

## 2019-11-06 DIAGNOSIS — Z6841 Body Mass Index (BMI) 40.0 and over, adult: Secondary | ICD-10-CM | POA: Diagnosis not present

## 2019-11-06 DIAGNOSIS — E119 Type 2 diabetes mellitus without complications: Secondary | ICD-10-CM

## 2019-11-06 IMAGING — MG DIGITAL SCREENING BILAT W/ TOMO W/ CAD
8 series · 8 of 24 positions shown · non-contrast
Comparison: Previous exam(s).

CLINICAL DATA: Screening.

EXAM:
DIGITAL SCREENING BILATERAL MAMMOGRAM WITH TOMO AND CAD

[L MLO synth-2D]
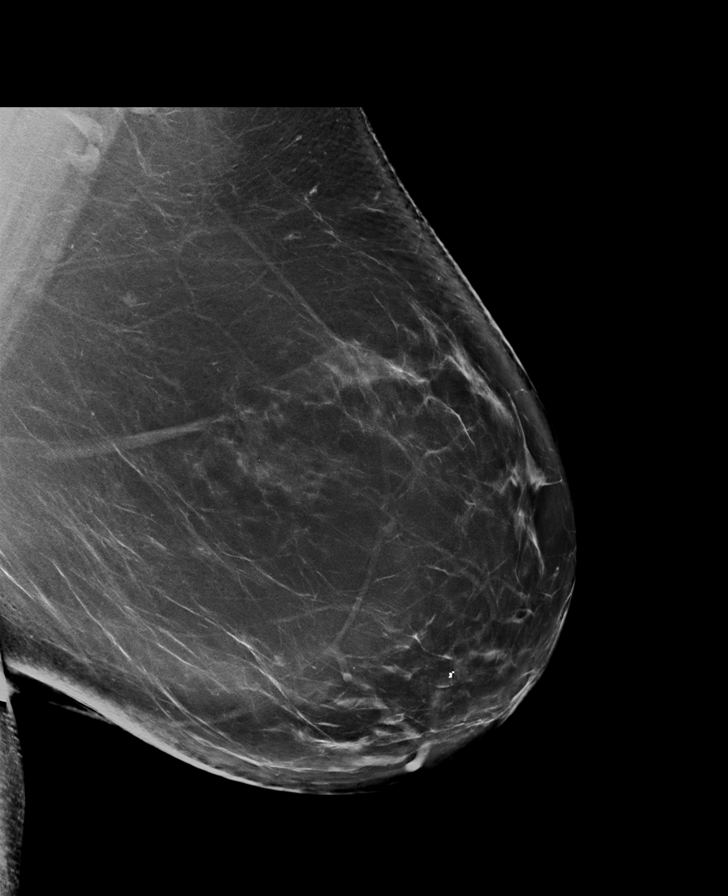

[R CC synth-2D]
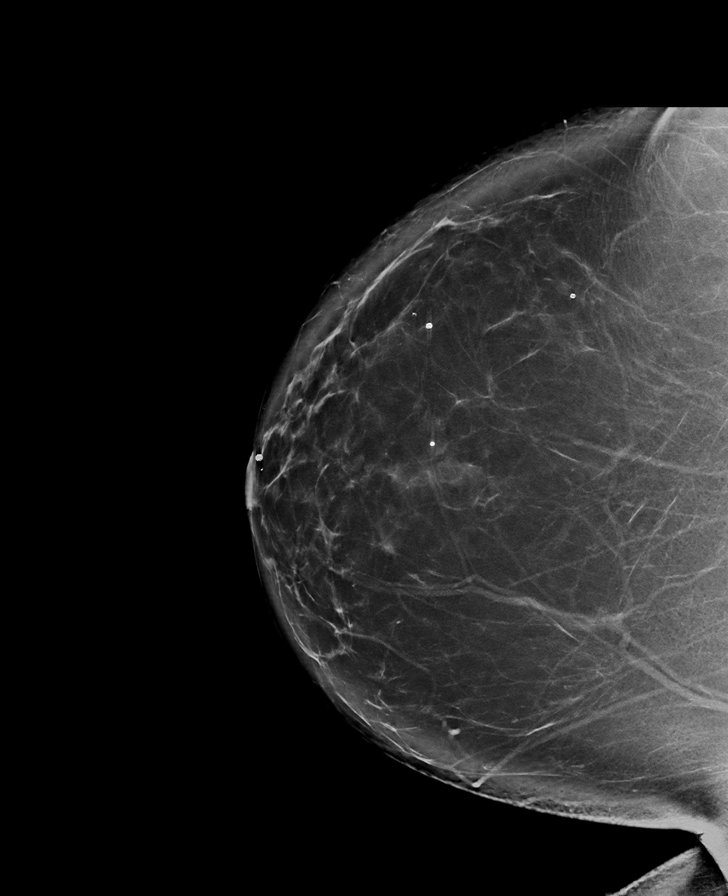

[L CC synth-2D]
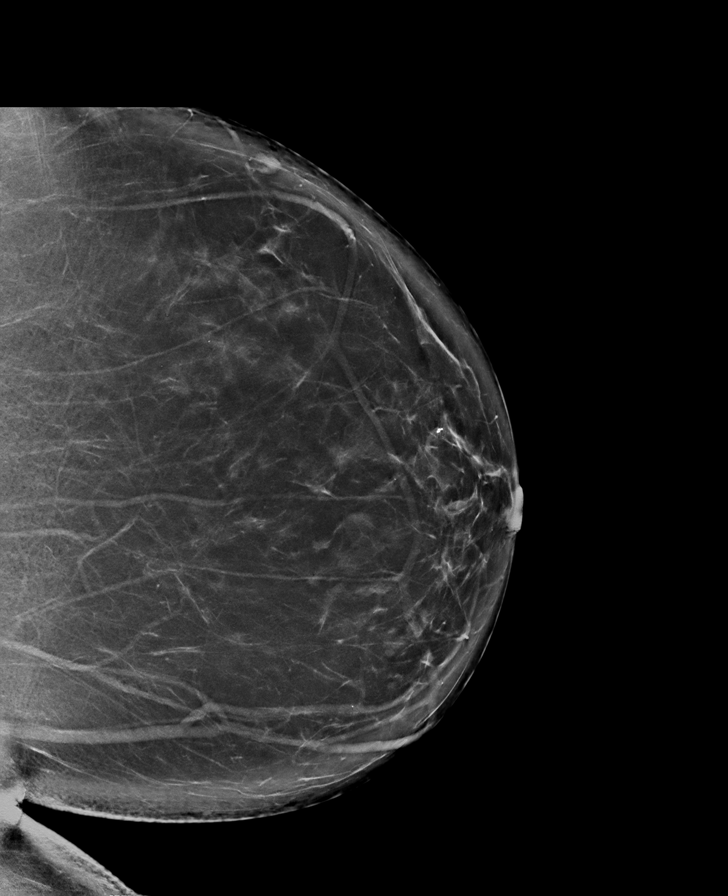

[R MLO synth-2D]
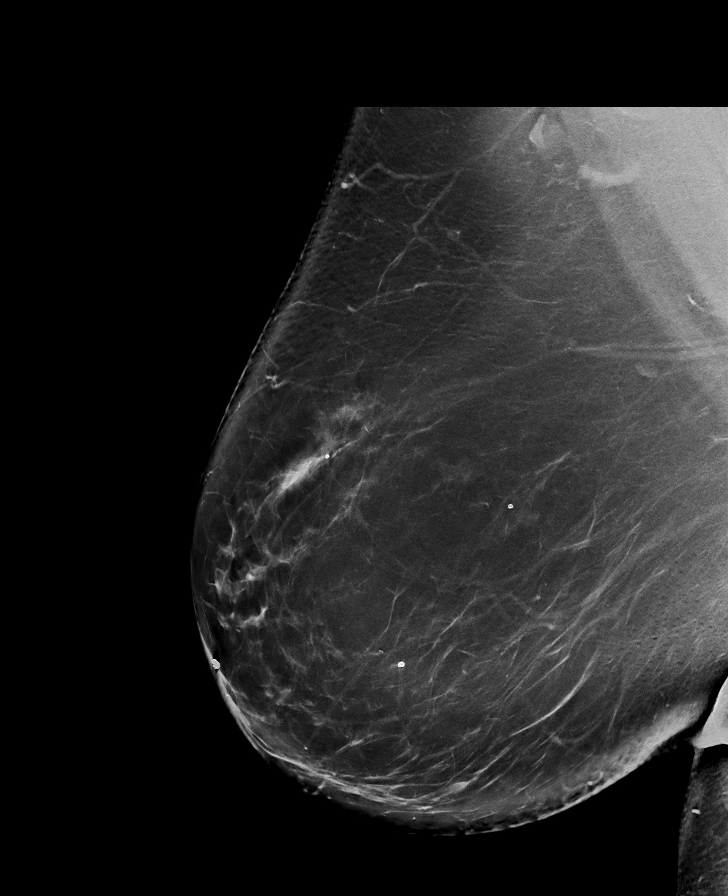

[R CC tomo · tomo slice 52/103.0]
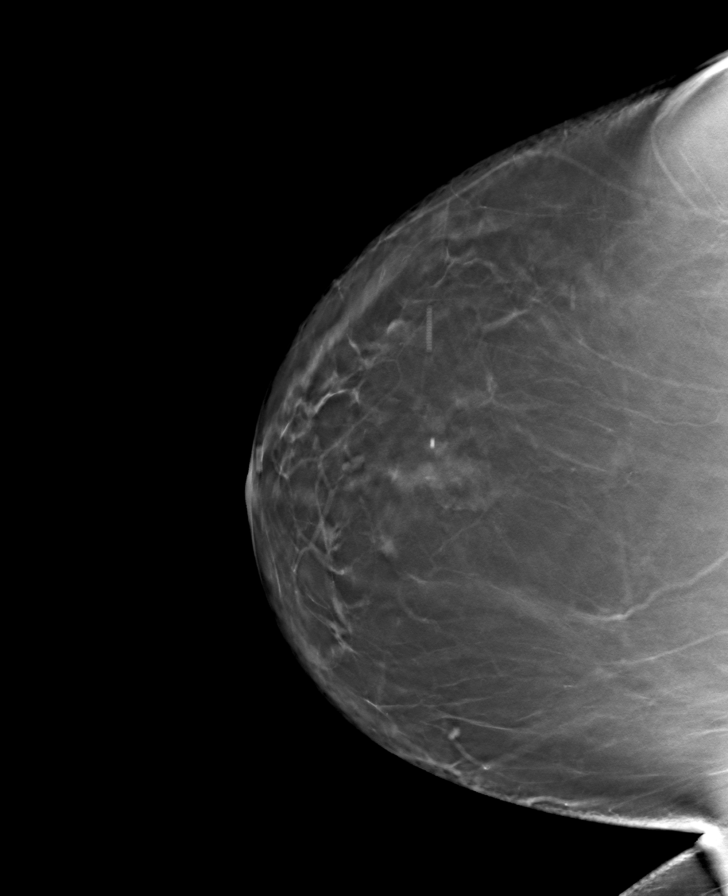

[L CC tomo · tomo slice 49/97.0]
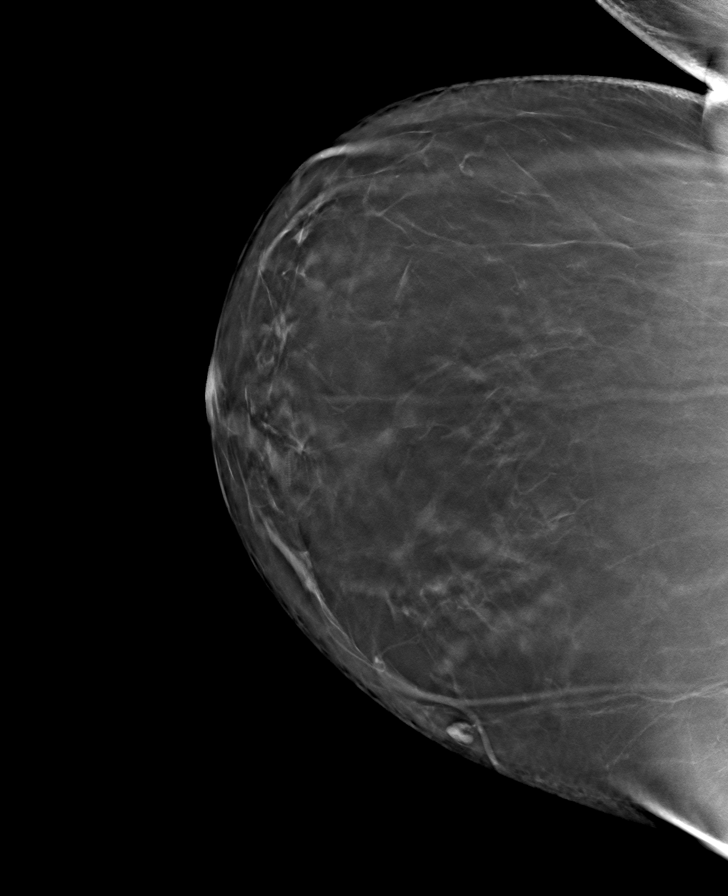

[R MLO tomo · tomo slice 55/110.0]
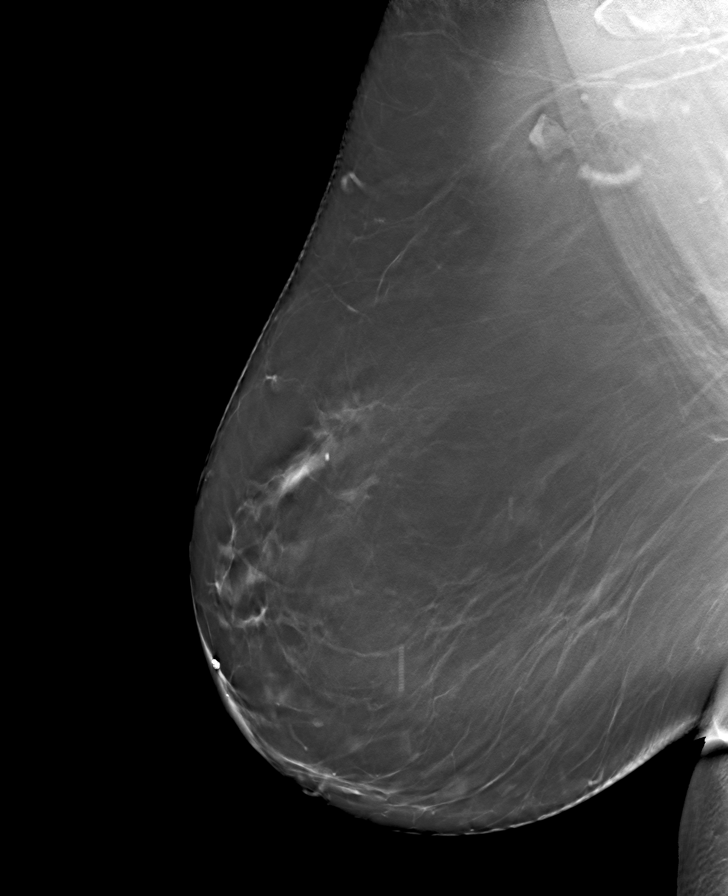

[L MLO tomo · tomo slice 55/109.0]
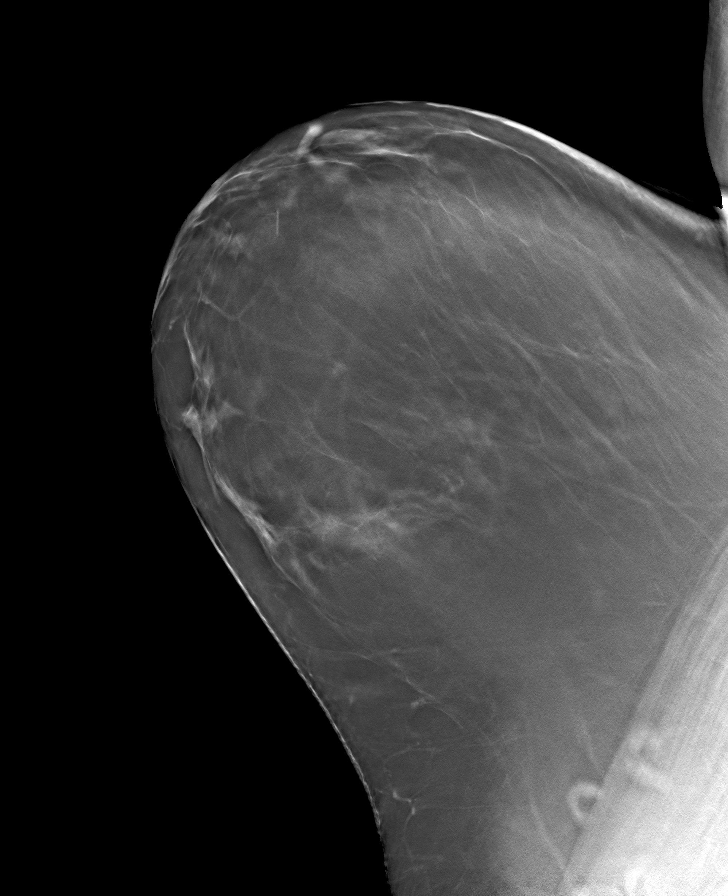

[8 of 24 positions shown; findings below may reference images not displayed]

ACR Breast Density Category b: There are scattered areas of
fibroglandular density.
FINDINGS: There are no findings suspicious for malignancy. Images were
processed with CAD.
IMPRESSION: No mammographic evidence of malignancy. A result letter of this
screening mammogram will be mailed directly to the patient.

RECOMMENDATION:
Screening mammogram in one year. (Code:[TQ])

BI-RADS CATEGORY  1: Negative.

## 2019-11-06 MED ORDER — METFORMIN HCL 500 MG PO TABS: 1000.0000 mg | ORAL_TABLET | Freq: Two times a day (BID) | ORAL | 1 refills | Status: DC

## 2019-11-06 MED ORDER — BLOOD GLUCOSE MONITOR KIT: PACK | 0 refills | Status: AC

## 2019-11-06 NOTE — Progress Notes (Signed)
Name: Tina Stephenson   MRN: [id number]    DOB: 1955/03/07   Date:11/06/2019       Progress Note  Chief Complaint  Patient presents with  . Diabetes    new onset      Subjective:   Tina Stephenson is a 64 y.o. female, presents to clinic for new onset DM, f/up on recent labs  New onset T2DM Lab Results  Component Value Date   HGBA1C 6.9 (H) 10/01/2019   Dx with recent CPE labs, previously prediabetes - labs only go back to 2020 Denies: Polyuria, polydipsia, vision changes, neuropathy, hypoglycemia Recent pertinent labs: Lab Results  Component Value Date   HGBA1C 6.9 (H) 10/01/2019   HGBA1C 6.3 (A) 08/22/2018   Standard of care and health maintenance: Urine Microalbumin:  due Foot exam:  due DM eye exam:  due ACEI/ARB:  On lisinopril previously Statin:  Not on - discussed today  Pt has family hx of dm in brother comorbidities of morbid obesity, HTN, HLD (not treated)   Current Outpatient Medications:  .  albuterol (PROVENTIL) (2.5 MG/3ML) 0.083% nebulizer solution, Take 3 mLs (2.5 mg total) by nebulization every 6 (six) hours as needed for wheezing or shortness of breath., Disp: 150 mL, Rfl: 0 .  DEXILANT 30 MG capsule, TAKE 1 CAPSULE(30 MG) BY MOUTH DAILY, Disp: 90 capsule, Rfl: 1 .  lisinopril (ZESTRIL) 10 MG tablet, TAKE 1 TABLET(10 MG) BY MOUTH DAILY, Disp: 90 tablet, Rfl: 0 .  SUMAtriptan (IMITREX) 25 MG tablet, Take 25-50 mg po at migraine onset, and may repeat dose in 2 hours as needed for unresolved HA, Maximum daily dose 200 mg in 24 hours, Disp: 20 tablet, Rfl: 3 .  cyclobenzaprine (FLEXERIL) 5 MG tablet, Take 1 tablet (5 mg total) by mouth 3 (three) times daily as needed., Disp: 9 tablet, Rfl: 0 .  lisinopril (ZESTRIL) 10 MG tablet, Take 1 tablet (10 mg total) by mouth daily. (Patient not taking: Reported on 11/06/2019), Disp: 90 tablet, Rfl: 3  Patient Active Problem List   Diagnosis Date Noted  . Cervical polyp 08/22/2018  . Nocturia 08/22/2018  . Intertrigo  08/22/2018  . Type 2 diabetes mellitus ([place]) 08/22/2018  . 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 History of migraine 03/25/2018  . Scoliosis 03/25/2018  . History of cyst of breast 03/25/2018  . Sacrum and coccyx fracture, sequela 02/21/2018  . Class 3 severe obesity due to excess calories without serious comorbidity with body mass index (BMI) of 45.0 to 49.9 in adult ([place]) 02/21/2018  . Cough 02/21/2018  . Onychomycosis 02/21/2018  . History of hypertension 02/21/2018  . Vitamin D deficiency 03/19/2015    Past Surgical History:  Procedure Laterality Date  . APPENDECTOMY    . BREAST CYST EXCISION Left    x2  . BREAST CYST EXCISION Right    unable to see scar  . NASAL SEPTUM SURGERY    . WISDOM TOOTH EXTRACTION      Family History  Problem Relation Age of Onset  . COPD Mother   . Cancer Mother   . Heart disease Father   . Breast cancer Paternal Aunt   . Ovarian cancer Neg Hx   . Colon cancer Neg Hx     Social History   Tobacco Use  . Smoking status: Never Smoker  . Smokeless tobacco: Never Used  Vaping Use  . Vaping Use: Never used  Substance Use Topics  . Alcohol  use: Never  . Drug use: Never     Allergies  Allergen Reactions  . Penicillins Other (See Comments)    "lose my hearing"    Health Maintenance  Topic Date Due  . PNEUMOCOCCAL POLYSACCHARIDE VACCINE AGE 26-64 HIGH RISK  Never done  . FOOT EXAM  Never done  . COVID-19 Vaccine (1) Never done  . MAMMOGRAM  03/09/2019  . INFLUENZA VACCINE  04/08/2020 (Originally 08/10/2019)  . HEMOGLOBIN A1C  03/30/2020  . OPHTHALMOLOGY EXAM  09/16/2020  . PAP SMEAR-Modifier  08/21/2021  . COLONOSCOPY  12/07/2025  . TETANUS/TDAP  08/21/2028  . Hepatitis C Screening  Completed  . HIV Screening  Completed    Chart Review Today: I personally reviewed active problem list, medication list, allergies, family history, social history, health maintenance, notes from  last encounter, lab results, imaging with the patient/caregiver today.   Review of Systems  10 Systems reviewed and are negative for acute change except as noted in the HPI.  Objective:   Vitals:   11/06/19 1423  BP: 134/68  Pulse: 96  Resp: 16  Temp: 98 F (36.7 C)  TempSrc: Oral  SpO2: 98%  Weight: 268 lb 8 oz (121.8 kg)  Height: 5' 4" (1.626 m)    Body mass index is 46.09 kg/m.  Physical Exam Vitals and nursing note reviewed.  Constitutional:      General: She is not in acute distress.    Appearance: She is well-developed. She is obese. She is not toxic-appearing or diaphoretic.  HENT:     Head: Normocephalic and atraumatic.  Eyes:     General:        Right eye: No discharge.        Left eye: No discharge.     Conjunctiva/sclera: Conjunctivae normal.  Neck:     Trachea: No tracheal deviation.  Cardiovascular:     Rate and Rhythm: Normal rate.  Pulmonary:     Effort: Pulmonary effort is normal. No respiratory distress.     Breath sounds: No stridor.  Skin:    General: Skin is warm and dry.     Findings: No rash.  Neurological:     Mental Status: She is alert. Mental status is at baseline.     Motor: No abnormal muscle tone.  Psychiatric:        Mood and Affect: Mood normal.        Behavior: Behavior normal.      Diabetic Foot Exam - Simple   Simple Foot Form Diabetic Foot exam was performed with the following findings: Yes 11/06/2019  3:00 PM  Visual Inspection Sensation Testing Pulse Check Comments       Assessment & Plan:     ICD-10-CM   1. Type 2 diabetes mellitus without complication, without long-term current use of insulin (HCC)  E11.9 Referral to Nutrition and Diabetes Services    metFORMIN (GLUCOPHAGE) 500 MG tablet    blood glucose meter kit and supplies KIT  2. Class 3 severe obesity due to excess calories without serious comorbidity with body mass index (BMI) of 45.0 to 49.9 in adult [facility])  E66.01 Referral to Nutrition and Diabetes  Services   Z68.42   3. New onset type 2 diabetes mellitus ([place])  E11.9 Referral to Nutrition and Diabetes Services    metFORMIN (GLUCOPHAGE) 500 MG tablet    blood glucose meter kit and supplies KIT   Spoke with pt about new diagnosis.  Discussed A1C results with them and explained what  an A1C is, basic pathophysiology of DM Type 2, basic home care, basic diabetes diet nutrition principles, importance of checking CBGs and maintaining good CBG control to prevent long-term and short-term complications.  Reviewed signs and symptoms of hyperglycemia and hypoglycemia and how to treat hypoglycemia at home.  Also reviewed blood sugar goals and A1c goals for home.    Pt agreed to start metformin, consult with dietician for DM and nutritional services Meter and supplies given - pt can check periodically to monitor progress or more often prn  Reviewed dosing with metformin, start low and go slow, reviewed possible SE  F/up here in 3 months Handout given for nutritional efforts with dm and DM standard of care  Return for f/up after 12/22 for repeat A1C, DM obesity.   Delsa Grana, PA-C 11/06/19 2:45 PM

## 2019-11-06 NOTE — Patient Instructions (Signed)
Diabetes Mellitus and Nutrition, Adult When you have diabetes (diabetes mellitus), it is very important to have healthy eating habits because your blood sugar (glucose) levels are greatly affected by what you eat and drink. Eating healthy foods in the appropriate amounts, at about the same times every day, can help you:  Control your blood glucose.  Lower your risk of heart disease.  Improve your blood pressure.  Reach or maintain a healthy weight. Every person with diabetes is different, and each person has different needs for a meal plan. Your health care provider may recommend that you work with a diet and nutrition specialist (dietitian) to make a meal plan that is best for you. Your meal plan may vary depending on factors such as:  The calories you need.  The medicines you take.  Your weight.  Your blood glucose, blood pressure, and cholesterol levels.  Your activity level.  Other health conditions you have, such as heart or kidney disease. How do carbohydrates affect me? Carbohydrates, also called carbs, affect your blood glucose level more than any other type of food. Eating carbs naturally raises the amount of glucose in your blood. Carb counting is a method for keeping track of how many carbs you eat. Counting carbs is important to keep your blood glucose at a healthy level, especially if you use insulin or take certain oral diabetes medicines. It is important to know how many carbs you can safely have in each meal. This is different for every person. Your dietitian can help you calculate how many carbs you should have at each meal and for each snack. Foods that contain carbs include:  Bread, cereal, rice, pasta, and crackers.  Potatoes and corn.  Peas, beans, and lentils.  Milk and yogurt.  Fruit and juice.  Desserts, such as cakes, cookies, ice cream, and candy. How does alcohol affect me? Alcohol can cause a sudden decrease in blood glucose (hypoglycemia),  especially if you use insulin or take certain oral diabetes medicines. Hypoglycemia can be a life-threatening condition. Symptoms of hypoglycemia (sleepiness, dizziness, and confusion) are similar to symptoms of having too much alcohol. If your health care provider says that alcohol is safe for you, follow these guidelines:  Limit alcohol intake to no more than 1 drink per day for nonpregnant women and 2 drinks per day for men. One drink equals 12 oz of beer, 5 oz of wine, or 1 oz of hard liquor.  Do not drink on an empty stomach.  Keep yourself hydrated with water, diet soda, or unsweetened iced tea.  Keep in mind that regular soda, juice, and other mixers may contain a lot of sugar and must be counted as carbs. What are tips for following this plan?  Reading food labels  Start by checking the serving size on the "Nutrition Facts" label of packaged foods and drinks. The amount of calories, carbs, fats, and other nutrients listed on the label is based on one serving of the item. Many items contain more than one serving per package.  Check the total grams (g) of carbs in one serving. You can calculate the number of servings of carbs in one serving by dividing the total carbs by 15. For example, if a food has 30 g of total carbs, it would be equal to 2 servings of carbs.  Check the number of grams (g) of saturated and trans fats in one serving. Choose foods that have low or no amount of these fats.  Check the number  of milligrams (mg) of salt (sodium) in one serving. Most people should limit total sodium intake to less than 2,300 mg per day.  Always check the nutrition information of foods labeled as "low-fat" or "nonfat". These foods may be higher in added sugar or refined carbs and should be avoided.  Talk to your dietitian to identify your daily goals for nutrients listed on the label. Shopping  Avoid buying canned, premade, or processed foods. These foods tend to be high in fat, sodium,  and added sugar.  Shop around the outside edge of the grocery store. This includes fresh fruits and vegetables, bulk grains, fresh meats, and fresh dairy. Cooking  Use low-heat cooking methods, such as baking, instead of high-heat cooking methods like deep frying.  Cook using healthy oils, such as olive, canola, or sunflower oil.  Avoid cooking with butter, cream, or high-fat meats. Meal planning  Eat meals and snacks regularly, preferably at the same times every day. Avoid going long periods of time without eating.  Eat foods high in fiber, such as fresh fruits, vegetables, beans, and whole grains. Talk to your dietitian about how many servings of carbs you can eat at each meal.  Eat 4-6 ounces (oz) of lean protein each day, such as lean meat, chicken, fish, eggs, or tofu. One oz of lean protein is equal to: ? 1 oz of meat, chicken, or fish. ? 1 egg. ?  cup of tofu.  Eat some foods each day that contain healthy fats, such as avocado, nuts, seeds, and fish. Lifestyle  Check your blood glucose regularly.  Exercise regularly as told by your health care provider. This may include: ? 150 minutes of moderate-intensity or vigorous-intensity exercise each week. This could be brisk walking, biking, or water aerobics. ? Stretching and doing strength exercises, such as yoga or weightlifting, at least 2 times a week.  Take medicines as told by your health care provider.  Do not use any products that contain nicotine or tobacco, such as cigarettes and e-cigarettes. If you need help quitting, ask your health care provider.  Work with a Social worker or diabetes educator to identify strategies to manage stress and any emotional and social challenges. Questions to ask a health care provider  Do I need to meet with a diabetes educator?  Do I need to meet with a dietitian?  What number can I call if I have questions?  When are the best times to check my blood glucose? Where to find more  information:  American Diabetes Association: diabetes.org  Academy of Nutrition and Dietetics: www.eatright.[employer] of Diabetes and Digestive and Kidney Diseases (NIH): [employer] Summary  A healthy meal plan will help you control your blood glucose and maintain a healthy lifestyle.  Working with a diet and nutrition specialist (dietitian) can help you make a meal plan that is best for you.  Keep in mind that carbohydrates (carbs) and alcohol have immediate effects on your blood glucose levels. It is important to count carbs and to use alcohol carefully. This information is not intended to replace advice given to you by your health care provider. Make sure you discuss any questions you have with your health care provider. Document Revised: 12/08/2016 Document Reviewed: 01/31/2016 Elsevier Patient Education  2020 [employer].   Diabetes Mellitus and Standards of Medical Care Managing diabetes (diabetes mellitus) can be complicated. Your diabetes treatment may be managed by a team of health care providers, including:  A physician who specializes in diabetes (endocrinologist).  A nurse practitioner or physician assistant.  Nurses.  A diet and nutrition specialist (registered dietitian).  A certified diabetes educator (CDE).  An exercise specialist.  A pharmacist.  An eye doctor.  A foot specialist (podiatrist).  A dentist.  A primary care provider.  A mental health provider. Your health care providers follow guidelines to help you get the best quality of care. The following schedule is a general guideline for your diabetes management plan. Your health care providers may give you more specific instructions. Physical exams Upon being diagnosed with diabetes mellitus, and each year after that, your health care provider will ask about your medical and family history. He or she will also do a physical exam. Your exam may include:  Measuring your  height, weight, and body mass index (BMI).  Checking your blood pressure. This will be done at every routine medical visit. Your target blood pressure may vary depending on your medical conditions, your age, and other factors.  Thyroid gland exam.  Skin exam.  Screening for damage to your nerves (peripheral neuropathy). This may include checking the pulse in your legs and feet and checking the level of sensation in your hands and feet.  A complete foot exam to inspect the structure and skin of your feet, including checking for cuts, bruises, redness, blisters, sores, or other problems.  Screening for blood vessel (vascular) problems, which may include checking the pulse in your legs and feet and checking your temperature. Blood tests Depending on your treatment plan and your personal needs, you may have the following tests done:  HbA1c (hemoglobin A1c). This test provides information about blood sugar (glucose) control over the previous 2-3 months. It is used to adjust your treatment plan, if needed. This test will be done: ? At least 2 times a year, if you are meeting your treatment goals. ? 4 times a year, if you are not meeting your treatment goals or if treatment goals have changed.  Lipid testing, including total, LDL, and HDL cholesterol and triglyceride levels. ? The goal for LDL is less than 100 mg/dL (5.5 mmol/L). If you are at high risk for complications, the goal is less than 70 mg/dL (3.9 mmol/L). ? The goal for HDL is 40 mg/dL (2.2 mmol/L) or higher for men and 50 mg/dL (2.8 mmol/L) or higher for women. An HDL cholesterol of 60 mg/dL (3.3 mmol/L) or higher gives some protection against heart disease. ? The goal for triglycerides is less than 150 mg/dL (8.3 mmol/L).  Liver function tests.  Kidney function tests.  Thyroid function tests. Dental and eye exams  Visit your dentist two times a year.  If you have type 1 diabetes, your health care provider may recommend an eye  exam 3-5 years after you are diagnosed, and then once a year after your first exam. ? For children with type 1 diabetes, a health care provider may recommend an eye exam when your child is age 77 or older and has had diabetes for 3-5 years. After the first exam, your child should get an eye exam once a year.  If you have type 2 diabetes, your health care provider may recommend an eye exam as soon as you are diagnosed, and then once a year after your first exam. Immunizations   The yearly flu (influenza) vaccine is recommended for everyone 6 months or older who has diabetes.  The pneumonia (pneumococcal) vaccine is recommended for everyone 2 years or older who has diabetes. If you are 65 or  older, you may get the pneumonia vaccine as a series of two separate shots.  The hepatitis B vaccine is recommended for adults shortly after being diagnosed with diabetes.  Adults and children with diabetes should receive all other vaccines according to age-specific recommendations from the Centers for Disease Control and Prevention (CDC). Mental and emotional health Screening for symptoms of eating disorders, anxiety, and depression is recommended at the time of diagnosis and afterward as needed. If your screening shows that you have symptoms (positive screening result), you may need more evaluation and you may work with a mental health care provider. Treatment plan Your treatment plan will be reviewed at every medical visit. You and your health care provider will discuss:  How you are taking your medicines, including insulin.  Any side effects you are experiencing.  Your blood glucose target goals.  The frequency of your blood glucose monitoring.  Lifestyle habits, such as activity level as well as tobacco, alcohol, and substance use. Diabetes self-management education Your health care provider will assess how well you are monitoring your blood glucose levels and whether you are taking your insulin  correctly. He or she may refer you to:  A certified diabetes educator to manage your diabetes throughout your life, starting at diagnosis.  A registered dietitian who can create or review your personal nutrition plan.  An exercise specialist who can discuss your activity level and exercise plan. Summary  Managing diabetes (diabetes mellitus) can be complicated. Your diabetes treatment may be managed by a team of health care providers.  Your health care providers follow guidelines in order to help you get the best quality of care.  Standards of care including having regular physical exams, blood tests, blood pressure monitoring, immunizations, screening tests, and education about how to manage your diabetes.  Your health care providers may also give you more specific instructions based on your individual health. This information is not intended to replace advice given to you by your health care provider. Make sure you discuss any questions you have with your health care provider. Document Revised: 09/14/2017 Document Reviewed: 09/24/2015 Elsevier Patient Education  [place].

## 2019-11-07 ENCOUNTER — Encounter: Payer: Self-pay | Admitting: Family Medicine

## 2019-11-17 ENCOUNTER — Encounter: Payer: Managed Care, Other (non HMO) | Attending: Family Medicine | Admitting: *Deleted

## 2019-11-17 ENCOUNTER — Other Ambulatory Visit: Payer: Self-pay

## 2019-11-17 ENCOUNTER — Encounter: Payer: Managed Care, Other (non HMO) | Admitting: Dietician

## 2019-11-17 ENCOUNTER — Encounter: Payer: Self-pay | Admitting: *Deleted

## 2019-11-17 VITALS — BP 150/86 | Ht 63.0 in | Wt 266.2 lb

## 2019-11-17 VITALS — Ht 63.0 in | Wt 266.2 lb

## 2019-11-17 DIAGNOSIS — Z6841 Body Mass Index (BMI) 40.0 and over, adult: Secondary | ICD-10-CM | POA: Diagnosis not present

## 2019-11-17 DIAGNOSIS — E119 Type 2 diabetes mellitus without complications: Secondary | ICD-10-CM

## 2019-11-17 NOTE — Patient Instructions (Signed)
Check blood sugars 1 x day before breakfast or  2 hrs after one meal every day Bring blood sugar records to the next class  Exercise: Begin chair exercises for 5-10 minutes 3 days a week and increase as tolerated  Eat 3 meals day,  1-2 snacks a day Space meals 4-6 hours apart Avoid sugar sweetened drinks (soda, juices) Limit desserts/sweets and fried foods  Return for classes on:

## 2019-11-17 NOTE — Progress Notes (Signed)
Diabetes Self-Management Education  Visit Type: First/Initial  Appt. Start Time: 1535 Appt. End Time: 0160  11/17/2019  [name], identified by name and date of birth, is a 64 y.o. female with a diagnosis of Diabetes: Type 2.   ASSESSMENT  Blood pressure (!) 150/86, height 5\' 3"  (1.6 m), weight 266 lb 3.2 oz (120.7 kg). Body mass index is 47.16 kg/m.   Diabetes Self-Management Education - 11/17/19 1711      Visit Information   Visit Type First/Initial      Initial Visit   Diabetes Type Type 2    Are you currently following a meal plan? No    Are you taking your medications as prescribed? Yes    Date Diagnosed 1 week      Health Coping   How would you rate your overall health? Poor      Psychosocial Assessment   Patient Belief/Attitude about Diabetes Other (comment)   "just another problem"   Self-care barriers None    Self-management support Doctor's office;Family    Patient Concerns Nutrition/Meal planning;Medication;Monitoring;Healthy Lifestyle;Problem Solving;Glycemic Control;Weight Control    Special Needs None    Preferred Learning Style Auditory;Visual;Hands on    Learning Readiness Ready    How often do you need to have someone help you when you read instructions, pamphlets, or other written materials from your doctor or pharmacy? 1 - Never    What is the last grade level you completed in school? Assoc degree      Pre-Education Assessment   Patient understands the diabetes disease and treatment process. Needs Instruction    Patient understands incorporating nutritional management into lifestyle. Needs Instruction    Patient undertands incorporating physical activity into lifestyle. Needs Instruction    Patient understands using medications safely. Needs Instruction    Patient understands monitoring blood glucose, interpreting and using results Needs Instruction    Patient understands prevention, detection, and treatment of acute complications. Needs  Instruction    Patient understands prevention, detection, and treatment of chronic complications. Needs Instruction    Patient understands how to develop strategies to address psychosocial issues. Needs Instruction    Patient understands how to develop strategies to promote health/change behavior. Needs Instruction      Complications   Last HgB A1C per patient/outside source 6.9 %   10/01/2019   How often do you check your blood sugar? 0 times/day (not testing)   Pt brought her meter and was instructed on use. BG upon return demonstration was 125 mg/dL at 4:35 pm - 2 hrs pp.   Have you had a dilated eye exam in the past 12 months? Yes    Have you had a dental exam in the past 12 months? Yes    Are you checking your feet? Yes    How many days per week are you checking your feet? 7      Dietary Intake   Breakfast belvita crackers; oatmeal; eggs and bacon, occasional toast or English muffin    Lunch almond milk with protein powder; fast foods of hamburger and fries with soda; salad with cheese or processed meat and chips    Snack (afternoon) almonds    Dinner chicken or beef; baked potatoes; french cut green beans, occasional rice, pasta 1-2 x month; lettuce, tomatoes, onions, peppers, beets, broccoli, slaw    Beverage(s) water, soda, diet soda      Exercise   Exercise Type ADL's      Patient Education   Previous Diabetes Education  No    Disease state  Definition of diabetes, type 1 and 2, and the diagnosis of diabetes;Factors that contribute to the development of diabetes    Nutrition management  Role of diet in the treatment of diabetes and the relationship between the three main macronutrients and blood glucose level;Food label reading, portion sizes and measuring food.;Reviewed blood glucose goals for pre and post meals and how to evaluate the patients' food intake on their blood glucose level.    Physical activity and exercise  Role of exercise on diabetes management, blood pressure  control and cardiac health.    Medications Reviewed patients medication for diabetes, action, purpose, timing of dose and side effects.    Monitoring Taught/evaluated SMBG meter.;Purpose and frequency of SMBG.;Taught/discussed recording of test results and interpretation of SMBG.;Identified appropriate SMBG and/or A1C goals.    Chronic complications Relationship between chronic complications and blood glucose control    Psychosocial adjustment Role of stress on diabetes;Identified and addressed patients feelings and concerns about diabetes      Individualized Goals (developed by patient)   Reducing Risk Other (comment) improve blood sugars, decrease medications, prevent diabetes complications, lose weight, lead a healthier lifestyle, become more fit     Outcomes   Expected Outcomes Demonstrated interest in learning. Expect positive outcomes           Individualized Plan for Diabetes Self-Management Training:   Learning Objective:  Patient will have a greater understanding of diabetes self-management. Patient education plan is to attend individual and/or group sessions per assessed needs and concerns.   Plan:   Patient Instructions  Check blood sugars 1 x day before breakfast or  2 hrs after one meal every day Bring blood sugar records to the next class Exercise: Begin chair exercises for 5-10 minutes 3 days a week and increase as tolerated Eat 3 meals day,  1-2 snacks a day Space meals 4-6 hours apart Avoid sugar sweetened drinks (soda, juices) Limit desserts/sweets and fried foods  Expected Outcomes:  Demonstrated interest in learning. Expect positive outcomes  Education material provided:  General Meal Planning Guidelines Simple Meal Plan  If problems or questions, patient to contact team via:  Johny Drilling, RN, [place] [phone]  Future DSME appointment:  November 17, 2019 for Diabetes Class 1

## 2019-11-17 NOTE — Progress Notes (Signed)
Appt. Start Time: 1720 Appt. End Time: 2020  Class 1 Diabetes Overview - define DM; state own type of DM; identify functions of pancreas and insulin; define insulin deficiency vs insulin resistance  Psychosocial - identify DM as a source of stress; state the effects of stress on BG control  Nutritional Management - describe effects of food on blood glucose; identify sources of carbohydrate, protein and fat; verbalize the importance of balance meals in controlling blood glucose  Exercise - describe the effects of exercise on blood glucose and importance of regular exercise in controlling diabetes; state a plan for personal exercise; verbalize contraindications for exercise  Self-Monitoring - state importance of SMBG; use SMBG results to effectively manage diabetes; identify importance of regular HbA1C testing and goals for results  Acute Complications - recognize hyperglycemia and hypoglycemia with causes and effects; identify blood glucose results as high, low or in control; list steps in treating and preventing high and low blood glucose  Chronic Complications/Foot, Skin, Eye Dental Care - identify possible long-term complications of diabetes (retinopathy, neuropathy, nephropathy, cardiovascular disease, infections); state importance of daily self-foot exams; describe how to examine feet and what to look for; explain appropriate eye and dental care  Lifestyle Changes/Goals & Health/Community Resources - state benefits of making appropriate lifestyle changes; identify habits that need to change (meals, tobacco, alcohol); identify strategies to reduce risk factors for personal health  Pregnancy/Sexual Health - define gestational diabetes; state importance of good blood glucose control and birth control prior to pregnancy; state importance of good blood glucose control in preventing sexual problems (impotence, vaginal dryness, infections, loss of desire); state relationship of blood glucose control  and pregnancy outcome; describe risk of maternal and fetal complications  Teaching Materials Used: Class 1 Slides/Notebook Diabetes Booklet ID Card  Medic Alert/Medic ID Forms Sleep Evaluation Exercise Handout Planning a Balanced Meal Goals for Class 1

## 2019-11-19 ENCOUNTER — Other Ambulatory Visit: Payer: Self-pay | Admitting: Emergency Medicine

## 2019-11-19 DIAGNOSIS — E119 Type 2 diabetes mellitus without complications: Secondary | ICD-10-CM

## 2019-11-19 MED ORDER — GLUCOSE BLOOD VI STRP: ORAL_STRIP | 1 refills | Status: AC

## 2019-11-19 NOTE — Progress Notes (Unsigned)
One touch

## 2019-11-20 ENCOUNTER — Encounter: Payer: Self-pay | Admitting: Family Medicine

## 2019-11-21 ENCOUNTER — Other Ambulatory Visit: Payer: Self-pay

## 2019-11-21 MED ORDER — LANCETS ULTRA FINE MISC: 1.0000 | Freq: Two times a day (BID) | 2 refills | Status: DC

## 2019-11-24 ENCOUNTER — Encounter: Payer: Self-pay | Admitting: *Deleted

## 2019-11-24 ENCOUNTER — Other Ambulatory Visit: Payer: Self-pay

## 2019-11-24 ENCOUNTER — Encounter: Payer: Managed Care, Other (non HMO) | Admitting: *Deleted

## 2019-11-24 VITALS — Wt 262.7 lb

## 2019-11-24 DIAGNOSIS — E119 Type 2 diabetes mellitus without complications: Secondary | ICD-10-CM | POA: Diagnosis not present

## 2019-11-24 NOTE — Progress Notes (Signed)

## 2019-11-26 ENCOUNTER — Other Ambulatory Visit: Payer: Self-pay | Admitting: Family Medicine

## 2019-11-26 DIAGNOSIS — J45909 Unspecified asthma, uncomplicated: Secondary | ICD-10-CM

## 2019-12-01 ENCOUNTER — Encounter: Payer: Managed Care, Other (non HMO) | Admitting: Dietician

## 2019-12-01 ENCOUNTER — Encounter: Payer: Self-pay | Admitting: Dietician

## 2019-12-01 ENCOUNTER — Other Ambulatory Visit: Payer: Self-pay

## 2019-12-01 VITALS — BP 136/78 | Ht 63.0 in | Wt 261.4 lb

## 2019-12-01 DIAGNOSIS — E119 Type 2 diabetes mellitus without complications: Secondary | ICD-10-CM

## 2019-12-01 NOTE — Progress Notes (Signed)

## 2019-12-03 ENCOUNTER — Encounter: Payer: Self-pay | Admitting: *Deleted

## 2020-01-05 ENCOUNTER — Ambulatory Visit: Payer: Managed Care, Other (non HMO) | Admitting: Family Medicine

## 2020-01-05 ENCOUNTER — Encounter: Payer: Self-pay | Admitting: Family Medicine

## 2020-01-05 ENCOUNTER — Other Ambulatory Visit: Payer: Self-pay

## 2020-01-05 VITALS — BP 128/76 | HR 96 | Temp 98.0°F | Resp 16 | Ht 63.0 in | Wt 255.3 lb

## 2020-01-05 DIAGNOSIS — I10 Essential (primary) hypertension: Secondary | ICD-10-CM

## 2020-01-05 DIAGNOSIS — Z6841 Body Mass Index (BMI) 40.0 and over, adult: Secondary | ICD-10-CM

## 2020-01-05 DIAGNOSIS — E785 Hyperlipidemia, unspecified: Secondary | ICD-10-CM

## 2020-01-05 DIAGNOSIS — G43909 Migraine, unspecified, not intractable, without status migrainosus: Secondary | ICD-10-CM

## 2020-01-05 DIAGNOSIS — E119 Type 2 diabetes mellitus without complications: Secondary | ICD-10-CM

## 2020-01-05 DIAGNOSIS — L304 Erythema intertrigo: Secondary | ICD-10-CM

## 2020-01-05 NOTE — Progress Notes (Signed)
Name: Tina Stephenson   MRN: [id number]    DOB: [birth year]   Date:01/05/2020       Progress Note  Chief Complaint  Patient presents with  . Diabetes  . Hypertension     Subjective:   Tina Stephenson is a 64 y.o. female, presents to clinic for f/up on new dx DM  Pt presented 09/2019 for her CPE, lab showed new onset T2DM - A1C 6.9, plan as follows: 1. Type 2 diabetes mellitus without complication, without long-term current use of insulin (HCC)  E11.9 Referral to Nutrition and Diabetes Services    metFORMIN (GLUCOPHAGE) 500 MG tablet    blood glucose meter kit and supplies KIT  2. Class 3 severe obesity due to excess calories without serious comorbidity with body mass index (BMI) of 45.0 to 49.9 in adult [facility])  E66.01 Referral to Nutrition and Diabetes Services   Z68.42   3. New onset type 2 diabetes mellitus ([place])  E11.9 Referral to Nutrition and Diabetes Services    metFORMIN (GLUCOPHAGE) 500 MG tablet    blood glucose meter kit and supplies KIT   Spoke with pt about new diagnosis.  Discussed A1C results with them and explained what an A1C is, basic pathophysiology of DM Type 2, basic home care, basic diabetes diet nutrition principles, importance of checking CBGs and maintaining good CBG control to prevent long-term and short-term complications.  Reviewed signs and symptoms of hyperglycemia and hypoglycemia and how to treat hypoglycemia at home.  Also reviewed blood sugar goals and A1c goals for home.    Pt agreed to start metformin, consult with dietician for DM and nutritional services Meter and supplies given - pt can check periodically to monitor progress or more often prn  Reviewed dosing with metformin, start low and go slow, reviewed possible SE  F/up here in 3 months Handout given for nutritional efforts with dm and DM standard of care  Return for f/up after 12/22 for repeat A1C, DM obesity.   Here for f/up  Taking metformin 1000 mg BID, no SE, doing well She  met with nutritionist for DM education and dietician consult- lost some weight   Blood sugars checking about 3x a week - have been ranging 80-100's   Hypertension:  Currently managed on lisinopril 10 mg  Pt reports good med compliance and denies any SE.   Blood pressure today is well controlled. BP Readings from Last 3 Encounters:  01/05/20 128/76  12/01/19 136/78  11/17/19 (!) 150/86   Pt denies CP, SOB, exertional sx, LE edema, palpitation, Ha's, visual disturbances, lightheadedness, hypotension, syncope. Dietary efforts for BP?  No salt, working on diet  Hyperlipidemia: Not on statin, previously discussed, doesn't want statin, working on diet/lifestyle, wants to recheck lipids Last Lipids: Lab Results  Component Value Date   CHOL 223 (H) 10/01/2019   HDL 47 10/01/2019   LDLCALC 130 (H) 10/01/2019   TRIG 256 (H) 10/01/2019   CHOLHDL 4.7 (H) 10/01/2019   - Denies: Chest pain, shortness of breath, myalgias, claudication   Migraine 3-4 a month, cannot take meds for when she's working, usually just suffers through with OTC meds, imitrex abortive  Hasn't tried other meds in the past that she can recall  GERD/hiatal hernia:  Sx still bothersome and often, on dexilant, no change   Current Outpatient Medications:  .  acetaminophen (TYLENOL) 500 MG tablet, Take 1,000 mg by mouth 2 (two) times daily., Disp: , Rfl:  .  albuterol (PROVENTIL) (2.5 MG/3ML) 0.083% nebulizer  solution, USE 1 VIAL VIA NEBULIZER EVERY 6 HOURS AS NEEDED FOR WHEEZING OR SHORTNESS OF BREATH, Disp: 150 mL, Rfl: 0 .  Calcium Carbonate-Vitamin D (CALTRATE 600+D PO), Take 1 tablet by mouth daily., Disp: , Rfl:  .  DEXILANT 30 MG capsule, TAKE 1 CAPSULE(30 MG) BY MOUTH DAILY, Disp: 90 capsule, Rfl: 1 .  ibuprofen (ADVIL) 200 MG tablet, Take 200 mg by mouth every 6 (six) hours as needed., Disp: , Rfl:  .  lisinopril (ZESTRIL) 10 MG tablet, Take 1 tablet (10 mg total) by mouth daily., Disp: 90 tablet, Rfl: 3 .   metFORMIN (GLUCOPHAGE) 500 MG tablet, Take 2 tablets (1,000 mg total) by mouth 2 (two) times daily with a meal., Disp: 180 tablet, Rfl: 1 .  SUMAtriptan (IMITREX) 25 MG tablet, Take 25-50 mg po at migraine onset, and may repeat dose in 2 hours as needed for unresolved HA, Maximum daily dose 200 mg in 24 hours, Disp: 20 tablet, Rfl: 3 .  blood glucose meter kit and supplies KIT, Dispense based on patient and insurance preference. Use once daily as directed. (FOR ICD-10 E11.9). (Patient not taking: Reported on 01/05/2020), Disp: 1 each, Rfl: 0 .  glucose blood test strip, Use as instructed (Patient not taking: Reported on 01/05/2020), Disp: 100 each, Rfl: 1 .  Lancets Ultra Fine MISC, 1 Device by Does not apply route 2 (two) times daily. (Patient not taking: Reported on 01/05/2020), Disp: 120 each, Rfl: 2  Patient Active Problem List   Diagnosis Date Noted  . Cervical polyp 08/22/2018  . Nocturia 08/22/2018  . Intertrigo 08/22/2018  . Type 2 diabetes mellitus ([place]) 08/22/2018  . 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 History of migraine 03/25/2018  . Scoliosis 03/25/2018  . History of cyst of breast 03/25/2018  . Sacrum and coccyx fracture, sequela 02/21/2018  . Class 3 severe obesity due to excess calories without serious comorbidity with body mass index (BMI) of 45.0 to 49.9 in adult ([place]) 02/21/2018  . Cough 02/21/2018  . Onychomycosis 02/21/2018  . History of hypertension 02/21/2018  . Vitamin D deficiency 03/19/2015    Past Surgical History:  Procedure Laterality Date  . APPENDECTOMY    . NASAL SEPTUM SURGERY    . WISDOM TOOTH EXTRACTION      Family History  Problem Relation Age of Onset  . COPD Mother   . Cancer Mother   . Heart disease Father   . Breast cancer Paternal Aunt   . Diabetes Brother   . Diabetes Maternal Grandfather   . Ovarian cancer Neg Hx   . Colon cancer Neg Hx     Social History   Tobacco Use   . Smoking status: Never Smoker  . Smokeless tobacco: Never Used  Vaping Use  . Vaping Use: Never used  Substance Use Topics  . Alcohol use: Never  . Drug use: Never     Allergies  Allergen Reactions  . Penicillins Other (See Comments)    "lose my hearing"    Health Maintenance  Topic Date Due  . COVID-19 Vaccine (1) 01/21/2020 (Originally 04/19/1967)  . INFLUENZA VACCINE  04/08/2020 (Originally 08/10/2019)  . PNEUMOCOCCAL POLYSACCHARIDE VACCINE AGE 63-64 HIGH RISK  11/19/2020 (Originally 04/20/1957)  . HEMOGLOBIN A1C  03/30/2020  . OPHTHALMOLOGY EXAM  09/16/2020  . FOOT EXAM  11/05/2020  . MAMMOGRAM  11/05/2020  . PAP SMEAR-Modifier  08/21/2021  . COLONOSCOPY (Pts 45-37yr Insurance coverage will need to be  confirmed)  12/07/2025  . TETANUS/TDAP  08/21/2028  . Hepatitis C Screening  Completed  . HIV Screening  Completed    Chart Review Today: I personally reviewed active problem list, medication list, allergies, family history, social history, health maintenance, notes from last encounter, lab results, imaging with the patient/caregiver today.   Review of Systems  10 Systems reviewed and are negative for acute change except as noted in the HPI.  Objective:   Vitals:   01/05/20 0946  BP: 128/76  Pulse: 96  Resp: 16  Temp: 98 F (36.7 C)  TempSrc: Oral  SpO2: 98%  Weight: 255 lb 4.8 oz (115.8 kg)  Height: _0  (1.6 m)    Body mass index is 45.22 kg/m.  Physical Exam Vitals and nursing note reviewed.  Constitutional:      General: She is not in acute distress.    Appearance: Normal appearance. She is well-developed. She is obese. She is not ill-appearing, toxic-appearing or diaphoretic.     Interventions: Face mask in place.  HENT:     Head: Normocephalic and atraumatic.     Right Ear: External ear normal.     Left Ear: External ear normal.  Eyes:     General: Lids are normal. No scleral icterus.       Right eye: No discharge.        Left eye: No  discharge.     Conjunctiva/sclera: Conjunctivae normal.  Neck:     Trachea: Phonation normal. No tracheal deviation.  Cardiovascular:     Rate and Rhythm: Normal rate and regular rhythm.     Pulses: Normal pulses.          Radial pulses are 2+ on the right side and 2+ on the left side.       Posterior tibial pulses are 2+ on the right side and 2+ on the left side.     Heart sounds: Normal heart sounds. No murmur heard. No friction rub. No gallop.   Pulmonary:     Effort: Pulmonary effort is normal. No respiratory distress.     Breath sounds: Normal breath sounds. No stridor. No wheezing, rhonchi or rales.  Chest:     Chest wall: No tenderness.  Abdominal:     General: Bowel sounds are normal. There is no distension.     Palpations: Abdomen is soft.     Tenderness: There is no abdominal tenderness.  Musculoskeletal:     Right lower leg: No edema.     Left lower leg: No edema.  Skin:    General: Skin is warm and dry.     Coloration: Skin is not jaundiced or pale.     Findings: No bruising.  Neurological:     Mental Status: She is alert.     Motor: No abnormal muscle tone.     Gait: Gait normal.  Psychiatric:        Mood and Affect: Mood normal.        Speech: Speech normal.        Behavior: Behavior normal.         Assessment & Plan:     ICD-10-CM   1. Type 2 diabetes mellitus without complication, without long-term current use of insulin (HCC)  E11.9 Lipid panel    Hemoglobin A1c    Comprehensive metabolic panel   well controlled sugars, tolerating metformin 1000 mg BID, working on diet and lifestyle  2. New onset type 2 diabetes mellitus ([place])  E11.9 Lipid panel  Hemoglobin A1c    Comprehensive metabolic panel  3. Essential hypertension  I10 Comprehensive metabolic panel   some high readings recently, but today BP is well controlled, good compliance with lisinopril 10 mg daily, no Sx or SE  4. Dyslipidemia  E78.5 Lipid panel    Comprehensive metabolic panel    discussed statins, indications/standard of care, pt considering, but wants to recheck labs first  5. Class 3 severe obesity due to excess calories without serious comorbidity with body mass index (BMI) of 45.0 to 49.9 in adult (HCC)  E66.01    Z68.42    some weight loss - continue working on diet/exercise/lifestyle changes  6. Migraine without status migrainosus, not intractable, unspecified migraine type  G43.909    3-4 migraines a month, imitrex SE prevent her from using on work days, manages with OTC meds, no other meds in the past other than OTC and imitrex  7. Intertrigo  L30.4    better in winter months, no current rash/irritation, cream is effective when she uses it/needs it     Return for f/up in 4-6 month for DM, HTN, HLD .   Delsa Grana, PA-C 01/05/20 9:53 AM

## 2020-01-06 LAB — LIPID PANEL
Chol/HDL Ratio: 3.7 ratio (ref 0.0–4.4)
Cholesterol, Total: 182 mg/dL (ref 100–199)
HDL: 49 mg/dL (ref >39)
LDL Chol Calc (NIH): 100 mg/dL — ABNORMAL HIGH (ref 0–99)
Triglycerides: 195 mg/dL — ABNORMAL HIGH (ref 0–149)
VLDL Cholesterol Cal: 33 mg/dL (ref 5–40)

## 2020-01-06 LAB — COMPREHENSIVE METABOLIC PANEL
ALT: 31 IU/L (ref 0–32)
AST: 22 IU/L (ref 0–40)
Albumin/Globulin Ratio: 1.9 (ref 1.2–2.2)
Albumin: 4.6 g/dL (ref 3.8–4.8)
Alkaline Phosphatase: 99 IU/L (ref 44–121)
BUN/Creatinine Ratio: 22 (ref 12–28)
BUN: 16 mg/dL (ref 8–27)
Bilirubin Total: 1.1 mg/dL (ref 0.0–1.2)
CO2: 22 mmol/L (ref 20–29)
Calcium: 9.5 mg/dL (ref 8.7–10.3)
Chloride: 103 mmol/L (ref 96–106)
Creatinine, Ser: 0.73 mg/dL (ref 0.57–1.00)
GFR calc Af Amer: 102 mL/min/{1.73_m2} (ref >59)
GFR calc non Af Amer: 89 mL/min/{1.73_m2} (ref >59)
Globulin, Total: 2.4 g/dL (ref 1.5–4.5)
Glucose: 111 mg/dL — ABNORMAL HIGH (ref 65–99)
Potassium: 4.3 mmol/L (ref 3.5–5.2)
Sodium: 142 mmol/L (ref 134–144)
Total Protein: 7 g/dL (ref 6.0–8.5)

## 2020-01-06 LAB — HEMOGLOBIN A1C
Est. average glucose Bld gHb Est-mCnc: 140 mg/dL
Hgb A1c MFr Bld: 6.5 % — ABNORMAL HIGH (ref 4.8–5.6)

## 2020-02-06 ENCOUNTER — Other Ambulatory Visit: Payer: Self-pay

## 2020-02-06 ENCOUNTER — Encounter: Payer: Self-pay | Admitting: Family Medicine

## 2020-02-06 ENCOUNTER — Other Ambulatory Visit: Payer: Self-pay | Admitting: Family Medicine

## 2020-02-06 DIAGNOSIS — E119 Type 2 diabetes mellitus without complications: Secondary | ICD-10-CM

## 2020-02-06 MED ORDER — METFORMIN HCL 500 MG PO TABS: 1000.0000 mg | ORAL_TABLET | Freq: Two times a day (BID) | ORAL | 1 refills | Status: DC

## 2020-02-06 NOTE — Telephone Encounter (Signed)
Copied from [place] [id number]. Topic: Quick Communication - Rx Refill/Question >> Feb 06, 2020 10:40 AM Tessa Lerner A wrote: Medication: metFORMIN (GLUCOPHAGE) 500 MG tablet   Has the patient contacted their pharmacy? Yes - patient shared that they had been in contact with pharmacy   Preferred Pharmacy (with phone number or street name):  [facility] #[id number], [place] Phone: [phone]  Agent: Please be advised that RX refills may take up to 3 business days. We ask that you follow-up with your pharmacy.

## 2020-03-18 ENCOUNTER — Other Ambulatory Visit: Payer: Self-pay | Admitting: Family Medicine

## 2020-03-18 ENCOUNTER — Encounter: Payer: Self-pay | Admitting: Family Medicine

## 2020-03-18 DIAGNOSIS — E785 Hyperlipidemia, unspecified: Secondary | ICD-10-CM

## 2020-03-18 MED ORDER — ROSUVASTATIN CALCIUM 5 MG PO TABS: 5.0000 mg | ORAL_TABLET | Freq: Every day | ORAL | 0 refills | Status: DC

## 2020-03-30 ENCOUNTER — Ambulatory Visit: Payer: Managed Care, Other (non HMO) | Admitting: Family Medicine

## 2020-04-03 ENCOUNTER — Other Ambulatory Visit: Payer: Self-pay | Admitting: Gastroenterology

## 2020-04-05 ENCOUNTER — Encounter: Payer: Self-pay | Admitting: Gastroenterology

## 2020-04-05 ENCOUNTER — Other Ambulatory Visit: Payer: Self-pay

## 2020-04-05 ENCOUNTER — Ambulatory Visit: Payer: Managed Care, Other (non HMO) | Admitting: Gastroenterology

## 2020-04-05 VITALS — BP 149/77 | HR 80 | Ht 63.0 in | Wt 253.6 lb

## 2020-04-05 DIAGNOSIS — K760 Fatty (change of) liver, not elsewhere classified: Secondary | ICD-10-CM | POA: Diagnosis not present

## 2020-04-05 DIAGNOSIS — K219 Gastro-esophageal reflux disease without esophagitis: Secondary | ICD-10-CM | POA: Diagnosis not present

## 2020-04-05 DIAGNOSIS — K824 Cholesterolosis of gallbladder: Secondary | ICD-10-CM

## 2020-04-05 NOTE — Progress Notes (Signed)
Jonathon Bellows MD, MRCP(U.K) [address]  [place]  [place], [place] [address]  Main: [phone]  Fax: [phone]   Primary Care Physician: Delsa Grana, PA-C  Primary Gastroenterologist:  Dr. Jonathon Bellows   Follow-up for gallbladder polyp and nonalcoholic fatty liver disease  HPI: Tina Stephenson is a 65 y.o. female   Summary of history :  She was previously seen back in March 2021 for right upper quadrant pain of 1 month duration, occasional heartburn, NAFLD, gallbladder polyp and mildly elevated alkaline phosphatase.  Last colonoscopy in 2016 - no family , no family history of colon polyps or cancer  06/25/2018: H. pylori breath test: Negative 08/02/2018: Testing for viral hepatitis and autoimmune hepatitis is normal 08/02/2018: RUQ USG- fatty liverand 4 mm gall bladder polyp 09/19/2018: HIDA scan shows ejection fraction of 94% which is normal. 10/01/2018: CT scan of the abdomen with contrast: 2.1 x 2.3 hypervascular structure in the left liver 10/17/2018: MRI of the liver showed 2.3 cm benign hemangioma left hepatic lobe    Interval history3/30/2021-04/05/2020  Completed hepatitis A and B vaccination series  08/12/2019: Right upper quadrant ultrasound: Stable 4 x 4 millimeter gallbladder polyp.  No further imaging of surveillance required.  Hepatic steatosis noted.  Since her last visit she is doing well.  She has lost about 20 pounds intentionally.  She states her reflux is doing very well.  She is on Dexilant 30 mg once a day.  She has had her hepatitis A and B vaccines completed.  No other complaints.    Current Outpatient Medications  Medication Sig Dispense Refill  . albuterol (PROVENTIL) (2.5 MG/3ML) 0.083% nebulizer solution USE 1 VIAL VIA NEBULIZER EVERY 6 HOURS AS NEEDED FOR WHEEZING OR SHORTNESS OF BREATH 150 mL 0  . Calcium Carbonate-Vitamin D (CALTRATE 600+D PO) Take 1 tablet by mouth daily.    Marland Kitchen DEXILANT 30 MG capsule TAKE 1 CAPSULE(30 MG) BY MOUTH  DAILY 90 capsule 1  . ibuprofen (ADVIL) 200 MG tablet Take 200 mg by mouth every 6 (six) hours as needed.    Marland Kitchen lisinopril (ZESTRIL) 10 MG tablet Take 1 tablet (10 mg total) by mouth daily. 90 tablet 3  . metFORMIN (GLUCOPHAGE) 500 MG tablet Take 2 tablets (1,000 mg total) by mouth 2 (two) times daily with a meal. 240 tablet 1  . rosuvastatin (CRESTOR) 5 MG tablet Take 1 tablet (5 mg total) by mouth daily. 90 tablet 0  . SUMAtriptan (IMITREX) 25 MG tablet Take 25-50 mg po at migraine onset, and may repeat dose in 2 hours as needed for unresolved HA, Maximum daily dose 200 mg in 24 hours 20 tablet 3  . acetaminophen (TYLENOL) 500 MG tablet Take 1,000 mg by mouth 2 (two) times daily. (Patient not taking: Reported on 04/05/2020)    . blood glucose meter kit and supplies KIT Dispense based on patient and insurance preference. Use once daily as directed. (FOR ICD-10 E11.9). (Patient not taking: No sig reported) 1 each 0  . glucose blood test strip Use as instructed (Patient not taking: No sig reported) 100 each 1  . Lancets Ultra Fine MISC 1 Device by Does not apply route 2 (two) times daily. (Patient not taking: No sig reported) 120 each 2   No current facility-administered medications for this visit.    Allergies as of 04/05/2020 - Review Complete 04/05/2020  Allergen Reaction Noted  . Penicillins Other (See Comments) 04/14/2017    ROS:  General: Negative for anorexia, weight loss, fever,  chills, fatigue, weakness. ENT: Negative for hoarseness, difficulty swallowing , nasal congestion. CV: Negative for chest pain, angina, palpitations, dyspnea on exertion, peripheral edema.  Respiratory: Negative for dyspnea at rest, dyspnea on exertion, cough, sputum, wheezing.  GI: See history of present illness. GU:  Negative for dysuria, hematuria, urinary incontinence, urinary frequency, nocturnal urination.  Endo: Negative for unusual weight change.    Physical Examination:   BP (!) 149/77 (BP  Location: Left Arm, Patient Position: Sitting, Cuff Size: Large)   Pulse 80   Ht 5' 3"  (1.6 m)   Wt 253 lb 9.6 oz (115 kg)   BMI 44.92 kg/m   General: Well-nourished, well-developed in no acute distress.  Eyes: No icterus. Conjunctivae pink. Mouth: Oropharyngeal mucosa moist and pink , no lesions erythema or exudate. Lungs: Clear to auscultation bilaterally. Non-labored. Heart: Regular rate and rhythm, no murmurs rubs or gallops.  Abdomen: Bowel sounds are normal, nontender, nondistended, no hepatosplenomegaly or masses, no abdominal bruits or hernia , no rebound or guarding.   Extremities: No lower extremity edema. No clubbing or deformities. Neuro: Alert and oriented x 3.  Grossly intact. Skin: Warm and dry, no jaundice.   Psych: Alert and cooperative, normal mood and affect.   Imaging Studies: No results found.  Assessment and Plan:   Tina Stephenson is a 25 y.o. y/o femalehere to follow upfor She has NAFLD.    The gallbladder polyp that has remained stable and is only 4 mm in size does not require any further monitoring.   Mildly elevated alkaline phosphatase in the past that has normalized when checked in December 2021  Plan :  1. NAFLD: Continue conservative management with exercise, weight loss, healthy diet such as a Mediterranean diet.  Surveillance and intervention for cardiovascular risk factors.  Black coffee caffeinated consumption has been found to decrease risk of fibrosis provided not much sugar/creamer daily has been added to it. 2. We can stop the Dexilant, I have suggested her to see how she does off the [place].  If she does have minimal symptoms can take Pepcid 40 mg once at night and obviously if her symptoms are not controlled then she would have no other choice but to go back on Dexilant 30 mg once a day.  I have suggested that she continue lifestyle changes for acid reflux and congratulated her on the weight loss she had achieved.   Dr Jonathon Bellows  MD,MRCP  [facility]) Follow up in as needed

## 2020-04-15 ENCOUNTER — Other Ambulatory Visit: Payer: Self-pay | Admitting: Family Medicine

## 2020-05-06 ENCOUNTER — Ambulatory Visit: Payer: Managed Care, Other (non HMO) | Admitting: Family Medicine

## 2020-05-06 ENCOUNTER — Other Ambulatory Visit: Payer: Self-pay

## 2020-05-06 ENCOUNTER — Encounter: Payer: Self-pay | Admitting: Family Medicine

## 2020-05-06 VITALS — BP 122/64 | HR 95 | Temp 98.2°F | Resp 16 | Ht 62.0 in | Wt 252.2 lb

## 2020-05-06 DIAGNOSIS — Z5181 Encounter for therapeutic drug level monitoring: Secondary | ICD-10-CM

## 2020-05-06 DIAGNOSIS — E119 Type 2 diabetes mellitus without complications: Secondary | ICD-10-CM | POA: Diagnosis not present

## 2020-05-06 DIAGNOSIS — Z6841 Body Mass Index (BMI) 40.0 and over, adult: Secondary | ICD-10-CM

## 2020-05-06 DIAGNOSIS — E785 Hyperlipidemia, unspecified: Secondary | ICD-10-CM | POA: Diagnosis not present

## 2020-05-06 DIAGNOSIS — I10 Essential (primary) hypertension: Secondary | ICD-10-CM

## 2020-05-06 DIAGNOSIS — M25511 Pain in right shoulder: Secondary | ICD-10-CM

## 2020-05-06 DIAGNOSIS — G8929 Other chronic pain: Secondary | ICD-10-CM

## 2020-05-06 MED ORDER — METFORMIN HCL 500 MG PO TABS: 1000.0000 mg | ORAL_TABLET | Freq: Two times a day (BID) | ORAL | 3 refills | Status: DC

## 2020-05-06 MED ORDER — MELOXICAM 7.5 MG PO TABS: 7.5000 mg | ORAL_TABLET | Freq: Every day | ORAL | 2 refills | Status: DC | PRN

## 2020-05-06 MED ORDER — ROSUVASTATIN CALCIUM 5 MG PO TABS: 5.0000 mg | ORAL_TABLET | Freq: Every day | ORAL | 3 refills | Status: DC

## 2020-05-06 NOTE — Progress Notes (Signed)
Name: Tina Stephenson   MRN: [id number]    DOB: 05/06/1955   Date:05/06/2020       Progress Note  Chief Complaint  Patient presents with  . Follow-up  . Diabetes  . Hypertension  . Hyperlipidemia  . Gastroesophageal Reflux     Subjective:   Tina Stephenson is a 65 y.o. female, presents to clinic for routine f/up  Hyperlipidemia: Currently treated with Crestor 5 mg, pt reports good med compliance Last Lipids: Lab Results  Component Value Date   CHOL 182 01/05/2020   HDL 49 01/05/2020   LDLCALC 100 (H) 01/05/2020   TRIG 195 (H) 01/05/2020   CHOLHDL 3.7 01/05/2020   - Denies: Chest pain, shortness of breath, myalgias, claudication  DM:  Newer onset last year Pt managing DM with metformin 1000 mg bid  Reports good med compliance Pt has no SE from meds. Blood sugars - checking 3x a week -  Denies: Polyuria, polydipsia, vision changes, neuropathy, hypoglycemia Recent pertinent labs: Lab Results  Component Value Date   HGBA1C 6.5 (H) 01/05/2020   HGBA1C 6.9 (H) 10/01/2019   HGBA1C 6.3 (A) 08/22/2018   Standard of care and health maintenance: Foot exam: Up-to-date DM eye exam: Done ACEI/ARB:  on Statin:  on   Hypertension:  Currently managed on lisinopril 10 milligrams daily Pt reports good med compliance and denies any SE.   Blood pressure today is well controlled. BP Readings from Last 3 Encounters:  05/06/20 122/64  04/05/20 (!) 149/77  01/05/20 128/76   Pt denies CP, SOB, exertional sx, LE edema, palpitation, Ha's, visual disturbances, lightheadedness, hypotension, syncope. Dietary efforts for BP?  Working on diet and lifestyle overall    Current Outpatient Medications:  .  acetaminophen (TYLENOL) 500 MG tablet, Take 1,000 mg by mouth 2 (two) times daily., Disp: , Rfl:  .  albuterol (PROVENTIL) (2.5 MG/3ML) 0.083% nebulizer solution, USE 1 VIAL VIA NEBULIZER EVERY 6 HOURS AS NEEDED FOR WHEEZING OR SHORTNESS OF BREATH, Disp: 150 mL, Rfl: 0 .  blood glucose  meter kit and supplies KIT, Dispense based on patient and insurance preference. Use once daily as directed. (FOR ICD-10 E11.9)., Disp: 1 each, Rfl: 0 .  Calcium Carbonate-Vitamin D (CALTRATE 600+D PO), Take 1 tablet by mouth daily., Disp: , Rfl:  .  glucose blood test strip, Use as instructed, Disp: 100 each, Rfl: 1 .  ibuprofen (ADVIL) 200 MG tablet, Take 200 mg by mouth every 6 (six) hours as needed., Disp: , Rfl:  .  Lancets (ONETOUCH DELICA PLUS [id number]) MISC, USE TO CHECK BLOOD SUGAR TWICE DAILY, Disp: 100 each, Rfl: 5 .  lisinopril (ZESTRIL) 10 MG tablet, Take 1 tablet (10 mg total) by mouth daily., Disp: 90 tablet, Rfl: 3 .  metFORMIN (GLUCOPHAGE) 500 MG tablet, Take 2 tablets (1,000 mg total) by mouth 2 (two) times daily with a meal., Disp: 240 tablet, Rfl: 1 .  rosuvastatin (CRESTOR) 5 MG tablet, Take 1 tablet (5 mg total) by mouth daily., Disp: 90 tablet, Rfl: 0 .  SUMAtriptan (IMITREX) 25 MG tablet, Take 25-50 mg po at migraine onset, and may repeat dose in 2 hours as needed for unresolved HA, Maximum daily dose 200 mg in 24 hours, Disp: 20 tablet, Rfl: 3  Patient Active Problem List   Diagnosis Date Noted  . Dyslipidemia 01/05/2020  . Cervical polyp 08/22/2018  . Nocturia 08/22/2018  . Intertrigo 08/22/2018  . Type 2 diabetes mellitus without complication, without long-term current use of insulin ([place]) 08/22/2018  .  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 History of migraine 03/25/2018  . Scoliosis 03/25/2018  . History of cyst of breast 03/25/2018  . Sacrum and coccyx fracture, sequela 02/21/2018  . Class 3 severe obesity due to excess calories without serious comorbidity with body mass index (BMI) of 45.0 to 49.9 in adult ([place]) 02/21/2018  . Cough 02/21/2018  . Onychomycosis 02/21/2018  . History of hypertension 02/21/2018  . Vitamin D deficiency 03/19/2015  . Essential hypertension 03/05/2015    Past Surgical  History:  Procedure Laterality Date  . APPENDECTOMY    . NASAL SEPTUM SURGERY    . WISDOM TOOTH EXTRACTION      Family History  Problem Relation Age of Onset  . COPD Mother   . Cancer Mother   . Heart disease Father   . Breast cancer Paternal Aunt   . Diabetes Brother   . Diabetes Maternal Grandfather   . Ovarian cancer Neg Hx   . Colon cancer Neg Hx     Social History   Tobacco Use  . Smoking status: Never Smoker  . Smokeless tobacco: Never Used  Vaping Use  . Vaping Use: Never used  Substance Use Topics  . Alcohol use: Never  . Drug use: Never     Allergies  Allergen Reactions  . Penicillins Other (See Comments)    "lose my hearing"    Health Maintenance  Topic Date Due  . COVID-19 Vaccine (1) 05/22/2020 (Originally 04/20/1960)  . PNEUMOCOCCAL POLYSACCHARIDE VACCINE AGE 55-64 HIGH RISK  11/19/2020 (Originally 04/20/1957)  . HEMOGLOBIN A1C  07/05/2020  . INFLUENZA VACCINE  08/09/2020  . OPHTHALMOLOGY EXAM  09/16/2020  . FOOT EXAM  11/05/2020  . MAMMOGRAM  11/05/2020  . PAP SMEAR-Modifier  08/21/2021  . COLONOSCOPY (Pts 45-22yr Insurance coverage will need to be confirmed)  12/07/2025  . TETANUS/TDAP  08/21/2028  . Hepatitis C Screening  Completed  . HIV Screening  Completed  . HPV VACCINES  Aged Out    Chart Review Today: I personally reviewed active problem list, medication list, allergies, family history, social history, health maintenance, notes from last encounter, lab results, imaging with the patient/caregiver today.   Review of Systems  Constitutional: Negative.   HENT: Negative.   Eyes: Negative.   Respiratory: Negative.   Cardiovascular: Negative.   Gastrointestinal: Negative.   Endocrine: Negative.   Genitourinary: Negative.   Musculoskeletal: Negative.   Skin: Negative.   Allergic/Immunologic: Negative.   Neurological: Negative.   Hematological: Negative.   Psychiatric/Behavioral: Negative.   All other systems reviewed and are  negative.    Objective:   Vitals:   05/06/20 1551  BP: 122/64  Pulse: 95  Resp: 16  Temp: 98.2 F (36.8 C)  SpO2: 98%  Weight: 252 lb 3.2 oz (114.4 kg)  Height: 5' 2"  (1.575 m)    Body mass index is 46.13 kg/m.  Physical Exam Vitals and nursing note reviewed.  Constitutional:      General: She is not in acute distress.    Appearance: Normal appearance. She is well-developed. She is not ill-appearing, toxic-appearing or diaphoretic.     Interventions: Face mask in place.  HENT:     Head: Normocephalic and atraumatic.     Right Ear: External ear normal.     Left Ear: External ear normal.  Eyes:     General: Lids are normal. No scleral icterus.       Right eye: No discharge.  Left eye: No discharge.     Conjunctiva/sclera: Conjunctivae normal.  Neck:     Trachea: Phonation normal. No tracheal deviation.  Cardiovascular:     Rate and Rhythm: Normal rate and regular rhythm.     Pulses: Normal pulses.          Radial pulses are 2+ on the right side and 2+ on the left side.       Posterior tibial pulses are 2+ on the right side and 2+ on the left side.     Heart sounds: Normal heart sounds. No murmur heard. No friction rub. No gallop.   Pulmonary:     Effort: Pulmonary effort is normal. No respiratory distress.     Breath sounds: Normal breath sounds. No stridor. No wheezing, rhonchi or rales.  Chest:     Chest wall: No tenderness.  Abdominal:     General: Bowel sounds are normal. There is no distension.     Palpations: Abdomen is soft.  Musculoskeletal:     Right shoulder: Tenderness present. No swelling or bony tenderness. Decreased range of motion. Normal pulse.     Right lower leg: No edema.     Left lower leg: No edema.     Comments: Right shoulder - reduced internal rotation, positive drop test, reduced right shoulder flexion   Skin:    General: Skin is warm and dry.     Coloration: Skin is not jaundiced or pale.     Findings: No rash.  Neurological:      Mental Status: She is alert.     Motor: No abnormal muscle tone.     Gait: Gait normal.  Psychiatric:        Mood and Affect: Mood normal.        Speech: Speech normal.        Behavior: Behavior normal.         Assessment & Plan:     ICD-10-CM   1. Dyslipidemia  E78.5 rosuvastatin (CRESTOR) 5 MG tablet    Comprehensive metabolic panel    Lipid panel   On statin, no myalgias side effects or concerns, recheck of labs  2. Type 2 diabetes mellitus without complication, without long-term current use of insulin ([facility])  E11.9 Hemoglobin A1C    Comprehensive metabolic panel    metFORMIN (GLUCOPHAGE) 500 MG tablet   See below  3. Essential hypertension  I10 Comprehensive metabolic panel   Stable, well-controlled, blood pressure at goal today  4. New onset type 2 diabetes mellitus (H[facility])  E11.9 metFORMIN (GLUCOPHAGE) 500 MG tablet   Recheck of labs, tolerating meds, on statin, ACE, foot exam and eye exam done, excellent compliance with standard of care and monitoring  5. Class 3 severe obesity due to excess calories without serious comorbidity with body mass index (BMI) of 45.0 to 49.9 in adult [facility])  E66.01    Z68.42    Multiple comorbidities including new onset diabetes, hypertension, hyperlipidemia encouraged continued healthy diet and lifestyle efforts  6. Chronic right shoulder pain  M25.511 Ambulatory referral to Orthopedic Surgery   G89.29 meloxicam (MOBIC) 7.5 MG tablet   recurrent right shoulder pain, limited ROM with right shoulder flexion, pain waking her at night, worse for the past year, RHD  7. Encounter for medication monitoring  Z51.81 Hemoglobin A1C    Comprehensive metabolic panel    Lipid panel     Right shoulder pain-  reduced internal rotation, positive drop test, reduced right shoulder flexion   6 month  foutine f/up  Delsa Grana, PA-C 05/06/20 3:58 PM

## 2020-05-08 LAB — COMPREHENSIVE METABOLIC PANEL
ALT: 25 IU/L (ref 0–32)
AST: 17 IU/L (ref 0–40)
Albumin/Globulin Ratio: 2.2 (ref 1.2–2.2)
Albumin: 4.9 g/dL — ABNORMAL HIGH (ref 3.8–4.8)
Alkaline Phosphatase: 93 IU/L (ref 44–121)
BUN/Creatinine Ratio: 32 — ABNORMAL HIGH (ref 12–28)
BUN: 20 mg/dL (ref 8–27)
Bilirubin Total: 0.7 mg/dL (ref 0.0–1.2)
CO2: 22 mmol/L (ref 20–29)
Calcium: 9.9 mg/dL (ref 8.7–10.3)
Chloride: 104 mmol/L (ref 96–106)
Creatinine, Ser: 0.63 mg/dL (ref 0.57–1.00)
Globulin, Total: 2.2 g/dL (ref 1.5–4.5)
Glucose: 111 mg/dL — ABNORMAL HIGH (ref 65–99)
Potassium: 4.8 mmol/L (ref 3.5–5.2)
Sodium: 143 mmol/L (ref 134–144)
Total Protein: 7.1 g/dL (ref 6.0–8.5)
eGFR: 100 mL/min/{1.73_m2} (ref >59)

## 2020-05-08 LAB — LIPID PANEL
Chol/HDL Ratio: 2.6 ratio (ref 0.0–4.4)
Cholesterol, Total: 115 mg/dL (ref 100–199)
HDL: 44 mg/dL (ref >39)
LDL Chol Calc (NIH): 47 mg/dL (ref 0–99)
Triglycerides: 142 mg/dL (ref 0–149)
VLDL Cholesterol Cal: 24 mg/dL (ref 5–40)

## 2020-05-08 LAB — HEMOGLOBIN A1C
Est. average glucose Bld gHb Est-mCnc: 123 mg/dL
Hgb A1c MFr Bld: 5.9 % — ABNORMAL HIGH (ref 4.8–5.6)

## 2020-05-13 ENCOUNTER — Other Ambulatory Visit: Payer: Self-pay

## 2020-05-13 ENCOUNTER — Encounter: Payer: Self-pay | Admitting: Orthopaedic Surgery

## 2020-05-13 ENCOUNTER — Ambulatory Visit: Payer: Self-pay

## 2020-05-13 ENCOUNTER — Ambulatory Visit: Payer: Managed Care, Other (non HMO) | Admitting: Orthopaedic Surgery

## 2020-05-13 DIAGNOSIS — M25511 Pain in right shoulder: Secondary | ICD-10-CM | POA: Diagnosis not present

## 2020-05-13 DIAGNOSIS — G8929 Other chronic pain: Secondary | ICD-10-CM

## 2020-05-13 NOTE — Progress Notes (Signed)
Office Visit Note   Patient: Tina Stephenson           Date of Birth: 1955/03/08           MRN: [id number] Visit Date: 05/13/2020              Requested by: Tina Grana, PA-C [address] [place],  [place] [address] PCP: Tina Grana, PA-C   Assessment & Plan: Visit Diagnoses:  1. Chronic right shoulder pain     Plan: Based on findings we will need to get an MRI to assess for structural abnormalities as I am concerned she may have torn rotator cuff or a subluxed biceps tendon.  Follow-up after the MRI.  Follow-Up Instructions: Return if symptoms worsen or fail to improve.   Orders:  Orders Placed This Encounter  Procedures  . XR Shoulder Right  . MR SHOULDER RIGHT WO CONTRAST   No orders of the defined types were placed in this encounter.     Procedures: No procedures performed   Clinical Data: No additional findings.   Subjective: Chief Complaint  Patient presents with  . Right Shoulder - Pain    Tina Stephenson is a 65 year old female here for evaluation of chronic right shoulder pain for a year.  She states that she was doing work around the house when she felt a snap in her shoulder on the anterior portion and since then she has had increasing pain and difficulty with daily activities such as shampooing her hair and getting dressed and putting things up in the cabinet.  She has seen a chiropractor for this and she states that she has not felt any improvement from this.  She has been taking Tylenol which has not really helped.  She saw her PCP for this recently and meloxicam was prescribed.  So far she is has not noticed any relief.  She does have baseline cervical radiculopathy but she feels that this does not feel like it is related.   Review of Systems  Constitutional: Negative.   HENT: Negative.   Eyes: Negative.   Respiratory: Negative.   Cardiovascular: Negative.   Endocrine: Negative.   Musculoskeletal: Negative.   Neurological: Negative.    Hematological: Negative.   Psychiatric/Behavioral: Negative.   All other systems reviewed and are negative.    Objective: Vital Signs: There were no vitals taken for this visit.  Physical Exam Vitals and nursing note reviewed.  Constitutional:      Appearance: She is well-developed.  HENT:     Head: Normocephalic and atraumatic.  Pulmonary:     Effort: Pulmonary effort is normal.  Abdominal:     Palpations: Abdomen is soft.  Musculoskeletal:     Cervical back: Neck supple.  Skin:    General: Skin is warm.     Capillary Refill: Capillary refill takes less than 2 seconds.  Neurological:     Mental Status: She is alert and oriented to person, place, and time.  Psychiatric:        Behavior: Behavior normal.        Thought Content: Thought content normal.        Judgment: Judgment normal.     Ortho Exam Right shoulder shows moderate pain with flexion past 145 abduction past 100.  Testing of the cuff shows moderate pain and weakness with empty can and bearhug.  Mildly positive impingement signs.  AC joint is slightly tender to palpation.  Positive O'Brien.  Positive Speed. Specialty Comments:  No specialty comments  available.  Imaging: XR Shoulder Right  Result Date: 05/13/2020 Bulky changes of distal clavicle.  Moderate AC joint arthrosis.  Mild glenohumeral arthritis.  No acute abnormalities.    PMFS History: Patient Active Problem List   Diagnosis Date Noted  . Dyslipidemia 01/05/2020  . Cervical polyp 08/22/2018  . Nocturia 08/22/2018  . Intertrigo 08/22/2018  . Type 2 diabetes mellitus without complication, without long-term current use of insulin ([place]) 08/22/2018  . 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 History of migraine 03/25/2018  . Scoliosis 03/25/2018  . History of cyst of breast 03/25/2018  . Sacrum and coccyx fracture, sequela 02/21/2018  . Class 3 severe obesity due to excess calories without  serious comorbidity with body mass index (BMI) of 45.0 to 49.9 in adult ([place]) 02/21/2018  . Cough 02/21/2018  . Onychomycosis 02/21/2018  . History of hypertension 02/21/2018  . Vitamin D deficiency 03/19/2015  . Essential hypertension 03/05/2015   Past Medical History:  Diagnosis Date  . Bronchitis   . Depression   . Diabetes mellitus without complication ([place])   . Gallbladder polyp   . GERD (gastroesophageal reflux disease)   . Hypertension   . Hypertensive disorder 03/05/2015  . Liver tumor (benign)   . Sacrum and coccyx fracture ([place])   . Sciatica     Family History  Problem Relation Age of Onset  . COPD Mother   . Cancer Mother   . Heart disease Father   . Breast cancer Paternal Aunt   . Diabetes Brother   . Diabetes Maternal Grandfather   . Ovarian cancer Neg Hx   . Colon cancer Neg Hx     Past Surgical History:  Procedure Laterality Date  . APPENDECTOMY    . NASAL SEPTUM SURGERY    . WISDOM TOOTH EXTRACTION     Social History   Occupational History  . Not on file  Tobacco Use  . Smoking status: Never Smoker  . Smokeless tobacco: Never Used  Vaping Use  . Vaping Use: Never used  Substance and Sexual Activity  . Alcohol use: Never  . Drug use: Never  . Sexual activity: Not Currently    Birth control/protection: None

## 2020-05-19 ENCOUNTER — Telehealth: Payer: Self-pay

## 2020-05-19 NOTE — Telephone Encounter (Signed)
See below. Patient wishes to have MRI in [name]

## 2020-05-19 NOTE — Telephone Encounter (Signed)
patient called she is requesting a referral to be sent to [facility] , she would rather travel to [place] instead of  [place] call back:[phone]

## 2020-05-25 ENCOUNTER — Encounter: Payer: Self-pay | Admitting: Orthopaedic Surgery

## 2020-05-25 NOTE — Telephone Encounter (Signed)
Can you look into this please.  Thanks.

## 2020-05-28 ENCOUNTER — Encounter: Payer: Self-pay | Admitting: Family Medicine

## 2020-05-31 ENCOUNTER — Telehealth: Payer: Self-pay

## 2020-05-31 NOTE — Telephone Encounter (Signed)
Sent mychart msg.

## 2020-06-04 ENCOUNTER — Ambulatory Visit: Payer: Managed Care, Other (non HMO) | Admitting: Orthopaedic Surgery

## 2020-06-04 ENCOUNTER — Encounter: Payer: Self-pay | Admitting: Orthopaedic Surgery

## 2020-06-04 VITALS — Ht 62.0 in | Wt 252.0 lb

## 2020-06-04 DIAGNOSIS — G8929 Other chronic pain: Secondary | ICD-10-CM

## 2020-06-04 DIAGNOSIS — M25511 Pain in right shoulder: Secondary | ICD-10-CM

## 2020-06-04 MED ORDER — METHYLPREDNISOLONE ACETATE 40 MG/ML IJ SUSP
40.0000 mg | INTRAMUSCULAR | Status: AC | PRN
  Administered 2020-06-04: 40 mg via INTRA_ARTICULAR

## 2020-06-04 MED ORDER — BUPIVACAINE HCL 0.25 % IJ SOLN
2.0000 mL | INTRAMUSCULAR | Status: AC | PRN
  Administered 2020-06-04: 2 mL via INTRA_ARTICULAR

## 2020-06-04 MED ORDER — LIDOCAINE HCL 2 % IJ SOLN
2.0000 mL | INTRAMUSCULAR | Status: AC | PRN
  Administered 2020-06-04: 2 mL

## 2020-06-04 NOTE — Progress Notes (Signed)
Office Visit Note   Patient: Tina Stephenson           Date of Birth: 10/30/1955           MRN: [id number] Visit Date: 06/04/2020              Requested by: Delsa Grana, PA-C [address] [place],  [place] [address] PCP: Delsa Grana, PA-C   Assessment & Plan: Visit Diagnoses:  1. Chronic right shoulder pain     Plan: Impression is chronic right shoulder pain concerning for rotator cuff pathology.  At this point, she has not had relief following home exercise program or pain medication.  We will go ahead and perform a subacromial cortisone injection today and order an MRI of the right shoulder to assess her rotator cuff.  Follow-up with [place] once that has been completed.  Follow-Up Instructions: Return for after MRI.   Orders:  Orders Placed This Encounter  Procedures  . Large Joint Inj: R subacromial bursa  . MR SHOULDER RIGHT WO CONTRAST   No orders of the defined types were placed in this encounter.     Procedures: Large Joint Inj: R subacromial bursa on 06/04/2020 9:21 AM Indications: pain Details: 22 G needle Medications: 2 mL lidocaine 2 %; 2 mL bupivacaine 0.25 %; 40 mg methylPREDNISolone acetate 40 MG/ML Outcome: tolerated well, no immediate complications Patient was prepped and draped in the usual sterile fashion.       Clinical Data: No additional findings.   Subjective: Chief Complaint  Patient presents with  . 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  She was recently seen in our office for this.  She has been dealing with right shoulder pain and weakness for the past year which is progressively worsened.  The pain she has is primarily to the deltoid and is worse with any lifting of the shoulder or internal rotation activity such as snapping her bra.  She has been taking pain medication without any relief.  She has not previously had  a cortisone injection.  She has been doing over 6 weeks of a home exercise program without any relief.  Review of Systems as detailed in HPI.  All others reviewed and are negative.   Objective: Vital Signs: Ht 5\' 2"  (1.575 m)   Wt 252 lb (114.3 kg)   BMI 46.09 kg/m   Physical Exam well-developed well-nourished female no acute distress.  Alert and oriented x3.  Ortho Exam examination of the right shoulder reveals forward flexion to about 150 degrees.  Internal rotation to her back pocket.  External rotation to about 45 degrees.  Markedly positive empty can.  Negative drop arm.  4 out of 5 strength throughout.  She is neurovascular intact distally.  Specialty Comments:  No specialty comments available.  Imaging: No new imaging   PMFS History: Patient Active Problem List   Diagnosis Date Noted  . Dyslipidemia 01/05/2020  . Cervical polyp 08/22/2018  . Nocturia 08/22/2018  . Intertrigo 08/22/2018  . Type 2 diabetes mellitus without complication, without long-term current use of insulin ([place]) 08/22/2018  . 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 History of migraine 03/25/2018  . Scoliosis 03/25/2018  . History of cyst of breast 03/25/2018  . Sacrum and coccyx fracture, sequela 02/21/2018  . Class 3 severe obesity due to excess  calories without serious comorbidity with body mass index (BMI) of 45.0 to 49.9 in adult ([place]) 02/21/2018  . Cough 02/21/2018  . Onychomycosis 02/21/2018  . History of hypertension 02/21/2018  . Vitamin D deficiency 03/19/2015  . Essential hypertension 03/05/2015   Past Medical History:  Diagnosis Date  . Bronchitis   . Depression   . Diabetes mellitus without complication ([facility])   . Gallbladder polyp   . GERD (gastroesophageal reflux disease)   . Hypertension   . Hypertensive disorder 03/05/2015  . Liver tumor (benign)   . Sacrum and coccyx fracture ([place])   . Sciatica     Family History   Problem Relation Age of Onset  . COPD Mother   . Cancer Mother   . Heart disease Father   . Breast cancer Paternal Aunt   . Diabetes Brother   . Diabetes Maternal Grandfather   . Ovarian cancer Neg Hx   . Colon cancer Neg Hx     Past Surgical History:  Procedure Laterality Date  . APPENDECTOMY    . NASAL SEPTUM SURGERY    . WISDOM TOOTH EXTRACTION     Social History   Occupational History  . Not on file  Tobacco Use  . Smoking status: Never Smoker  . Smokeless tobacco: Never Used  Vaping Use  . Vaping Use: Never used  Substance and Sexual Activity  . Alcohol use: Never  . Drug use: Never  . Sexual activity: Not Currently    Birth control/protection: None

## 2020-07-25 ENCOUNTER — Encounter: Payer: Self-pay | Admitting: Family Medicine

## 2020-08-02 ENCOUNTER — Other Ambulatory Visit: Payer: Self-pay | Admitting: Family Medicine

## 2020-08-02 ENCOUNTER — Encounter: Payer: Self-pay | Admitting: Family Medicine

## 2020-08-02 ENCOUNTER — Ambulatory Visit: Payer: Managed Care, Other (non HMO) | Admitting: Family Medicine

## 2020-08-02 ENCOUNTER — Other Ambulatory Visit: Payer: Self-pay

## 2020-08-02 VITALS — BP 138/72 | HR 98 | Temp 98.4°F | Resp 16 | Ht 62.0 in | Wt 258.4 lb

## 2020-08-02 DIAGNOSIS — M7989 Other specified soft tissue disorders: Secondary | ICD-10-CM | POA: Insufficient documentation

## 2020-08-02 MED ORDER — FUROSEMIDE 20 MG PO TABS
20.0000 mg | ORAL_TABLET | Freq: Every day | ORAL | 0 refills | Status: DC
Start: 2020-08-02 — End: 2020-08-16

## 2020-08-02 NOTE — Assessment & Plan Note (Signed)
Subacute onset. Unclear etiology.  Bedside cardiac POCUS with overall preserved EF and symmetric wall motion, popliteal POCUS without evidence for DVT. Will obtain CMP, CBC, BNP to assess further. BP slightly elevated today, will start lasix. Recommend leg compression and elevation. F/u in 2-3 weeks.

## 2020-08-02 NOTE — Progress Notes (Signed)
   SUBJECTIVE:   CHIEF COMPLAINT / HPI:   LEG SWELLING  Having bilateral leg swelling for about a month. Location: L>R, lower legs Timing of swelling: none New medications: none  History of Kidney problems: no History of Heart problems: no History of Liver problems: no History of cancer: no PMH - HTN, DM2, HLD  Dad with h/o CHF.  Symptoms Chest pain: yes but also with h/o GERD Shortness of breath: no Immobility: no Black or bloody bowel movements: no Abdomen swelling: no History of blood clots: no Weight Loss: no Also with dizzy spells. Did fall once  Does not have compression stockings.  Previously had noticed some swelling before but this is worse.  No missed doses of BP meds.  No PND, orthopnea.  Occasional tightness with swelling but no pain.  Did fall onto knees a while back but denies other trauma.  Has difficulties raising legs at night as the head of her bed has to be raised due to GERD, she also has to lay on her side due to back pain.   OBJECTIVE:   BP 138/72   Pulse 98   Temp 98.4 F (36.9 C) (Oral)   Resp 16   Ht '5\' 2"'$  (1.575 m)   Wt 258 lb 6.4 oz (117.2 kg)   SpO2 99%   BMI 47.26 kg/m   Gen: overweight, well appearing, in NAD Card: RRR Lungs: CTAB, no crackles. Ext: WWP, 2+ pitting edema to knees  Bedside cardiac POCUS in parasternal long and 2 chamber apical views with general preserved EF and symmetric wall motion. POCUS of popliteal veins with compressibility, no clots visible.   ASSESSMENT/PLAN:   Leg swelling Subacute onset. Unclear etiology.  Bedside cardiac POCUS with overall preserved EF and symmetric wall motion, popliteal POCUS without evidence for DVT. Will obtain CMP, CBC, BNP to assess further. BP slightly elevated today, will start lasix. Recommend leg compression and elevation. F/u in 2-3 weeks.      Myles Gip, DO

## 2020-08-02 NOTE — Patient Instructions (Signed)
It was great to see you!  Our plans for today:  - We are starting a medication to help with the swelling and your blood pressure.  - Get compression stockings from either your pharmacy or [facility].  We are checking some labs today, we will release these results to your MyChart.  Take care and seek immediate care sooner if you develop any concerns.   Dr. Ky Barban

## 2020-08-02 NOTE — Telephone Encounter (Signed)
filled 08/02/20

## 2020-08-03 ENCOUNTER — Encounter: Payer: Self-pay | Admitting: Orthopaedic Surgery

## 2020-08-03 LAB — COMPREHENSIVE METABOLIC PANEL
ALT: 27 IU/L (ref 0–32)
AST: 22 IU/L (ref 0–40)
Albumin/Globulin Ratio: 2.1 (ref 1.2–2.2)
Albumin: 4.6 g/dL (ref 3.8–4.8)
Alkaline Phosphatase: 99 IU/L (ref 44–121)
BUN/Creatinine Ratio: 31 — ABNORMAL HIGH (ref 12–28)
BUN: 19 mg/dL (ref 8–27)
Bilirubin Total: 1.1 mg/dL (ref 0.0–1.2)
CO2: 23 mmol/L (ref 20–29)
Calcium: 9.6 mg/dL (ref 8.7–10.3)
Chloride: 103 mmol/L (ref 96–106)
Creatinine, Ser: 0.62 mg/dL (ref 0.57–1.00)
Globulin, Total: 2.2 g/dL (ref 1.5–4.5)
Glucose: 92 mg/dL (ref 65–99)
Potassium: 4.4 mmol/L (ref 3.5–5.2)
Sodium: 141 mmol/L (ref 134–144)
Total Protein: 6.8 g/dL (ref 6.0–8.5)
eGFR: 100 mL/min/{1.73_m2} (ref >59)

## 2020-08-03 LAB — CBC
Hematocrit: 38.5 % (ref 34.0–46.6)
Hemoglobin: 12.5 g/dL (ref 11.1–15.9)
MCH: 28.9 pg (ref 26.6–33.0)
MCHC: 32.5 g/dL (ref 31.5–35.7)
MCV: 89 fL (ref 79–97)
Platelets: 321 10*3/uL (ref 150–450)
RBC: 4.32 x10E6/uL (ref 3.77–5.28)
RDW: 12.3 % (ref 11.7–15.4)
WBC: 9 10*3/uL (ref 3.4–10.8)

## 2020-08-03 LAB — BRAIN NATRIURETIC PEPTIDE: BNP: 23.6 pg/mL (ref 0.0–100.0)

## 2020-08-04 NOTE — Telephone Encounter (Signed)
Please resubmit order for MRI now that she has done enough conservative treatment without pain relief.

## 2020-08-05 ENCOUNTER — Other Ambulatory Visit: Payer: Self-pay

## 2020-08-05 DIAGNOSIS — G8929 Other chronic pain: Secondary | ICD-10-CM

## 2020-08-05 NOTE — Telephone Encounter (Signed)
yes

## 2020-08-06 ENCOUNTER — Other Ambulatory Visit: Payer: Self-pay

## 2020-08-06 DIAGNOSIS — G8929 Other chronic pain: Secondary | ICD-10-CM

## 2020-08-06 DIAGNOSIS — M25512 Pain in left shoulder: Secondary | ICD-10-CM

## 2020-08-10 ENCOUNTER — Encounter: Payer: Self-pay | Admitting: *Deleted

## 2020-08-16 ENCOUNTER — Encounter: Payer: Self-pay | Admitting: Family Medicine

## 2020-08-16 ENCOUNTER — Ambulatory Visit: Payer: Managed Care, Other (non HMO) | Admitting: Family Medicine

## 2020-08-16 ENCOUNTER — Other Ambulatory Visit: Payer: Self-pay

## 2020-08-16 VITALS — BP 130/80 | HR 80 | Temp 98.7°F | Resp 16 | Ht 62.0 in | Wt 257.1 lb

## 2020-08-16 DIAGNOSIS — M7989 Other specified soft tissue disorders: Secondary | ICD-10-CM | POA: Diagnosis not present

## 2020-08-16 DIAGNOSIS — I10 Essential (primary) hypertension: Secondary | ICD-10-CM | POA: Diagnosis not present

## 2020-08-16 MED ORDER — HYDROCHLOROTHIAZIDE 12.5 MG PO TABS: 12.5000 mg | ORAL_TABLET | Freq: Every day | ORAL | 0 refills | Status: DC

## 2020-08-16 NOTE — Assessment & Plan Note (Signed)
Slightly above goal today. Will change lasix to HCTZ. F/u in 1 month.

## 2020-08-16 NOTE — Assessment & Plan Note (Addendum)
Prior cardiac, renal workup reassuring. No signs on exam today to concern for DVT given bilateral, no erythema, assymmetry, negative homans. Likely 2/2 venous stasis given history/exam, recommend compression, elevation, frequent walking. Consider referral for compression stocking fitting if unable to tolerate generic stockings.

## 2020-08-16 NOTE — Patient Instructions (Signed)
It was great to see you!  Our plans for today:  - Elevate your legs whenever sitting or lying. - Wear compression stockings as much as able to tolerate. - Take the hydrochlorothiazide daily.  - come back in 1 month.   Take care and seek immediate care sooner if you develop any concerns.   Dr. Ky Barban

## 2020-08-16 NOTE — Progress Notes (Signed)
    SUBJECTIVE:   CHIEF COMPLAINT / HPI:   Swelling - seen 7/25 for b/l leg swelling, L>R, x1 month. CMP, CBC, BNP wnl. Bedside POCUS without notable abnormalities. BP slightly elevated at that time, started lasix '20mg'$ .  - still with pain and swelling. Pain in R calf started early this morning. Constant, getting worse. No redness.  - thinks lasix helped some. - hasn't checked BP at home.  - no chest pain.  - some harder time breathing with walking. Not new - not sure if compression stockings help, hasn't used very much, hard to get on. - hasn't been elevating legs very much.  OBJECTIVE:   BP 130/80   Pulse 80   Temp 98.7 F (37.1 C) (Oral)   Resp 16   Ht '5\' 2"'$  (1.575 m)   Wt 257 lb 1.6 oz (116.6 kg)   SpO2 97%   BMI 47.02 kg/m   Gen: overweight, in NAD Card: RRR Lungs: CTAB Ext: WWP, 2+ pitting edema to shins b/l. Negative Homan's.   ASSESSMENT/PLAN:   Leg swelling Prior cardiac, renal workup reassuring. No signs on exam today to concern for DVT given bilateral, no erythema, assymmetry, negative homans. Likely 2/2 venous stasis given history/exam, recommend compression, elevation, frequent walking. Consider referral for compression stocking fitting if unable to tolerate generic stockings.   Essential hypertension Slightly above goal today. Will change lasix to HCTZ. F/u in 1 month.    Myles Gip, DO

## 2020-08-19 ENCOUNTER — Ambulatory Visit: Admission: RE | Admit: 2020-08-19 | Payer: Managed Care, Other (non HMO) | Source: Ambulatory Visit

## 2020-08-21 ENCOUNTER — Ambulatory Visit: Payer: Managed Care, Other (non HMO)

## 2020-08-29 ENCOUNTER — Other Ambulatory Visit: Payer: Self-pay | Admitting: Family Medicine

## 2020-08-29 NOTE — Telephone Encounter (Signed)
dc'd 08/16/20 Dr. Ky Barban

## 2020-08-31 ENCOUNTER — Ambulatory Visit (INDEPENDENT_AMBULATORY_CARE_PROVIDER_SITE_OTHER): Payer: Managed Care, Other (non HMO)

## 2020-08-31 ENCOUNTER — Encounter: Payer: Self-pay | Admitting: Orthopaedic Surgery

## 2020-08-31 ENCOUNTER — Ambulatory Visit: Payer: Managed Care, Other (non HMO) | Admitting: Orthopaedic Surgery

## 2020-08-31 ENCOUNTER — Other Ambulatory Visit: Payer: Self-pay

## 2020-08-31 DIAGNOSIS — M25512 Pain in left shoulder: Secondary | ICD-10-CM | POA: Diagnosis not present

## 2020-08-31 DIAGNOSIS — G8929 Other chronic pain: Secondary | ICD-10-CM | POA: Diagnosis not present

## 2020-08-31 MED ORDER — METHYLPREDNISOLONE ACETATE 40 MG/ML IJ SUSP
40.0000 mg | INTRAMUSCULAR | Status: AC | PRN
  Administered 2020-08-31: 40 mg via INTRA_ARTICULAR

## 2020-08-31 MED ORDER — BUPIVACAINE HCL 0.25 % IJ SOLN
2.0000 mL | INTRAMUSCULAR | Status: AC | PRN
  Administered 2020-08-31: 2 mL via INTRA_ARTICULAR

## 2020-08-31 MED ORDER — LIDOCAINE HCL 2 % IJ SOLN
2.0000 mL | INTRAMUSCULAR | Status: AC | PRN
  Administered 2020-08-31: 2 mL

## 2020-08-31 NOTE — Progress Notes (Signed)
Office Visit Note   Patient: Tina Stephenson           Date of Birth: May 20, 1955           MRN: [id number] Visit Date: 08/31/2020              Requested by: Delsa Grana, PA-C [address] [place],  [place] [address] PCP: Delsa Grana, PA-C   Assessment & Plan: Visit Diagnoses:  1. Chronic left shoulder pain     Plan: Impression is chronic left shoulder pain concerning for rotator cuff pathology.  She has had very similar symptoms to the right which minimally improved following subacromial cortisone injection.  She would like to go ahead and proceed with subacromial cortisone injection on the left but we will also order an MRI to assess for structural abnormalities.  Follow-up with [place] once that has been completed.  Follow-Up Instructions: Return for after MRI.   Orders:  Orders Placed This Encounter  Procedures   Large Joint Inj   XR Shoulder Left   No orders of the defined types were placed in this encounter.     Procedures: Large Joint Inj: L subacromial bursa on 08/31/2020 4:07 PM Indications: pain Details: 22 G needle Medications: 2 mL lidocaine 2 %; 2 mL bupivacaine 0.25 %; 40 mg methylPREDNISolone acetate 40 MG/ML Outcome: tolerated well, no immediate complications Patient was prepped and draped in the usual sterile fashion.      Clinical Data: No additional findings.   Subjective: Chief Complaint  Patient presents with   Left Shoulder - Pain, Follow-up    HPI patient is a pleasant 65 year old female who comes in today with chronic left shoulder pain.  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  It is progressively worsened.  The pain is primarily to the anterior shoulder and into the proximal deltoid.  She notes mild weakness.  Pain is worse when she is turning the steering wheel as well as when she forward flexes her shoulder.  She has been taking Tylenol without relief.  She has not previously had an  injection to the left shoulder.  She has however been working on a home exercise program for many months without relief.  Review of Systems as detailed in HPI.  All others reviewed and are negative.   Objective: Vital Signs: There were no vitals taken for this visit.  Physical Exam well-developed well-nourished female no acute distress.  Alert and oriented x3.  Ortho Exam left shoulder exam reveals forward flexion abduction to approximately 160 degrees.  Positive empty can test.  Negative speeds negative O'Brien's.  Near full strength throughout.  She can internally rotate to L5.  She is neurovascular intact distally.  Specialty Comments:  No specialty comments available.  Imaging: XR Shoulder Left  Result Date: 08/31/2020 Type III acromion.  Otherwise, no acute or structural abnormalities    PMFS History: Patient Active Problem List   Diagnosis Date Noted   Leg swelling 08/02/2020   Dyslipidemia 01/05/2020   Cervical polyp 08/22/2018   Nocturia 08/22/2018   Intertrigo 08/22/2018   Type 2 diabetes mellitus without complication, without long-term current use of insulin ([facility]) 08/22/2018   Cervical spine arthritis with nerve pain 06/25/2018   Herniated disc, cervical 06/25/2018   History of migraine 03/25/2018   Scoliosis 03/25/2018   History of cyst of breast 03/25/2018   Sacrum and coccyx fracture, sequela 02/21/2018   Class 3 severe obesity due  to excess calories without serious comorbidity with body mass index (BMI) of 45.0 to 49.9 in adult [facility]) 02/21/2018   Cough 02/21/2018   Onychomycosis 02/21/2018   History of hypertension 02/21/2018   Vitamin D deficiency 03/19/2015   Essential hypertension 03/05/2015   Past Medical History:  Diagnosis Date   Bronchitis    Depression    Diabetes mellitus without complication ([facility])    Gallbladder polyp    GERD (gastroesophageal reflux disease)    Hypertension    Hypertensive disorder 03/05/2015   Liver tumor (benign)     Sacrum and coccyx fracture ([facility])    Sciatica     Family History  Problem Relation Age of Onset   COPD Mother    Cancer Mother    Heart disease Father    Breast cancer Paternal Aunt    Diabetes Brother    Diabetes Maternal Grandfather    Ovarian cancer Neg Hx    Colon cancer Neg Hx     Past Surgical History:  Procedure Laterality Date   APPENDECTOMY     NASAL SEPTUM SURGERY     WISDOM TOOTH EXTRACTION     Social History   Occupational History   Not on file  Tobacco Use   Smoking status: Never   Smokeless tobacco: Never  Vaping Use   Vaping Use: Never used  Substance and Sexual Activity   Alcohol use: Never   Drug use: Never   Sexual activity: Not Currently    Birth control/protection: None

## 2020-09-02 ENCOUNTER — Encounter: Payer: Self-pay | Admitting: Family Medicine

## 2020-09-11 ENCOUNTER — Ambulatory Visit
Admission: RE | Admit: 2020-09-11 | Discharge: 2020-09-11 | Disposition: A | Discharge status: Home / Self Care | Payer: Managed Care, Other (non HMO) | Source: Ambulatory Visit | Attending: Orthopaedic Surgery | Admitting: Orthopaedic Surgery

## 2020-09-11 ENCOUNTER — Other Ambulatory Visit: Payer: Self-pay

## 2020-09-11 DIAGNOSIS — M25512 Pain in left shoulder: Secondary | ICD-10-CM | POA: Diagnosis not present

## 2020-09-11 DIAGNOSIS — M25511 Pain in right shoulder: Secondary | ICD-10-CM | POA: Insufficient documentation

## 2020-09-11 DIAGNOSIS — G8929 Other chronic pain: Secondary | ICD-10-CM | POA: Diagnosis present

## 2020-09-11 IMAGING — MR MR SHOULDER*L* W/O CM
6 of 7 series · 33 of 40 positions shown · non-contrast
Comparison: X-ray [DATE]

CLINICAL DATA: Shoulder pain, chronic, osteoarthritis suspected

EXAM:
MRI OF THE LEFT SHOULDER WITHOUT CONTRAST
TECHNIQUE: Multiplanar, multisequence MR imaging of the shoulder was performed.
No intravenous contrast was administered.

[Series 18: T2 fat-sat · oblique · left · 4.0mm · 0.22mm/px · 5 of 22 slices shown (1 of 3)]
[im 1/22]
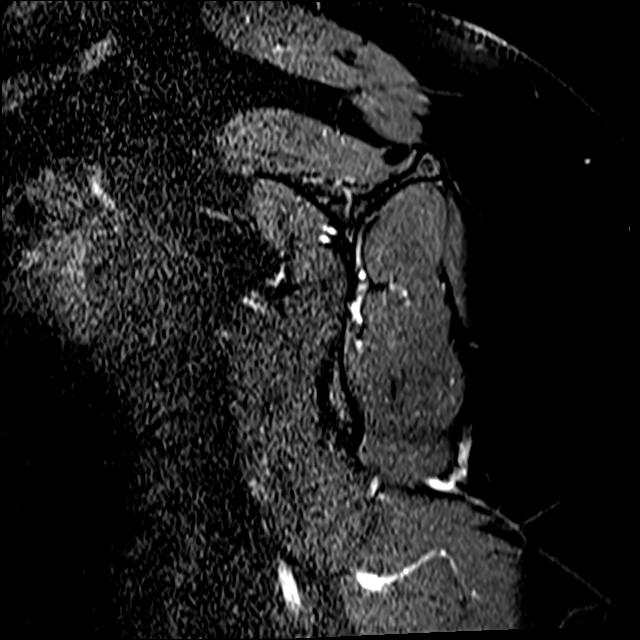
[im 6/22]
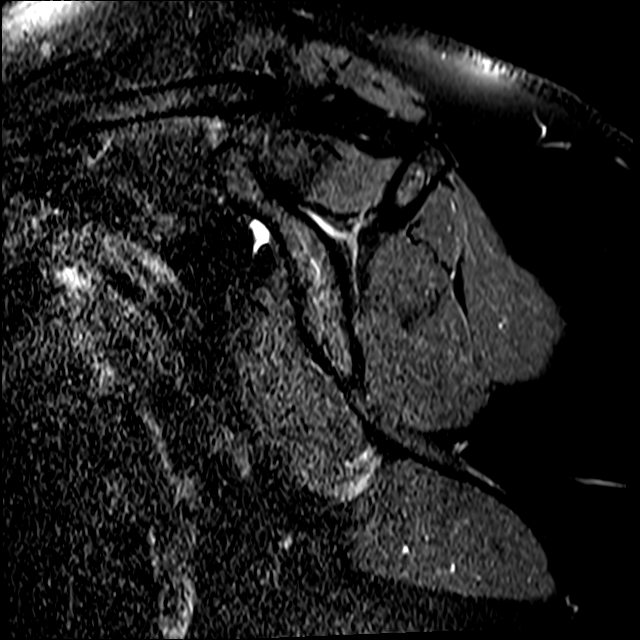
[im 11/22]
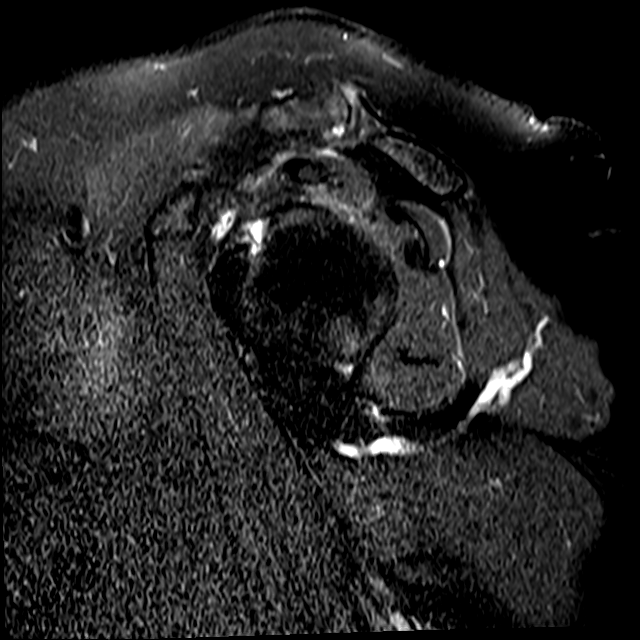
[im 16/22]
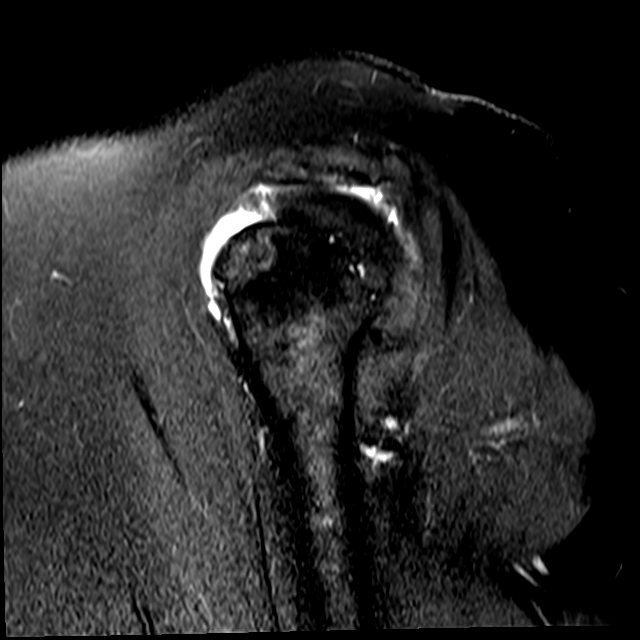
[im 22/22]
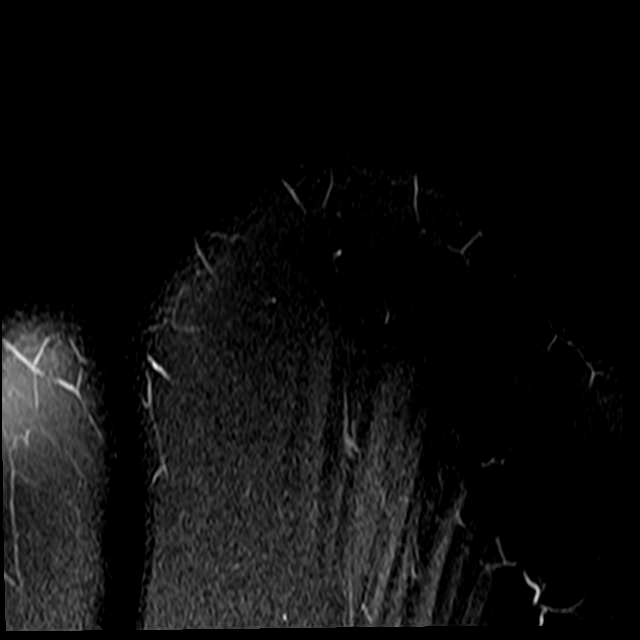

[Series 19: T1 · oblique · left · 4.0mm · 0.44mm/px · 4 of 22 slices shown]
[im 1/22]
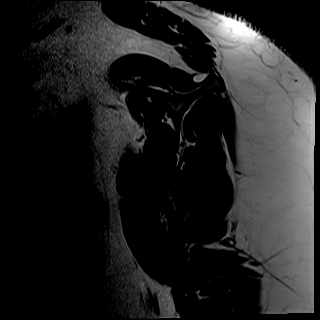
[im 6/22]
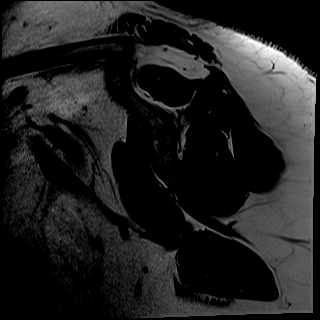
[im 11/22]
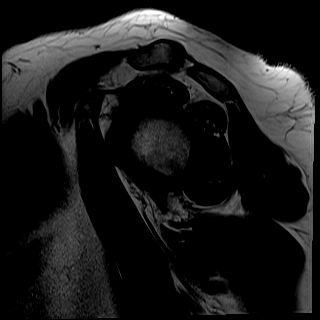
[im 16/22]
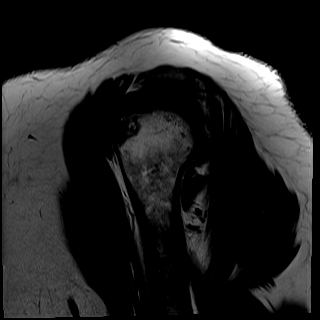

[Series 1036: PD · oblique · left · 4.0mm · 0.44mm/px · 6 of 26 slices shown (1 of 2)]
[im 1/26]
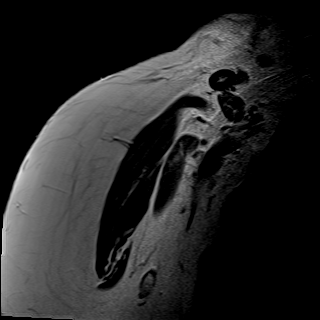
[im 6/26]
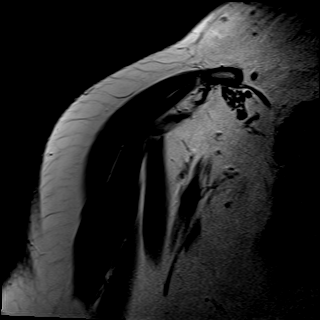
[im 11/26]
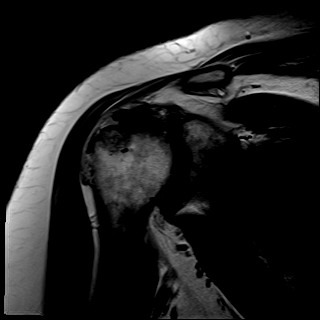
[im 16/26]
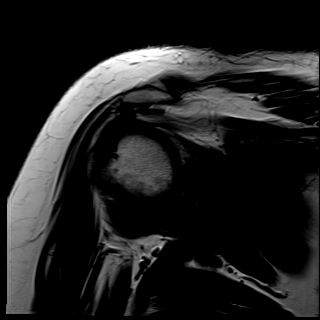
[im 21/26]
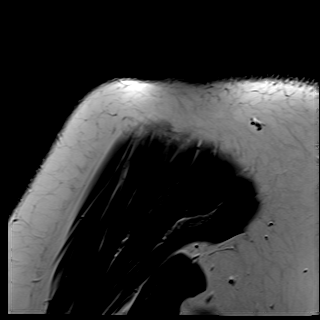
[im 26/26]
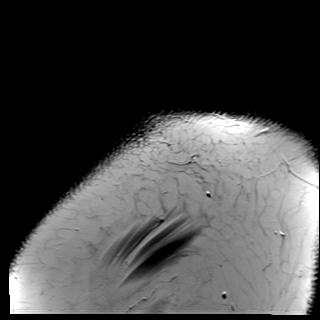

[Series 1127: PD · oblique · left · 4.0mm · 0.44mm/px · 6 of 26 slices shown (2 of 2)]
[im 1/26]
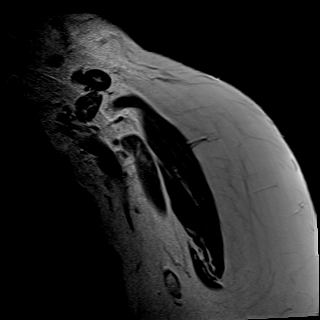
[im 6/26]
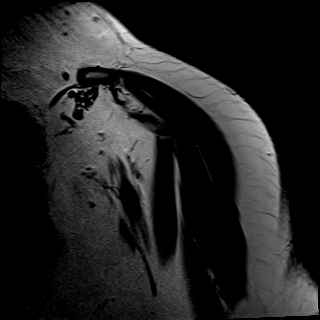
[im 11/26]
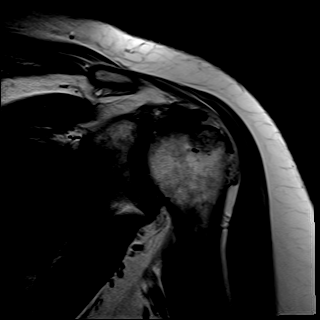
[im 16/26]
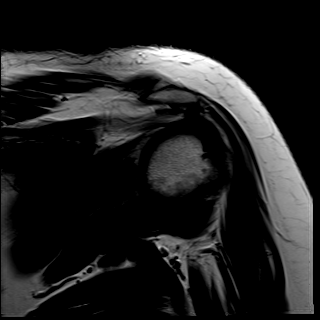
[im 21/26]
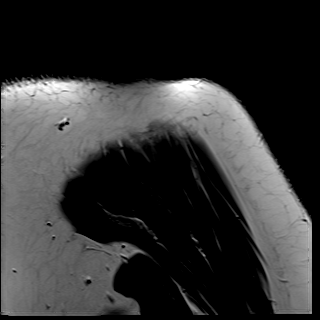
[im 26/26]
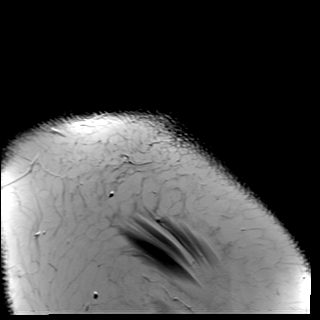

[Series 1134: T2 fat-sat · oblique · left · 4.0mm · 0.44mm/px · 6 of 26 slices shown (2 of 3)]
[im 1/26]
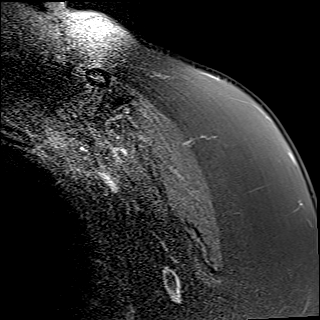
[im 6/26]
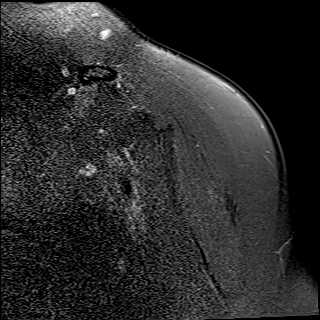
[im 11/26]
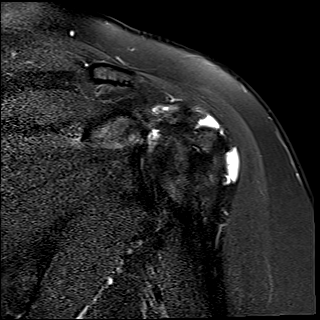
[im 16/26]
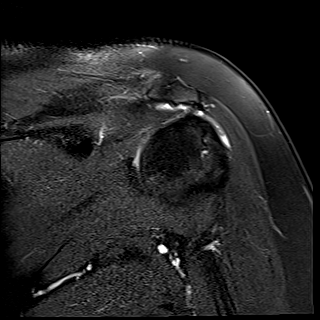
[im 21/26]
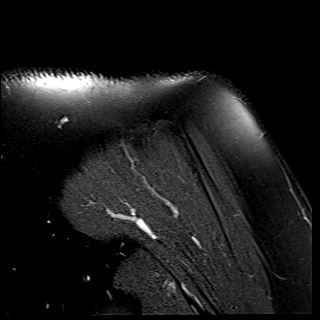
[im 26/26]
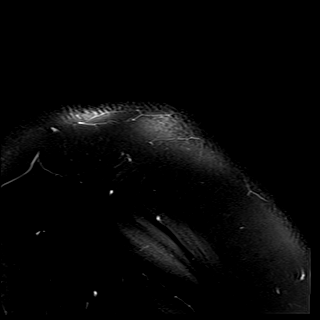

[Series 1141: T2 fat-sat · axial · left · 4.0mm · 0.44mm/px · z∈[-88,+29]mm · 6 of 26 slices shown (3 of 3)]
[im 1/26]
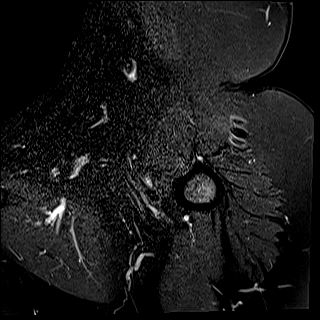
[im 6/26]
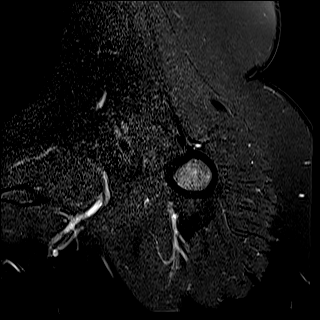
[im 11/26]
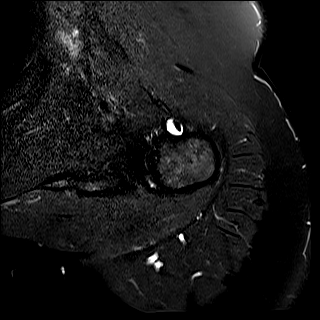
[im 16/26]
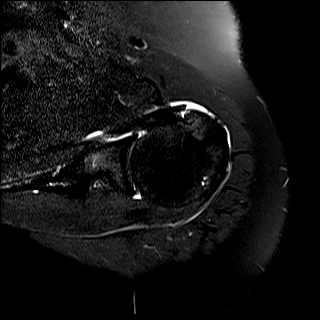
[im 21/26]
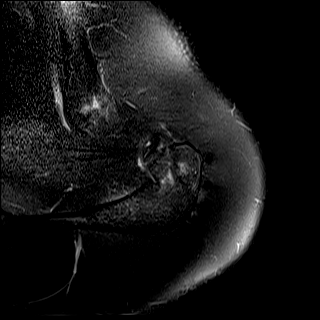
[im 26/26]
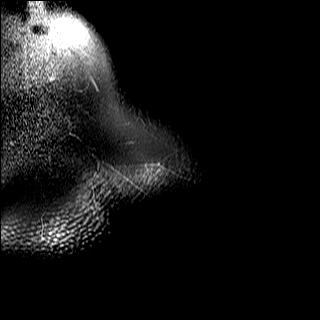

[33 of 40 positions shown; findings below may reference images not displayed]

FINDINGS: Rotator cuff: Full-thickness incomplete tear involving the anterior
supraspinatus tendon insertion. Tear measures approximately 10 mm in
AP dimension and is mildly retracted up to 5 mm. Infraspinatus,
subscapularis, and teres minor tendons are intact.

Muscles: Preserved bulk and signal intensity of the rotator cuff
musculature without edema, atrophy, or fatty infiltration.

Biceps long head:  Grossly intact.

Acromioclavicular Joint: Mild arthropathy of the AC joint.
Small-moderate volume subacromial-subdeltoid bursal fluid with mild
internal complexity.

Glenohumeral Joint: Mild diffuse chondral thinning. No joint
effusion.

Labrum: Grossly intact, but evaluation is limited by lack of
intraarticular fluid.

Bones:  No marrow abnormality, fracture or dislocation.

Other: None.
IMPRESSION: Left shoulder:

1. Full-thickness mildly retracted tear involving the anterior
supraspinatus tendon insertion.
2. Small-moderate volume subacromial-subdeltoid bursitis.
3. Mild glenohumeral and AC joint osteoarthritis.

## 2020-09-11 IMAGING — MR MR SHOULDER*R* W/O CM
4 of 5 series · 31 of 40 positions shown · non-contrast
Comparison: X-ray [DATE]

CLINICAL DATA: Shoulder pain, chronic, osteoarthritis suspected

EXAM:
MRI OF THE RIGHT SHOULDER WITHOUT CONTRAST
TECHNIQUE: Multiplanar, multisequence MR imaging of the shoulder was performed.
No intravenous contrast was administered.

[Series 5: T2 fat-sat · axial · right · 4.0mm · 0.44mm/px · z∈[+21,+135]mm · 8 of 26 slices shown (1 of 3)]
[im 1/26]
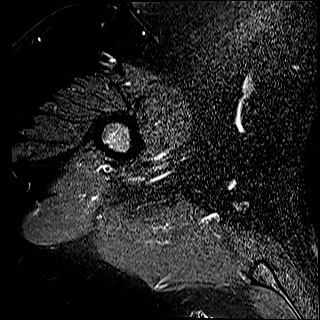
[im 4/26]
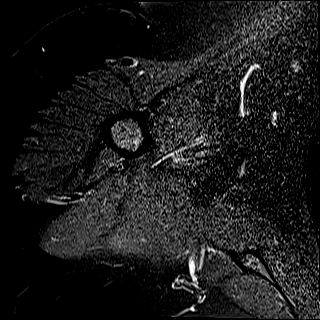
[im 8/26]
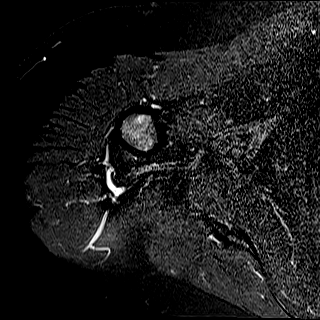
[im 11/26]
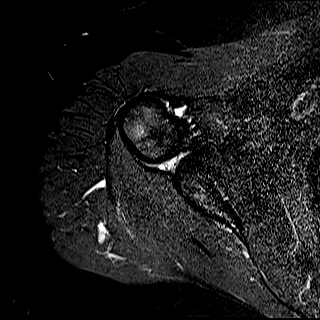
[im 15/26]
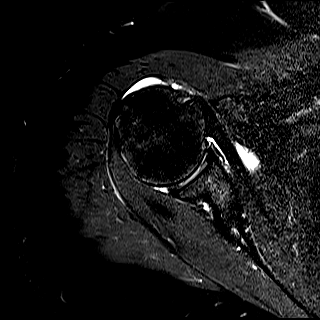
[im 18/26]
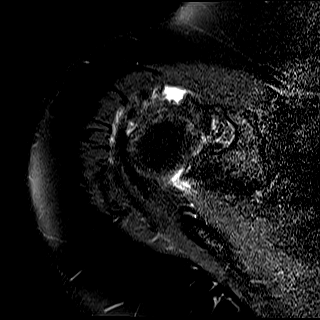
[im 22/26]
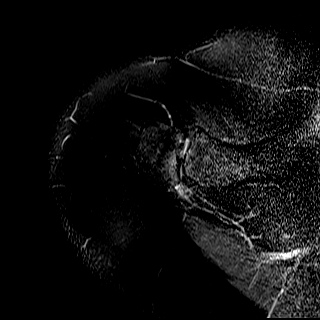
[im 26/26]
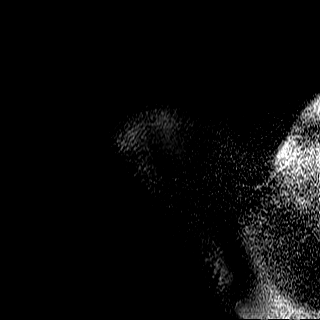

[Series 6: PD · oblique · right · 4.0mm · 0.44mm/px · 9 of 26 slices shown]
[im 1/26]
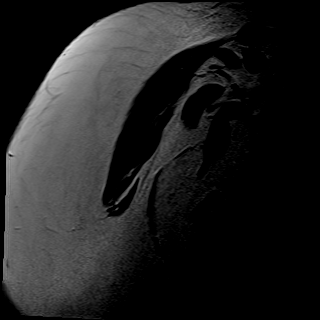
[im 4/26]
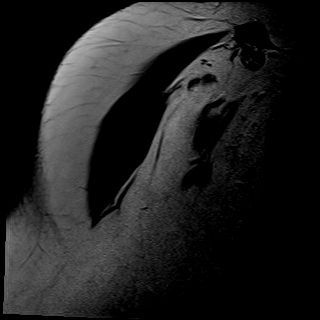
[im 7/26]
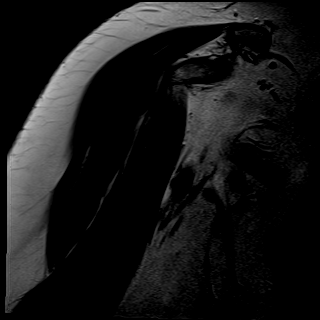
[im 10/26]
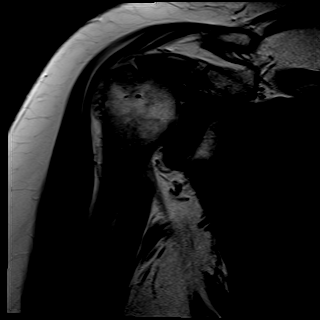
[im 13/26]
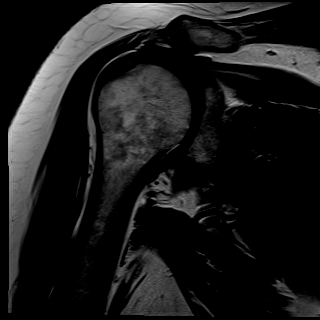
[im 16/26]
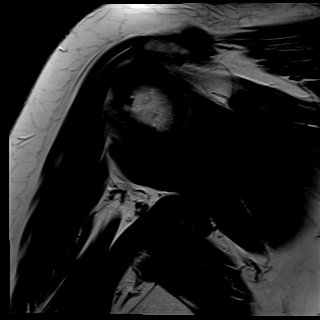
[im 19/26]
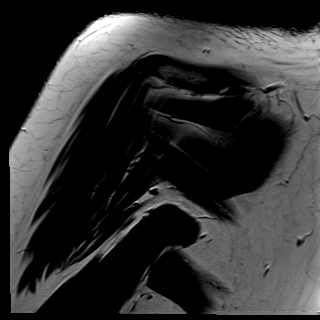
[im 22/26]
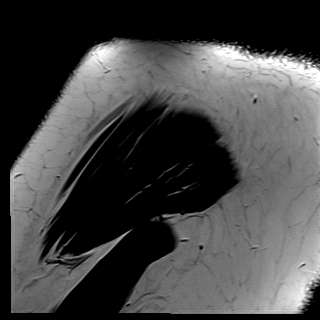
[im 26/26]
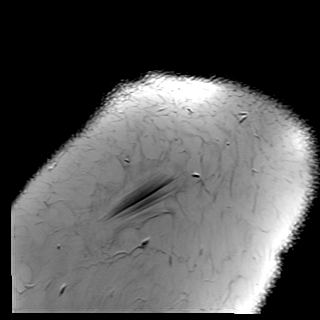

[Series 7: T2 fat-sat · oblique · right · 4.0mm · 0.44mm/px · 9 of 26 slices shown (2 of 3)]
[im 1/26]
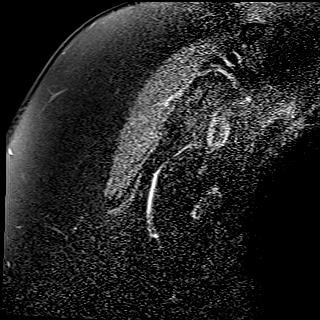
[im 4/26]
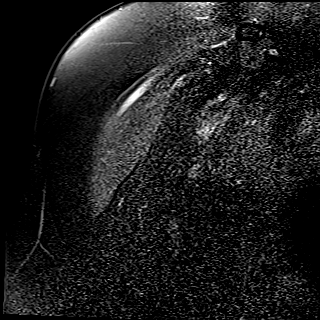
[im 7/26]
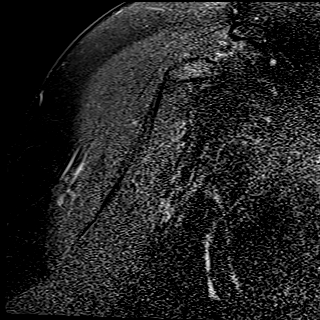
[im 10/26]
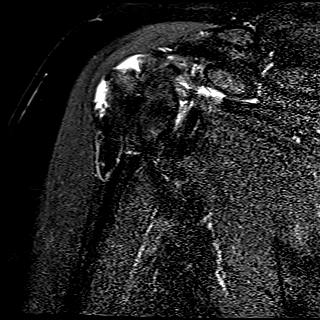
[im 13/26]
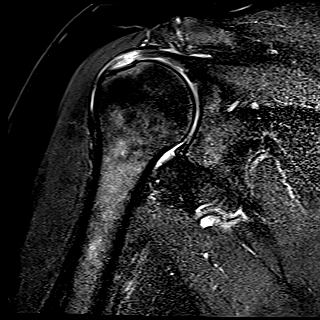
[im 16/26]
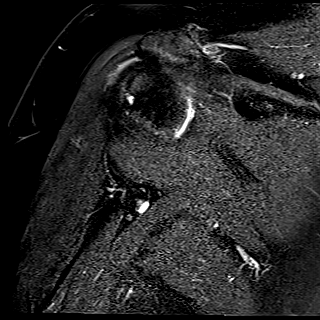
[im 19/26]
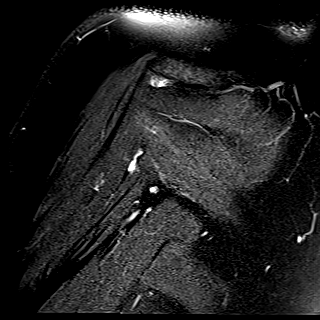
[im 22/26]
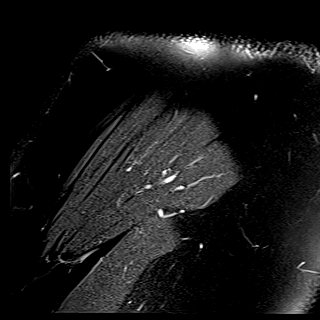
[im 26/26]
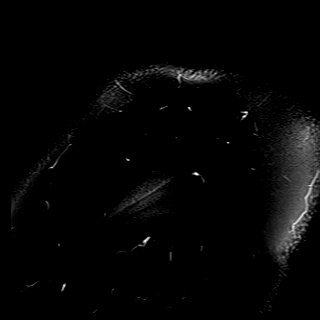

[Series 8: T2 fat-sat · oblique · right · 4.0mm · 0.22mm/px · 5 of 22 slices shown (3 of 3)]
[im 1/22]
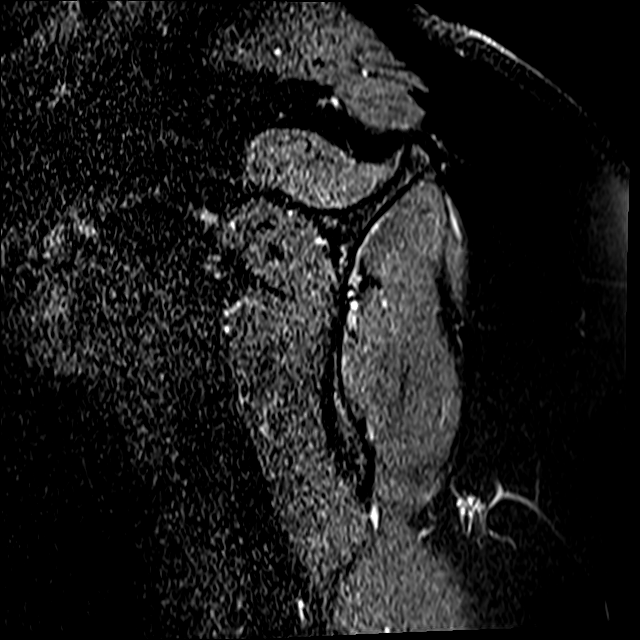
[im 4/22]
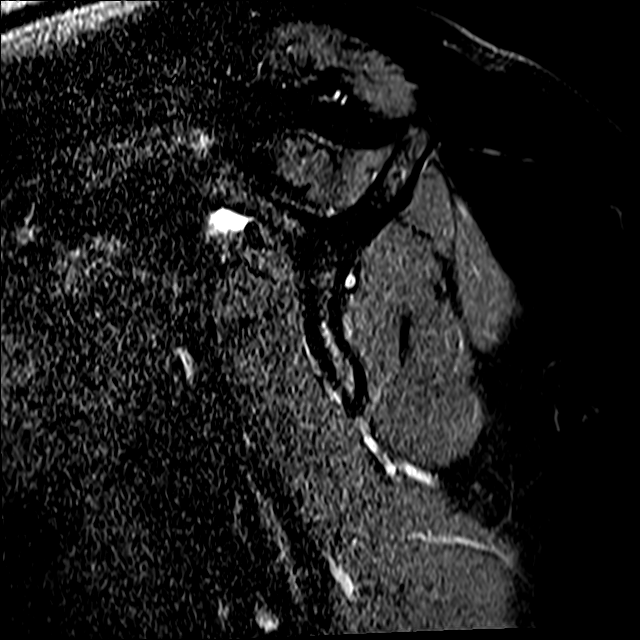
[im 8/22]
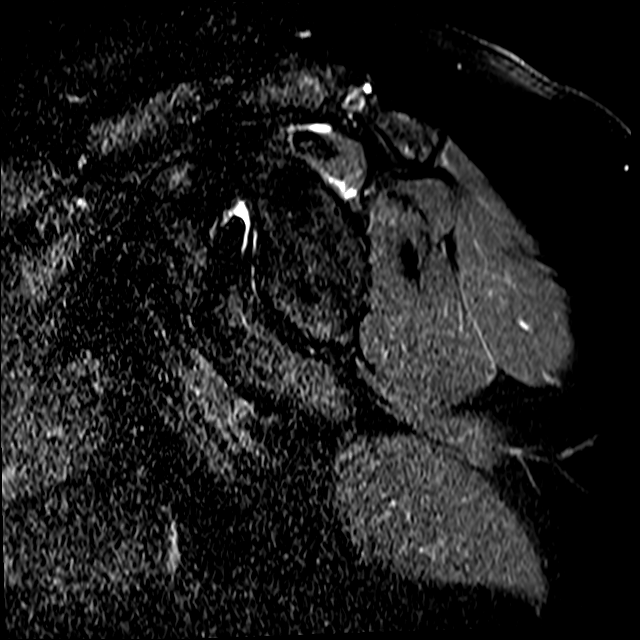
[im 11/22]
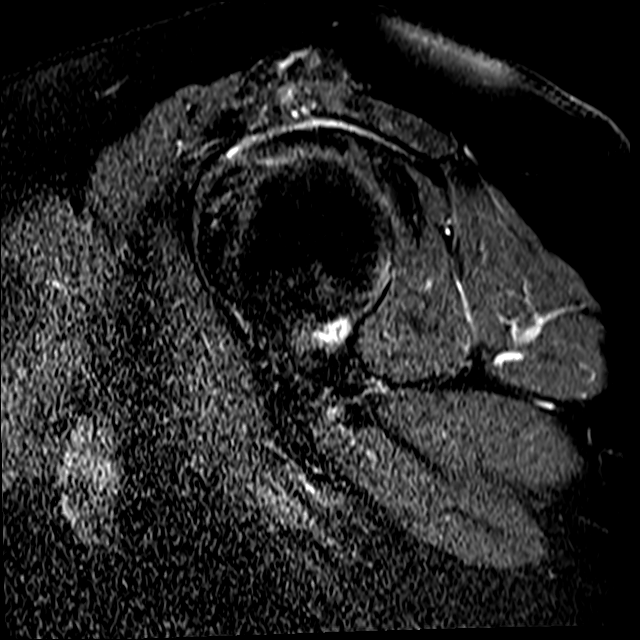
[im 18/22]
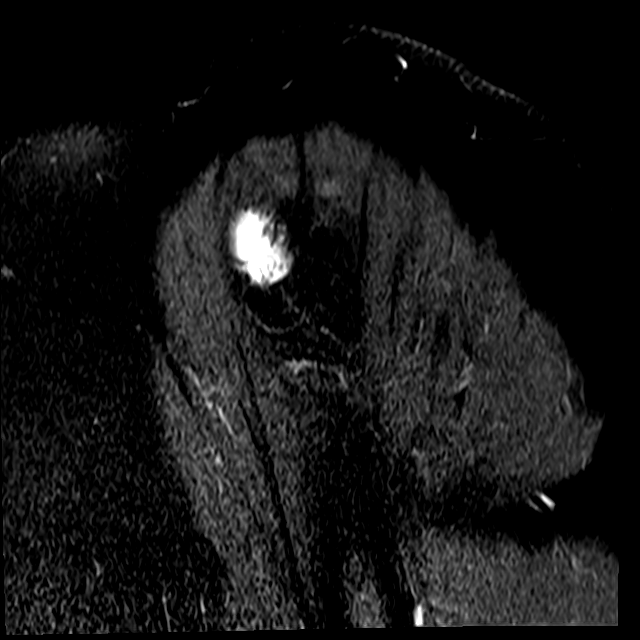

[31 of 40 positions shown; findings below may reference images not displayed]

FINDINGS: Rotator cuff: Full-thickness near-complete tear of the distal
supraspinatus tendon with up to 8 mm of tendon retraction. Tear
measures approximately 13 mm in AP dimension. The far posterior
portion of the supraspinatus tendon at the interdigitation remains
intact. Infraspinatus, subscapularis, and teres minor tendons
intact.

Muscles: Preserved bulk and signal intensity of the rotator cuff
musculature without edema, atrophy, or fatty infiltration.

Biceps long head: Intra-articular biceps tendinosis with suspected
partial tear (series 8, images 10-11).

Acromioclavicular Joint: Mild-moderate arthropathy of the AC joint.
Small-moderate volume subacromial-subdeltoid bursal fluid.

Glenohumeral Joint: Mild diffuse chondral thinning. No joint
effusion.

Labrum: Grossly intact, but evaluation is limited by lack of
intraarticular fluid.

Bones:  No marrow abnormality, fracture or dislocation.

Other: None.
IMPRESSION: Right shoulder:

1. Full-thickness near-complete tear of the distal supraspinatus
tendon with up to 8 mm of tendon retraction.
2. Intra-articular biceps tendinosis with suspected partial tear.
3. Mild-moderate subacromial-subdeltoid bursitis.
4. Mild glenohumeral and mild-moderate AC joint osteoarthritis.

## 2020-09-14 ENCOUNTER — Ambulatory Visit: Payer: Managed Care, Other (non HMO) | Admitting: Orthopaedic Surgery

## 2020-09-14 ENCOUNTER — Ambulatory Visit: Payer: Managed Care, Other (non HMO) | Admitting: Family Medicine

## 2020-09-14 DIAGNOSIS — E785 Hyperlipidemia, unspecified: Secondary | ICD-10-CM

## 2020-09-14 DIAGNOSIS — I10 Essential (primary) hypertension: Secondary | ICD-10-CM

## 2020-09-14 DIAGNOSIS — M7989 Other specified soft tissue disorders: Secondary | ICD-10-CM

## 2020-09-14 DIAGNOSIS — E119 Type 2 diabetes mellitus without complications: Secondary | ICD-10-CM

## 2020-09-14 NOTE — Progress Notes (Signed)
Needs appt with [place].  Not sure why she has appt with Lurena Joiner.  Thanks.

## 2020-09-14 NOTE — Progress Notes (Signed)
Needs appt with [place].  Not sure why it's with [facility].  Thanks.

## 2020-09-15 ENCOUNTER — Telehealth: Payer: Self-pay | Admitting: Orthopaedic Surgery

## 2020-09-15 NOTE — Telephone Encounter (Signed)
Left vm for pt to call back and sch her MRI with Erlinda Hong instead of [facility]. Will try calling again in a bit.

## 2020-09-17 ENCOUNTER — Ambulatory Visit: Payer: Managed Care, Other (non HMO) | Admitting: Surgical

## 2020-09-24 ENCOUNTER — Encounter: Payer: Self-pay | Admitting: Family Medicine

## 2020-09-24 ENCOUNTER — Other Ambulatory Visit: Payer: Self-pay

## 2020-09-24 ENCOUNTER — Ambulatory Visit: Payer: Managed Care, Other (non HMO) | Admitting: Family Medicine

## 2020-09-24 ENCOUNTER — Encounter: Payer: Self-pay | Admitting: Orthopaedic Surgery

## 2020-09-24 ENCOUNTER — Ambulatory Visit: Payer: Managed Care, Other (non HMO) | Admitting: Orthopaedic Surgery

## 2020-09-24 VITALS — BP 120/70 | HR 97 | Temp 98.5°F | Resp 16 | Ht 62.0 in | Wt 251.2 lb

## 2020-09-24 DIAGNOSIS — I10 Essential (primary) hypertension: Secondary | ICD-10-CM

## 2020-09-24 DIAGNOSIS — Z6841 Body Mass Index (BMI) 40.0 and over, adult: Secondary | ICD-10-CM

## 2020-09-24 DIAGNOSIS — S46812A Strain of other muscles, fascia and tendons at shoulder and upper arm level, left arm, initial encounter: Secondary | ICD-10-CM | POA: Diagnosis not present

## 2020-09-24 DIAGNOSIS — S46811A Strain of other muscles, fascia and tendons at shoulder and upper arm level, right arm, initial encounter: Secondary | ICD-10-CM | POA: Diagnosis not present

## 2020-09-24 DIAGNOSIS — M67921 Unspecified disorder of synovium and tendon, right upper arm: Secondary | ICD-10-CM | POA: Diagnosis not present

## 2020-09-24 DIAGNOSIS — M7989 Other specified soft tissue disorders: Secondary | ICD-10-CM

## 2020-09-24 DIAGNOSIS — Z1231 Encounter for screening mammogram for malignant neoplasm of breast: Secondary | ICD-10-CM

## 2020-09-24 DIAGNOSIS — E119 Type 2 diabetes mellitus without complications: Secondary | ICD-10-CM

## 2020-09-24 HISTORY — DX: Strain of other muscles, fascia and tendons at shoulder and upper arm level, left arm, initial encounter: S46.812A

## 2020-09-24 HISTORY — DX: Strain of other muscles, fascia and tendons at shoulder and upper arm level, right arm, initial encounter: S46.811A

## 2020-09-24 HISTORY — DX: Unspecified disorder of synovium and tendon, right upper arm: M67.921

## 2020-09-24 NOTE — Patient Instructions (Signed)
Please let me know if you would like a referral to a vascular specialist for further evaluation and treatment of leg swelling  Call your eye doctor and get a diabetic eye exam done  Health Maintenance  Topic Date Due   COVID-19 Vaccine (1) Never done   Urine Protein Check  Never done   Eye exam for diabetics  09/24/2020*   Zoster (Shingles) Vaccine (1 of 2) 11/02/2020*   Flu Shot  04/08/2021*   Complete foot exam   11/05/2020   Mammogram  11/05/2020   Hemoglobin A1C  11/06/2020   Pap Smear  08/21/2021   Colon Cancer Screening  12/07/2025   Tetanus Vaccine  08/21/2028   Hepatitis C Screening: USPSTF Recommendation to screen - Ages 18-79 yo.  Completed   HIV Screening  Completed   HPV Vaccine  Aged Out  *Topic was postponed. The date shown is not the original due date.

## 2020-09-24 NOTE — Progress Notes (Signed)
Patient ID: Tina Stephenson, female    DOB: 12-22-1955, 65 y.o.   MRN: [id number]  PCP: Delsa Grana, PA-C  Chief Complaint  Patient presents with   Hypertension    Subjective:   Tina Stephenson is a 65 y.o. female, presents to clinic with CC of the following:   LE edema -  some improvement previously with lasix, poor efforts with conservative measures Last OV pt saw Dr. Ky Barban and meds were changed to daily HCTZ Today she notes swelling has been a little better, she is trying to eat low salt and she is wearing her compression socks, no wounds sores rashes or hyperpigmentation to LE She also got a mobile machine that lets her pedal and exercise legs - shes trying that to see if it helps improve LE edema  Hypertension:  Was on lisinopril, lasix was prn for LE edema, now on HCTZ 12.5 mg daily Pt reports good med compliance and denies any SE.   Blood pressure today is well controlled. BP Readings from Last 3 Encounters:  09/24/20 120/70  08/16/20 130/80  08/02/20 138/72   Pt denies CP, SOB, exertional sx, LE edema, palpitation, Ha's, visual disturbances, lightheadedness, hypotension, syncope.    DM:   Pt managing DM with diet/lifestyle, onset 10/01/2019, A1C high of 6.9, improved to 5.9 w/o meds Denies: Polyuria, polydipsia, vision changes, neuropathy, hypoglycemia Recent pertinent labs: Lab Results  Component Value Date   HGBA1C 5.9 (H) 05/07/2020   HGBA1C 6.5 (H) 01/05/2020   HGBA1C 6.9 (H) 10/01/2019   Lab Results  Component Value Date   LDLCALC 47 05/07/2020   CREATININE 0.62 08/02/2020   Standard of care and health maintenance: Urine Microalbumin:  due - since Lisinopril was d/c Foot exam:  due soon, done today DM eye exam:  due- she has appt in a few months ACEI/ARB:  off - may want to try and add on low dose ACEI again for renal protection Statin:  crestor 5 mg      Patient Active Problem List   Diagnosis Date Noted   Traumatic tear of supraspinatus  tendon of right shoulder 09/24/2020   Tendinopathy of right biceps tendon 09/24/2020   Traumatic tear of supraspinatus tendon of left shoulder 09/24/2020   Leg swelling 08/02/2020   Dyslipidemia 01/05/2020   Cervical polyp 08/22/2018   Nocturia 08/22/2018   Intertrigo 08/22/2018   Type 2 diabetes mellitus without complication, without long-term current use of insulin ([place]) 08/22/2018   Cervical spine arthritis with nerve pain 06/25/2018   Herniated disc, cervical 06/25/2018   History of migraine 03/25/2018   Scoliosis 03/25/2018   History of cyst of breast 03/25/2018   Sacrum and coccyx fracture, sequela 02/21/2018   Class 3 severe obesity due to excess calories without serious comorbidity with body mass index (BMI) of 45.0 to 49.9 in adult ([place]) 02/21/2018   Cough 02/21/2018   Onychomycosis 02/21/2018   History of hypertension 02/21/2018   Vitamin D deficiency 03/19/2015   Essential hypertension 03/05/2015      Current Outpatient Medications:    acetaminophen (TYLENOL) 500 MG tablet, Take 1,000 mg by mouth 2 (two) times daily., Disp: , Rfl:    albuterol (PROVENTIL) (2.5 MG/3ML) 0.083% nebulizer solution, USE 1 VIAL VIA NEBULIZER EVERY 6 HOURS AS NEEDED FOR WHEEZING OR SHORTNESS OF BREATH, Disp: 150 mL, Rfl: 0   Ascorbic Acid (VITAMIN C) 100 MG tablet, Take 90 mg by mouth daily., Disp: , Rfl:    Biotin 10 MG TABS, Take 300  mg by mouth., Disp: , Rfl:    blood glucose meter kit and supplies KIT, Dispense based on patient and insurance preference. Use once daily as directed. (FOR ICD-10 E11.9)., Disp: 1 each, Rfl: 0   calcium gluconate 500 MG tablet, Take 1 tablet by mouth 3 (three) times daily., Disp: , Rfl:    cholecalciferol (VITAMIN D3) 25 MCG (1000 UNIT) tablet, Take 810 Units by mouth daily., Disp: , Rfl:    Copper Gluconate (COPPER CAPS PO), Take 200 mg by mouth., Disp: , Rfl:    glucose blood test strip, Use as instructed, Disp: 100 each, Rfl: 1   hydrochlorothiazide  (HYDRODIURIL) 12.5 MG tablet, Take 1 tablet (12.5 mg total) by mouth daily., Disp: 90 tablet, Rfl: 0   Lancets (ONETOUCH DELICA PLUS [id number]) MISC, USE TO CHECK BLOOD SUGAR TWICE DAILY, Disp: 100 each, Rfl: 5   magnesium 30 MG tablet, Take 400 mg by mouth 2 (two) times daily., Disp: , Rfl:    meloxicam (MOBIC) 7.5 MG tablet, Take 1 tablet (7.5 mg total) by mouth daily as needed for pain., Disp: 90 tablet, Rfl: 2   metFORMIN (GLUCOPHAGE) 500 MG tablet, Take 2 tablets (1,000 mg total) by mouth 2 (two) times daily with a meal., Disp: 240 tablet, Rfl: 3   rosuvastatin (CRESTOR) 5 MG tablet, Take 1 tablet (5 mg total) by mouth daily., Disp: 90 tablet, Rfl: 3   SUMAtriptan (IMITREX) 25 MG tablet, Take 25-50 mg po at migraine onset, and may repeat dose in 2 hours as needed for unresolved HA, Maximum daily dose 200 mg in 24 hours, Disp: 20 tablet, Rfl: 3   zinc gluconate 50 MG tablet, Take 9 mg by mouth daily., Disp: , Rfl:    Allergies  Allergen Reactions   Penicillins Other (See Comments)    "lose my hearing"     Social History   Tobacco Use   Smoking status: Never   Smokeless tobacco: Never  Vaping Use   Vaping Use: Never used  Substance Use Topics   Alcohol use: Never   Drug use: Never      Chart Review Today: I personally reviewed active problem list, medication list, allergies, family history, social history, health maintenance, notes from last encounter, lab results, imaging with the patient/caregiver today.   Review of Systems  Constitutional: Negative.   HENT: Negative.    Eyes: Negative.   Respiratory: Negative.    Cardiovascular: Negative.   Gastrointestinal: Negative.   Endocrine: Negative.   Genitourinary: Negative.   Musculoskeletal: Negative.   Skin: Negative.   Allergic/Immunologic: Negative.   Neurological: Negative.   Hematological: Negative.   Psychiatric/Behavioral: Negative.    All other systems reviewed and are negative.     Objective:   Vitals:    09/24/20 1510  BP: 120/70  Pulse: 97  Resp: 16  Temp: 98.5 F (36.9 C)  SpO2: 98%  Weight: 251 lb 3.2 oz (113.9 kg)  Height: _0  (1.575 m)    Body mass index is 45.95 kg/m.  Physical Exam Vitals and nursing note reviewed.  Constitutional:      General: She is not in acute distress.    Appearance: Normal appearance. She is well-developed and well-groomed. She is obese. She is not ill-appearing, toxic-appearing or diaphoretic.     Interventions: Face mask in place.  HENT:     Head: Normocephalic and atraumatic.     Right Ear: External ear normal.     Left Ear: External ear normal.  Eyes:  General: Lids are normal. No scleral icterus.       Right eye: No discharge.        Left eye: No discharge.     Conjunctiva/sclera: Conjunctivae normal.  Neck:     Trachea: Phonation normal. No tracheal deviation.  Cardiovascular:     Rate and Rhythm: Normal rate and regular rhythm.     Pulses: Normal pulses.          Radial pulses are 2+ on the right side and 2+ on the left side.       Posterior tibial pulses are 2+ on the right side and 2+ on the left side.     Heart sounds: Normal heart sounds. No murmur heard.   No friction rub. No gallop.     Comments: Wearing compression stocking b/l Pulmonary:     Effort: Pulmonary effort is normal. No respiratory distress.     Breath sounds: Normal breath sounds. No stridor. No wheezing, rhonchi or rales.  Chest:     Chest wall: No tenderness.  Abdominal:     General: Bowel sounds are normal. There is no distension.     Palpations: Abdomen is soft.  Musculoskeletal:     Right lower leg: 1+ Edema present.     Left lower leg: 1+ Edema present.  Skin:    General: Skin is warm and dry.     Coloration: Skin is not jaundiced or pale.     Findings: No rash.  Neurological:     Mental Status: She is alert.     Motor: No abnormal muscle tone.     Gait: Gait normal.  Psychiatric:        Mood and Affect: Mood normal.        Speech: Speech  normal.        Behavior: Behavior normal. Behavior is cooperative.     Results for orders placed or performed in visit on 08/02/20  Comprehensive metabolic panel  Result Value Ref Range   Glucose 92 65 - 99 mg/dL   BUN 19 8 - 27 mg/dL   Creatinine, Ser 0.62 0.57 - 1.00 mg/dL   eGFR 100 >59 mL/min/1.73   BUN/Creatinine Ratio 31 (H) 12 - 28   Sodium 141 134 - 144 mmol/L   Potassium 4.4 3.5 - 5.2 mmol/L   Chloride 103 96 - 106 mmol/L   CO2 23 20 - 29 mmol/L   Calcium 9.6 8.7 - 10.3 mg/dL   Total Protein 6.8 6.0 - 8.5 g/dL   Albumin 4.6 3.8 - 4.8 g/dL   Globulin, Total 2.2 1.5 - 4.5 g/dL   Albumin/Globulin Ratio 2.1 1.2 - 2.2   Bilirubin Total 1.1 0.0 - 1.2 mg/dL   Alkaline Phosphatase 99 44 - 121 IU/L   AST 22 0 - 40 IU/L   ALT 27 0 - 32 IU/L  B Nat Peptide  Result Value Ref Range   BNP 23.6 0.0 - 100.0 pg/mL  CBC  Result Value Ref Range   WBC 9.0 3.4 - 10.8 x10E3/uL   RBC 4.32 3.77 - 5.28 x10E6/uL   Hemoglobin 12.5 11.1 - 15.9 g/dL   Hematocrit 38.5 34.0 - 46.6 %   MCV 89 79 - 97 fL   MCH 28.9 26.6 - 33.0 pg   MCHC 32.5 31.5 - 35.7 g/dL   RDW 12.3 11.7 - 15.4 %   Platelets 321 150 - 450 x10E3/uL   Diabetic Foot Exam - Simple   Simple Foot Form Diabetic Foot exam  was performed with the following findings: Yes 09/24/2020  3:50 PM  Visual Inspection No deformities, no ulcerations, no other skin breakdown bilaterally: Yes Sensation Testing Intact to touch and monofilament testing bilaterally: Yes Pulse Check Posterior Tibialis and Dorsalis pulse intact bilaterally: Yes Comments Great toe nail bilaterally raised thickened fungal disease, not ingrown, no swelling or pain        Assessment & Plan:     ICD-10-CM   1. Essential hypertension  I10 Comprehensive Metabolic Panel (CMET)    CANCELED: COMPLETE METABOLIC PANEL WITH GFR   stable, well controlled, BP actually improved with change to HCTZ, recheck labs due to med change    2. Leg swelling  M79.89 Comprehensive  Metabolic Panel (CMET)    CANCELED: COMPLETE METABOLIC PANEL WITH GFR   slight improvement with HCTZ, pt working more on conservative measures, offered vascular referral if any worsening    3. Class 3 severe obesity due to excess calories without serious comorbidity with body mass index (BMI) of 45.0 to 49.9 in adult [facility])  E66.01    Z68.42    working on healthy diet, currently limited on physical activity due to b/l shoulder rotator cuff tears    4. Type 2 diabetes mellitus without complication, without long-term current use of insulin ([facility])  E11.9 Microalbumin, urine    Hemoglobin A1c    Comprehensive Metabolic Panel (CMET)    CANCELED: COMPLETE METABOLIC PANEL WITH GFR    CANCELED: Hemoglobin A1c    CANCELED: Microalbumin, urine   last labs DM well controlled with diet/lifestyle efforts and not on meds, off ACEI, urine micro indicated, recheck A1C and CMP, continue efforts    5. Encounter for screening mammogram for malignant neoplasm of breast  Z12.31 MM 3D SCREEN BREAST BILATERAL       Likely 2/2 venous stasis given history/exam, recommend compression, elevation, frequent walking. Consider referral for compression stocking fitting if unable to tolerate generic stockings.    Delsa Grana, PA-C 09/24/20 3:26 PM

## 2020-09-24 NOTE — Progress Notes (Signed)
Office Visit Note   Patient: Tina Stephenson           Date of Birth: 08/12/1955           MRN: [id number] Visit Date: 09/24/2020              Requested by: Delsa Grana, PA-C [address] [place],  [place] [address] PCP: Delsa Grana, PA-C   Assessment & Plan: Visit Diagnoses:  1. Traumatic tear of supraspinatus tendon of right shoulder, initial encounter   2. Tendinopathy of right biceps tendon   3. Traumatic tear of supraspinatus tendon of left shoulder, initial encounter     Plan: MRI of the right shoulder shows a full-thickness supraspinatus tear with mild retraction and biceps tendinopathy.  Left shoulder MRI shows a full-thickness supraspinatus tear with mild retraction.  Given these findings I have recommended arthroscopic rotator cuff repair of the right shoulder first.  Likely biceps tenotomy with extensive debridement subacromial decompression.  We discussed at length the risk benefits rehab recovery time out of work associated with the surgery.  We will plan for the left shoulder surgery once she has recovered from the right.  Follow-Up Instructions: Return for postop.   Orders:  No orders of the defined types were placed in this encounter.  No orders of the defined types were placed in this encounter.     Procedures: No procedures performed   Clinical Data: No additional findings.   Subjective: Chief Complaint  Patient presents with   Left Shoulder - Pain   Right Shoulder - Pain    HPI  Tina Stephenson follows up today for MRI review of both shoulders.  Right shoulder still feels worse than the left.  Having a lot of trouble with reaching out and up above the head.  Review of Systems   Objective: Vital Signs: There were no vitals taken for this visit.  Physical Exam  Ortho Exam  Examination of the right shoulder shows moderate pain and weakness with with manual muscle testing of the supraspinatus.  Positive speeds sign.  AC joint  asymptomatic.  Examination of the left shoulder shows mild pain and weakness with manual muscle testing of the supraspinatus.  Negative Speed sign.  AC joint asymptomatic.  Passive range of motion preserved in both shoulders.  Specialty Comments:  No specialty comments available.  Imaging: No results found.   PMFS History: Patient Active Problem List   Diagnosis Date Noted   Traumatic tear of supraspinatus tendon of right shoulder 09/24/2020   Tendinopathy of right biceps tendon 09/24/2020   Traumatic tear of supraspinatus tendon of left shoulder 09/24/2020   Leg swelling 08/02/2020   Dyslipidemia 01/05/2020   Cervical polyp 08/22/2018   Nocturia 08/22/2018   Intertrigo 08/22/2018   Type 2 diabetes mellitus without complication, without long-term current use of insulin ([place]) 08/22/2018   Cervical spine arthritis with nerve pain 06/25/2018   Herniated disc, cervical 06/25/2018   History of migraine 03/25/2018   Scoliosis 03/25/2018   History of cyst of breast 03/25/2018   Sacrum and coccyx fracture, sequela 02/21/2018   Class 3 severe obesity due to excess calories without serious comorbidity with body mass index (BMI) of 45.0 to 49.9 in adult [facility]) 02/21/2018   Cough 02/21/2018   Onychomycosis 02/21/2018   History of hypertension 02/21/2018   Vitamin D deficiency 03/19/2015   Essential hypertension 03/05/2015   Past Medical History:  Diagnosis Date   Bronchitis    Depression    Diabetes mellitus  without complication (HCC)    Gallbladder polyp    GERD (gastroesophageal reflux disease)    Hypertension    Hypertensive disorder 03/05/2015   Liver tumor (benign)    Sacrum and coccyx fracture (HCC)    Sciatica     Family History  Problem Relation Age of Onset   COPD Mother    Cancer Mother    Heart disease Father    Breast cancer Paternal Aunt    Diabetes Brother    Diabetes Maternal Grandfather    Ovarian cancer Neg Hx    Colon cancer Neg Hx     Past Surgical  History:  Procedure Laterality Date   APPENDECTOMY     NASAL SEPTUM SURGERY     WISDOM TOOTH EXTRACTION     Social History   Occupational History   Not on file  Tobacco Use   Smoking status: Never   Smokeless tobacco: Never  Vaping Use   Vaping Use: Never used  Substance and Sexual Activity   Alcohol use: Never   Drug use: Never   Sexual activity: Not Currently    Birth control/protection: None

## 2020-09-25 ENCOUNTER — Encounter: Payer: Self-pay | Admitting: Orthopaedic Surgery

## 2020-09-25 ENCOUNTER — Encounter: Payer: Self-pay | Admitting: Family Medicine

## 2020-09-25 LAB — COMPREHENSIVE METABOLIC PANEL
ALT: 34 IU/L — ABNORMAL HIGH (ref 0–32)
AST: 26 IU/L (ref 0–40)
Albumin/Globulin Ratio: 2.2 (ref 1.2–2.2)
Albumin: 4.6 g/dL (ref 3.8–4.8)
Alkaline Phosphatase: 101 IU/L (ref 44–121)
BUN/Creatinine Ratio: 22 (ref 12–28)
BUN: 19 mg/dL (ref 8–27)
Bilirubin Total: 0.8 mg/dL (ref 0.0–1.2)
CO2: 27 mmol/L (ref 20–29)
Calcium: 9.5 mg/dL (ref 8.7–10.3)
Chloride: 100 mmol/L (ref 96–106)
Creatinine, Ser: 0.86 mg/dL (ref 0.57–1.00)
Globulin, Total: 2.1 g/dL (ref 1.5–4.5)
Glucose: 164 mg/dL — ABNORMAL HIGH (ref 65–99)
Potassium: 4.2 mmol/L (ref 3.5–5.2)
Sodium: 144 mmol/L (ref 134–144)
Total Protein: 6.7 g/dL (ref 6.0–8.5)
eGFR: 76 mL/min/{1.73_m2} (ref >59)

## 2020-09-25 LAB — MICROALBUMIN, URINE: Microalbumin, Urine: 18 ug/mL

## 2020-09-25 LAB — HEMOGLOBIN A1C
Est. average glucose Bld gHb Est-mCnc: 137 mg/dL
Hgb A1c MFr Bld: 6.4 % — ABNORMAL HIGH (ref 4.8–5.6)

## 2020-09-26 ENCOUNTER — Encounter: Payer: Self-pay | Admitting: Orthopaedic Surgery

## 2020-09-27 ENCOUNTER — Encounter: Payer: Self-pay | Admitting: Family Medicine

## 2020-09-27 ENCOUNTER — Other Ambulatory Visit: Payer: Self-pay | Admitting: Family Medicine

## 2020-09-27 ENCOUNTER — Encounter: Payer: Self-pay | Admitting: Orthopaedic Surgery

## 2020-09-27 DIAGNOSIS — B351 Tinea unguium: Secondary | ICD-10-CM

## 2020-09-27 MED ORDER — TERBINAFINE HCL 250 MG PO TABS: 250.0000 mg | ORAL_TABLET | Freq: Every day | ORAL | 2 refills | Status: DC

## 2020-09-27 NOTE — Telephone Encounter (Signed)
Please send her there.  Thanks.

## 2020-09-28 ENCOUNTER — Other Ambulatory Visit: Payer: Self-pay

## 2020-09-28 DIAGNOSIS — S46811A Strain of other muscles, fascia and tendons at shoulder and upper arm level, right arm, initial encounter: Secondary | ICD-10-CM

## 2020-09-28 DIAGNOSIS — S46812A Strain of other muscles, fascia and tendons at shoulder and upper arm level, left arm, initial encounter: Secondary | ICD-10-CM

## 2020-09-28 NOTE — Telephone Encounter (Signed)
Referral made. They will call her to schedule appt.

## 2020-09-29 ENCOUNTER — Encounter: Payer: Self-pay | Admitting: Orthopaedic Surgery

## 2020-09-29 ENCOUNTER — Telehealth: Payer: Self-pay | Admitting: Orthopaedic Surgery

## 2020-09-29 NOTE — Telephone Encounter (Signed)
[employer] forms received. To [employer] ?

## 2020-09-29 NOTE — Telephone Encounter (Signed)
Yes 6 weeks is a reasonable time to out of work.  Thanks.

## 2020-09-30 ENCOUNTER — Encounter: Payer: Self-pay | Admitting: Orthopaedic Surgery

## 2020-10-04 ENCOUNTER — Encounter: Payer: Self-pay | Admitting: Orthopaedic Surgery

## 2020-10-05 ENCOUNTER — Telehealth: Payer: Self-pay | Admitting: Orthopaedic Surgery

## 2020-10-05 NOTE — Telephone Encounter (Signed)
Called patient left voicemail message advised patient that [employer] has all of her paperwork and payment. I gave phone number to contact [facility] direct for follow-up.

## 2020-10-10 ENCOUNTER — Encounter: Payer: Self-pay | Admitting: Orthopaedic Surgery

## 2020-10-11 ENCOUNTER — Encounter: Payer: Self-pay | Admitting: Orthopaedic Surgery

## 2020-10-11 ENCOUNTER — Other Ambulatory Visit: Payer: Self-pay | Admitting: Physician Assistant

## 2020-10-11 MED ORDER — OXYCODONE-ACETAMINOPHEN 5-325 MG PO TABS: 1.0000 | ORAL_TABLET | Freq: Four times a day (QID) | ORAL | 0 refills | Status: DC | PRN

## 2020-10-11 MED ORDER — ONDANSETRON HCL 4 MG PO TABS: 4.0000 mg | ORAL_TABLET | Freq: Three times a day (TID) | ORAL | 0 refills | Status: DC | PRN

## 2020-10-11 NOTE — Telephone Encounter (Signed)
I'm assuming this is for post-op?  I sent post-op meds to [facility] just now

## 2020-10-12 NOTE — Telephone Encounter (Signed)
Yes that's fine. Thanks  

## 2020-10-14 ENCOUNTER — Encounter: Payer: Self-pay | Admitting: Orthopaedic Surgery

## 2020-10-14 DIAGNOSIS — S46811A Strain of other muscles, fascia and tendons at shoulder and upper arm level, right arm, initial encounter: Secondary | ICD-10-CM

## 2020-10-17 ENCOUNTER — Encounter: Payer: Self-pay | Admitting: Orthopaedic Surgery

## 2020-10-21 ENCOUNTER — Other Ambulatory Visit: Payer: Self-pay

## 2020-10-21 ENCOUNTER — Ambulatory Visit (INDEPENDENT_AMBULATORY_CARE_PROVIDER_SITE_OTHER): Payer: Managed Care, Other (non HMO) | Admitting: Physician Assistant

## 2020-10-21 ENCOUNTER — Encounter: Payer: Self-pay | Admitting: Orthopaedic Surgery

## 2020-10-21 DIAGNOSIS — Z9889 Other specified postprocedural states: Secondary | ICD-10-CM

## 2020-10-21 NOTE — Telephone Encounter (Signed)
Do you know anything about this form she's referring to?

## 2020-10-21 NOTE — Progress Notes (Signed)
Post-Op Visit Note   Patient: Tina Stephenson           Date of Birth: 14-Aug-1955           MRN: [id number] Visit Date: 10/21/2020 PCP: Delsa Grana, PA-C   Assessment & Plan:  Chief Complaint:  Chief Complaint  Patient presents with   Right Shoulder - Follow-up    Right shoulder arthroscopy 10/14/2020   Visit Diagnoses:  1. S/P right rotator cuff repair     Plan: Patient is a pleasant 65 year old female who comes in today 1 week status post right shoulder debridement and rotator cuff repair, date of surgery 10/14/2020.  She has been doing well.  She has been compliant wearing her sling.  She is taking oxycodone as needed for pain.  Examination of the right shoulder reveals well-healed surgical portals with nylon sutures in place.  No evidence of infection or cellulitis.  She is neurovascular intact distally.  Today, sutures were removed and Steri-Strips applied.  Intraoperative pictures reviewed.  She will continue wearing her sling for another 5 weeks.  I have submitted an external referral for physical therapy at [facility].  She will follow-up with [place] in 5 weeks time for recheck.  Call with concerns or questions in the meantime.  Follow-Up Instructions: Return in about 5 weeks (around 11/25/2020).   Orders:  No orders of the defined types were placed in this encounter.  No orders of the defined types were placed in this encounter.   Imaging: No new imaging  PMFS History: Patient Active Problem List   Diagnosis Date Noted   Traumatic tear of supraspinatus tendon of right shoulder 09/24/2020   Tendinopathy of right biceps tendon 09/24/2020   Traumatic tear of supraspinatus tendon of left shoulder 09/24/2020   Leg swelling 08/02/2020   Dyslipidemia 01/05/2020   Cervical polyp 08/22/2018   Nocturia 08/22/2018   Intertrigo 08/22/2018   Type 2 diabetes mellitus without complication, without long-term current use of insulin ([place]) 08/22/2018   Cervical  spine arthritis with nerve pain 06/25/2018   Herniated disc, cervical 06/25/2018   History of migraine 03/25/2018   Scoliosis 03/25/2018   History of cyst of breast 03/25/2018   Sacrum and coccyx fracture, sequela 02/21/2018   Class 3 severe obesity due to excess calories without serious comorbidity with body mass index (BMI) of 45.0 to 49.9 in adult ([place]) 02/21/2018   Cough 02/21/2018   Onychomycosis 02/21/2018   History of hypertension 02/21/2018   Vitamin D deficiency 03/19/2015   Essential hypertension 03/05/2015   Past Medical History:  Diagnosis Date   Bronchitis    Depression    Diabetes mellitus without complication ([place])    Gallbladder polyp    GERD (gastroesophageal reflux disease)    Hypertension    Hypertensive disorder 03/05/2015   Liver tumor (benign)    Sacrum and coccyx fracture ([place])    Sciatica     Family History  Problem Relation Age of Onset   COPD Mother    Cancer Mother    Heart disease Father    Breast cancer Paternal Aunt    Diabetes Brother    Diabetes Maternal Grandfather    Ovarian cancer Neg Hx    Colon cancer Neg Hx     Past Surgical History:  Procedure Laterality Date   APPENDECTOMY     NASAL SEPTUM SURGERY     WISDOM TOOTH EXTRACTION     Social History   Occupational History   Not  on file  Tobacco Use   Smoking status: Never   Smokeless tobacco: Never  Vaping Use   Vaping Use: Never used  Substance and Sexual Activity   Alcohol use: Never   Drug use: Never   Sexual activity: Not Currently    Birth control/protection: None

## 2020-10-26 ENCOUNTER — Encounter: Payer: Self-pay | Admitting: Orthopaedic Surgery

## 2020-10-30 ENCOUNTER — Encounter: Payer: Self-pay | Admitting: Orthopaedic Surgery

## 2020-11-03 ENCOUNTER — Ambulatory Visit (INDEPENDENT_AMBULATORY_CARE_PROVIDER_SITE_OTHER): Payer: Managed Care, Other (non HMO) | Admitting: Physical Therapy

## 2020-11-03 ENCOUNTER — Encounter: Payer: Self-pay | Admitting: Physical Therapy

## 2020-11-03 ENCOUNTER — Other Ambulatory Visit: Payer: Self-pay

## 2020-11-03 DIAGNOSIS — M25511 Pain in right shoulder: Secondary | ICD-10-CM | POA: Diagnosis not present

## 2020-11-03 DIAGNOSIS — R293 Abnormal posture: Secondary | ICD-10-CM | POA: Diagnosis not present

## 2020-11-03 DIAGNOSIS — M6281 Muscle weakness (generalized): Secondary | ICD-10-CM | POA: Diagnosis not present

## 2020-11-03 DIAGNOSIS — R600 Localized edema: Secondary | ICD-10-CM

## 2020-11-03 DIAGNOSIS — M25611 Stiffness of right shoulder, not elsewhere classified: Secondary | ICD-10-CM

## 2020-11-03 NOTE — Therapy (Signed)
[facility] [address] [place], [place], [address] Phone: [phone]   Fax:  [phone]  Physical Therapy Evaluation  Patient Details  Name: Tina Stephenson MRN: [id number] Date of Birth: [birth year] Referring Provider (PT): Dwana Melena, [place]   Encounter Date: 11/03/2020   PT End of Session - 11/03/20 1528     Visit Number 1    Number of Visits 24    Date for PT Re-Evaluation 01/07/21    Authorization Type [employer] Managed    Authorization Time Period 20% CO-INSURANCE, DED MET, 60 DAYS PER CALENDER YEAR AFTER 5TH VISIT, MEDICAL NESSITY NEEDED/REF#[id number]    Progress Note Due on Visit 10    PT Start Time 1430    PT Stop Time 1516    PT Time Calculation (min) 46 min    Activity Tolerance Patient limited by pain    Behavior During Therapy [facility] for tasks assessed/performed             Past Medical History:  Diagnosis Date   Bronchitis    Depression    Diabetes mellitus without complication ([place])    Gallbladder polyp    GERD (gastroesophageal reflux disease)    Hypertension    Hypertensive disorder 03/05/2015   Liver tumor (benign)    Sacrum and coccyx fracture ([place])    Sciatica     Past Surgical History:  Procedure Laterality Date   APPENDECTOMY     NASAL SEPTUM SURGERY     WISDOM TOOTH EXTRACTION      There were no vitals filed for this visit.    Subjective Assessment - 11/03/20 1437     Subjective This 65yo female was referred by Dwana Melena, PA with [phone] (ICD-10-CM) - S/P right rotator cuff repair on 10/14/2020 due to traumatic tear of Supraspinatus. She has been wearing sling 24hr except shower.    Pertinent History DM2, cervical herniated disc, scoliosis, obesity, HTN,  pt reports lumbar & cervical fractures,    Patient Stated Goals To move dominant arm, no pain, full range, return to work    Currently in Pain? Yes    Pain Score 5    in last week, lowest 1-2/10 and highest 10/10 for ~2 minutes   Pain Location Shoulder     Pain Orientation Right;Lateral;Anterior   clavicle   Pain Descriptors / Indicators Sharp;Sore    Pain Type Surgical pain    Pain Onset 1 to 4 weeks ago    Pain Frequency Intermittent    Aggravating Factors  move it too far like when showering    Pain Relieving Factors OTC tylenol, ice,    Effect of Pain on Daily Activities careful with movements when showering / out of sling                [facility] PT Assessment - 11/03/20 1430       Assessment   Medical Diagnosis Z98.890 (ICD-10-CM) - S/P right rotator cuff repair    Referring Provider (PT) Dwana Melena, PA    Onset Date/Surgical Date 10/14/20    Hand Dominance Right    Prior Therapy none      Precautions   Precautions Shoulder    Type of Shoulder Precautions wear sling for 5 weeks (next MD appt 11/17), don't move arm      Balance Screen   Has the patient fallen in the past 6 months Yes    How many times? 1   off balance,   Has the patient had a decrease in  activity level because of a fear of falling?  No    Is the patient reluctant to leave their home because of a fear of falling?  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    Focus on Therapeutic Outcomes (FOTO)  21% with Target 55%      Posture/Postural Control   Posture/Postural Control Postural limitations    Postural Limitations Rounded Shoulders;Forward head;Decreased lumbar lordosis;Flexed trunk      ROM / Strength   AROM / PROM / Strength  PROM;Strength      PROM   PROM Assessment Site Shoulder    Right Shoulder Flexion 41 Degrees   supine short lever arm (elbow bent)   Right Shoulder ABduction 61 Degrees   supine short lever arm (elbow bent)   Right Shoulder Internal Rotation 33 Degrees   supine scaption   Right Shoulder External Rotation 11 Degrees   supine scaption                       Objective measurements completed on examination: See above findings.       [facility] PT Treatment/Exercise - 11/03/20 1430       Self-Care   Self-Care ADL's    ADL's PT demo & verbal cues on positioning in left sidelying when allowed to sleep without sling.  Pt verbalized understanding.      Exercises   Exercises Shoulder;Elbow      Elbow Exercises   Elbow Flexion Limitations flexion supine AROM 10 reps PT support humerus/ shoulder    Elbow Extension AROM;Right;10 reps;Supine    Elbow Extension Limitations PT support humerus/ shoulder      Shoulder Exercises: Supine   Flexion PROM;Right;20 reps    Flexion Limitations short lever arm (elbow flexed) with less gaurding & nonverbal discomfort noted.    ABduction PROM;Right;20 reps    ABduction Limitations short lever arm (elbow flexed) with less gaurding & nonverbal discomfort noted.      Shoulder Exercises: Standing   Retraction AROM;Both;10 reps    Retraction Limitations standing with back to doorframe                     PT Education - 11/03/20 1527     Education Details standing scapula retraction at doorframe 5 sec hold 10 reps 5+/day (PT recommended each time exiting bathroom)    Person(s) Educated Patient    Methods Explanation;Demonstration;Tactile cues;Verbal cues    Comprehension Verbalized understanding;Returned demonstration              PT Short Term Goals - 11/03/20 1540       PT SHORT TERM GOAL #1   Title FOTO improved to 35%    Time 4    Period Weeks    Status New    Target Date 11/23/20  PT SHORT TERM GOAL  #2   Title right shoulder PROM flexion & abduction 100*    Time 4    Period Weeks    Status New    Target Date 11/23/20      PT SHORT TERM GOAL #3   Title Patient is independent with initial HEP    Time 4    Period Weeks    Status New    Target Date 11/23/20               PT Long Term Goals - 11/03/20 1541       PT LONG TERM GOAL #1   Title Patient reports pain in right shoulder </= 2/10 with activities    Time 10    Period Weeks    Status New    Target Date 01/07/21      PT LONG TERM GOAL #2   Title FOTO >/= 55%    Time 10    Period Weeks    Status New    Target Date 01/07/21      PT LONG TERM GOAL #3   Title Right shoulder AROM Flexion & abduction 120* and external & internal rotation 60*    Time 10    Period Weeks    Status New    Target Date 01/07/21      PT LONG TERM GOAL #4   Title Right shoulder strength >/= 4/5    Time 10    Period Weeks    Status New    Target Date 01/07/21                    Plan - 11/03/20 1533     Clinical Impression Statement Patient had right rotator cuff repair 20 days prior to PT evaluation.  She has pain, edema and decreased range of motion limiting motion & use of her dominant right upper extremity.  Patient appears would have weakness but not tested due to time frame from surgery.  Patient would benefit from skilled PT to improve function of dominant right arm without pain.    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, jewellery Low    Rehab Potential Good    PT Frequency 3x / week   3x/wk for 4 weeks, then 2x/wk for 6 weeks   PT Duration Other (comment)   10 weeks   PT Treatment/Interventions ADLs/Self Care Home  Management;Cryotherapy;Electrical Stimulation;Moist Heat;Functional mobility training;Therapeutic activities;Therapeutic exercise;Balance training;Neuromuscular re-education;Patient/family education;Manual techniques;Scar mobilization;Passive range of motion;Vasopneumatic Device;Joint Manipulations    PT Next Visit Plan HEP, PROM & manual therapy to increase ROM    Consulted and Agree with Plan of Care Patient             Patient will benefit from skilled therapeutic intervention in order to improve the following deficits and impairments:  Decreased coordination, Decreased endurance, Decreased range of motion, Decreased strength, Increased edema, Increased muscle spasms, Postural dysfunction, Obesity, Pain  Visit Diagnosis: Stiffness of right shoulder, not elsewhere classified  Acute pain of right shoulder  Muscle weakness (generalized)  Abnormal posture  Localized edema     Problem List Patient Active Problem List   Diagnosis Date Noted   Traumatic tear of supraspinatus tendon of right shoulder 09/24/2020   Tendinopathy of right biceps tendon  09/24/2020   Traumatic tear of supraspinatus tendon of left shoulder 09/24/2020   Leg swelling 08/02/2020   Dyslipidemia 01/05/2020   Cervical polyp 08/22/2018   Nocturia 08/22/2018   Intertrigo 08/22/2018   Type 2 diabetes mellitus without complication, without long-term current use of insulin ([place]) 08/22/2018   Cervical spine arthritis with nerve pain 06/25/2018   Herniated disc, cervical 06/25/2018   History of migraine 03/25/2018   Scoliosis 03/25/2018   History of cyst of breast 03/25/2018   Sacrum and coccyx fracture, sequela 02/21/2018   Class 3 severe obesity due to excess calories without serious comorbidity with body mass index (BMI) of 45.0 to 49.9 in adult [facility]) 02/21/2018   Cough 02/21/2018   Onychomycosis 02/21/2018   History of hypertension 02/21/2018   Vitamin D deficiency 03/19/2015   Essential hypertension  03/05/2015    Jamey Reas, PT, DPT 11/03/2020, 3:49 PM  [facility] Physical Therapy [address] [place], [place], [address] Phone: [phone]   Fax:  [phone]  Name: Tina Stephenson MRN: [id number] Date of Birth: 1955/04/25

## 2020-11-04 ENCOUNTER — Ambulatory Visit (INDEPENDENT_AMBULATORY_CARE_PROVIDER_SITE_OTHER): Payer: Managed Care, Other (non HMO) | Admitting: Physical Therapy

## 2020-11-04 ENCOUNTER — Encounter: Payer: Self-pay | Admitting: Physical Therapy

## 2020-11-04 DIAGNOSIS — M25611 Stiffness of right shoulder, not elsewhere classified: Secondary | ICD-10-CM

## 2020-11-04 DIAGNOSIS — M25511 Pain in right shoulder: Secondary | ICD-10-CM

## 2020-11-04 DIAGNOSIS — M6281 Muscle weakness (generalized): Secondary | ICD-10-CM

## 2020-11-04 DIAGNOSIS — R600 Localized edema: Secondary | ICD-10-CM

## 2020-11-04 DIAGNOSIS — R293 Abnormal posture: Secondary | ICD-10-CM | POA: Diagnosis not present

## 2020-11-04 NOTE — Patient Instructions (Signed)
Access Code: [email or web address] URL: [email or web address] Date: 11/04/2020 Prepared by: Faustino Congress  Exercises Seated Scapular Retraction - 5 x daily - 7 x weekly - 1 sets - 10 reps - 5 sec hold Seated Elbow Flexion and Extension AROM - 5 x daily - 7 x weekly - 1 sets - 10 reps Seated Gripping Towel - 5 x daily - 7 x weekly - 1 sets - 10 reps - 5 sec hold Circular Shoulder Pendulum with Table Support - 5 x daily - 7 x weekly - 1 sets - 20 reps Flexion-Extension Shoulder Pendulum with Table Support - 5 x daily - 7 x weekly - 1 sets - 10 reps Horizontal Shoulder Pendulum with Table Support - 5 x daily - 7 x weekly - 1 sets - 10 reps

## 2020-11-04 NOTE — Progress Notes (Signed)
Established Patient Office Visit  Subjective:  Patient ID: Tina Stephenson, female    DOB: [birth year]  Age: 65 y.o. MRN: [id number]  CC:  Chief Complaint  Patient presents with   Hypertension   Hyperlipidemia    HPI Tina Stephenson presents for follow up on chronic medical conditions, which include HTN, DM2, migraines.  She had her right rotator cuff repaired on October 6 of this year.  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  The anesthesiologist gave her albuterol to use as needed for shortness of breath afterwards, she states she only had to use it a couple of days after the procedure and has not had to use it since.  She denies any current shortness of breath, wheezing, cough.  She has never had any history of lung diseases in the past.  She does not smoke.  Hypertension: -Medications: HCTZ 12.5 mg daily -Patient is compliant with above medications and reports no side effects. -Checking BP at home (average): Yes been good, highest about 135/80, once low to 90/50 about 1 month ago -Denies any SOB, CP, vision changes, some dizziness at PT with position changes  Diabetes, Type 2: -Last A1c 6.4% in 9/22 -Medications: Metformin 1000 mg twice daily -Patient is compliant with the above medications and reports no side effects.  -Checking BG at home: checks when feeling bad, fasting averages 99, 126 after eating  -Diet: eating more veggies, fruit and tries not to skip meals  -Exercise: walks but hard time due to arthritis  -Eye exam: had to cancel last appointment, next one January  -Foot exam: 9/22 -Statin: Yes Crestor 5 mg daily -Politely declines pneumonia vaccine  -Denies symptoms of hypoglycemia, polyuria, polydipsia, numbness extremities, foot ulcers/trauma.   Migraines: She is currently on Imitrex  25 mg as needed for migraine. Last migraine was 2 months ago. Hasn't tried Imitrex yet, usually does well with anti-inflammatories.   Onychomycosis: She was diagnosed with this on both of her feet about 1 month ago.  She was started on Lamisil at that time.  She states she has not noticed any changes in her nails and starting the medication.  A CMP at baseline was obtained prior to starting medication, which was normal.  Health maintenance: -Mammogram ordered at her last visit, will schedule in January  -Colonoscopy 2017, recommend repeating in 10 years   Past Medical History:  Diagnosis Date   Bronchitis    Depression    Diabetes mellitus without complication ([place])    Gallbladder polyp    GERD (gastroesophageal reflux disease)    Hypertension    Hypertensive disorder 03/05/2015   Liver tumor (benign)    Sacrum and coccyx fracture ([facility])    Sciatica     Past Surgical History:  Procedure Laterality Date   APPENDECTOMY     NASAL SEPTUM SURGERY     WISDOM TOOTH EXTRACTION      Family History  Problem Relation Age of Onset   COPD Mother    Cancer Mother    Heart disease Father    Breast cancer Paternal Aunt    Diabetes Brother    Diabetes Maternal Grandfather    Ovarian cancer Neg Hx    Colon cancer Neg Hx     Social History   Socioeconomic History   Marital status: Single  Spouse name: Not on file   Number of children: 0   Years of education: Not on file   Highest education level: Not on file  Occupational History   Not on file  Tobacco Use   Smoking status: Never   Smokeless tobacco: Never  Vaping Use   Vaping Use: Never used  Substance and Sexual Activity   Alcohol use: Never   Drug use: Never   Sexual activity: Not Currently    Birth control/protection: None  Other Topics Concern   Not on file  Social History Narrative   Not on file   Social Determinants of Health   Financial Resource Strain: Not on file  Food Insecurity: Not on file   Transportation Needs: Not on file  Physical Activity: Not on file  Stress: Not on file  Social Connections: Not on file  Intimate Partner Violence: Not on file    Outpatient Medications Prior to Visit  Medication Sig Dispense Refill   acetaminophen (TYLENOL) 500 MG tablet Take 1,000 mg by mouth 2 (two) times daily.     albuterol (PROVENTIL) (2.5 MG/3ML) 0.083% nebulizer solution USE 1 VIAL VIA NEBULIZER EVERY 6 HOURS AS NEEDED FOR WHEEZING OR SHORTNESS OF BREATH 150 mL 0   blood glucose meter kit and supplies KIT Dispense based on patient and insurance preference. Use once daily as directed. (FOR ICD-10 E11.9). 1 each 0   glucose blood test strip Use as instructed 100 each 1   hydrochlorothiazide (HYDRODIURIL) 12.5 MG tablet Take 1 tablet (12.5 mg total) by mouth daily. 90 tablet 0   Lancets (ONETOUCH DELICA PLUS [id number]) MISC USE TO CHECK BLOOD SUGAR TWICE DAILY 100 each 5   metFORMIN (GLUCOPHAGE) 500 MG tablet Take 2 tablets (1,000 mg total) by mouth 2 (two) times daily with a meal. 240 tablet 3   Multiple Vitamins-Minerals (MULTIVITAMIN WITH MINERALS) tablet Take 1 tablet by mouth daily.     ondansetron (ZOFRAN) 4 MG tablet Take 1 tablet (4 mg total) by mouth every 8 (eight) hours as needed for nausea or vomiting. 40 tablet 0   oxyCODONE-acetaminophen (PERCOCET) 5-325 MG tablet Take 1-2 tablets by mouth every 6 (six) hours as needed. To be taken after surgery (Patient not taking: Reported on 11/03/2020) 40 tablet 0   rosuvastatin (CRESTOR) 5 MG tablet Take 1 tablet (5 mg total) by mouth daily. 90 tablet 3   SUMAtriptan (IMITREX) 25 MG tablet Take 25-50 mg po at migraine onset, and may repeat dose in 2 hours as needed for unresolved HA, Maximum daily dose 200 mg in 24 hours 20 tablet 3   terbinafine (LAMISIL) 250 MG tablet Take 1 tablet (250 mg total) by mouth daily. 90 tablet 2   No facility-administered medications prior to visit.    Allergies  Allergen Reactions   Penicillins  Other (See Comments)    "lose my hearing"    ROS Review of Systems  Constitutional:  Negative for chills and fever.  Eyes:  Negative for visual disturbance.  Respiratory:  Negative for cough, chest tightness, shortness of breath and wheezing.   Cardiovascular:  Negative for chest pain and palpitations.  Gastrointestinal:  Negative for abdominal pain, nausea and vomiting.  Skin: Negative.   Neurological:  Positive for light-headedness. Negative for dizziness, weakness and headaches.     Objective:    Physical Exam Constitutional:      Appearance: Normal appearance.  HENT:     Head: Normocephalic and atraumatic.  Eyes:     Conjunctiva/sclera: Conjunctivae  normal.  Cardiovascular:     Rate and Rhythm: Normal rate and regular rhythm.  Pulmonary:     Effort: Pulmonary effort is normal.     Breath sounds: Normal breath sounds. No wheezing, rhonchi or rales.  Musculoskeletal:        General: Swelling present.     Comments: Mild chronic bilateral lower extremity nonpitting edema present  Skin:    General: Skin is warm and dry.  Neurological:     General: No focal deficit present.     Mental Status: She is alert. Mental status is at baseline.  Psychiatric:        Mood and Affect: Mood normal.        Behavior: Behavior normal.    BP 118/60   Pulse 99   Temp 98 F (36.7 C)   Resp 16   Ht _0  (1.575 m)   Wt 248 lb 11.2 oz (112.8 kg)   SpO2 97%   BMI 45.49 kg/m  Wt Readings from Last 3 Encounters:  09/24/20 251 lb 3.2 oz (113.9 kg)  08/16/20 257 lb 1.6 oz (116.6 kg)  08/02/20 258 lb 6.4 oz (117.2 kg)     Health Maintenance Due  Topic Date Due   COVID-19 Vaccine (1) Never done   Pneumococcal Vaccine 88-30 Years old (1 - PCV) Never done   Zoster Vaccines- Shingrix (1 of 2) Never done   OPHTHALMOLOGY EXAM  09/16/2020    There are no preventive care reminders to display for this patient.  Lab Results  Component Value Date   TSH 1.940 08/02/2018   Lab Results   Component Value Date   WBC 9.0 08/02/2020   HGB 12.5 08/02/2020   HCT 38.5 08/02/2020   MCV 89 08/02/2020   PLT 321 08/02/2020   Lab Results  Component Value Date   NA 144 09/24/2020   K 4.2 09/24/2020   CO2 27 09/24/2020   GLUCOSE 164 (H) 09/24/2020   BUN 19 09/24/2020   CREATININE 0.86 09/24/2020   BILITOT 0.8 09/24/2020   ALKPHOS 101 09/24/2020   AST 26 09/24/2020   ALT 34 (H) 09/24/2020   PROT 6.7 09/24/2020   ALBUMIN 4.6 09/24/2020   CALCIUM 9.5 09/24/2020   EGFR 76 09/24/2020   Lab Results  Component Value Date   CHOL 115 05/07/2020   Lab Results  Component Value Date   HDL 44 05/07/2020   Lab Results  Component Value Date   LDLCALC 47 05/07/2020   Lab Results  Component Value Date   TRIG 142 05/07/2020   Lab Results  Component Value Date   CHOLHDL 2.6 05/07/2020   Lab Results  Component Value Date   HGBA1C 6.4 (H) 09/24/2020      Assessment & Plan:   1. Essential hypertension: Stable.  Does not like she is having some orthostatic changes, especially with position changes add physical therapy.  I will refill her hydrochlorothiazide at its current dose today, however she will continue to check her blood pressure and if she has low blood pressures well symptomatic she will call our office and let us know for reduction in dose. Follow up in 6 months for recheck.   - hydrochlorothiazide (HYDRODIURIL) 12.5 MG tablet; Take 1 tablet (12.5 mg total) by mouth daily.  Dispense: 90 tablet; Refill: 0  2. Class 3 severe obesity due to excess calories without serious comorbidity with body mass index (BMI) of 45.0 to 49.9 in adult ([facility])/Dyslipidemia: Stable, continue statin.   3.  Type 2 diabetes mellitus without complication, without long-term current use of insulin ([place]): Stable, continue Metformin 1000 mg BID and working on lifestyle. Recheck A1c at next visit.   4. Onychomycosis: Continue Lamisil for another 2 months.  We will recheck a CMP today and in another  month to monitor LFTs while on this medication.  She was advised not to take this medication for more than 90 days at a time.  - Comprehensive Metabolic Panel (CMET) - Comprehensive Metabolic Panel (CMET)  Follow-up: Return in about 6 months (around 05/06/2021).    Teodora Medici, DO

## 2020-11-04 NOTE — Therapy (Signed)
[facility] Physical Therapy [address] [place], [place], [address] Phone: [phone]   Fax:  [phone]  Physical Therapy Treatment  Patient Details  Name: Tina Stephenson MRN: [id number] Date of Birth: 21-Jun-1955 Referring Provider (PT): Dwana Melena, [place]   Encounter Date: 11/04/2020   PT End of Session - 11/04/20 1150     Visit Number 2    Number of Visits 24    Date for PT Re-Evaluation 01/07/21    Authorization Type [employer] Managed    Authorization Time Period 20% CO-INSURANCE, DED MET, 60 DAYS PER CALENDER YEAR AFTER 5TH VISIT, MEDICAL NESSITY NEEDED/REF#1980    Progress Note Due on Visit 10    PT Start Time 1013    PT Stop Time 1059    PT Time Calculation (min) 46 min    Activity Tolerance Patient limited by pain    Behavior During Therapy [facility] for tasks assessed/performed             Past Medical History:  Diagnosis Date   Bronchitis    Depression    Diabetes mellitus without complication ([place])    Gallbladder polyp    GERD (gastroesophageal reflux disease)    Hypertension    Hypertensive disorder 03/05/2015   Liver tumor (benign)    Sacrum and coccyx fracture ([place])    Sciatica     Past Surgical History:  Procedure Laterality Date   APPENDECTOMY     NASAL SEPTUM SURGERY     WISDOM TOOTH EXTRACTION      There were no vitals filed for this visit.   Subjective Assessment - 11/04/20 1014     Subjective shoulder is sore today    Pertinent History DM2, cervical herniated disc, scoliosis, obesity, HTN,  pt reports lumbar & cervical fractures,    Patient Stated Goals To move dominant arm, no pain, full range, return to work    Currently in Pain? Yes    Pain Score 6     Pain Location Shoulder    Pain Orientation Right;Lateral;Anterior    Pain Descriptors / Indicators Sharp;Sore    Pain Type Surgical pain    Pain Onset 1 to 4 weeks ago    Pain Frequency Intermittent    Aggravating Factors  moving too far like in the shower    Pain  Relieving Factors OTC tylenol, ice                               [facility] Adult PT Treatment/Exercise - 11/04/20 1045       Shoulder Exercises: ROM/Strengthening   Pendulum circles CW/CCW, fwd/bwd, lateral x 20 reps each RUUE - min lightheadedness with circles so seated rest needed    Other ROM/Strengthening Exercises reviewed grip squeeze, elbow AROM and scapular retraction to continue at home      Modalities   Modalities Vasopneumatic      Vasopneumatic   Number Minutes Vasopneumatic  10 minutes    Vasopnuematic Location  Shoulder    Vasopneumatic Pressure Low    Vasopneumatic Temperature  34      Manual Therapy   Manual therapy comments Rt shoulder PROM to tolerance (<90 deg flex/abdct and ~ 10 deg external rotation)                     PT Education - 11/04/20 1150     Education Details HEP    Person(s) Educated Patient    Methods Explanation;Demonstration;Handout  Comprehension Verbalized understanding;Returned demonstration;Need further instruction              PT Short Term Goals - 11/03/20 1540       PT SHORT TERM GOAL #1   Title FOTO improved to 35%    Time 4    Period Weeks    Status New    Target Date 11/23/20      PT SHORT TERM GOAL #2   Title right shoulder PROM flexion & abduction 100*    Time 4    Period Weeks    Status New    Target Date 11/23/20      PT SHORT TERM GOAL #3   Title Patient is independent with initial HEP    Time 4    Period Weeks    Status New    Target Date 11/23/20               PT Long Term Goals - 11/03/20 1541       PT LONG TERM GOAL #1   Title Patient reports pain in right shoulder </= 2/10 with activities    Time 10    Period Weeks    Status New    Target Date 01/07/21      PT LONG TERM GOAL #2   Title FOTO >/= 55%    Time 10    Period Weeks    Status New    Target Date 01/07/21      PT LONG TERM GOAL #3   Title Right shoulder AROM Flexion & abduction 120* and  external & internal rotation 60*    Time 10    Period Weeks    Status New    Target Date 01/07/21      PT LONG TERM GOAL #4   Title Right shoulder strength >/= 4/5    Time 10    Period Weeks    Status New    Target Date 01/07/21                   Plan - 11/04/20 1151     Clinical Impression Statement Session today focused on establishing HEP and PROM to Rt shoulder.  Pt is still guarded with PROM with pain noted which improved over time.  She will continue to benefit from PT to maximize function.    Personal Factors and Comorbidities Comorbidity 3+    Comorbidities DM2, cervical herniated disc, scoliosis, obesity, HTN,    Examination-Activity Limitations Lift;Carry;Reach Overhead;Sleep    Examination-Participation Restrictions Occupation;Yard Work    Stability/Clinical Decision Making Stable/Uncomplicated    Rehab Potential Good    PT Frequency 3x / week   3x/wk for 4 weeks, then 2x/wk for 6 weeks   PT Duration Other (comment)   10 weeks   PT Treatment/Interventions ADLs/Self Care Home Management;Cryotherapy;Electrical Stimulation;Moist Heat;Functional mobility training;Therapeutic activities;Therapeutic exercise;Balance training;Neuromuscular re-education;Patient/family education;Manual techniques;Scar mobilization;Passive range of motion;Vasopneumatic Device;Joint Manipulations    PT Next Visit Plan HEP, PROM & manual therapy to increase ROM    PT Home Exercise Plan Access Code: [email or web address]    Consulted and Agree with Plan of Care Patient             Patient will benefit from skilled therapeutic intervention in order to improve the following deficits and impairments:  Decreased coordination, Decreased endurance, Decreased range of motion, Decreased strength, Increased edema, Increased muscle spasms, Postural dysfunction, Obesity, Pain  Visit Diagnosis: Stiffness of right shoulder, not elsewhere classified  Acute pain of right  shoulder  Muscle weakness  (generalized)  Abnormal posture  Localized edema     Problem List Patient Active Problem List   Diagnosis Date Noted   Traumatic tear of supraspinatus tendon of right shoulder 09/24/2020   Tendinopathy of right biceps tendon 09/24/2020   Traumatic tear of supraspinatus tendon of left shoulder 09/24/2020   Leg swelling 08/02/2020   Dyslipidemia 01/05/2020   Cervical polyp 08/22/2018   Nocturia 08/22/2018   Intertrigo 08/22/2018   Type 2 diabetes mellitus without complication, without long-term current use of insulin ([place]) 08/22/2018   Cervical spine arthritis with nerve pain 06/25/2018   Herniated disc, cervical 06/25/2018   History of migraine 03/25/2018   Scoliosis 03/25/2018   History of cyst of breast 03/25/2018   Sacrum and coccyx fracture, sequela 02/21/2018   Class 3 severe obesity due to excess calories without serious comorbidity with body mass index (BMI) of 45.0 to 49.9 in adult [facility]) 02/21/2018   Cough 02/21/2018   Onychomycosis 02/21/2018   History of hypertension 02/21/2018   Vitamin D deficiency 03/19/2015   Essential hypertension 03/05/2015      Laureen Abrahams, PT, DPT 11/04/20 11:53 AM     [facility] Physical Therapy [address] [place], [place], [address] Phone: [phone]   Fax:  [phone]  Name: Violett Hobbs MRN: [id number] Date of Birth: 29-Mar-1955

## 2020-11-05 ENCOUNTER — Encounter: Payer: Self-pay | Admitting: Physical Therapy

## 2020-11-05 ENCOUNTER — Encounter: Payer: Self-pay | Admitting: Internal Medicine

## 2020-11-05 ENCOUNTER — Ambulatory Visit (INDEPENDENT_AMBULATORY_CARE_PROVIDER_SITE_OTHER): Payer: Managed Care, Other (non HMO) | Admitting: Physical Therapy

## 2020-11-05 ENCOUNTER — Other Ambulatory Visit: Payer: Self-pay

## 2020-11-05 ENCOUNTER — Ambulatory Visit: Payer: Managed Care, Other (non HMO) | Admitting: Internal Medicine

## 2020-11-05 VITALS — BP 118/60 | HR 99 | Temp 98.0°F | Resp 16 | Ht 62.0 in | Wt 248.7 lb

## 2020-11-05 DIAGNOSIS — I10 Essential (primary) hypertension: Secondary | ICD-10-CM

## 2020-11-05 DIAGNOSIS — E119 Type 2 diabetes mellitus without complications: Secondary | ICD-10-CM | POA: Diagnosis not present

## 2020-11-05 DIAGNOSIS — E785 Hyperlipidemia, unspecified: Secondary | ICD-10-CM | POA: Diagnosis not present

## 2020-11-05 DIAGNOSIS — M25611 Stiffness of right shoulder, not elsewhere classified: Secondary | ICD-10-CM | POA: Diagnosis not present

## 2020-11-05 DIAGNOSIS — Z6841 Body Mass Index (BMI) 40.0 and over, adult: Secondary | ICD-10-CM

## 2020-11-05 DIAGNOSIS — M6281 Muscle weakness (generalized): Secondary | ICD-10-CM | POA: Diagnosis not present

## 2020-11-05 DIAGNOSIS — Z5181 Encounter for therapeutic drug level monitoring: Secondary | ICD-10-CM

## 2020-11-05 DIAGNOSIS — R293 Abnormal posture: Secondary | ICD-10-CM | POA: Diagnosis not present

## 2020-11-05 DIAGNOSIS — M25511 Pain in right shoulder: Secondary | ICD-10-CM

## 2020-11-05 DIAGNOSIS — R600 Localized edema: Secondary | ICD-10-CM

## 2020-11-05 DIAGNOSIS — B351 Tinea unguium: Secondary | ICD-10-CM

## 2020-11-05 MED ORDER — HYDROCHLOROTHIAZIDE 12.5 MG PO TABS: 12.5000 mg | ORAL_TABLET | Freq: Every day | ORAL | 0 refills | Status: DC

## 2020-11-05 NOTE — Therapy (Addendum)
[facility] Physical Therapy [address] [place], [place], [address] Phone: [phone]   Fax:  [phone]  Physical Therapy Treatment/DC  Patient Details  Name: Tina Stephenson MRN: [id number] Date of Birth: 08/27/1955 Referring Provider (PT): Dwana Melena, [place]  PHYSICAL THERAPY DISCHARGE SUMMARY  Visits from Start of Care: 3  Current functional level related to goals / functional outcomes: See note   Remaining deficits: See note   Education / Equipment: HEP   Patient agrees to discharge. Patient goals were partially met. Patient is being discharged due to  transferring to [facility] (closer to home).   Encounter Date: 11/05/2020   PT End of Session - 11/05/20 0957     Visit Number 3    Number of Visits 24    Date for PT Re-Evaluation 01/07/21    Authorization Type [employer] Managed    Authorization Time Period 20% CO-INSURANCE, DED MET, 60 DAYS PER CALENDER YEAR AFTER 5TH VISIT, MEDICAL NESSITY NEEDED/REF#1980    Progress Note Due on Visit 10    PT Start Time 0927    PT Stop Time 1007    PT Time Calculation (min) 40 min    Activity Tolerance Patient limited by pain    Behavior During Therapy [facility] for tasks assessed/performed             Past Medical History:  Diagnosis Date   Bronchitis    Depression    Diabetes mellitus without complication ([place])    Gallbladder polyp    GERD (gastroesophageal reflux disease)    Hypertension    Hypertensive disorder 03/05/2015   Liver tumor (benign)    Sacrum and coccyx fracture ([place])    Sciatica     Past Surgical History:  Procedure Laterality Date   APPENDECTOMY     NASAL SEPTUM SURGERY     WISDOM TOOTH EXTRACTION      There were no vitals filed for this visit.   Subjective Assessment - 11/05/20 0928     Subjective shoulder is a little sore, will transfer to [facility] next week.    Pertinent History DM2, cervical herniated disc, scoliosis, obesity, HTN,  pt reports lumbar & cervical fractures,     Patient Stated Goals To move dominant arm, no pain, full range, return to work    Currently in Pain? Yes    Pain Score 5     Pain Location Shoulder    Pain Orientation Right;Lateral;Anterior    Pain Descriptors / Indicators Aching;Sore;Sharp    Pain Type Surgical pain    Pain Onset 1 to 4 weeks ago    Pain Frequency Intermittent    Aggravating Factors  moving too far    Pain Relieving Factors OTC tylenol                               [facility] Adult PT Treatment/Exercise - 11/05/20 0931       Shoulder Exercises: ROM/Strengthening   Pendulum circles CW/CCW, fwd/bwd, lateral x 20 reps each RUE      Modalities   Modalities Vasopneumatic      Vasopneumatic   Number Minutes Vasopneumatic  10 minutes    Vasopnuematic Location  Shoulder    Vasopneumatic Pressure Low    Vasopneumatic Temperature  34      Manual Therapy   Manual therapy comments Rt shoulder PROM to tolerance (<90 deg flex/abdct and ~ 10 deg external rotation)  PT Short Term Goals - 11/03/20 1540       PT SHORT TERM GOAL #1   Title FOTO improved to 35%    Time 4    Period Weeks    Status New    Target Date 11/23/20      PT SHORT TERM GOAL #2   Title right shoulder PROM flexion & abduction 100*    Time 4    Period Weeks    Status New    Target Date 11/23/20      PT SHORT TERM GOAL #3   Title Patient is independent with initial HEP    Time 4    Period Weeks    Status New    Target Date 11/23/20               PT Long Term Goals - 11/03/20 1541       PT LONG TERM GOAL #1   Title Patient reports pain in right shoulder </= 2/10 with activities    Time 10    Period Weeks    Status New    Target Date 01/07/21      PT LONG TERM GOAL #2   Title FOTO >/= 55%    Time 10    Period Weeks    Status New    Target Date 01/07/21      PT LONG TERM GOAL #3   Title Right shoulder AROM Flexion & abduction 120* and external & internal rotation 60*     Time 10    Period Weeks    Status New    Target Date 01/07/21      PT LONG TERM GOAL #4   Title Right shoulder strength >/= 4/5    Time 10    Period Weeks    Status New    Target Date 01/07/21                   Plan - 11/05/20 0957     Clinical Impression Statement Pt tolerated session well today with continued work on PROM.  Will continue to benefit from PT to maximize function.    Personal Factors and Comorbidities Comorbidity 3+    Comorbidities DM2, cervical herniated disc, scoliosis, obesity, HTN,    Examination-Activity Limitations Lift;Carry;Reach Overhead;Sleep    Examination-Participation Restrictions Occupation;Yard Work    Stability/Clinical Decision Making Stable/Uncomplicated    Rehab Potential Good    PT Frequency 3x / week   3x/wk for 4 weeks, then 2x/wk for 6 weeks   PT Duration Other (comment)   10 weeks   PT Treatment/Interventions ADLs/Self Care Home Management;Cryotherapy;Electrical Stimulation;Moist Heat;Functional mobility training;Therapeutic activities;Therapeutic exercise;Balance training;Neuromuscular re-education;Patient/family education;Manual techniques;Scar mobilization;Passive range of motion;Vasopneumatic Device;Joint Manipulations    PT Next Visit Plan HEP, PROM & manual therapy to increase ROM; check measurements    PT Home Exercise Plan Access Code: [email or web address]    Consulted and Agree with Plan of Care Patient             Patient will benefit from skilled therapeutic intervention in order to improve the following deficits and impairments:  Decreased coordination, Decreased endurance, Decreased range of motion, Decreased strength, Increased edema, Increased muscle spasms, Postural dysfunction, Obesity, Pain  Visit Diagnosis: Stiffness of right shoulder, not elsewhere classified  Acute pain of right shoulder  Muscle weakness (generalized)  Abnormal posture  Localized edema     Problem List Patient Active Problem List    Diagnosis Date Noted   Traumatic tear of supraspinatus  tendon of right shoulder 09/24/2020   Tendinopathy of right biceps tendon 09/24/2020   Traumatic tear of supraspinatus tendon of left shoulder 09/24/2020   Leg swelling 08/02/2020   Dyslipidemia 01/05/2020   Cervical polyp 08/22/2018   Nocturia 08/22/2018   Intertrigo 08/22/2018   Type 2 diabetes mellitus without complication, without long-term current use of insulin ([place]) 08/22/2018   Cervical spine arthritis with nerve pain 06/25/2018   Herniated disc, cervical 06/25/2018   History of migraine 03/25/2018   Scoliosis 03/25/2018   History of cyst of breast 03/25/2018   Sacrum and coccyx fracture, sequela 02/21/2018   Class 3 severe obesity due to excess calories without serious comorbidity with body mass index (BMI) of 45.0 to 49.9 in adult [facility]) 02/21/2018   Cough 02/21/2018   Onychomycosis 02/21/2018   History of hypertension 02/21/2018   Vitamin D deficiency 03/19/2015   Essential hypertension 03/05/2015      Laureen Abrahams, PT, DPT 11/05/20 10:00 AM   Farley Ly PT, MPT   [facility] Physical Therapy [address] [place], [place], [address] Phone: [phone]   Fax:  [phone]  Name: Nikolette Reindl MRN: [id number] Date of Birth: 1955-11-27

## 2020-11-05 NOTE — Patient Instructions (Addendum)
It was great seeing you today!  Plan discussed at today's visit: -Blood work ordered today, results will be uploaded to [facility].  -Please obtain blood work today to check liver enzymes and again in 1 month  Follow up in: 6 months or sooner as needed.  Take care and let us know if you have any questions or concerns prior to your next visit.  Dr. Rosana Berger

## 2020-11-08 ENCOUNTER — Ambulatory Visit: Payer: Managed Care, Other (non HMO) | Attending: Physician Assistant

## 2020-11-08 ENCOUNTER — Encounter: Payer: Managed Care, Other (non HMO) | Admitting: Physical Therapy

## 2020-11-08 DIAGNOSIS — M25511 Pain in right shoulder: Secondary | ICD-10-CM | POA: Diagnosis present

## 2020-11-08 DIAGNOSIS — M25611 Stiffness of right shoulder, not elsewhere classified: Secondary | ICD-10-CM | POA: Diagnosis present

## 2020-11-08 DIAGNOSIS — M6281 Muscle weakness (generalized): Secondary | ICD-10-CM | POA: Diagnosis present

## 2020-11-08 NOTE — Therapy (Signed)
[place] [facility] [address], [place], [address] Phone: [phone]   Fax:  [phone]  Physical Therapy Treatment  Patient Details  Name: Tina Stephenson MRN: [id number] Date of Birth: 02-Oct-1955 Referring Provider (PT): Dwana Melena, [place]   Encounter Date: 11/08/2020   PT End of Session - 11/08/20 0958     Visit Number 4    Number of Visits 24    Date for PT Re-Evaluation 01/07/21    Authorization Type [employer] Managed    Authorization Time Period 20% CO-INSURANCE, DED MET, 60 DAYS PER CALENDER YEAR AFTER 5TH VISIT, MEDICAL NESSITY NEEDED/REF#1980    Progress Note Due on Visit 10    PT Start Time 0933    PT Stop Time 1016    PT Time Calculation (min) 43 min    Activity Tolerance Patient limited by pain    Behavior During Therapy [facility] for tasks assessed/performed             Past Medical History:  Diagnosis Date   Bronchitis    Depression    Diabetes mellitus without complication ([place])    Gallbladder polyp    GERD (gastroesophageal reflux disease)    Hypertension    Hypertensive disorder 03/05/2015   Liver tumor (benign)    Sacrum and coccyx fracture ([place])    Sciatica     Past Surgical History:  Procedure Laterality Date   APPENDECTOMY     NASAL SEPTUM SURGERY     WISDOM TOOTH EXTRACTION      There were no vitals filed for this visit.   Subjective Assessment - 11/08/20 0934     Subjective R shoulder is getting better but accidentaly twistied it this morning. 5-6/10 currently.    Pertinent History DM2, cervical herniated disc, scoliosis, obesity, HTN,  pt reports lumbar & cervical fractures,    Patient Stated Goals To move dominant arm, no pain, full range, return to work    Currently in Pain? Yes    Pain Score 5     Pain Onset 1 to 4 weeks ago                                        PT Education - 11/08/20 1014     Education Details ther-ex, PROM     Person(s) Educated Patient    Methods Explanation;Tactile cues;Verbal cues    Comprehension Verbalized understanding;Returned demonstration            Objectives  Therapeutic exercises  Supine PROM   Flexion 10x3  Abduction 10x3  ER (at neutral) 10x3  Scapular retraction 10x3 with 5 second holds   Improved exercise technique, movement at target joints, PROM after mod verbal, visual, tactile cues.    Ice R shoulder for 15 minutes after exercises     Response to treatment Pt tolerated session well without aggravation of symptoms.    Clinical impression Pt currently 3 weeks post op. Continued working on PROM based on [facility] [facility] protocol utilized. Initial stiffness with PROM but improved with repetition and cues to relax muscles. Pt tolerated session well without aggravation of symptoms. Pt will benefit from continued skilled physical therapy services to improve ROM, strength and function.      PT Short Term Goals - 11/03/20 1540       PT SHORT TERM GOAL #1   Title FOTO improved to  35%    Time 4    Period Weeks    Status New    Target Date 11/23/20      PT SHORT TERM GOAL #2   Title right shoulder PROM flexion & abduction 100*    Time 4    Period Weeks    Status New    Target Date 11/23/20      PT SHORT TERM GOAL #3   Title Patient is independent with initial HEP    Time 4    Period Weeks    Status New    Target Date 11/23/20               PT Long Term Goals - 11/03/20 1541       PT LONG TERM GOAL #1   Title Patient reports pain in right shoulder </= 2/10 with activities    Time 10    Period Weeks    Status New    Target Date 01/07/21      PT LONG TERM GOAL #2   Title FOTO >/= 55%    Time 10    Period Weeks    Status New    Target Date 01/07/21      PT LONG TERM GOAL #3   Title Right shoulder AROM Flexion & abduction 120* and external & internal rotation 60*    Time 10    Period Weeks    Status New     Target Date 01/07/21      PT LONG TERM GOAL #4   Title Right shoulder strength >/= 4/5    Time 10    Period Weeks    Status New    Target Date 01/07/21                   Plan - 11/08/20 2426     Clinical Impression Statement Pt currently 3 weeks post op. Continued working on PROM based on [place] specialists protocol utilized. Initial stiffness with PROM but improved with repetition and cues to relax muscles. Pt tolerated session well without aggravation of symptoms. Pt will benefit from continued skilled physical therapy services to improve ROM, strength and function.    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, jewellery Low    Rehab Potential Good    PT Frequency 3x / week   3x/wk for 4 weeks, then 2x/wk for 6 weeks   PT Duration Other (comment)   10 weeks   PT Treatment/Interventions ADLs/Self Care Home Management;Cryotherapy;Electrical Stimulation;Moist Heat;Functional mobility training;Therapeutic activities;Therapeutic exercise;Balance training;Neuromuscular re-education;Patient/family education;Manual techniques;Scar mobilization;Passive range of motion;Vasopneumatic Device;Joint Manipulations    PT Next Visit Plan HEP, PROM & manual therapy to increase ROM; check measurements    PT Home Exercise Plan Access Code: [email or web address]    Consulted and Agree with Plan of Care Patient             Patient will benefit from skilled therapeutic intervention in order to improve the following deficits and impairments:  Decreased coordination, Decreased endurance, Decreased range of motion, Decreased strength, Increased edema, Increased muscle spasms, Postural dysfunction, Obesity,  Pain  Visit Diagnosis: Stiffness of right shoulder, not elsewhere classified  Acute pain of right shoulder  Muscle weakness (generalized)     Problem List Patient Active Problem List  Diagnosis Date Noted   Traumatic tear of supraspinatus tendon of right shoulder 09/24/2020   Tendinopathy of right biceps tendon 09/24/2020   Traumatic tear of supraspinatus tendon of left shoulder 09/24/2020   Leg swelling 08/02/2020   Dyslipidemia 01/05/2020   Cervical polyp 08/22/2018   Nocturia 08/22/2018   Intertrigo 08/22/2018   Type 2 diabetes mellitus without complication, without long-term current use of insulin ([place]) 08/22/2018   Cervical spine arthritis with nerve pain 06/25/2018   Herniated disc, cervical 06/25/2018   History of migraine 03/25/2018   Scoliosis 03/25/2018   History of cyst of breast 03/25/2018   Sacrum and coccyx fracture, sequela 02/21/2018   Class 3 severe obesity due to excess calories without serious comorbidity with body mass index (BMI) of 45.0 to 49.9 in adult [facility]) 02/21/2018   Cough 02/21/2018   Onychomycosis 02/21/2018   History of hypertension 02/21/2018   Vitamin D deficiency 03/19/2015   Essential hypertension 03/05/2015    Joneen Boers PT, DPT   11/08/2020, 1:21 PM  [place] [facility] [address] [place], [address] Phone: [phone]   Fax:  [phone]  Name: Tina Stephenson MRN: [id number] Date of Birth: 02-10-1955

## 2020-11-09 ENCOUNTER — Encounter: Payer: Managed Care, Other (non HMO) | Admitting: Physical Therapy

## 2020-11-09 LAB — COMPREHENSIVE METABOLIC PANEL
ALT: 34 IU/L — ABNORMAL HIGH (ref 0–32)
AST: 33 IU/L (ref 0–40)
Albumin/Globulin Ratio: 2.5 — ABNORMAL HIGH (ref 1.2–2.2)
Albumin: 4.9 g/dL — ABNORMAL HIGH (ref 3.8–4.8)
Alkaline Phosphatase: 92 IU/L (ref 44–121)
BUN/Creatinine Ratio: 24 (ref 12–28)
BUN: 19 mg/dL (ref 8–27)
Bilirubin Total: 0.9 mg/dL (ref 0.0–1.2)
CO2: 27 mmol/L (ref 20–29)
Calcium: 10.1 mg/dL (ref 8.7–10.3)
Chloride: 101 mmol/L (ref 96–106)
Creatinine, Ser: 0.8 mg/dL (ref 0.57–1.00)
Globulin, Total: 2 g/dL (ref 1.5–4.5)
Glucose: 102 mg/dL — ABNORMAL HIGH (ref 70–99)
Potassium: 4.7 mmol/L (ref 3.5–5.2)
Sodium: 141 mmol/L (ref 134–144)
Total Protein: 6.9 g/dL (ref 6.0–8.5)
eGFR: 83 mL/min/{1.73_m2} (ref >59)

## 2020-11-10 ENCOUNTER — Ambulatory Visit: Payer: Managed Care, Other (non HMO) | Attending: Physician Assistant

## 2020-11-10 ENCOUNTER — Telehealth: Payer: Self-pay

## 2020-11-10 DIAGNOSIS — M6281 Muscle weakness (generalized): Secondary | ICD-10-CM | POA: Insufficient documentation

## 2020-11-10 DIAGNOSIS — M25511 Pain in right shoulder: Secondary | ICD-10-CM | POA: Insufficient documentation

## 2020-11-10 DIAGNOSIS — M25611 Stiffness of right shoulder, not elsewhere classified: Secondary | ICD-10-CM | POA: Insufficient documentation

## 2020-11-10 DIAGNOSIS — R600 Localized edema: Secondary | ICD-10-CM | POA: Insufficient documentation

## 2020-11-10 DIAGNOSIS — R293 Abnormal posture: Secondary | ICD-10-CM | POA: Insufficient documentation

## 2020-11-10 NOTE — Therapy (Signed)
[facility] [address] [place], [place], [address] Phone: [phone]   Fax:  [phone]  Physical Therapy Treatment  Patient Details  Name: Tina Stephenson MRN: [id number] Date of Birth: 07/14/1955 Referring Provider (PT): Mary Stanbery, PA   Encounter Date: 11/10/2020   PT End of Session - 11/10/20 0908     Visit Number 5    Number of Visits 24    Date for PT Re-Evaluation 01/07/21    Authorization Type [employer] Managed    Authorization Time Period 20% CO-INSURANCE, DED MET, 60 DAYS PER CALENDER YEAR AFTER 5TH VISIT, MEDICAL NESSITY NEEDED/REF#1980    Progress Note Due on Visit 10    PT Start Time 0908   Pt arrived late   PT Stop Time 0948    PT Time Calculation (min) 40 min    Activity Tolerance Patient limited by pain    Behavior During Therapy WFL for tasks assessed/performed             Past Medical History:  Diagnosis Date   Bronchitis    Depression    Diabetes mellitus without complication (HCC)    Gallbladder polyp    GERD (gastroesophageal reflux disease)    Hypertension    Hypertensive disorder 03/05/2015   Liver tumor (benign)    Sacrum and coccyx fracture (HCC)    Sciatica     Past Surgical History:  Procedure Laterality Date   APPENDECTOMY     NASAL SEPTUM SURGERY     WISDOM TOOTH EXTRACTION      There were no vitals filed for this visit.   Subjective Assessment - 11/10/20 0910     Subjective 4/10 Pain this morning, Also feels a tight band around her R proximal arm.    Pertinent History DM2, cervical herniated disc, scoliosis, obesity, HTN,  pt reports lumbar & cervical fractures,    Patient Stated Goals To move dominant arm, no pain, full range, return to work    Currently in Pain? Yes    Pain Score 4     Pain Onset 1 to 4 weeks ago                                        PT Education - 11/10/20 1249     Education Details PROM    Person(s)  Educated Patient    Methods Explanation;Verbal cues    Comprehension Verbalized understanding;Returned demonstration            Objectives   Therapeutic exercises   Supine PROM              Flexion 10x3             Abduction 10x3             ER (at neutral) 10x3   Scapular retraction 10x3 with 5 second holds    Improved exercise technique, movement at target joints, PROM after mod verbal, visual, tactile cues.      Ice R shoulder for 15 minutes after exercises         Response to treatment Pt tolerated session well without aggravation of symptoms.      Clinical impression Stiff end feel. Continued PROM exercises per protocol. Pt tolerated session well without aggravation of symptoms. Pt will benefit from continued skilled physical therapy services to improve ROM, strength and function.        PT Short Term Goals - 11/03/20 1540       PT SHORT TERM GOAL #1   Title FOTO improved to 35%    Time 4    Period Weeks    Status New    Target Date 11/23/20      PT SHORT TERM GOAL #2   Title right shoulder PROM flexion & abduction 100*    Time 4    Period Weeks    Status New    Target Date 11/23/20      PT SHORT TERM GOAL #3   Title Patient is independent with initial HEP    Time 4    Period Weeks    Status New    Target Date 11/23/20               PT Long Term Goals - 11/03/20 1541       PT LONG TERM GOAL #1   Title Patient reports pain in right shoulder </= 2/10 with activities    Time 10    Period Weeks    Status New    Target Date 01/07/21      PT LONG TERM GOAL #2   Title FOTO >/= 55%    Time 10    Period Weeks    Status New    Target Date 01/07/21      PT LONG TERM GOAL #3   Title Right shoulder AROM Flexion & abduction 120* and external & internal rotation 60*    Time 10    Period Weeks    Status New    Target Date 01/07/21      PT LONG TERM GOAL #4   Title Right shoulder strength >/= 4/5    Time 10    Period Weeks    Status  New    Target Date 01/07/21                   Plan - 11/10/20 3154     Clinical Impression Statement Stiff end feel. Continued PROM exercises per protocol. Pt tolerated session well without aggravation of symptoms. Pt will benefit from continued skilled physical therapy services to improve ROM, strength and function.    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, jewellery Low    Rehab Potential Good    PT Frequency 3x / week   3x/wk for 4 weeks, then 2x/wk for 6 weeks   PT Duration Other (comment)   10 weeks   PT Treatment/Interventions ADLs/Self Care Home Management;Cryotherapy;Electrical Stimulation;Moist Heat;Functional mobility training;Therapeutic activities;Therapeutic exercise;Balance training;Neuromuscular re-education;Patient/family education;Manual techniques;Scar mobilization;Passive range of motion;Vasopneumatic Device;Joint Manipulations    PT Next Visit Plan HEP, PROM & manual therapy to increase ROM; check measurements    PT Home Exercise Plan Access Code: [email or web address]    Consulted and Agree with Plan of Care Patient             Patient will benefit from skilled therapeutic intervention in order to improve the following deficits and impairments:  Decreased coordination, Decreased endurance, Decreased range of motion, Decreased strength, Increased edema, Increased muscle spasms, Postural dysfunction, Obesity, Pain  Visit Diagnosis: Stiffness of right shoulder, not elsewhere classified  Acute pain of right shoulder  Muscle weakness (generalized)  Abnormal posture  Localized edema  Problem List Patient Active Problem List   Diagnosis Date Noted   Traumatic tear of  supraspinatus tendon of right shoulder 09/24/2020   Tendinopathy of right biceps tendon 09/24/2020   Traumatic tear of supraspinatus tendon of left shoulder 09/24/2020   Leg swelling 08/02/2020   Dyslipidemia 01/05/2020   Cervical polyp 08/22/2018   Nocturia 08/22/2018   Intertrigo 08/22/2018   Type 2 diabetes mellitus without complication, without long-term current use of insulin (HCC) 08/22/2018   Cervical spine arthritis with nerve pain 06/25/2018   Herniated disc, cervical 06/25/2018   History of migraine 03/25/2018   Scoliosis 03/25/2018   History of cyst of breast 03/25/2018   Sacrum and coccyx fracture, sequela 02/21/2018   Class 3 severe obesity due to excess calories without serious comorbidity with body mass index (BMI) of 45.0 to 49.9 in adult (HCC) 02/21/2018   Cough 02/21/2018   Onychomycosis 02/21/2018   History of hypertension 02/21/2018   Vitamin D deficiency 03/19/2015   Essential hypertension 03/05/2015    Miguel Laygo PT, DPT   11/10/2020, 12:52 PM  [facility] [address] [place], [place], [address] Phone: [phone]   Fax:  [phone]  Name: Tina Stephenson MRN: [id number] Date of Birth: 04/17/1955    

## 2020-11-10 NOTE — Telephone Encounter (Signed)
No show. Called pt and left a message pertaining to today's session. Also offered the 11:45 am availability today. Return phone call requested. Phone number [phone]) provided.

## 2020-11-11 ENCOUNTER — Encounter: Payer: Managed Care, Other (non HMO) | Admitting: Physical Therapy

## 2020-11-11 ENCOUNTER — Ambulatory Visit: Payer: Managed Care, Other (non HMO)

## 2020-11-11 DIAGNOSIS — R600 Localized edema: Secondary | ICD-10-CM

## 2020-11-11 DIAGNOSIS — M6281 Muscle weakness (generalized): Secondary | ICD-10-CM

## 2020-11-11 DIAGNOSIS — M25511 Pain in right shoulder: Secondary | ICD-10-CM

## 2020-11-11 DIAGNOSIS — M25611 Stiffness of right shoulder, not elsewhere classified: Secondary | ICD-10-CM

## 2020-11-11 DIAGNOSIS — R293 Abnormal posture: Secondary | ICD-10-CM

## 2020-11-11 NOTE — Therapy (Signed)
[facility] [address] [place], [address] Phone: [phone]   Fax:  [phone]  Physical Therapy Treatment  Patient Details  Name: Tina Stephenson MRN: [id number] Date of Birth: 06-Nov-1955 Referring Provider (PT): Dwana Melena, [place]   Encounter Date: 11/11/2020   PT End of Session - 11/11/20 1019     Visit Number 6    Number of Visits 24    Date for PT Re-Evaluation 01/07/21    Authorization Type [employer] Managed    Authorization Time Period 20% CO-INSURANCE, DED MET, 60 DAYS PER CALENDER YEAR AFTER 5TH VISIT, MEDICAL NESSITY NEEDED/REF#1980    Progress Note Due on Visit 10    PT Start Time 1019    PT Stop Time 1102    PT Time Calculation (min) 43 min    Activity Tolerance Patient limited by pain    Behavior During Therapy [facility] for tasks assessed/performed             Past Medical History:  Diagnosis Date   Bronchitis    Depression    Diabetes mellitus without complication ([place])    Gallbladder polyp    GERD (gastroesophageal reflux disease)    Hypertension    Hypertensive disorder 03/05/2015   Liver tumor (benign)    Sacrum and coccyx fracture ([place])    Sciatica     Past Surgical History:  Procedure Laterality Date   APPENDECTOMY     NASAL SEPTUM SURGERY     WISDOM TOOTH EXTRACTION      There were no vitals filed for this visit.   Subjective Assessment - 11/11/20 1021     Subjective R shoulder is not bad today, about 2-3/10 R shoulder pain currently.    Pertinent History DM2, cervical herniated disc, scoliosis, obesity, HTN,  pt reports lumbar & cervical fractures,    Patient Stated Goals To move dominant arm, no pain, full range, return to work    Currently in Pain? Yes    Pain Score 3     Pain Onset 1 to 4 weeks ago                                        PT Education - 11/11/20 1040     Education Details PROM    Person(s) Educated Patient     Methods Explanation;Verbal cues    Comprehension Verbalized understanding;Returned demonstration             Objectives   Therapeutic exercises   Supine PROM              Flexion 10x3             Abduction 10x3             ER (at neutral) 10x3   Scapular retraction 10x3 with 5 second holds    Improved exercise technique, movement at target joints, PROM after mod verbal, visual, tactile cues.      Ice R shoulder for 15 minutes after exercises         Response to treatment Pt tolerated session well without aggravation of symptoms.      Clinical impression Decreased starting pain level and stiffness obesrved and palpated. Overall improving R shoulder ER and abduction PROM observed. Flexion PROM most stiff direction. Continued PROM exercises per protocol. Pt tolerated session well without aggravation of symptoms. Pt will benefit  from continued skilled physical therapy services to improve ROM, strength and function.         PT Short Term Goals - 11/03/20 1540       PT SHORT TERM GOAL #1   Title FOTO improved to 35%    Time 4    Period Weeks    Status New    Target Date 11/23/20      PT SHORT TERM GOAL #2   Title right shoulder PROM flexion & abduction 100*    Time 4    Period Weeks    Status New    Target Date 11/23/20      PT SHORT TERM GOAL #3   Title Patient is independent with initial HEP    Time 4    Period Weeks    Status New    Target Date 11/23/20               PT Long Term Goals - 11/03/20 1541       PT LONG TERM GOAL #1   Title Patient reports pain in right shoulder </= 2/10 with activities    Time 10    Period Weeks    Status New    Target Date 01/07/21      PT LONG TERM GOAL #2   Title FOTO >/= 55%    Time 10    Period Weeks    Status New    Target Date 01/07/21      PT LONG TERM GOAL #3   Title Right shoulder AROM Flexion & abduction 120* and external & internal rotation 60*    Time 10    Period Weeks    Status New     Target Date 01/07/21      PT LONG TERM GOAL #4   Title Right shoulder strength >/= 4/5    Time 10    Period Weeks    Status New    Target Date 01/07/21                   Plan - 11/11/20 1041     Clinical Impression Statement Decreased starting pain level and stiffness obesrved and palpated. Overall improving R shoulder ER and abduction PROM observed. Flexion PROM most stiff direction. Continued PROM exercises per protocol. Pt tolerated session well without aggravation of symptoms. Pt will benefit from continued skilled physical therapy services to improve ROM, strength and function.    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, jewellery Low    Rehab Potential Good    PT Frequency 3x / week   3x/wk for 4 weeks, then 2x/wk for 6 weeks   PT Duration Other (comment)   10 weeks   PT Treatment/Interventions ADLs/Self Care Home Management;Cryotherapy;Electrical Stimulation;Moist Heat;Functional mobility training;Therapeutic activities;Therapeutic exercise;Balance training;Neuromuscular re-education;Patient/family education;Manual techniques;Scar mobilization;Passive range of motion;Vasopneumatic Device;Joint Manipulations    PT Next Visit Plan HEP, PROM & manual therapy to increase ROM; check measurements    PT Home Exercise Plan Access Code: [email or web address]    Consulted and Agree with Plan of Care Patient             Patient will benefit from skilled therapeutic intervention in order to improve the following deficits and impairments:  Decreased coordination, Decreased endurance, Decreased range of motion, Decreased  strength, Increased edema, Increased muscle spasms, Postural dysfunction, Obesity,  Pain  Visit Diagnosis: Stiffness of right shoulder, not elsewhere classified  Acute pain of right shoulder  Muscle weakness (generalized)  Abnormal posture  Localized edema     Problem List Patient Active Problem List   Diagnosis Date Noted   Traumatic tear of supraspinatus tendon of right shoulder 09/24/2020   Tendinopathy of right biceps tendon 09/24/2020   Traumatic tear of supraspinatus tendon of left shoulder 09/24/2020   Leg swelling 08/02/2020   Dyslipidemia 01/05/2020   Cervical polyp 08/22/2018   Nocturia 08/22/2018   Intertrigo 08/22/2018   Type 2 diabetes mellitus without complication, without long-term current use of insulin ([place]) 08/22/2018   Cervical spine arthritis with nerve pain 06/25/2018   Herniated disc, cervical 06/25/2018   History of migraine 03/25/2018   Scoliosis 03/25/2018   History of cyst of breast 03/25/2018   Sacrum and coccyx fracture, sequela 02/21/2018   Class 3 severe obesity due to excess calories without serious comorbidity with body mass index (BMI) of 45.0 to 49.9 in adult [facility]) 02/21/2018   Cough 02/21/2018   Onychomycosis 02/21/2018   History of hypertension 02/21/2018   Vitamin D deficiency 03/19/2015   Essential hypertension 03/05/2015    Joneen Boers PT, DPT   11/11/2020, 1:18 PM  [facility] [address] [place], [address] Phone: [phone]   Fax:  [phone]  Name: Tina Stephenson MRN: [id number] Date of Birth: [birth year]

## 2020-11-15 ENCOUNTER — Ambulatory Visit: Payer: Managed Care, Other (non HMO)

## 2020-11-15 DIAGNOSIS — R600 Localized edema: Secondary | ICD-10-CM

## 2020-11-15 DIAGNOSIS — R293 Abnormal posture: Secondary | ICD-10-CM

## 2020-11-15 DIAGNOSIS — M25611 Stiffness of right shoulder, not elsewhere classified: Secondary | ICD-10-CM

## 2020-11-15 DIAGNOSIS — M6281 Muscle weakness (generalized): Secondary | ICD-10-CM

## 2020-11-15 DIAGNOSIS — M25511 Pain in right shoulder: Secondary | ICD-10-CM

## 2020-11-15 NOTE — Therapy (Signed)
[facility] [address] [place], [address] Phone: [phone]   Fax:  [phone]  Physical Therapy Treatment  Patient Details  Name: Tina Stephenson MRN: [id number] Date of Birth: [birth year] Referring Provider (PT): Dwana Melena, [place]   Encounter Date: 11/15/2020   PT End of Session - 11/15/20 0942     Visit Number 7    Number of Visits 24    Date for PT Re-Evaluation 01/07/21    Authorization Type [employer] Managed    Authorization Time Period 20% CO-INSURANCE, DED MET, 60 DAYS PER CALENDER YEAR AFTER 5TH VISIT, MEDICAL NESSITY NEEDED/REF#1980    Progress Note Due on Visit 10    PT Start Time 0942    PT Stop Time 1025    PT Time Calculation (min) 43 min    Activity Tolerance Patient limited by pain    Behavior During Therapy [facility] for tasks assessed/performed             Past Medical History:  Diagnosis Date   Bronchitis    Depression    Diabetes mellitus without complication ([place])    Gallbladder polyp    GERD (gastroesophageal reflux disease)    Hypertension    Hypertensive disorder 03/05/2015   Liver tumor (benign)    Sacrum and coccyx fracture ([place])    Sciatica     Past Surgical History:  Procedure Laterality Date   APPENDECTOMY     NASAL SEPTUM SURGERY     WISDOM TOOTH EXTRACTION      There were no vitals filed for this visit.   Subjective Assessment - 11/15/20 0944     Subjective R shoulder is about 3-4/10 currently. Pt hit her R funny bone and accidently jerked her R shoulder up and it hurt. Pain eased off since then.    Pertinent History DM2, cervical herniated disc, scoliosis, obesity, HTN,  pt reports lumbar & cervical fractures,    Patient Stated Goals To move dominant arm, no pain, full range, return to work    Currently in Pain? Yes    Pain Score 4     Pain Onset 1 to 4 weeks ago                                        PT Education - 11/15/20  1826     Education Details PROM    Person(s) Educated Patient    Methods Explanation    Comprehension Verbalized understanding;Returned demonstration           Objectives   Therapeutic exercises   Supine PROM              Flexion 10x3             Abduction 10x3             ER (at neutral) 10x3   Scapular retraction 10x3 with 5 second holds    Improved exercise technique, movement at target joints, PROM after mod verbal, visual, tactile cues.      Ice R shoulder for 15 minutes after exercises         Response to treatment Pt tolerated session well without aggravation of symptoms.      Clinical impression Continued PROM exercises per protocol. Stiff end feel. Better able to relax UE for PROM. Pt tolerated session well without aggravation of symptoms. Pt will benefit from  continued skilled physical therapy services to improve ROM, strength and function.       PT Short Term Goals - 11/03/20 1540       PT SHORT TERM GOAL #1   Title FOTO improved to 35%    Time 4    Period Weeks    Status New    Target Date 11/23/20      PT SHORT TERM GOAL #2   Title right shoulder PROM flexion & abduction 100*    Time 4    Period Weeks    Status New    Target Date 11/23/20      PT SHORT TERM GOAL #3   Title Patient is independent with initial HEP    Time 4    Period Weeks    Status New    Target Date 11/23/20               PT Long Term Goals - 11/03/20 1541       PT LONG TERM GOAL #1   Title Patient reports pain in right shoulder </= 2/10 with activities    Time 10    Period Weeks    Status New    Target Date 01/07/21      PT LONG TERM GOAL #2   Title FOTO >/= 55%    Time 10    Period Weeks    Status New    Target Date 01/07/21      PT LONG TERM GOAL #3   Title Right shoulder AROM Flexion & abduction 120* and external & internal rotation 60*    Time 10    Period Weeks    Status New    Target Date 01/07/21      PT LONG TERM GOAL #4   Title  Right shoulder strength >/= 4/5    Time 10    Period Weeks    Status New    Target Date 01/07/21                   Plan - 11/15/20 1826     Clinical Impression Statement Continued PROM exercises per protocol. Stiff end feel. Better able to relax UE for PROM. Pt tolerated session well without aggravation of symptoms. Pt will benefit from continued skilled physical therapy services to improve ROM, strength and function.    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, jewellery Low    Rehab Potential Good    PT Frequency 3x / week   3x/wk for 4 weeks, then 2x/wk for 6 weeks   PT Duration Other (comment)   10 weeks   PT Treatment/Interventions ADLs/Self Care Home Management;Cryotherapy;Electrical Stimulation;Moist Heat;Functional mobility training;Therapeutic activities;Therapeutic exercise;Balance training;Neuromuscular re-education;Patient/family education;Manual techniques;Scar mobilization;Passive range of motion;Vasopneumatic Device;Joint Manipulations    PT Next Visit Plan HEP, PROM & manual therapy to increase ROM; check measurements    PT Home Exercise Plan Access Code: [email or web address]    Consulted and Agree with Plan of Care Patient             Patient will benefit from skilled therapeutic intervention in order to improve the following deficits and impairments:  Decreased coordination, Decreased endurance, Decreased range of motion, Decreased strength, Increased edema, Increased muscle spasms, Postural dysfunction, Obesity, Pain  Visit Diagnosis: Stiffness of right  shoulder, not elsewhere classified  Muscle weakness (generalized)  Acute pain of right shoulder  Abnormal posture  Localized  edema     Problem List Patient Active Problem List   Diagnosis Date Noted   Traumatic tear of supraspinatus tendon of right shoulder 09/24/2020   Tendinopathy of right biceps tendon 09/24/2020   Traumatic tear of supraspinatus tendon of left shoulder 09/24/2020   Leg swelling 08/02/2020   Dyslipidemia 01/05/2020   Cervical polyp 08/22/2018   Nocturia 08/22/2018   Intertrigo 08/22/2018   Type 2 diabetes mellitus without complication, without long-term current use of insulin ([place]) 08/22/2018   Cervical spine arthritis with nerve pain 06/25/2018   Herniated disc, cervical 06/25/2018   History of migraine 03/25/2018   Scoliosis 03/25/2018   History of cyst of breast 03/25/2018   Sacrum and coccyx fracture, sequela 02/21/2018   Class 3 severe obesity due to excess calories without serious comorbidity with body mass index (BMI) of 45.0 to 49.9 in adult ([place]) 02/21/2018   Cough 02/21/2018   Onychomycosis 02/21/2018   History of hypertension 02/21/2018   Vitamin D deficiency 03/19/2015   Essential hypertension 03/05/2015    Kris Burd, PT 11/15/2020, 6:30 PM  [facility] [address] [place], [address] Phone: [phone]   Fax:  [phone]  Name: Tina Stephenson MRN: [id number] Date of Birth: Dec 30, 1955

## 2020-11-16 LAB — HM DIABETES EYE EXAM

## 2020-11-17 ENCOUNTER — Ambulatory Visit: Payer: Managed Care, Other (non HMO)

## 2020-11-17 ENCOUNTER — Encounter: Payer: Managed Care, Other (non HMO) | Admitting: Physical Therapy

## 2020-11-17 DIAGNOSIS — M6281 Muscle weakness (generalized): Secondary | ICD-10-CM

## 2020-11-17 DIAGNOSIS — M25611 Stiffness of right shoulder, not elsewhere classified: Secondary | ICD-10-CM

## 2020-11-17 DIAGNOSIS — M25511 Pain in right shoulder: Secondary | ICD-10-CM

## 2020-11-17 NOTE — Patient Instructions (Signed)
Access Code: [email or web address] URL: [email or web address] Date: 11/17/2020 Prepared by: Joneen Boers  Exercises Supine Shoulder Flexion AAROM - 2-3 x daily - 7 x weekly - 3 sets - 10 reps - 2 hold

## 2020-11-17 NOTE — Therapy (Signed)
[place] [facility] [address] [place], [address] Phone: [phone]   Fax:  [phone]  Physical Therapy Treatment  Patient Details  Name: Tina Stephenson MRN: [id number] Date of Birth: [birth year] Referring Provider (PT): Dwana Melena, [place]   Encounter Date: 11/17/2020   PT End of Session - 11/17/20 1019     Visit Number 8    Number of Visits 24    Date for PT Re-Evaluation 01/07/21    Authorization Type [employer] Managed    Authorization Time Period 20% CO-INSURANCE, DED MET, 60 DAYS PER CALENDER YEAR AFTER 5TH VISIT, MEDICAL NESSITY NEEDED/REF#1980    Progress Note Due on Visit 10    PT Start Time 1019    PT Stop Time 1107    PT Time Calculation (min) 48 min    Activity Tolerance Patient limited by pain    Behavior During Therapy [facility] for tasks assessed/performed             Past Medical History:  Diagnosis Date   Bronchitis    Depression    Diabetes mellitus without complication ([place])    Gallbladder polyp    GERD (gastroesophageal reflux disease)    Hypertension    Hypertensive disorder 03/05/2015   Liver tumor (benign)    Sacrum and coccyx fracture ([place])    Sciatica     Past Surgical History:  Procedure Laterality Date   APPENDECTOMY     NASAL SEPTUM SURGERY     WISDOM TOOTH EXTRACTION      There were no vitals filed for this visit.   Subjective Assessment - 11/17/20 1020     Subjective R shoulder is sore. about a 4-5/10 currently. She got patted on the shoulder yesterday which hurt.    Pertinent History DM2, cervical herniated disc, scoliosis, obesity, HTN,  pt reports lumbar & cervical fractures,    Patient Stated Goals To move dominant arm, no pain, full range, return to work    Currently in Pain? Yes    Pain Score 5     Pain Onset 1 to 4 weeks ago                                        PT Education - 11/17/20 1057     Education Details ther-ex, HEP     Person(s) Educated Patient    Methods Explanation;Demonstration;Tactile cues;Verbal cues;Handout    Comprehension Returned demonstration;Verbalized understanding           Objectives   Medbridge Access Code [email or web address]  Therapeutic exercises   Supine AAROM              Flexion 10x3  Scaption 5x             Abduction 10x3             ER (at neutral) 10x3   Standing submax/pain free level of effort shoulder isometrics in neutral per protocol  Flexion 30 seconds x 2  ER 30 seconds x 2  IR 30 seconds x 2  Ext 30 seconds x 2  Scapular retraction 10x3 with 5 second holds       Improved exercise technique, movement at target joints, PROM after mod verbal, visual, tactile cues.      Ice R shoulder for 15 minutes after exercises         Response to treatment Pt  tolerated session well without aggravation of symptoms.      Clinical impression Introduced AAROM and gentle pain-free submax isometric contraction today (pt currently 4 weeks to 5 weeks post op) per protocol. Pt tolerated session well without aggravation of symptoms. Pt will benefit from continued skilled physical therapy services to improve ROM, strength and function.      PT Short Term Goals - 11/03/20 1540       PT SHORT TERM GOAL #1   Title FOTO improved to 35%    Time 4    Period Weeks    Status New    Target Date 11/23/20      PT SHORT TERM GOAL #2   Title right shoulder PROM flexion & abduction 100*    Time 4    Period Weeks    Status New    Target Date 11/23/20      PT SHORT TERM GOAL #3   Title Patient is independent with initial HEP    Time 4    Period Weeks    Status New    Target Date 11/23/20               PT Long Term Goals - 11/03/20 1541       PT LONG TERM GOAL #1   Title Patient reports pain in right shoulder </= 2/10 with activities    Time 10    Period Weeks    Status New    Target Date 01/07/21      PT LONG TERM GOAL #2   Title FOTO >/= 55%    Time 10    Period  Weeks    Status New    Target Date 01/07/21      PT LONG TERM GOAL #3   Title Right shoulder AROM Flexion & abduction 120* and external & internal rotation 60*    Time 10    Period Weeks    Status New    Target Date 01/07/21      PT LONG TERM GOAL #4   Title Right shoulder strength >/= 4/5    Time 10    Period Weeks    Status New    Target Date 01/07/21                   Plan - 11/17/20 1056     Clinical Impression Statement Introduced AAROM and gentle pain-free submax isometric contraction today (pt currently 4 weeks to 5 weeks post op) per protocol. Pt tolerated session well without aggravation of symptoms. Pt will benefit from continued skilled physical therapy services to improve ROM, strength and function.    Personal Factors and Comorbidities Comorbidity 3+    Comorbidities DM2, cervical herniated disc, scoliosis, obesity, HTN,    Examination-Activity Limitations Lift;Carry;Reach Overhead;Sleep    Examination-Participation Restrictions Occupation;Yard Work    Stability/Clinical Decision Making Stable/Uncomplicated    Rehab Potential Good    PT Frequency 3x / week   3x/wk for 4 weeks, then 2x/wk for 6 weeks   PT Duration Other (comment)   10 weeks   PT Treatment/Interventions ADLs/Self Care Home Management;Cryotherapy;Electrical Stimulation;Moist Heat;Functional mobility training;Therapeutic activities;Therapeutic exercise;Balance training;Neuromuscular re-education;Patient/family education;Manual techniques;Scar mobilization;Passive range of motion;Vasopneumatic Device;Joint Manipulations    PT Next Visit Plan HEP, PROM & manual therapy to increase ROM; check measurements    PT Home Exercise Plan Access Code: [email or web address]    Consulted and Agree with Plan of Care Patient  Patient will benefit from skilled therapeutic intervention in order to improve the following deficits and impairments:  Decreased coordination, Decreased endurance, Decreased range of  motion, Decreased strength, Increased edema, Increased muscle spasms, Postural dysfunction, Obesity, Pain  Visit Diagnosis: Stiffness of right shoulder, not elsewhere classified  Muscle weakness (generalized)  Acute pain of right shoulder     Problem List Patient Active Problem List   Diagnosis Date Noted   Traumatic tear of supraspinatus tendon of right shoulder 09/24/2020   Tendinopathy of right biceps tendon 09/24/2020   Traumatic tear of supraspinatus tendon of left shoulder 09/24/2020   Leg swelling 08/02/2020   Dyslipidemia 01/05/2020   Cervical polyp 08/22/2018   Nocturia 08/22/2018   Intertrigo 08/22/2018   Type 2 diabetes mellitus without complication, without long-term current use of insulin ([place]) 08/22/2018   Cervical spine arthritis with nerve pain 06/25/2018   Herniated disc, cervical 06/25/2018   History of migraine 03/25/2018   Scoliosis 03/25/2018   History of cyst of breast 03/25/2018   Sacrum and coccyx fracture, sequela 02/21/2018   Class 3 severe obesity due to excess calories without serious comorbidity with body mass index (BMI) of 45.0 to 49.9 in adult [facility]) 02/21/2018   Cough 02/21/2018   Onychomycosis 02/21/2018   History of hypertension 02/21/2018   Vitamin D deficiency 03/19/2015   Essential hypertension 03/05/2015    Joneen Boers PT, DPT  11/17/2020, 1:35 PM   [place] [facility] [address] [place], [address] Phone: [phone]   Fax:  [phone]  Name: Ivanka Kirshner MRN: [id number] Date of Birth: 08-17-1955

## 2020-11-18 ENCOUNTER — Encounter: Payer: Managed Care, Other (non HMO) | Admitting: Physical Therapy

## 2020-11-18 ENCOUNTER — Ambulatory Visit: Payer: Managed Care, Other (non HMO)

## 2020-11-18 ENCOUNTER — Encounter: Payer: Self-pay | Admitting: Family Medicine

## 2020-11-18 DIAGNOSIS — M25611 Stiffness of right shoulder, not elsewhere classified: Secondary | ICD-10-CM

## 2020-11-18 DIAGNOSIS — M6281 Muscle weakness (generalized): Secondary | ICD-10-CM

## 2020-11-18 NOTE — Therapy (Signed)
[place] [facility] [address] [place], [address] Phone: [phone]   Fax:  [phone]  Physical Therapy Treatment  Patient Details  Name: Tina Stephenson MRN: [id number] Date of Birth: 05/15/1955 Referring Provider (PT): Dwana Melena, [place]   Encounter Date: 11/18/2020   PT End of Session - 11/18/20 1019     Visit Number 9    Number of Visits 24    Date for PT Re-Evaluation 01/07/21    Authorization Type [employer] Managed    Authorization Time Period 20% CO-INSURANCE, DED MET, 60 DAYS PER CALENDER YEAR AFTER 5TH VISIT, MEDICAL NESSITY NEEDED/REF#1980    Progress Note Due on Visit 10    PT Start Time 1019    PT Stop Time 1117    PT Time Calculation (min) 58 min    Activity Tolerance Patient limited by pain    Behavior During Therapy [facility] for tasks assessed/performed             Past Medical History:  Diagnosis Date   Bronchitis    Depression    Diabetes mellitus without complication ([place])    Gallbladder polyp    GERD (gastroesophageal reflux disease)    Hypertension    Hypertensive disorder 03/05/2015   Liver tumor (benign)    Sacrum and coccyx fracture ([place])    Sciatica     Past Surgical History:  Procedure Laterality Date   APPENDECTOMY     NASAL SEPTUM SURGERY     WISDOM TOOTH EXTRACTION      There were no vitals filed for this visit.   Subjective Assessment - 11/18/20 1024     Subjective No pain currently.    Pertinent History DM2, cervical herniated disc, scoliosis, obesity, HTN,  pt reports lumbar & cervical fractures,    Patient Stated Goals To move dominant arm, no pain, full range, return to work    Currently in Pain? No/denies    Pain Onset 1 to 4 weeks ago                                        PT Education - 11/18/20 1415     Education Details ther-ex    [employer]) Educated Patient    Methods Explanation;Demonstration;Tactile cues;Verbal cues     Comprehension Returned demonstration;Verbalized understanding            Objectives   Medbridge Access Code [email or web address]   Therapeutic exercises   Supine AAROM with PT             Flexion 10x3             Scaption 10x             Abduction 10x3             ER (at neutral) 10x3   Stiff end feel, no increase in pain  Seated submax/pain free level of effort shoulder isometrics in neutral per protocol, PT manual resisted              Flexion 10x5 seconds             ER 10x5 seconds              IR 10x5 seconds             Ext 10x5 seconds    Scapular retraction 10x3 with 5 second holds  Seated chin tucks 10x5 seconds       Improved exercise technique, movement at target joints, PROM after mod verbal, visual, tactile cues.      Ice R shoulder for 15 minutes after exercises         Response to treatment Pt tolerated session well without aggravation of symptoms.      Clinical impression Increased level of assist for [facility] for improved comfort. Stiff end feel. Continued working on isometric muscle activation to decrease weakness. Pt tolerated session well without aggravation of symptoms. Pt will benefit from continued skilled physical therapy services to improve ROM, strength and function.      PT Short Term Goals - 11/03/20 1540       PT SHORT TERM GOAL #1   Title FOTO improved to 35%    Time 4    Period Weeks    Status New    Target Date 11/23/20      PT SHORT TERM GOAL #2   Title right shoulder PROM flexion & abduction 100*    Time 4    Period Weeks    Status New    Target Date 11/23/20      PT SHORT TERM GOAL #3   Title Patient is independent with initial HEP    Time 4    Period Weeks    Status New    Target Date 11/23/20               PT Long Term Goals - 11/03/20 1541       PT LONG TERM GOAL #1   Title Patient reports pain in right shoulder </= 2/10 with activities    Time 10    Period Weeks    Status New    Target Date 01/07/21       PT LONG TERM GOAL #2   Title FOTO >/= 55%    Time 10    Period Weeks    Status New    Target Date 01/07/21      PT LONG TERM GOAL #3   Title Right shoulder AROM Flexion & abduction 120* and external & internal rotation 60*    Time 10    Period Weeks    Status New    Target Date 01/07/21      PT LONG TERM GOAL #4   Title Right shoulder strength >/= 4/5    Time 10    Period Weeks    Status New    Target Date 01/07/21                   Plan - 11/18/20 1018     Clinical Impression Statement Increased level of assist for AAROM for improved comfort. Stiff end feel. Continued working on isometric muscle activation to decrease weakness. Pt tolerated session well without aggravation of symptoms. Pt will benefit from continued skilled physical therapy services to improve ROM, strength and function.    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, jewellery Low    Rehab Potential Good    PT Frequency 3x / week   3x/wk for 4 weeks, then 2x/wk for 6 weeks   PT Duration Other (comment)   10 weeks   PT Treatment/Interventions ADLs/Self Care Home Management;Cryotherapy;Electrical Stimulation;Moist Heat;Functional mobility training;Therapeutic activities;Therapeutic exercise;Balance  training;Neuromuscular re-education;Patient/family education;Manual techniques;Scar mobilization;Passive range of motion;Vasopneumatic Device;Joint Manipulations    PT Next Visit Plan HEP, PROM & manual therapy to increase ROM; check measurements    PT Home Exercise Plan Access Code: [email or web address]    Consulted and Agree with Plan of Care Patient             Patient will benefit from skilled therapeutic intervention in order  to improve the following deficits and impairments:  Decreased coordination, Decreased endurance, Decreased range of motion, Decreased strength, Increased edema, Increased muscle spasms, Postural dysfunction, Obesity, Pain  Visit Diagnosis: Stiffness of right shoulder, not elsewhere classified  Muscle weakness (generalized)     Problem List Patient Active Problem List   Diagnosis Date Noted   Traumatic tear of supraspinatus tendon of right shoulder 09/24/2020   Tendinopathy of right biceps tendon 09/24/2020   Traumatic tear of supraspinatus tendon of left shoulder 09/24/2020   Leg swelling 08/02/2020   Dyslipidemia 01/05/2020   Cervical polyp 08/22/2018   Nocturia 08/22/2018   Intertrigo 08/22/2018   Type 2 diabetes mellitus without complication, without long-term current use of insulin ([place]) 08/22/2018   Cervical spine arthritis with nerve pain 06/25/2018   Herniated disc, cervical 06/25/2018   History of migraine 03/25/2018   Scoliosis 03/25/2018   History of cyst of breast 03/25/2018   Sacrum and coccyx fracture, sequela 02/21/2018   Class 3 severe obesity due to excess calories without serious comorbidity with body mass index (BMI) of 45.0 to 49.9 in adult [facility]) 02/21/2018   Cough 02/21/2018   Onychomycosis 02/21/2018   History of hypertension 02/21/2018   Vitamin D deficiency 03/19/2015   Essential hypertension 03/05/2015    Joneen Boers PT, DPT   11/18/2020, 2:19 PM  [place] [facility] [address], [place], [address] Phone: [phone]   Fax:  [phone]  Name: Altovise Wahler MRN: [id number] Date of Birth: 17-Dec-1955

## 2020-11-19 ENCOUNTER — Encounter: Payer: Managed Care, Other (non HMO) | Admitting: Physical Therapy

## 2020-11-22 ENCOUNTER — Ambulatory Visit: Payer: Managed Care, Other (non HMO)

## 2020-11-22 DIAGNOSIS — M25611 Stiffness of right shoulder, not elsewhere classified: Secondary | ICD-10-CM

## 2020-11-22 DIAGNOSIS — M6281 Muscle weakness (generalized): Secondary | ICD-10-CM

## 2020-11-22 DIAGNOSIS — R293 Abnormal posture: Secondary | ICD-10-CM

## 2020-11-22 DIAGNOSIS — R600 Localized edema: Secondary | ICD-10-CM

## 2020-11-22 DIAGNOSIS — M25511 Pain in right shoulder: Secondary | ICD-10-CM

## 2020-11-22 NOTE — Therapy (Signed)
[place] [facility] [address], [place], [address] Phone: [phone]   Fax:  [phone]  Physical Therapy Treatment And Progress Report (11/03/2020 - 11/22/2020)  Patient Details  Name: Tina Stephenson MRN: [id number] Date of Birth: 12/19/1955 Referring Provider (PT): Dwana Melena, [place]   Encounter Date: 11/22/2020   PT End of Session - 11/22/20 1151     Visit Number 10    Number of Visits 24    Date for PT Re-Evaluation 01/07/21    Authorization Type [employer] Managed    Authorization Time Period 20% CO-INSURANCE, DED MET, 60 DAYS PER CALENDER YEAR AFTER 5TH VISIT, MEDICAL NESSITY NEEDED/REF#1980    Progress Note Due on Visit 10    PT Start Time 1151    PT Stop Time 1245    PT Time Calculation (min) 54 min    Activity Tolerance Patient limited by pain    Behavior During Therapy [facility] for tasks assessed/performed             Past Medical History:  Diagnosis Date   Bronchitis    Depression    Diabetes mellitus without complication ([place])    Gallbladder polyp    GERD (gastroesophageal reflux disease)    Hypertension    Hypertensive disorder 03/05/2015   Liver tumor (benign)    Sacrum and coccyx fracture ([place])    Sciatica     Past Surgical History:  Procedure Laterality Date   APPENDECTOMY     NASAL SEPTUM SURGERY     WISDOM TOOTH EXTRACTION      There were no vitals filed for this visit.   Subjective Assessment - 11/22/20 1152     Subjective R shoulder is about 2-3/10 currenty.    Pertinent History DM2, cervical herniated disc, scoliosis, obesity, HTN,  pt reports lumbar & cervical fractures,    Patient Stated Goals To move dominant arm, no pain, full range, return to work    Currently in Pain? Yes    Pain Score 3     Pain Onset 1 to 4 weeks ago                                        PT Education - 11/22/20 1250     Education Details ther-ex, progress/current  status    Person(s) Educated Patient    Methods Explanation;Demonstration;Tactile cues;Verbal cues    Comprehension Verbalized understanding;Returned demonstration             Objectives   Medbridge Access Code [email or web address]   Therapeutic exercises   Supine AAROM with PT             Flexion 10x3             Abduction 10x3             ER (at neutral) 10x3   Stiff end feel, no increase in pain   Seated submax/pain free level of effort shoulder isometrics in neutral per protocol, PT manual resisted               Flexion 30 seconds for 3 sets             ER 30 seconds for 3 sets             IR 30 seconds for 3 sets  Ext 30 seconds for 3 sets   Abduction 30 seconds for 3 sets  Seated chin tucks 10x5 seconds   Scapular retraction 10x3 with 5 second holds        Improved exercise technique, movement at target joints, PROM after mod verbal, visual, tactile cues.      Ice R shoulder for 15 minutes after exercises         Response to treatment Pt tolerated session well without aggravation of symptoms.      Clinical impression Pt is currently 5 weeks post op. Pt demonstrates overall improved R shoulder flexion and abduction ROM with pt able to achieve 115 degrees and 91 degrees AAROM respectively. Stiff end feel but gradually improving. Currently working on gentle isometrics and AAROM per protocol. Pt tolerated session well without aggravation of symptoms. Pt will benefit from continued skilled physical therapy services to improve ROM, strength and function.        PT Short Term Goals - 11/22/20 1155       PT SHORT TERM GOAL #1   Title FOTO improved to 35%    Baseline Unable to access FOTO information and retrieve questionnaire for pt to fill out. (11/22/2020)    Time 4    Period Weeks    Status On-going    Target Date 11/23/20      PT SHORT TERM GOAL #2   Title right shoulder PROM flexion & abduction 100*    Baseline Supine AAROM R shoulder 115 degrees  flexion, 91 degrees abduction (11/22/2020)    Time 4    Period Weeks    Status Partially Met    Target Date 11/23/20      PT SHORT TERM GOAL #3   Title Patient is independent with initial HEP; No questions after reviewed and redemonstrated today (11/22/2020)    Time 4    Period Weeks    Status Achieved    Target Date 11/23/20               PT Long Term Goals - 11/03/20 1541       PT LONG TERM GOAL #1   Title Patient reports pain in right shoulder </= 2/10 with activities    Time 10    Period Weeks    Status New    Target Date 01/07/21      PT LONG TERM GOAL #2   Title FOTO >/= 55%    Time 10    Period Weeks    Status New    Target Date 01/07/21      PT LONG TERM GOAL #3   Title Right shoulder AROM Flexion & abduction 120* and external & internal rotation 60*    Time 10    Period Weeks    Status New    Target Date 01/07/21      PT LONG TERM GOAL #4   Title Right shoulder strength >/= 4/5    Time 10    Period Weeks    Status New    Target Date 01/07/21                   Plan - 11/22/20 1239     Clinical Impression Statement Pt is currently 5 weeks post op. Pt demonstrates overall improved R shoulder flexion and abduction ROM with pt able to achieve 115 degrees and 91 degrees AAROM respectively. Stiff end feel but gradually improving. Currently working on gentle isometrics and AAROM per protocol. Pt tolerated session well without  aggravation of symptoms. Pt will benefit from continued skilled physical therapy services to improve ROM, strength and function.    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, jewellery Low    Rehab Potential Good    PT Frequency 3x / week    3x/wk for 4 weeks, then 2x/wk for 6 weeks   PT Duration Other (comment)   10 weeks   PT Treatment/Interventions ADLs/Self Care Home Management;Cryotherapy;Electrical Stimulation;Moist Heat;Functional mobility training;Therapeutic activities;Therapeutic exercise;Balance training;Neuromuscular re-education;Patient/family education;Manual techniques;Scar mobilization;Passive range of motion;Vasopneumatic Device;Joint Manipulations    PT Next Visit Plan HEP, PROM & manual therapy to increase ROM; check measurements    PT Home Exercise Plan Access Code: [email or web address]; Medbridge Access Code [email or web address]    Consulted and Agree with Plan of Care Patient             Patient will benefit from skilled therapeutic intervention in order to improve the following deficits and impairments:  Decreased coordination, Decreased endurance, Decreased range of motion, Decreased strength, Increased edema, Increased muscle spasms, Postural dysfunction, Obesity, Pain  Visit Diagnosis: Stiffness of right shoulder, not elsewhere classified  Muscle weakness (generalized)  Acute pain of right shoulder  Abnormal posture  Localized edema     Problem List Patient Active Problem List   Diagnosis Date Noted   Traumatic tear of supraspinatus tendon of right shoulder 09/24/2020   Tendinopathy of right biceps tendon 09/24/2020   Traumatic tear of supraspinatus tendon of left shoulder 09/24/2020   Leg swelling 08/02/2020   Dyslipidemia 01/05/2020   Cervical polyp 08/22/2018   Nocturia 08/22/2018   Intertrigo 08/22/2018   Type 2 diabetes mellitus without complication, without long-term current use of insulin ([place]) 08/22/2018   Cervical spine arthritis with nerve pain 06/25/2018   Herniated disc, cervical 06/25/2018   History of migraine 03/25/2018   Scoliosis 03/25/2018   History of cyst of breast 03/25/2018   Sacrum and coccyx fracture, sequela 02/21/2018   Class 3 severe obesity due to excess calories without  serious comorbidity with body mass index (BMI) of 45.0 to 49.9 in adult ([place]) 02/21/2018   Cough 02/21/2018   Onychomycosis 02/21/2018   History of hypertension 02/21/2018   Vitamin D deficiency 03/19/2015   Essential hypertension 03/05/2015     Thank you for your referral.  Joneen Boers PT, DPT  11/22/2020, 12:52 PM  [place] [facility] [address] [place], [address] Phone: [phone]   Fax:  [phone]  Name: Tina Stephenson MRN: [id number] Date of Birth: 1955/05/29

## 2020-11-23 ENCOUNTER — Encounter: Payer: Self-pay | Admitting: Orthopaedic Surgery

## 2020-11-23 ENCOUNTER — Encounter: Payer: Managed Care, Other (non HMO) | Admitting: Physical Therapy

## 2020-11-24 ENCOUNTER — Ambulatory Visit: Payer: Managed Care, Other (non HMO)

## 2020-11-24 DIAGNOSIS — M25611 Stiffness of right shoulder, not elsewhere classified: Secondary | ICD-10-CM | POA: Diagnosis not present

## 2020-11-24 DIAGNOSIS — M6281 Muscle weakness (generalized): Secondary | ICD-10-CM

## 2020-11-24 DIAGNOSIS — R293 Abnormal posture: Secondary | ICD-10-CM

## 2020-11-24 DIAGNOSIS — R600 Localized edema: Secondary | ICD-10-CM

## 2020-11-24 DIAGNOSIS — M25511 Pain in right shoulder: Secondary | ICD-10-CM

## 2020-11-24 NOTE — Therapy (Signed)
[facility] [address], [place], [address] Phone: [phone]   Fax:  [phone]  Physical Therapy Treatment  Patient Details  Name: Tina Stephenson MRN: [id number] Date of Birth: [birth year] Referring Provider (PT): Dwana Melena, [place]   Encounter Date: 11/24/2020   PT End of Session - 11/24/20 0843     Visit Number 11    Number of Visits 24    Date for PT Re-Evaluation 01/07/21    Authorization Type [employer] Managed    Authorization Time Period 20% CO-INSURANCE, DED MET, 60 DAYS PER CALENDER YEAR AFTER 5TH VISIT, MEDICAL NESSITY NEEDED/REF#1980    Progress Note Due on Visit 53    PT Start Time 0843    PT Stop Time 0938    PT Time Calculation (min) 55 min    Activity Tolerance Patient limited by pain    Behavior During Therapy [facility] for tasks assessed/performed             Past Medical History:  Diagnosis Date   Bronchitis    Depression    Diabetes mellitus without complication ([place])    Gallbladder polyp    GERD (gastroesophageal reflux disease)    Hypertension    Hypertensive disorder 03/05/2015   Liver tumor (benign)    Sacrum and coccyx fracture ([place])    Sciatica     Past Surgical History:  Procedure Laterality Date   APPENDECTOMY     NASAL SEPTUM SURGERY     WISDOM TOOTH EXTRACTION      There were no vitals filed for this visit.   Subjective Assessment - 11/24/20 0843     Subjective Pt states shoulder is sore. 2/10 currently. 4-5/10 through the night. Was reaching for a can of soup Tuesday with her L hand and a 4 pack of soup fell and hit her R shoulder which caused her to have the 4-5/10 yesterday.    Pertinent History DM2, cervical herniated disc, scoliosis, obesity, HTN,  pt reports lumbar & cervical fractures,    Patient Stated Goals To move dominant arm, no pain, full range, return to work    Currently in Pain? Yes    Pain Score 2     Pain Onset 1 to 4 weeks ago                                         PT Education - 11/24/20 0904     Education Details ther-ex    Person(s) Educated Patient    Methods Explanation;Demonstration;Tactile cues;Verbal cues    Comprehension Returned demonstration;Verbalized understanding            Objectives   Medbridge Access Code [email or web address]   Therapeutic exercises   Supine AAROM with PT             Flexion 10x3             Abduction 10x3             ER (at neutral) 10x3   Stiff end feel, no increase in pain   Seated submax/pain free level of effort shoulder isometrics in neutral per protocol, PT manual resisted               Flexion 30 seconds for 3 sets             ER 30 seconds for 3 sets  IR 30 seconds for 3 sets             Ext 30 seconds for 3 sets              Abduction 30 seconds for 3 sets   Seated chin tucks 10x5 seconds with B scapular retraction    Seated R shoulder AAROM table slides abduction 10x5 seconds for 2 sets   Improved exercise technique, movement at target joints, PROM after mod verbal, visual, tactile cues.      Ice R shoulder for 15 minutes after exercises         Response to treatment Pt tolerated session well without aggravation of symptoms.      Clinical impression Continued working on PROM/AAROM and gentle isometrics per protocol. Stiff end feel. No complain of pain with isometrics. Pt tolerated session well without aggravation of symptoms. Pt will benefit from continued skilled physical therapy services to improve ROM, strength and function.        PT Short Term Goals - 11/22/20 1155       PT SHORT TERM GOAL #1   Title FOTO improved to 35%    Baseline Unable to access FOTO information and retrieve questionnaire for pt to fill out. (11/22/2020)    Time 4    Period Weeks    Status On-going    Target Date 11/23/20      PT SHORT TERM GOAL #2   Title right shoulder PROM flexion & abduction 100*    Baseline Supine AAROM R  shoulder 115 degrees flexion, 91 degrees abduction (11/22/2020)    Time 4    Period Weeks    Status Partially Met    Target Date 11/23/20      PT SHORT TERM GOAL #3   Title Patient is independent with initial HEP; No questions after reviewed and redemonstrated today (11/22/2020)    Time 4    Period Weeks    Status Achieved    Target Date 11/23/20               PT Long Term Goals - 11/03/20 1541       PT LONG TERM GOAL #1   Title Patient reports pain in right shoulder </= 2/10 with activities    Time 10    Period Weeks    Status New    Target Date 01/07/21      PT LONG TERM GOAL #2   Title FOTO >/= 55%    Time 10    Period Weeks    Status New    Target Date 01/07/21      PT LONG TERM GOAL #3   Title Right shoulder AROM Flexion & abduction 120* and external & internal rotation 60*    Time 10    Period Weeks    Status New    Target Date 01/07/21      PT LONG TERM GOAL #4   Title Right shoulder strength >/= 4/5    Time 10    Period Weeks    Status New    Target Date 01/07/21                   Plan - 11/24/20 0904     Clinical Impression Statement Continued working on PROM/AAROM and gentle isometrics per protocol. Stiff end feel. No complain of pain with isometrics. Pt tolerated session well without aggravation of symptoms. Pt will benefit from continued skilled physical therapy services to improve ROM, strength and  function.    Personal Factors and Comorbidities Comorbidity 3+    Comorbidities DM2, cervical herniated disc, scoliosis, obesity, HTN,    Examination-Activity Limitations Lift;Carry;Reach Overhead;Sleep    Examination-Participation Restrictions Occupation;Yard Work    Stability/Clinical Decision Making Stable/Uncomplicated    Rehab Potential Good    PT Frequency 3x / week   3x/wk for 4 weeks, then 2x/wk for 6 weeks   PT Duration Other (comment)   10 weeks   PT Treatment/Interventions ADLs/Self Care Home Management;Cryotherapy;Electrical  Stimulation;Moist Heat;Functional mobility training;Therapeutic activities;Therapeutic exercise;Balance training;Neuromuscular re-education;Patient/family education;Manual techniques;Scar mobilization;Passive range of motion;Vasopneumatic Device;Joint Manipulations    PT Next Visit Plan HEP, PROM & manual therapy to increase ROM; check measurements    PT Home Exercise Plan Access Code: [email or web address]; Medbridge Access Code [email or web address]    Consulted and Agree with Plan of Care Patient             Patient will benefit from skilled therapeutic intervention in order to improve the following deficits and impairments:  Decreased coordination, Decreased endurance, Decreased range of motion, Decreased strength, Increased edema, Increased muscle spasms, Postural dysfunction, Obesity, Pain  Visit Diagnosis: Stiffness of right shoulder, not elsewhere classified  Muscle weakness (generalized)  Acute pain of right shoulder  Abnormal posture  Localized edema     Problem List Patient Active Problem List   Diagnosis Date Noted   Traumatic tear of supraspinatus tendon of right shoulder 09/24/2020   Tendinopathy of right biceps tendon 09/24/2020   Traumatic tear of supraspinatus tendon of left shoulder 09/24/2020   Leg swelling 08/02/2020   Dyslipidemia 01/05/2020   Cervical polyp 08/22/2018   Nocturia 08/22/2018   Intertrigo 08/22/2018   Type 2 diabetes mellitus without complication, without long-term current use of insulin ([place]) 08/22/2018   Cervical spine arthritis with nerve pain 06/25/2018   Herniated disc, cervical 06/25/2018   History of migraine 03/25/2018   Scoliosis 03/25/2018   History of cyst of breast 03/25/2018   Sacrum and coccyx fracture, sequela 02/21/2018   Class 3 severe obesity due to excess calories without serious comorbidity with body mass index (BMI) of 45.0 to 49.9 in adult [facility]) 02/21/2018   Cough 02/21/2018   Onychomycosis 02/21/2018   History of hypertension 02/21/2018    Vitamin D deficiency 03/19/2015   Essential hypertension 03/05/2015    Joneen Boers PT, DPT   11/24/2020, 12:41 PM  [place] [facility] [address], [place], [address] Phone: [phone]   Fax:  [phone]  Name: Tina Stephenson MRN: [id number] Date of Birth: [birth year]

## 2020-11-25 ENCOUNTER — Encounter: Payer: Self-pay | Admitting: Orthopaedic Surgery

## 2020-11-25 ENCOUNTER — Other Ambulatory Visit: Payer: Self-pay

## 2020-11-25 ENCOUNTER — Other Ambulatory Visit: Payer: Self-pay | Admitting: Physician Assistant

## 2020-11-25 ENCOUNTER — Ambulatory Visit: Payer: Managed Care, Other (non HMO)

## 2020-11-25 ENCOUNTER — Encounter: Payer: Managed Care, Other (non HMO) | Admitting: Physical Therapy

## 2020-11-25 ENCOUNTER — Ambulatory Visit (INDEPENDENT_AMBULATORY_CARE_PROVIDER_SITE_OTHER): Payer: Managed Care, Other (non HMO) | Admitting: Physician Assistant

## 2020-11-25 DIAGNOSIS — M25611 Stiffness of right shoulder, not elsewhere classified: Secondary | ICD-10-CM

## 2020-11-25 DIAGNOSIS — M25511 Pain in right shoulder: Secondary | ICD-10-CM

## 2020-11-25 DIAGNOSIS — R600 Localized edema: Secondary | ICD-10-CM

## 2020-11-25 DIAGNOSIS — R293 Abnormal posture: Secondary | ICD-10-CM

## 2020-11-25 DIAGNOSIS — Z9889 Other specified postprocedural states: Secondary | ICD-10-CM

## 2020-11-25 DIAGNOSIS — M6281 Muscle weakness (generalized): Secondary | ICD-10-CM

## 2020-11-25 MED ORDER — ACETAMINOPHEN-CODEINE #3 300-30 MG PO TABS: 1.0000 | ORAL_TABLET | Freq: Three times a day (TID) | ORAL | 2 refills | Status: DC | PRN

## 2020-11-25 NOTE — Progress Notes (Signed)
Post-Op Visit Note   Patient: Tina Stephenson           Date of Birth: 1955/02/16           MRN: [id number] Visit Date: 11/25/2020 PCP: Delsa Grana, PA-C   Assessment & Plan:  Chief Complaint:  Chief Complaint  Patient presents with   Right Shoulder - Follow-up    Right shoulder arthroscopy 10/14/2020   Visit Diagnoses:  1. S/P arthroscopy of right shoulder     Plan: Patient is a pleasant 65 year old female who comes in today 6 weeks status post right shoulder arthroscopic debridement, decompression rotator cuff repair 10/14/2020.  She has been doing well.  She takes an occasional pain medication but nothing more.  She has been in physical therapy 3 times a week as well as working on a home exercise program.  She has been compliant wearing her sling as well.  Examination of her right shoulder reveals forward flexion to approximately 70 degrees.  She is neurovascular intact distally.  At this point, she is doing well.  She may formally discontinue her sling.  She will continue with physical therapy.  Follow-up with [place] in 6 weeks time for repeat evaluation.  Call with concerns or questions in the meantime.  Follow-Up Instructions: Return in about 6 weeks (around 01/06/2021).   Orders:  No orders of the defined types were placed in this encounter.  No orders of the defined types were placed in this encounter.   Imaging: No new imaging  PMFS History: Patient Active Problem List   Diagnosis Date Noted   Traumatic tear of supraspinatus tendon of right shoulder 09/24/2020   Tendinopathy of right biceps tendon 09/24/2020   Traumatic tear of supraspinatus tendon of left shoulder 09/24/2020   Leg swelling 08/02/2020   Dyslipidemia 01/05/2020   Cervical polyp 08/22/2018   Nocturia 08/22/2018   Intertrigo 08/22/2018   Type 2 diabetes mellitus without complication, without long-term current use of insulin ([place]) 08/22/2018   Cervical spine arthritis with nerve pain 06/25/2018    Herniated disc, cervical 06/25/2018   History of migraine 03/25/2018   Scoliosis 03/25/2018   History of cyst of breast 03/25/2018   Sacrum and coccyx fracture, sequela 02/21/2018   Class 3 severe obesity due to excess calories without serious comorbidity with body mass index (BMI) of 45.0 to 49.9 in adult ([place]) 02/21/2018   Cough 02/21/2018   Onychomycosis 02/21/2018   History of hypertension 02/21/2018   Vitamin D deficiency 03/19/2015   Essential hypertension 03/05/2015   Past Medical History:  Diagnosis Date   Bronchitis    Depression    Diabetes mellitus without complication ([place])    Gallbladder polyp    GERD (gastroesophageal reflux disease)    Hypertension    Hypertensive disorder 03/05/2015   Liver tumor (benign)    Sacrum and coccyx fracture ([place])    Sciatica     Family History  Problem Relation Age of Onset   COPD Mother    Cancer Mother    Heart disease Father    Breast cancer Paternal Aunt    Diabetes Brother    Diabetes Maternal Grandfather    Ovarian cancer Neg Hx    Colon cancer Neg Hx     Past Surgical History:  Procedure Laterality Date   APPENDECTOMY     NASAL SEPTUM SURGERY     WISDOM TOOTH EXTRACTION     Social History   Occupational History   Not on file  Tobacco Use  Smoking status: Never   Smokeless tobacco: Never  Vaping Use   Vaping Use: Never used  Substance and Sexual Activity   Alcohol use: Never   Drug use: Never   Sexual activity: Not Currently    Birth control/protection: None

## 2020-11-25 NOTE — Therapy (Signed)
[place] [facility] [address] [place], [address] Phone: [phone]   Fax:  [phone]  Physical Therapy Treatment  Patient Details  Name: Tina Stephenson MRN: [id number] Date of Birth: Apr 07, 1955 Referring Provider (PT): Dwana Melena, [place]   Encounter Date: 11/25/2020   PT End of Session - 11/25/20 1138     Visit Number 12    Number of Visits 24    Date for PT Re-Evaluation 01/07/21    Authorization Type [employer] Managed    Authorization Time Period 20% CO-INSURANCE, DED MET, 60 DAYS PER CALENDER YEAR AFTER 5TH VISIT, MEDICAL NESSITY NEEDED/REF#1980    Progress Note Due on Visit 65    PT Start Time 1138    PT Stop Time 1231    PT Time Calculation (min) 53 min    Activity Tolerance Patient limited by pain    Behavior During Therapy [facility] for tasks assessed/performed             Past Medical History:  Diagnosis Date   Bronchitis    Depression    Diabetes mellitus without complication ([place])    Gallbladder polyp    GERD (gastroesophageal reflux disease)    Hypertension    Hypertensive disorder 03/05/2015   Liver tumor (benign)    Sacrum and coccyx fracture ([place])    Sciatica     Past Surgical History:  Procedure Laterality Date   APPENDECTOMY     NASAL SEPTUM SURGERY     WISDOM TOOTH EXTRACTION      There were no vitals filed for this visit.   Subjective Assessment - 11/25/20 1140     Subjective Sling was discontinued. Goes back to MD office in 6 weeks. 1/10 R shoulder pain currently. Appointment went well. Was able raise her R arm up actively.    Pertinent History DM2, cervical herniated disc, scoliosis, obesity, HTN,  pt reports lumbar & cervical fractures,    Patient Stated Goals To move dominant arm, no pain, full range, return to work    Currently in Pain? Yes    Pain Score 1     Pain Onset More than a month ago                                        PT  Education - 11/25/20 1158     Education Details ther-ex    [employer]) Educated Patient    Methods Explanation;Demonstration;Tactile cues;Verbal cues    Comprehension Returned demonstration;Verbalized understanding            Objectives   Medbridge Access Code [email or web address]   Therapeutic exercises   Supine AAROM with PT             Flexion 10x3             Abduction 10x3             ER (at neutral) 10x3   Stiff end feel, no increase in pain   Seated submax/pain free level of effort shoulder isometrics in neutral per protocol, PT manual resisted               Flexion 30 seconds for 3 sets             ER 30 seconds for 3 sets             IR 30 seconds for 3 sets  Ext 30 seconds for 3 sets              Abduction 30 seconds for 3 sets    Standing B scapular retraction yellow band 10x3 with 5 second holds    Seated R shoulder AAROM table slides abduction 10x5 seconds    Improved exercise technique, movement at target joints, PROM after mod verbal, visual, tactile cues.      Ice R shoulder for 15 minutes after exercises         Response to treatment Pt tolerated session well without aggravation of symptoms.      Clinical impression Continued working on PROM/AAROM and gentle isometrics per protocol. Added yellow band resistance to scapular retraction to improve strength to promote glenohumeral control.  Pt tolerated session well without aggravation of symptoms. Pt will benefit from continued skilled physical therapy services to improve ROM, strength and function.             PT Short Term Goals - 11/22/20 1155       PT SHORT TERM GOAL #1   Title FOTO improved to 35%    Baseline Unable to access FOTO information and retrieve questionnaire for pt to fill out. (11/22/2020)    Time 4    Period Weeks    Status On-going    Target Date 11/23/20      PT SHORT TERM GOAL #2   Title right shoulder PROM flexion & abduction 100*    Baseline Supine AAROM R  shoulder 115 degrees flexion, 91 degrees abduction (11/22/2020)    Time 4    Period Weeks    Status Partially Met    Target Date 11/23/20      PT SHORT TERM GOAL #3   Title Patient is independent with initial HEP; No questions after reviewed and redemonstrated today (11/22/2020)    Time 4    Period Weeks    Status Achieved    Target Date 11/23/20               PT Long Term Goals - 11/03/20 1541       PT LONG TERM GOAL #1   Title Patient reports pain in right shoulder </= 2/10 with activities    Time 10    Period Weeks    Status New    Target Date 01/07/21      PT LONG TERM GOAL #2   Title FOTO >/= 55%    Time 10    Period Weeks    Status New    Target Date 01/07/21      PT LONG TERM GOAL #3   Title Right shoulder AROM Flexion & abduction 120* and external & internal rotation 60*    Time 10    Period Weeks    Status New    Target Date 01/07/21      PT LONG TERM GOAL #4   Title Right shoulder strength >/= 4/5    Time 10    Period Weeks    Status New    Target Date 01/07/21                   Plan - 11/25/20 1158     Clinical Impression Statement Continued working on [employer] and gentle isometrics per protocol. Added yellow band resistance to scapular retraction to improve strength to promote glenohumeral control.  Pt tolerated session well without aggravation of symptoms. Pt will benefit from continued skilled physical therapy services to improve ROM, strength and  function.    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, jewellery Low    Rehab Potential Good    PT Frequency 3x / week   3x/wk for 4 weeks, then 2x/wk for 6 weeks   PT Duration Other (comment)   10  weeks   PT Treatment/Interventions ADLs/Self Care Home Management;Cryotherapy;Electrical Stimulation;Moist Heat;Functional mobility training;Therapeutic activities;Therapeutic exercise;Balance training;Neuromuscular re-education;Patient/family education;Manual techniques;Scar mobilization;Passive range of motion;Vasopneumatic Device;Joint Manipulations    PT Next Visit Plan HEP, PROM & manual therapy to increase ROM; check measurements    PT Home Exercise Plan Access Code: [email or web address]; Medbridge Access Code [email or web address]    Consulted and Agree with Plan of Care Patient             Patient will benefit from skilled therapeutic intervention in order to improve the following deficits and impairments:  Decreased coordination, Decreased endurance, Decreased range of motion, Decreased strength, Increased edema, Increased muscle spasms, Postural dysfunction, Obesity, Pain  Visit Diagnosis: Stiffness of right shoulder, not elsewhere classified  Muscle weakness (generalized)  Acute pain of right shoulder  Abnormal posture  Localized edema     Problem List Patient Active Problem List   Diagnosis Date Noted   Traumatic tear of supraspinatus tendon of right shoulder 09/24/2020   Tendinopathy of right biceps tendon 09/24/2020   Traumatic tear of supraspinatus tendon of left shoulder 09/24/2020   Leg swelling 08/02/2020   Dyslipidemia 01/05/2020   Cervical polyp 08/22/2018   Nocturia 08/22/2018   Intertrigo 08/22/2018   Type 2 diabetes mellitus without complication, without long-term current use of insulin ([place]) 08/22/2018   Cervical spine arthritis with nerve pain 06/25/2018   Herniated disc, cervical 06/25/2018   History of migraine 03/25/2018   Scoliosis 03/25/2018   History of cyst of breast 03/25/2018   Sacrum and coccyx fracture, sequela 02/21/2018   Class 3 severe obesity due to excess calories without serious comorbidity with body mass index (BMI) of 45.0 to 49.9 in adult [facility])  02/21/2018   Cough 02/21/2018   Onychomycosis 02/21/2018   History of hypertension 02/21/2018   Vitamin D deficiency 03/19/2015   Essential hypertension 03/05/2015   Joneen Boers PT, DPT   11/25/2020, 1:17 PM  [place] [facility] [address] [place], [address] Phone: [phone]   Fax:  [phone]  Name: Hilary Milks MRN: [id number] Date of Birth: [birth year]

## 2020-11-26 ENCOUNTER — Encounter: Payer: Self-pay | Admitting: Orthopaedic Surgery

## 2020-11-27 ENCOUNTER — Encounter: Payer: Self-pay | Admitting: Family Medicine

## 2020-11-27 ENCOUNTER — Encounter: Payer: Self-pay | Admitting: Orthopaedic Surgery

## 2020-11-29 ENCOUNTER — Ambulatory Visit: Payer: Managed Care, Other (non HMO)

## 2020-11-29 ENCOUNTER — Encounter: Payer: Managed Care, Other (non HMO) | Admitting: Physical Therapy

## 2020-11-29 ENCOUNTER — Telehealth: Payer: Self-pay | Admitting: Physician Assistant

## 2020-11-29 ENCOUNTER — Encounter: Payer: Self-pay | Admitting: Orthopaedic Surgery

## 2020-11-29 DIAGNOSIS — M25611 Stiffness of right shoulder, not elsewhere classified: Secondary | ICD-10-CM | POA: Diagnosis not present

## 2020-11-29 DIAGNOSIS — R293 Abnormal posture: Secondary | ICD-10-CM

## 2020-11-29 DIAGNOSIS — M25511 Pain in right shoulder: Secondary | ICD-10-CM

## 2020-11-29 DIAGNOSIS — M6281 Muscle weakness (generalized): Secondary | ICD-10-CM

## 2020-11-29 DIAGNOSIS — R600 Localized edema: Secondary | ICD-10-CM

## 2020-11-29 NOTE — Telephone Encounter (Signed)
Unable to do PA for Tylenol # 3 through [email or web address]- states patient not found. Called [phone] do PA, they state they cannot find patient. I called patient to see if  her ins was still valid. She said yes.  Medical [id number] Tina Stephenson Rx  [id number]   She states she will send a pic of Rx ins card

## 2020-11-29 NOTE — Telephone Encounter (Signed)
Out of work until follow up appt

## 2020-11-29 NOTE — Telephone Encounter (Signed)
Please advise 

## 2020-11-29 NOTE — Therapy (Signed)
[facility] [address], [place], [address] Phone: [phone]   Fax:  [phone]  Physical Therapy Treatment  Patient Details  Name: Tina Stephenson MRN: [id number] Date of Birth: December 23, 1955 Referring Provider (PT): Dwana Melena, [place]   Encounter Date: 11/29/2020   PT End of Session - 11/29/20 0957     Visit Number 13    Number of Visits 24    Date for PT Re-Evaluation 01/07/21    Authorization Type [employer] Managed    Authorization Time Period 20% CO-INSURANCE, DED MET, 60 DAYS PER CALENDER YEAR AFTER 5TH VISIT, MEDICAL NESSITY NEEDED/REF#1980    Progress Note Due on Visit 20    PT Start Time 0930    PT Stop Time 1010    PT Time Calculation (min) 40 min    Equipment Utilized During Treatment Gait belt    Activity Tolerance Patient limited by pain;Patient tolerated treatment well;No increased pain    Behavior During Therapy WFL for tasks assessed/performed             Past Medical History:  Diagnosis Date   Bronchitis    Depression    Diabetes mellitus without complication (HCC)    Gallbladder polyp    GERD (gastroesophageal reflux disease)    Hypertension    Hypertensive disorder 03/05/2015   Liver tumor (benign)    Sacrum and coccyx fracture ([place])    Sciatica     Past Surgical History:  Procedure Laterality Date   APPENDECTOMY     NASAL SEPTUM SURGERY     WISDOM TOOTH EXTRACTION      There were no vitals filed for this visit.   Subjective Assessment - 11/29/20 0930     Subjective Pt reports some recurrent popping in anterior right shoulder during exercises. Pt has had increased pain in shoulder since, today ~5/10. No other significant updates since previous session. She switched pain medications and is having improved pain control now. PMH: DM2, cervical herniated disc, scoliosis, obesity, HTN,  pt reports lumbar & cervical fractures.    Pertinent History Moraima Burd is a 70yoF referred  by Dwana Melena, PA s/p right rotator cuff repair on 10/14/2020 c Dr. Erlinda Hong at [facility] s/p full-thickness tear of Supraspinatus. Pt also has Left supraspinatus full thickness tear, operative management awaiting full recovery from Rt surgery. She was immobilized post procedure, then on 11/17 allowed to DC the shoulder sling.    Patient Stated Goals To move dominant arm, no pain, full range, return to work    Currently in Pain? Yes    Pain Score 5     Pain Location --   Rt anterior shoulder           Objective   Medbridge Access Code [email or web address]   Therapeutic exercises Supine AAROM with PT             Flexion 10x3             Abduction 10x3             ER (at neutral) 10x3   Seated submax/pain free level of effort shoulder isometrics in neutral per protocol, PT manual resisted             Flexion 30 seconds for 3 sets             ER 30 seconds for 3 sets             IR 30 seconds for 3 sets  Ext 30 seconds for 3 sets             Abduction 30 seconds for 3 sets    Standing B scapular retraction yellow band 10x3 with 5 second holds   Seated R shoulder AAROM table slides abduction 10x5 seconds (unable for time)    Improved exercise technique, movement at target joints, PROM after mod verbal, visual, tactile cues.      Response to treatment Pt tolerated session well without aggravation of symptoms.      PT Education - 11/29/20 0937     Education Details Pt educated on true relax phase between isometrics.    Person(s) Educated Patient    Methods Explanation    Comprehension Verbalized understanding;Returned demonstration              PT Short Term Goals - 11/22/20 1155       PT SHORT TERM GOAL #1   Title FOTO improved to 35%    Baseline Unable to access FOTO information and retrieve questionnaire for pt to fill out. (11/22/2020)    Time 4    Period Weeks    Status On-going    Target Date 11/23/20      PT SHORT TERM GOAL #2   Title right shoulder PROM  flexion & abduction 100*    Baseline Supine AAROM R shoulder 115 degrees flexion, 91 degrees abduction (11/22/2020)    Time 4    Period Weeks    Status Partially Met    Target Date 11/23/20      PT SHORT TERM GOAL #3   Title Patient is independent with initial HEP; No questions after reviewed and redemonstrated today (11/22/2020)    Time 4    Period Weeks    Status Achieved    Target Date 11/23/20               PT Long Term Goals - 11/03/20 1541       PT LONG TERM GOAL #1   Title Patient reports pain in right shoulder </= 2/10 with activities    Time 10    Period Weeks    Status New    Target Date 01/07/21      PT LONG TERM GOAL #2   Title FOTO >/= 55%    Time 10    Period Weeks    Status New    Target Date 01/07/21      PT LONG TERM GOAL #3   Title Right shoulder AROM Flexion & abduction 120* and external & internal rotation 60*    Time 10    Period Weeks    Status New    Target Date 01/07/21      PT LONG TERM GOAL #4   Title Right shoulder strength >/= 4/5    Time 10    Period Weeks    Status New    Target Date 01/07/21                   Plan - 11/29/20 0957     Clinical Impression Statement Continued working on PROM/AAROM and gentle isometrics per protocol. Continued with new yellow band resistance to scapular retraction to improve strength to promote glenohumeral control. Pt tolerated session well without aggravation of symptoms. Pt will benefit from continued skilled physical therapy services to improve ROM, strength and function.    Personal Factors and Comorbidities Comorbidity 3+    Comorbidities DM2, cervical herniated disc, scoliosis, obesity, HTN,    Examination-Activity  Limitations Lift;Carry;Reach Overhead;Sleep    Examination-Participation Restrictions Occupation;Yard Work    Stability/Clinical Decision Making Stable/Uncomplicated    Clinical Decision Making Low    Rehab Potential Good    PT Frequency 3x / week    PT Duration  Other (comment)    PT Treatment/Interventions ADLs/Self Care Home Management;Cryotherapy;Electrical Stimulation;Moist Heat;Functional mobility training;Therapeutic activities;Therapeutic exercise;Balance training;Neuromuscular re-education;Patient/family education;Manual techniques;Scar mobilization;Passive range of motion;Vasopneumatic Device;Joint Manipulations    PT Next Visit Plan HEP, PROM & manual therapy to increase ROM; check measurements    PT Home Exercise Plan Access Code: [email or web address]; Medbridge Access Code [email or web address]    Consulted and Agree with Plan of Care Patient             Patient will benefit from skilled therapeutic intervention in order to improve the following deficits and impairments:  Decreased coordination, Decreased endurance, Decreased range of motion, Decreased strength, Increased edema, Increased muscle spasms, Postural dysfunction, Obesity, Pain  Visit Diagnosis: Stiffness of right shoulder, not elsewhere classified  Muscle weakness (generalized)  Acute pain of right shoulder  Abnormal posture  Localized edema     Problem List Patient Active Problem List   Diagnosis Date Noted   Traumatic tear of supraspinatus tendon of right shoulder 09/24/2020   Tendinopathy of right biceps tendon 09/24/2020   Traumatic tear of supraspinatus tendon of left shoulder 09/24/2020   Leg swelling 08/02/2020   Dyslipidemia 01/05/2020   Cervical polyp 08/22/2018   Nocturia 08/22/2018   Intertrigo 08/22/2018   Type 2 diabetes mellitus without complication, without long-term current use of insulin ([place]) 08/22/2018   Cervical spine arthritis with nerve pain 06/25/2018   Herniated disc, cervical 06/25/2018   History of migraine 03/25/2018   Scoliosis 03/25/2018   History of cyst of breast 03/25/2018   Sacrum and coccyx fracture, sequela 02/21/2018   Class 3 severe obesity due to excess calories without serious comorbidity with body mass index (BMI) of 45.0 to 49.9 in adult  ([place]) 02/21/2018   Cough 02/21/2018   Onychomycosis 02/21/2018   History of hypertension 02/21/2018   Vitamin D deficiency 03/19/2015   Essential hypertension 03/05/2015   10:09 AM, 11/29/20 Etta Grandchild, PT, DPT Physical Therapist - [place] [phone] (Office)   Estancia C, PT 11/29/2020, 10:03 AM  [facility] [address], [place], [address] Phone: [phone]   Fax:  [phone]  Name: Mackenzye Mackel MRN: [id number] Date of Birth: Sep 08, 1955

## 2020-11-29 NOTE — Telephone Encounter (Signed)
Please advise work status and provide note .

## 2020-11-30 ENCOUNTER — Ambulatory Visit: Payer: Managed Care, Other (non HMO)

## 2020-11-30 DIAGNOSIS — M25611 Stiffness of right shoulder, not elsewhere classified: Secondary | ICD-10-CM

## 2020-11-30 DIAGNOSIS — R293 Abnormal posture: Secondary | ICD-10-CM

## 2020-11-30 DIAGNOSIS — M25511 Pain in right shoulder: Secondary | ICD-10-CM

## 2020-11-30 DIAGNOSIS — R600 Localized edema: Secondary | ICD-10-CM

## 2020-11-30 DIAGNOSIS — M6281 Muscle weakness (generalized): Secondary | ICD-10-CM

## 2020-11-30 NOTE — Telephone Encounter (Signed)
Did PA today with new info. Your information has been sent to [facility].

## 2020-11-30 NOTE — Therapy (Signed)
[facility] [address] [place], [address] Phone: [phone]   Fax:  [phone]  Physical Therapy Treatment  Patient Details  Name: Tina Stephenson MRN: [id number] Date of Birth: 1955-02-10 Referring Provider (PT): Dwana Melena, [place]   Encounter Date: 11/30/2020   PT End of Session - 11/30/20 1504     Visit Number 14    Number of Visits 24    Date for PT Re-Evaluation 01/07/21    Authorization Type [employer] Managed    Authorization Time Period 20% CO-INSURANCE, DED MET, 60 DAYS PER CALENDER YEAR AFTER 5TH VISIT, MEDICAL NESSITY NEEDED/REF#1980    Progress Note Due on Visit 20    PT Start Time 1504    PT Stop Time 1558    PT Time Calculation (min) 54 min    Activity Tolerance Patient limited by pain;Patient tolerated treatment well;No increased pain    Behavior During Therapy WFL for tasks assessed/performed             Past Medical History:  Diagnosis Date   Bronchitis    Depression    Diabetes mellitus without complication (HCC)    Gallbladder polyp    GERD (gastroesophageal reflux disease)    Hypertension    Hypertensive disorder 03/05/2015   Liver tumor (benign)    Sacrum and coccyx fracture ([place])    Sciatica     Past Surgical History:  Procedure Laterality Date   APPENDECTOMY     NASAL SEPTUM SURGERY     WISDOM TOOTH EXTRACTION      There were no vitals filed for this visit.   Subjective Assessment - 11/30/20 1506     Subjective R shoulder is sore, about a 4-5/10 currently.    Pertinent History Talley Casco is a 53yoF referred by Dwana Melena, PA s/p right rotator cuff repair on 10/14/2020 c Dr. Erlinda Hong at [facility] s/p full-thickness tear of Supraspinatus. Pt also has Left supraspinatus full thickness tear, operative management awaiting full recovery from Rt surgery. She was immobilized post procedure, then on 11/17 allowed to DC the shoulder sling.    Patient Stated Goals To move  dominant arm, no pain, full range, return to work    Currently in Pain? Yes    Pain Score 5                                         PT Education - 11/30/20 1510     Education Details ther-ex    Person(s) Educated Patient    Methods Explanation;Demonstration;Tactile cues;Verbal cues    Comprehension Returned demonstration;Verbalized understanding            Objective   Medbridge Access Code [email or web address]   Pt protocol under Media section, under Physician Order on 11/10/2020  Therapeutic exercises Supine AAROM with PT R shoulder             Flexion 10x3   122 degrees supine R shoulder flexion AAROM              Abduction 10x3             ER (at neutral) 10x3   Seated submax/pain free level of effort shoulder isometrics in neutral per protocol, PT manual resisted. R shoulder             Flexion 30 seconds for 3 sets  ER 30 seconds for 3 sets             IR 30 seconds for 3 sets             Ext 30 seconds for 3 sets             Abduction 30 seconds for 3 sets   [place] AAROM R shoulder  Flexion 10x2  Abduction 7x  Scaption 10x2. More comfortable compared to abduction   Standing B scapular retraction yellow band 10x2 with 5 second holds     Improved exercise technique, movement at target joints, PROM after mod verbal, visual, tactile cues.      Response to treatment Pt tolerated session well without aggravation of symptoms.  .  Clinical impression.  Improving R shoulder AAROM observed in supine. Utilized pulleys today for AAROM to promote ability to raise her arm up against gravity.  Utilized yellow band to promote scapoular strengthening. Pt tolerated session well without aggravation of symptoms. Pt will benefit from continued skilled physical therapy services to improve ROM strength, and function.             PT Short Term Goals - 11/22/20 1155       PT SHORT TERM GOAL #1   Title FOTO improved to 35%    Baseline  Unable to access FOTO information and retrieve questionnaire for pt to fill out. (11/22/2020)    Time 4    Period Weeks    Status On-going    Target Date 11/23/20      PT SHORT TERM GOAL #2   Title right shoulder PROM flexion & abduction 100*    Baseline Supine AAROM R shoulder 115 degrees flexion, 91 degrees abduction (11/22/2020)    Time 4    Period Weeks    Status Partially Met    Target Date 11/23/20      PT SHORT TERM GOAL #3   Title Patient is independent with initial HEP; No questions after reviewed and redemonstrated today (11/22/2020)    Time 4    Period Weeks    Status Achieved    Target Date 11/23/20               PT Long Term Goals - 11/03/20 1541       PT LONG TERM GOAL #1   Title Patient reports pain in right shoulder </= 2/10 with activities    Time 10    Period Weeks    Status New    Target Date 01/07/21      PT LONG TERM GOAL #2   Title FOTO >/= 55%    Time 10    Period Weeks    Status New    Target Date 01/07/21      PT LONG TERM GOAL #3   Title Right shoulder AROM Flexion & abduction 120* and external & internal rotation 60*    Time 10    Period Weeks    Status New    Target Date 01/07/21      PT LONG TERM GOAL #4   Title Right shoulder strength >/= 4/5    Time 10    Period Weeks    Status New    Target Date 01/07/21                   Plan - 11/30/20 1532     Clinical Impression Statement Improving R shoulder AAROM observed in supine. Utilized pulleys today for AAROM to promote  ability to raise her arm up against gravity.  Utilized yellow band to promote scapoular strengthening. Pt tolerated session well without aggravation of symptoms. Pt will benefit from continued skilled physical therapy services to improve ROM strength, and function.    Personal Factors and Comorbidities Comorbidity 3+    Comorbidities DM2, cervical herniated disc, scoliosis, obesity, HTN,    Examination-Activity Limitations Lift;Carry;Reach  Overhead;Sleep    Examination-Participation Restrictions Occupation;Yard Work    Stability/Clinical Decision Making Stable/Uncomplicated    Rehab Potential Good    PT Frequency 3x / week    PT Duration Other (comment)    PT Treatment/Interventions ADLs/Self Care Home Management;Cryotherapy;Electrical Stimulation;Moist Heat;Functional mobility training;Therapeutic activities;Therapeutic exercise;Balance training;Neuromuscular re-education;Patient/family education;Manual techniques;Scar mobilization;Passive range of motion;Vasopneumatic Device;Joint Manipulations    PT Next Visit Plan HEP, PROM & manual therapy to increase ROM; check measurements    PT Home Exercise Plan Access Code: [email or web address]; Medbridge Access Code [email or web address]    Consulted and Agree with Plan of Care Patient             Patient will benefit from skilled therapeutic intervention in order to improve the following deficits and impairments:  Decreased coordination, Decreased endurance, Decreased range of motion, Decreased strength, Increased edema, Increased muscle spasms, Postural dysfunction, Obesity, Pain  Visit Diagnosis: Stiffness of right shoulder, not elsewhere classified  Muscle weakness (generalized)  Acute pain of right shoulder  Abnormal posture  Localized edema     Problem List Patient Active Problem List   Diagnosis Date Noted   Traumatic tear of supraspinatus tendon of right shoulder 09/24/2020   Tendinopathy of right biceps tendon 09/24/2020   Traumatic tear of supraspinatus tendon of left shoulder 09/24/2020   Leg swelling 08/02/2020   Dyslipidemia 01/05/2020   Cervical polyp 08/22/2018   Nocturia 08/22/2018   Intertrigo 08/22/2018   Type 2 diabetes mellitus without complication, without long-term current use of insulin ([place]) 08/22/2018   Cervical spine arthritis with nerve pain 06/25/2018   Herniated disc, cervical 06/25/2018   History of migraine 03/25/2018   Scoliosis 03/25/2018   History of  cyst of breast 03/25/2018   Sacrum and coccyx fracture, sequela 02/21/2018   Class 3 severe obesity due to excess calories without serious comorbidity with body mass index (BMI) of 45.0 to 49.9 in adult [facility]) 02/21/2018   Cough 02/21/2018   Onychomycosis 02/21/2018   History of hypertension 02/21/2018   Vitamin D deficiency 03/19/2015   Essential hypertension 03/05/2015    Joneen Boers PT, DPT   11/30/2020, 6:18 PM  [facility] [address] [place], [address] Phone: [phone]   Fax:  [phone]  Name: Tina Stephenson MRN: [id number] Date of Birth: Apr 27, 1955

## 2020-11-30 NOTE — Telephone Encounter (Signed)
This note is needed for [employer] when new form is received.

## 2020-12-01 ENCOUNTER — Ambulatory Visit: Payer: Managed Care, Other (non HMO)

## 2020-12-01 ENCOUNTER — Encounter: Payer: Managed Care, Other (non HMO) | Admitting: Physical Therapy

## 2020-12-01 ENCOUNTER — Telehealth: Payer: Self-pay | Admitting: Orthopaedic Surgery

## 2020-12-01 DIAGNOSIS — M6281 Muscle weakness (generalized): Secondary | ICD-10-CM

## 2020-12-01 DIAGNOSIS — M25611 Stiffness of right shoulder, not elsewhere classified: Secondary | ICD-10-CM

## 2020-12-01 DIAGNOSIS — M25511 Pain in right shoulder: Secondary | ICD-10-CM

## 2020-12-01 NOTE — Telephone Encounter (Signed)
Pharmacy states patient already picked up Rx 11/28/20 7-day supply. She will call us back when she needs RF but will not pick up And we will see if it will need PA.

## 2020-12-01 NOTE — Telephone Encounter (Signed)
Called patient no answer LMOM.

## 2020-12-01 NOTE — Therapy (Signed)
[facility] [address] [place], [address] Phone: [phone]   Fax:  [phone]  Physical Therapy Treatment  Patient Details  Name: Tina Stephenson MRN: [id number] Date of Birth: 1955/04/04 Referring Provider (PT): Dwana Melena, [place]   Encounter Date: 12/01/2020   PT End of Session - 12/01/20 0805     Visit Number 15    Number of Visits 24    Date for PT Re-Evaluation 01/07/21    Authorization Type [employer] Managed    Authorization Time Period 20% CO-INSURANCE, DED MET, 60 DAYS PER CALENDER YEAR AFTER 5TH VISIT, MEDICAL NESSITY NEEDED/REF#1980    Progress Note Due on Visit 20    PT Start Time 0805    PT Stop Time 0901    PT Time Calculation (min) 56 min    Activity Tolerance Patient limited by pain;Patient tolerated treatment well;No increased pain    Behavior During Therapy WFL for tasks assessed/performed             Past Medical History:  Diagnosis Date   Bronchitis    Depression    Diabetes mellitus without complication (HCC)    Gallbladder polyp    GERD (gastroesophageal reflux disease)    Hypertension    Hypertensive disorder 03/05/2015   Liver tumor (benign)    Sacrum and coccyx fracture ([place])    Sciatica     Past Surgical History:  Procedure Laterality Date   APPENDECTOMY     NASAL SEPTUM SURGERY     WISDOM TOOTH EXTRACTION      There were no vitals filed for this visit.   Subjective Assessment - 12/01/20 0807     Subjective R shoulder is sore, about 4/10 currently.    Pertinent History Tina Stephenson is a 9yoF referred by Dwana Melena, PA s/p right rotator cuff repair on 10/14/2020 c Dr. Erlinda Hong at [facility] s/p full-thickness tear of Supraspinatus. Pt also has Left supraspinatus full thickness tear, operative management awaiting full recovery from Rt surgery. She was immobilized post procedure, then on 11/17 allowed to DC the shoulder sling.    Patient Stated Goals To move dominant  arm, no pain, full range, return to work    Currently in Pain? Yes    Pain Score 4     Pain Orientation Right    Pain Descriptors / Indicators Sore                                        PT Education - 12/01/20 0859     Education Details ther-ex    Person(s) Educated Patient    Methods Explanation;Demonstration;Tactile cues;Verbal cues    Comprehension Returned demonstration;Verbalized understanding           Objective   Medbridge Access Code [email or web address]   Pt protocol under Media section, under Physician Order on 11/10/2020   Therapeutic exercises Supine AAROM with PT R shoulder, 0 to 5 second holds each position.              Flexion 10x3                         122 degrees supine R shoulder flexion AAROM              Abduction 10x3             ER (at neutral)  10x3   Seated submax/pain free level of effort shoulder isometrics in neutral per protocol, PT manual resisted. R shoulder             Flexion 30 seconds for 3 sets             ER 30 seconds for 3 sets             IR 30 seconds for 3 sets             Ext 30 seconds for 3 sets             Abduction 30 seconds for 3 sets   Seated manually resisted R scapula retraction targeting lower trap  10x5 seconds    Improved exercise technique, movement at target joints, use of target muscles after mod verbal, visual, tactile cues.    Manual therapy Seated STM R upper trap and rhomboid minor area proximally (proximal to scapula)  to decrease muscle tension      Response to treatment Pt tolerated session well without aggravation of symptoms.  .   Clinical impression.   Continued working on [employer] with more PT assist today to give R shoulder more of a break from 3 days in a row of PT session. Decreased R shoulder stiffness palpated with increased repetition and sets. Good lower trap muscle use palpated with resisted scapular retraction exercise. Improving resistance palpated with shoulder  isometric exercises, no pain. Decreased R shoulder soreness to 1-2/10 after treatment. No R shoulder joint soreness or pain after session Pt will benefit from continued skilled physical therapy services to improve ROM strength, and function.        PT Short Term Goals - 11/22/20 1155       PT SHORT TERM GOAL #1   Title FOTO improved to 35%    Baseline Unable to access FOTO information and retrieve questionnaire for pt to fill out. (11/22/2020)    Time 4    Period Weeks    Status On-going    Target Date 11/23/20      PT SHORT TERM GOAL #2   Title right shoulder PROM flexion & abduction 100*    Baseline Supine AAROM R shoulder 115 degrees flexion, 91 degrees abduction (11/22/2020)    Time 4    Period Weeks    Status Partially Met    Target Date 11/23/20      PT SHORT TERM GOAL #3   Title Patient is independent with initial HEP; No questions after reviewed and redemonstrated today (11/22/2020)    Time 4    Period Weeks    Status Achieved    Target Date 11/23/20               PT Long Term Goals - 11/03/20 1541       PT LONG TERM GOAL #1   Title Patient reports pain in right shoulder </= 2/10 with activities    Time 10    Period Weeks    Status New    Target Date 01/07/21      PT LONG TERM GOAL #2   Title FOTO >/= 55%    Time 10    Period Weeks    Status New    Target Date 01/07/21      PT LONG TERM GOAL #3   Title Right shoulder AROM Flexion & abduction 120* and external & internal rotation 60*    Time 10    Period Weeks    Status New  Target Date 01/07/21      PT LONG TERM GOAL #4   Title Right shoulder strength >/= 4/5    Time 10    Period Weeks    Status New    Target Date 01/07/21                   Plan - 12/01/20 0856     Clinical Impression Statement Continued working on AAROM with more PT assist today to give R shoulder more of a break from 3 days in a row of PT session. Decreased R shoulder stiffness palpated with increased  repetition and sets. Good lower trap muscle use palpated with resisted scapular retraction exercise. Improving resistance palpated with shoulder isometric exercises, no pain. Decreased R shoulder soreness to 1-2/10 after treatment. No R shoulder joint soreness or pain after session Pt will benefit from continued skilled physical therapy services to improve ROM strength, and function.    Personal Factors and Comorbidities Comorbidity 3+    Comorbidities DM2, cervical herniated disc, scoliosis, obesity, HTN,    Examination-Activity Limitations Lift;Carry;Reach Overhead;Sleep    Examination-Participation Restrictions Occupation;Yard Work    Stability/Clinical Decision Making Stable/Uncomplicated    Clinical Decision Making Low    Rehab Potential Good    PT Frequency 3x / week    PT Duration Other (comment)    PT Treatment/Interventions ADLs/Self Care Home Management;Cryotherapy;Electrical Stimulation;Moist Heat;Functional mobility training;Therapeutic activities;Therapeutic exercise;Balance training;Neuromuscular re-education;Patient/family education;Manual techniques;Scar mobilization;Passive range of motion;Vasopneumatic Device;Joint Manipulations    PT Next Visit Plan HEP, PROM & manual therapy to increase ROM; check measurements    PT Home Exercise Plan Access Code: [email or web address]; Medbridge Access Code [email or web address]    Consulted and Agree with Plan of Care Patient             Patient will benefit from skilled therapeutic intervention in order to improve the following deficits and impairments:  Decreased coordination, Decreased endurance, Decreased range of motion, Decreased strength, Increased edema, Increased muscle spasms, Postural dysfunction, Obesity, Pain  Visit Diagnosis: Stiffness of right shoulder, not elsewhere classified  Muscle weakness (generalized)  Acute pain of right shoulder     Problem List Patient Active Problem List   Diagnosis Date Noted   Traumatic tear of  supraspinatus tendon of right shoulder 09/24/2020   Tendinopathy of right biceps tendon 09/24/2020   Traumatic tear of supraspinatus tendon of left shoulder 09/24/2020   Leg swelling 08/02/2020   Dyslipidemia 01/05/2020   Cervical polyp 08/22/2018   Nocturia 08/22/2018   Intertrigo 08/22/2018   Type 2 diabetes mellitus without complication, without long-term current use of insulin ([place]) 08/22/2018   Cervical spine arthritis with nerve pain 06/25/2018   Herniated disc, cervical 06/25/2018   History of migraine 03/25/2018   Scoliosis 03/25/2018   History of cyst of breast 03/25/2018   Sacrum and coccyx fracture, sequela 02/21/2018   Class 3 severe obesity due to excess calories without serious comorbidity with body mass index (BMI) of 45.0 to 49.9 in adult [facility]) 02/21/2018   Cough 02/21/2018   Onychomycosis 02/21/2018   History of hypertension 02/21/2018   Vitamin D deficiency 03/19/2015   Essential hypertension 03/05/2015    Joneen Boers PT, DPT   12/01/2020, 9:10 AM  [facility] [address] [place], [address] Phone: [phone]   Fax:  [phone]  Name: Tina Stephenson MRN: [id number] Date of Birth: Nov 20, 1955

## 2020-12-01 NOTE — Telephone Encounter (Signed)
Spoke to patient. See other messages.

## 2020-12-01 NOTE — Telephone Encounter (Signed)
Checked on [place].  N/A on November 22  This medication or product is on your plan's list of covered drugs. Prior authorization is not required at this time. If your pharmacy has questions regarding the processing of your prescription, please have them call the [facility] pharmacy help desk at [phone]. **Please note: This request was submitted electronically. Formulary lowering, tiering exception, cost reduction and/or pre-benefit determination review (including prospective Medicare hospice reviews) requests cannot be requested using this method of submission. Providers contact us at [phone] for further assistance.

## 2020-12-01 NOTE — Telephone Encounter (Signed)
Pt called requesting a call back from [place]. Pt did not disclose reasoning for her call. Please call pt at [phone]

## 2020-12-05 ENCOUNTER — Encounter: Payer: Self-pay | Admitting: Family Medicine

## 2020-12-06 ENCOUNTER — Other Ambulatory Visit: Payer: Self-pay

## 2020-12-06 ENCOUNTER — Ambulatory Visit: Payer: Managed Care, Other (non HMO)

## 2020-12-06 ENCOUNTER — Encounter: Payer: Managed Care, Other (non HMO) | Admitting: Physical Therapy

## 2020-12-06 DIAGNOSIS — R293 Abnormal posture: Secondary | ICD-10-CM

## 2020-12-06 DIAGNOSIS — M25511 Pain in right shoulder: Secondary | ICD-10-CM

## 2020-12-06 DIAGNOSIS — M25611 Stiffness of right shoulder, not elsewhere classified: Secondary | ICD-10-CM

## 2020-12-06 DIAGNOSIS — M6281 Muscle weakness (generalized): Secondary | ICD-10-CM

## 2020-12-06 DIAGNOSIS — E119 Type 2 diabetes mellitus without complications: Secondary | ICD-10-CM

## 2020-12-06 MED ORDER — METFORMIN HCL 500 MG PO TABS: 1000.0000 mg | ORAL_TABLET | Freq: Two times a day (BID) | ORAL | 1 refills | Status: DC

## 2020-12-06 NOTE — Patient Instructions (Signed)
Access Code: [email or web address] URL: [email or web address] Date: 12/06/2020 Prepared by: Joneen Boers  Exercises Supine Shoulder Flexion AAROM - 2-3 x daily - 7 x weekly - 3 sets - 10 reps - 2 hold Seated Shoulder Flexion AAROM with Pulley Behind - 1 x daily - 7 x weekly - 3 sets - 10 reps Seated Shoulder Scaption AAROM with Pulley at Side - 1 x daily - 7 x weekly - 3 sets - 10 reps Seated Shoulder Abduction AAROM with Pulley Behind - 1 x daily - 7 x weekly - 3 sets - 10 reps

## 2020-12-06 NOTE — Therapy (Signed)
[facility] [address], [place], [address] Phone: [phone]   Fax:  [phone]  Physical Therapy Treatment  Patient Details  Name: Tina Stephenson MRN: [id number] Date of Birth: 26-Jun-1955 Referring Provider (PT): Tina Stephenson, [place]   Encounter Date: 12/06/2020   PT End of Session - 12/06/20 1018     Visit Number 16    Number of Visits 24    Date for PT Re-Evaluation 01/07/21    Authorization Type [employer] Managed    Authorization Time Period 20% CO-INSURANCE, DED MET, 60 DAYS PER CALENDER YEAR AFTER 5TH VISIT, MEDICAL NESSITY NEEDED/REF#1980    Progress Note Due on Visit 20    PT Start Time 1018    PT Stop Time 1120    PT Time Calculation (min) 62 min    Activity Tolerance Patient limited by pain;Patient tolerated treatment well;No increased pain    Behavior During Therapy WFL for tasks assessed/performed             Past Medical History:  Diagnosis Date   Bronchitis    Depression    Diabetes mellitus without complication (HCC)    Gallbladder polyp    GERD (gastroesophageal reflux disease)    Hypertension    Hypertensive disorder 03/05/2015   Liver tumor (benign)    Sacrum and coccyx fracture ([place])    Sciatica     Past Surgical History:  Procedure Laterality Date   APPENDECTOMY     NASAL SEPTUM SURGERY     WISDOM TOOTH EXTRACTION      There were no vitals filed for this visit.   Subjective Assessment - 12/06/20 1019     Subjective R shoulder is a 3/10 currently. Might over done a few things such as pulling boxes as well as moving furniture around. Also got a new mattress as well as made her bed (lifted mattress wiht L arm and pushed sheet under). Also helped bring containers down with R arm.    Pertinent History Tina Stephenson is a 70yoF referred by Tina Melena, PA s/p right rotator cuff repair on 10/14/2020 c Dr. Erlinda Hong at [facility] s/p full-thickness tear of Supraspinatus. Pt also has Left  supraspinatus full thickness tear, operative management awaiting full recovery from Rt surgery. She was immobilized post procedure, then on 11/17 allowed to DC the shoulder sling.    Patient Stated Goals To move dominant arm, no pain, full range, return to work    Currently in Pain? Yes    Pain Score 3                                         PT Education - 12/06/20 1047     Education Details ther-ex, HEP    Person(s) Educated Patient    Methods Explanation;Demonstration;Tactile cues;Verbal cues;Handout    Comprehension Returned demonstration;Verbalized understanding            Objective   Medbridge Access Code [email or web address]   Pt protocol under Media section, under Physician Order on 11/10/2020   Therapeutic exercises   Supine AAROM with PT R shoulder, 0 to 5 second holds each position.              Flexion 10x2             scaption 10x2             Abduction 10x2  ER (at neutral) 10x2   Seated Pulley AAROM  Flexion 10x2  Scaption 10x2  Abduction 10x2  Seated submax/pain free level of effort shoulder isometrics in neutral per protocol, PT manual resisted. R shoulder             Flexion 30 seconds for 3 sets             ER 30 seconds for 3 sets             IR 30 seconds for 3 sets             Ext 30 seconds for 3 sets             Abduction 30 seconds for 3 sets   Improved exercise technique, movement at target joints, use of target muscles after mod verbal, visual, tactile cues.    Ice x 15 min R shoulder after treatment     Manual therapy Seated STM R upper trap and rhomboid minor area proximally (proximal to scapula)  to decrease muscle tension        Response to treatment Pt tolerated session well without aggravation of symptoms.  .   Clinical impression. Continued working on R shoulder AAROM and isometric strengthening to decrease stiffness and weakness. Added pulley exercises to promote ability to raise her R UE  against gravity. Pt tolerated session well without aggravation of symptoms. Pt will benefit from continued skilled physical therapy services to improve ROM strength, and function.         PT Short Term Goals - 11/22/20 1155       PT SHORT TERM GOAL #1   Title FOTO improved to 35%    Baseline Unable to access FOTO information and retrieve questionnaire for pt to fill out. (11/22/2020)    Time 4    Period Weeks    Status On-going    Target Date 11/23/20      PT SHORT TERM GOAL #2   Title right shoulder PROM flexion & abduction 100*    Baseline Supine AAROM R shoulder 115 degrees flexion, 91 degrees abduction (11/22/2020)    Time 4    Period Weeks    Status Partially Met    Target Date 11/23/20      PT SHORT TERM GOAL #3   Title Patient is independent with initial HEP; No questions after reviewed and redemonstrated today (11/22/2020)    Time 4    Period Weeks    Status Achieved    Target Date 11/23/20               PT Long Term Goals - 11/03/20 1541       PT LONG TERM GOAL #1   Title Patient reports pain in right shoulder </= 2/10 with activities    Time 10    Period Weeks    Status New    Target Date 01/07/21      PT LONG TERM GOAL #2   Title FOTO >/= 55%    Time 10    Period Weeks    Status New    Target Date 01/07/21      PT LONG TERM GOAL #3   Title Right shoulder AROM Flexion & abduction 120* and external & internal rotation 60*    Time 10    Period Weeks    Status New    Target Date 01/07/21      PT LONG TERM GOAL #4   Title Right shoulder strength >/= 4/5  Time 10    Period Weeks    Status New    Target Date 01/07/21                   Plan - 12/06/20 1017     Clinical Impression Statement Continued working on R shoulder AAROM and isometric strengthening to decrease stiffness and weakness. Added pulley exercises to promote ability to raise her R UE against gravity. Pt tolerated session well without aggravation of symptoms. Pt  will benefit from continued skilled physical therapy services to improve ROM strength, and function.    Personal Factors and Comorbidities Comorbidity 3+    Comorbidities DM2, cervical herniated disc, scoliosis, obesity, HTN,    Examination-Activity Limitations Lift;Carry;Reach Overhead;Sleep    Examination-Participation Restrictions Occupation;Yard Work    Stability/Clinical Decision Making Stable/Uncomplicated    Rehab Potential Good    PT Frequency 3x / week    PT Duration Other (comment)    PT Treatment/Interventions ADLs/Self Care Home Management;Cryotherapy;Electrical Stimulation;Moist Heat;Functional mobility training;Therapeutic activities;Therapeutic exercise;Balance training;Neuromuscular re-education;Patient/family education;Manual techniques;Scar mobilization;Passive range of motion;Vasopneumatic Device;Joint Manipulations    PT Next Visit Plan HEP, PROM & manual therapy to increase ROM; check measurements    PT Home Exercise Plan Access Code: [email or web address]; Medbridge Access Code [email or web address]    Consulted and Agree with Plan of Care Patient             Patient will benefit from skilled therapeutic intervention in order to improve the following deficits and impairments:  Decreased coordination, Decreased endurance, Decreased range of motion, Decreased strength, Increased edema, Increased muscle spasms, Postural dysfunction, Obesity, Pain  Visit Diagnosis: Muscle weakness (generalized)  Stiffness of right shoulder, not elsewhere classified  Acute pain of right shoulder  Abnormal posture     Problem List Patient Active Problem List   Diagnosis Date Noted   Traumatic tear of supraspinatus tendon of right shoulder 09/24/2020   Tendinopathy of right biceps tendon 09/24/2020   Traumatic tear of supraspinatus tendon of left shoulder 09/24/2020   Leg swelling 08/02/2020   Dyslipidemia 01/05/2020   Cervical polyp 08/22/2018   Nocturia 08/22/2018   Intertrigo 08/22/2018   Type 2  diabetes mellitus without complication, without long-term current use of insulin ([place]) 08/22/2018   Cervical spine arthritis with nerve pain 06/25/2018   Herniated disc, cervical 06/25/2018   History of migraine 03/25/2018   Scoliosis 03/25/2018   History of cyst of breast 03/25/2018   Sacrum and coccyx fracture, sequela 02/21/2018   Class 3 severe obesity due to excess calories without serious comorbidity with body mass index (BMI) of 45.0 to 49.9 in adult [facility]) 02/21/2018   Cough 02/21/2018   Onychomycosis 02/21/2018   History of hypertension 02/21/2018   Vitamin D deficiency 03/19/2015   Essential hypertension 03/05/2015    Joneen Boers PT, DPT  12/06/2020, 11:30 AM  [facility] [address], [place], [address] Phone: [phone]   Fax:  [phone]  Name: Keltie Labell MRN: [id number] Date of Birth: 1955/08/08

## 2020-12-08 ENCOUNTER — Ambulatory Visit: Payer: Managed Care, Other (non HMO)

## 2020-12-08 ENCOUNTER — Encounter: Payer: Managed Care, Other (non HMO) | Admitting: Physical Therapy

## 2020-12-08 DIAGNOSIS — M25611 Stiffness of right shoulder, not elsewhere classified: Secondary | ICD-10-CM | POA: Diagnosis not present

## 2020-12-08 DIAGNOSIS — R293 Abnormal posture: Secondary | ICD-10-CM

## 2020-12-08 DIAGNOSIS — M25511 Pain in right shoulder: Secondary | ICD-10-CM

## 2020-12-08 DIAGNOSIS — M6281 Muscle weakness (generalized): Secondary | ICD-10-CM

## 2020-12-08 NOTE — Therapy (Signed)
[place] [facility] [address], [place], [address] Phone: [phone]   Fax:  [phone]  Physical Therapy Treatment  Patient Details  Name: Tina Stephenson MRN: [id number] Date of Birth: 13-Mar-1955 Referring Provider (PT): Dwana Melena, [place]   Encounter Date: 12/08/2020   PT End of Session - 12/08/20 0849     Visit Number 17    Number of Visits 24    Date for PT Re-Evaluation 01/07/21    Authorization Type [employer] Managed    Authorization Time Period 20% CO-INSURANCE, DED MET, 60 DAYS PER CALENDER YEAR AFTER 5TH VISIT, MEDICAL NESSITY NEEDED/REF#1980    Progress Note Due on Visit 20    PT Start Time 0849    PT Stop Time [phone]    PT Time Calculation (min) 53 min    Activity Tolerance Patient limited by pain;Patient tolerated treatment well;No increased pain    Behavior During Therapy WFL for tasks assessed/performed             Past Medical History:  Diagnosis Date   Bronchitis    Depression    Diabetes mellitus without complication (HCC)    Gallbladder polyp    GERD (gastroesophageal reflux disease)    Hypertension    Hypertensive disorder 03/05/2015   Liver tumor (benign)    Sacrum and coccyx fracture ([place])    Sciatica     Past Surgical History:  Procedure Laterality Date   APPENDECTOMY     NASAL SEPTUM SURGERY     WISDOM TOOTH EXTRACTION      There were no vitals filed for this visit.   Subjective Assessment - 12/08/20 0850     Subjective R shoulder is 1-2/10 currently. Has been taking it easy wiht her R shoulder.    Pertinent History Tina Stephenson is a 88yoF referred by Dwana Melena, PA s/p right rotator cuff repair on 10/14/2020 c Dr. Erlinda Hong at [facility] s/p full-thickness tear of Supraspinatus. Pt also has Left supraspinatus full thickness tear, operative management awaiting full recovery from Rt surgery. She was immobilized post procedure, then on 11/17 allowed to DC the shoulder sling.     Patient Stated Goals To move dominant arm, no pain, full range, return to work    Currently in Pain? Yes    Pain Score 2                                         PT Education - 12/08/20 0856     Education Details ther-ex    Person(s) Educated Patient    Methods Explanation;Demonstration;Tactile cues;Verbal cues    Comprehension Returned demonstration;Verbalized understanding             Objective   Medbridge Access Code [email or web address]   Pt protocol under Media section, under Physician Order on 11/10/2020   Therapeutic exercises     Supine AAROM with PT R shoulder, 0 to 5 second holds each position.              Flexion 10x2             scaption 10x2             Abduction 10x2             ER (at neutral) 10x2   Seated submax/pain free level of effort shoulder isometrics in neutral per protocol, PT manual resisted. R  shoulder             Ext 30 seconds for 3 sets   Then at wall/door frame   Flexion 30 seconds for 3 sets              ER 30 seconds for 3 sets               IR 30 seconds for 3 sets                         Abduction 30 seconds for 3 sets  Seated Pulley AAROM             Flexion 10x2             Scaption 10x2             Abduction 10x2      Improved exercise technique, movement at target joints, use of target muscles after mod verbal, visual, tactile cues.    Ice x 15 min R shoulder after treatment        Response to treatment Pt tolerated session well without aggravation of symptoms.     Clinical impression. Continued working on R shoulder AAROM and isometric strengthening to decrease stiffness and weakness. Demonstrates greatest limitation with abduction ROM, stiff end feel to about 80-90 degrees. Pt tolerated session well without aggravation of symptoms. Pt will benefit from continued skilled physical therapy services to improve ROM strength, and function.      PT Short Term Goals - 11/22/20 1155       PT SHORT TERM  GOAL #1   Title FOTO improved to 35%    Baseline Unable to access FOTO information and retrieve questionnaire for pt to fill out. (11/22/2020)    Time 4    Period Weeks    Status On-going    Target Date 11/23/20      PT SHORT TERM GOAL #2   Title right shoulder PROM flexion & abduction 100*    Baseline Supine AAROM R shoulder 115 degrees flexion, 91 degrees abduction (11/22/2020)    Time 4    Period Weeks    Status Partially Met    Target Date 11/23/20      PT SHORT TERM GOAL #3   Title Patient is independent with initial HEP; No questions after reviewed and redemonstrated today (11/22/2020)    Time 4    Period Weeks    Status Achieved    Target Date 11/23/20               PT Long Term Goals - 11/03/20 1541       PT LONG TERM GOAL #1   Title Patient reports pain in right shoulder </= 2/10 with activities    Time 10    Period Weeks    Status New    Target Date 01/07/21      PT LONG TERM GOAL #2   Title FOTO >/= 55%    Time 10    Period Weeks    Status New    Target Date 01/07/21      PT LONG TERM GOAL #3   Title Right shoulder AROM Flexion & abduction 120* and external & internal rotation 60*    Time 10    Period Weeks    Status New    Target Date 01/07/21      PT LONG TERM GOAL #4   Title Right shoulder strength >/= 4/5  Time 10    Period Weeks    Status New    Target Date 01/07/21                   Plan - 12/08/20 0901     Clinical Impression Statement Continued working on R shoulder AAROM and isometric strengthening to decrease stiffness and weakness. Demonstrates greatest limitation with abduction ROM, stiff end feel to about 80-90 degrees. Pt tolerated session well without aggravation of symptoms. Pt will benefit from continued skilled physical therapy services to improve ROM strength, and function.    Personal Factors and Comorbidities Comorbidity 3+    Comorbidities DM2, cervical herniated disc, scoliosis, obesity, HTN,     Examination-Activity Limitations Lift;Carry;Reach Overhead;Sleep    Examination-Participation Restrictions Occupation;Yard Work    Stability/Clinical Decision Making Stable/Uncomplicated    Clinical Decision Making Low    Rehab Potential Good    PT Frequency 3x / week    PT Duration Other (comment)    PT Treatment/Interventions ADLs/Self Care Home Management;Cryotherapy;Electrical Stimulation;Moist Heat;Functional mobility training;Therapeutic activities;Therapeutic exercise;Balance training;Neuromuscular re-education;Patient/family education;Manual techniques;Scar mobilization;Passive range of motion;Vasopneumatic Device;Joint Manipulations    PT Next Visit Plan HEP, PROM & manual therapy to increase ROM; check measurements    PT Home Exercise Plan Access Code: [email or web address]; Medbridge Access Code [email or web address]    Consulted and Agree with Plan of Care Patient             Patient will benefit from skilled therapeutic intervention in order to improve the following deficits and impairments:  Decreased coordination, Decreased endurance, Decreased range of motion, Decreased strength, Increased edema, Increased muscle spasms, Postural dysfunction, Obesity, Pain  Visit Diagnosis: Muscle weakness (generalized)  Stiffness of right shoulder, not elsewhere classified  Acute pain of right shoulder  Abnormal posture     Problem List Patient Active Problem List   Diagnosis Date Noted   Traumatic tear of supraspinatus tendon of right shoulder 09/24/2020   Tendinopathy of right biceps tendon 09/24/2020   Traumatic tear of supraspinatus tendon of left shoulder 09/24/2020   Leg swelling 08/02/2020   Dyslipidemia 01/05/2020   Cervical polyp 08/22/2018   Nocturia 08/22/2018   Intertrigo 08/22/2018   Type 2 diabetes mellitus without complication, without long-term current use of insulin ([place]) 08/22/2018   Cervical spine arthritis with nerve pain 06/25/2018   Herniated disc, cervical 06/25/2018    History of migraine 03/25/2018   Scoliosis 03/25/2018   History of cyst of breast 03/25/2018   Sacrum and coccyx fracture, sequela 02/21/2018   Class 3 severe obesity due to excess calories without serious comorbidity with body mass index (BMI) of 45.0 to 49.9 in adult [facility]) 02/21/2018   Cough 02/21/2018   Onychomycosis 02/21/2018   History of hypertension 02/21/2018   Vitamin D deficiency 03/19/2015   Essential hypertension 03/05/2015    Joneen Boers PT, DPT  12/08/2020, 10:41 AM  [facility] [place] [facility] [address] [place], [address] Phone: [phone]   Fax:  [phone]  Name: Tina Stephenson MRN: [id number] Date of Birth: [birth year]

## 2020-12-09 ENCOUNTER — Ambulatory Visit: Payer: Managed Care, Other (non HMO) | Attending: Physician Assistant

## 2020-12-09 DIAGNOSIS — M25611 Stiffness of right shoulder, not elsewhere classified: Secondary | ICD-10-CM | POA: Diagnosis present

## 2020-12-09 DIAGNOSIS — R293 Abnormal posture: Secondary | ICD-10-CM | POA: Insufficient documentation

## 2020-12-09 DIAGNOSIS — R600 Localized edema: Secondary | ICD-10-CM | POA: Diagnosis present

## 2020-12-09 DIAGNOSIS — M6281 Muscle weakness (generalized): Secondary | ICD-10-CM | POA: Insufficient documentation

## 2020-12-09 DIAGNOSIS — M25511 Pain in right shoulder: Secondary | ICD-10-CM | POA: Diagnosis present

## 2020-12-09 NOTE — Therapy (Signed)
[place] [facility] [address] [place], [address] Phone: [phone]   Fax:  [phone]  Physical Therapy Treatment  Patient Details  Name: Tina Stephenson MRN: [id number] Date of Birth: 04/23/1955 Referring Provider (PT): Dwana Melena, [place]   Encounter Date: 12/09/2020   PT End of Session - 12/09/20 1134     Visit Number 18    Number of Visits 24    Date for PT Re-Evaluation 01/07/21    Authorization Type [employer] Managed    Authorization Time Period 20% CO-INSURANCE, DED MET, 60 DAYS PER CALENDER YEAR AFTER 5TH VISIT, MEDICAL NESSITY NEEDED/REF#1980    Progress Note Due on Visit 20    PT Start Time 1135    PT Stop Time 1233    PT Time Calculation (min) 58 min    Activity Tolerance Patient limited by pain;Patient tolerated treatment well;No increased pain    Behavior During Therapy WFL for tasks assessed/performed             Past Medical History:  Diagnosis Date   Bronchitis    Depression    Diabetes mellitus without complication (HCC)    Gallbladder polyp    GERD (gastroesophageal reflux disease)    Hypertension    Hypertensive disorder 03/05/2015   Liver tumor (benign)    Sacrum and coccyx fracture ([place])    Sciatica     Past Surgical History:  Procedure Laterality Date   APPENDECTOMY     NASAL SEPTUM SURGERY     WISDOM TOOTH EXTRACTION      There were no vitals filed for this visit.   Subjective Assessment - 12/09/20 1137     Subjective R shoulder is about 1-2/10 currently.    Pertinent History Tina Stephenson is a 26yoF referred by Dwana Melena, PA s/p right rotator cuff repair on 10/14/2020 c Dr. Erlinda Hong at [facility] s/p full-thickness tear of Supraspinatus. Pt also has Left supraspinatus full thickness tear, operative management awaiting full recovery from Rt surgery. She was immobilized post procedure, then on 11/17 allowed to DC the shoulder sling.    Patient Stated Goals To move dominant arm, no  pain, full range, return to work    Currently in Pain? Yes    Pain Score 2                                         PT Education - 12/09/20 1144     Education Details ther-ex    Person(s) Educated Patient    Methods Explanation;Demonstration;Tactile cues;Verbal cues    Comprehension Returned demonstration;Verbalized understanding           Objective   Medbridge Access Code [email or web address]   Pt protocol under Media section, under Physician Order on 11/10/2020   Therapeutic exercises     Supine AAROM with PT R shoulder, 0 to 5 second holds each position.              Flexion 10x2             scaption 10x2             Abduction 10x2             ER (at neutral) 10x2   Seated submax/pain free level of effort shoulder isometrics in neutral per protocol, PT manual resisted. R shoulder  Ext 30 seconds for 3 sets               Then at wall/door frame                         Flexion 30 seconds for 3 sets                         ER 30 seconds for 3 sets                                     IR 30 seconds for 3 sets                         Abduction 30 seconds for 3 sets   Standing towel slides up wall AAROM  Flexion 5x2  Abduction 3x. Uncomfortable, exercise stopped  Seated manually resisted scapular retraction isometrics targeting lower trap muscle  R 10x5 seconds for 3 sets  Seated Pulley AAROM             Scaption 10x2             Abduction 10x2    Improved exercise technique, movement at target joints, use of target muscles after mod verbal, visual, tactile cues.    Ice x 15 min R shoulder after treatment        Response to treatment Pt tolerated session well without aggravation of symptoms.      Clinical impression. Continued working on R shoulder AAROM and isometric strengthening to decrease stiffness and weakness.  Pt tolerated session well without aggravation of symptoms. Pt will benefit from continued skilled physical therapy  services to improve ROM strength, and function.         PT Short Term Goals - 11/22/20 1155       PT SHORT TERM GOAL #1   Title FOTO improved to 35%    Baseline Unable to access FOTO information and retrieve questionnaire for pt to fill out. (11/22/2020)    Time 4    Period Weeks    Status On-going    Target Date 11/23/20      PT SHORT TERM GOAL #2   Title right shoulder PROM flexion & abduction 100*    Baseline Supine AAROM R shoulder 115 degrees flexion, 91 degrees abduction (11/22/2020)    Time 4    Period Weeks    Status Partially Met    Target Date 11/23/20      PT SHORT TERM GOAL #3   Title Patient is independent with initial HEP; No questions after reviewed and redemonstrated today (11/22/2020)    Time 4    Period Weeks    Status Achieved    Target Date 11/23/20               PT Long Term Goals - 11/03/20 1541       PT LONG TERM GOAL #1   Title Patient reports pain in right shoulder </= 2/10 with activities    Time 10    Period Weeks    Status New    Target Date 01/07/21      PT LONG TERM GOAL #2   Title FOTO >/= 55%    Time 10    Period Weeks    Status New    Target Date 01/07/21  PT LONG TERM GOAL #3   Title Right shoulder AROM Flexion & abduction 120* and external & internal rotation 60*    Time 10    Period Weeks    Status New    Target Date 01/07/21      PT LONG TERM GOAL #4   Title Right shoulder strength >/= 4/5    Time 10    Period Weeks    Status New    Target Date 01/07/21                   Plan - 12/09/20 1134     Clinical Impression Statement Continued working on R shoulder AAROM and isometric strengthening to decrease stiffness and weakness.  Pt tolerated session well without aggravation of symptoms. Pt will benefit from continued skilled physical therapy services to improve ROM strength, and function.    Personal Factors and Comorbidities Comorbidity 3+    Comorbidities DM2, cervical herniated disc,  scoliosis, obesity, HTN,    Examination-Activity Limitations Lift;Carry;Reach Overhead;Sleep    Examination-Participation Restrictions Occupation;Yard Work    Stability/Clinical Decision Making Stable/Uncomplicated    Rehab Potential Good    PT Frequency 3x / week    PT Duration Other (comment)    PT Treatment/Interventions ADLs/Self Care Home Management;Cryotherapy;Electrical Stimulation;Moist Heat;Functional mobility training;Therapeutic activities;Therapeutic exercise;Balance training;Neuromuscular re-education;Patient/family education;Manual techniques;Scar mobilization;Passive range of motion;Vasopneumatic Device;Joint Manipulations    PT Next Visit Plan HEP, PROM & manual therapy to increase ROM; check measurements    PT Home Exercise Plan Access Code: [email or web address]; Medbridge Access Code [email or web address]    Consulted and Agree with Plan of Care Patient             Patient will benefit from skilled therapeutic intervention in order to improve the following deficits and impairments:  Decreased coordination, Decreased endurance, Decreased range of motion, Decreased strength, Increased edema, Increased muscle spasms, Postural dysfunction, Obesity, Pain  Visit Diagnosis: Muscle weakness (generalized)  Stiffness of right shoulder, not elsewhere classified  Acute pain of right shoulder     Problem List Patient Active Problem List   Diagnosis Date Noted   Traumatic tear of supraspinatus tendon of right shoulder 09/24/2020   Tendinopathy of right biceps tendon 09/24/2020   Traumatic tear of supraspinatus tendon of left shoulder 09/24/2020   Leg swelling 08/02/2020   Dyslipidemia 01/05/2020   Cervical polyp 08/22/2018   Nocturia 08/22/2018   Intertrigo 08/22/2018   Type 2 diabetes mellitus without complication, without long-term current use of insulin ([place]) 08/22/2018   Cervical spine arthritis with nerve pain 06/25/2018   Herniated disc, cervical 06/25/2018   History of migraine  03/25/2018   Scoliosis 03/25/2018   History of cyst of breast 03/25/2018   Sacrum and coccyx fracture, sequela 02/21/2018   Class 3 severe obesity due to excess calories without serious comorbidity with body mass index (BMI) of 45.0 to 49.9 in adult [facility]) 02/21/2018   Cough 02/21/2018   Onychomycosis 02/21/2018   History of hypertension 02/21/2018   Vitamin D deficiency 03/19/2015   Essential hypertension 03/05/2015    Joneen Boers PT, DPT   12/09/2020, 12:29 PM  [facility] [facility] [address] [place], [address] Phone: [phone]   Fax:  [phone]  Name: Kassadee Carawan MRN: [id number] Date of Birth: 09/01/1955

## 2020-12-13 ENCOUNTER — Ambulatory Visit: Payer: Managed Care, Other (non HMO)

## 2020-12-15 ENCOUNTER — Ambulatory Visit: Payer: Managed Care, Other (non HMO)

## 2020-12-15 ENCOUNTER — Other Ambulatory Visit: Payer: Self-pay | Admitting: Family Medicine

## 2020-12-15 DIAGNOSIS — J45909 Unspecified asthma, uncomplicated: Secondary | ICD-10-CM

## 2020-12-15 NOTE — Telephone Encounter (Signed)
Last time seen in the office was by you Dr.Andrews could you help?

## 2020-12-15 NOTE — Telephone Encounter (Signed)
Requested medication (s) are due for refill today: expired medication  Requested medication (s) are on the active medication list: yes  Last refill:  11/26/19 #150 ml 0 refills  Future visit scheduled: yes in 4 months  Notes to clinic:  expired medication . Do you want to renew Rx?     Requested Prescriptions  Pending Prescriptions Disp Refills   albuterol (PROVENTIL) (2.5 MG/3ML) 0.083% nebulizer solution [Pharmacy Med Name: ALBUTEROL 0.083%(2.5MG /3ML) 25X3ML] 150 mL 0    Sig: USE 1 VIAL VIA NEBULIZER EVERY 6 HOURS AS NEEDED FOR WHEEZING OR SHORTNESS OF BREATH     Pulmonology:  Beta Agonists Failed - 12/15/2020  9:24 AM      Failed - One inhaler should last at least one month. If the patient is requesting refills earlier, contact the patient to check for uncontrolled symptoms.      Passed - Valid encounter within last 12 months    Recent Outpatient Visits           1 month ago Essential hypertension   [place], DO   2 months ago Essential hypertension   [facility] Delsa Grana, PA-C   4 months ago Essential hypertension   [place], DO   4 months ago Leg swelling   [facility] Rory Percy M, DO   7 months ago Dyslipidemia   [facility] Delsa Grana, PA-C       Future Appointments             In 4 months Delsa Grana, PA-C [facility], [facility]

## 2020-12-16 ENCOUNTER — Other Ambulatory Visit: Payer: Self-pay | Admitting: Family Medicine

## 2020-12-16 DIAGNOSIS — J45909 Unspecified asthma, uncomplicated: Secondary | ICD-10-CM

## 2020-12-16 MED ORDER — ALBUTEROL SULFATE (2.5 MG/3ML) 0.083% IN NEBU: 2.5000 mg | INHALATION_SOLUTION | Freq: Four times a day (QID) | RESPIRATORY_TRACT | 3 refills | Status: AC | PRN

## 2020-12-18 ENCOUNTER — Encounter: Payer: Self-pay | Admitting: Family Medicine

## 2020-12-20 ENCOUNTER — Ambulatory Visit: Payer: Managed Care, Other (non HMO)

## 2020-12-20 ENCOUNTER — Encounter: Payer: Self-pay | Admitting: Physician Assistant

## 2020-12-20 ENCOUNTER — Ambulatory Visit: Payer: Managed Care, Other (non HMO) | Admitting: Physician Assistant

## 2020-12-20 VITALS — BP 124/66 | HR 94 | Temp 98.2°F | Resp 16 | Ht 63.0 in | Wt 242.9 lb

## 2020-12-20 DIAGNOSIS — S90129A Contusion of unspecified lesser toe(s) without damage to nail, initial encounter: Secondary | ICD-10-CM | POA: Diagnosis not present

## 2020-12-20 DIAGNOSIS — M25511 Pain in right shoulder: Secondary | ICD-10-CM

## 2020-12-20 DIAGNOSIS — R293 Abnormal posture: Secondary | ICD-10-CM

## 2020-12-20 DIAGNOSIS — J069 Acute upper respiratory infection, unspecified: Secondary | ICD-10-CM | POA: Diagnosis not present

## 2020-12-20 DIAGNOSIS — M6281 Muscle weakness (generalized): Secondary | ICD-10-CM | POA: Diagnosis not present

## 2020-12-20 DIAGNOSIS — M25611 Stiffness of right shoulder, not elsewhere classified: Secondary | ICD-10-CM

## 2020-12-20 HISTORY — DX: Contusion of unspecified lesser toe(s) without damage to nail, initial encounter: S90.129A

## 2020-12-20 MED ORDER — BENZONATATE 100 MG PO CAPS
100.0000 mg | ORAL_CAPSULE | Freq: Two times a day (BID) | ORAL | 0 refills | Status: DC | PRN
Start: 2020-12-20 — End: 2021-01-20

## 2020-12-20 NOTE — Patient Instructions (Signed)
Please take Tessalon pearls up to twice per day as needed for cough symptoms  You can use acetaminophen or ibuprofen for your toe pain and body aches/ sinus pain if needed Continue using nebulizer to assist with shortness of breath You can use an antihistamine (loratadine, cetirizine, etc.) to help with nasal drainage instead of Sudafed.  Come back if your symptoms do not continue to improve or if they worsen in the next 5-7 days.

## 2020-12-20 NOTE — Assessment & Plan Note (Signed)
Acute bruising and mild swelling of right second toe from injury 2 days ago Education on OTC relief, heat and elevation  Provided return precautions

## 2020-12-20 NOTE — Progress Notes (Signed)
Acute Office Visit  Subjective:    Patient ID: Tina Stephenson, female    DOB: 1955/06/12, 65 y.o.   MRN: [id number]  Chief Complaint  Patient presents with   Cough    Onset for x12 days. Pt states has been coughing up yellow mucus.    HPI Patient is in today for complaints of productive cough, wheezing ongoing for 12 days She denies fever, body aches, nausea, vomiting, diarrhea, difficulty breathing Initially began as sore throat then progressed to coughing.  Currently experiencing mild SOB from coughing, fatigue, productive cough.  She has been taking hypertension safe Robitussin to ease cough without much relief.  States she has been starting to feel better today    Concern for right second toe after hitting it on a chair two nights ago States it is mildly swollen and bruised but denies pain with passive and active ROM or manipulation, walking.   Past Medical History:  Diagnosis Date   Bronchitis    Depression    Diabetes mellitus without complication (HCC)    Gallbladder polyp    GERD (gastroesophageal reflux disease)    Hypertension    Hypertensive disorder 03/05/2015   Liver tumor (benign)    Sacrum and coccyx fracture ([place])    Sciatica     Past Surgical History:  Procedure Laterality Date   APPENDECTOMY     NASAL SEPTUM SURGERY     WISDOM TOOTH EXTRACTION      Family History  Problem Relation Age of Onset   COPD Mother    Cancer Mother    Heart disease Father    Breast cancer Paternal Aunt    Diabetes Brother    Diabetes Maternal Grandfather    Ovarian cancer Neg Hx    Colon cancer Neg Hx     Social History   Socioeconomic History   Marital status: Single    Spouse name: Not on file   Number of children: 0   Years of education: Not on file   Highest education level: Not on file  Occupational History   Not on file  Tobacco Use   Smoking status: Never   Smokeless tobacco: Never  Vaping Use   Vaping Use: Never used  Substance and Sexual  Activity   Alcohol use: Never   Drug use: Never   Sexual activity: Not Currently    Birth control/protection: None  Other Topics Concern   Not on file  Social History Narrative   Not on file   Social Determinants of Health   Financial Resource Strain: Not on file  Food Insecurity: Not on file  Transportation Needs: Not on file  Physical Activity: Not on file  Stress: Not on file  Social Connections: Not on file  Intimate Partner Violence: Not on file    Outpatient Medications Prior to Visit  Medication Sig Dispense Refill   acetaminophen (TYLENOL) 500 MG tablet Take 1,000 mg by mouth 2 (two) times daily.     acetaminophen-codeine (TYLENOL #3) 300-30 MG tablet Take 1 tablet by mouth every 8 (eight) hours as needed for moderate pain. 30 tablet 2   albuterol (PROVENTIL) (2.5 MG/3ML) 0.083% nebulizer solution Take 3 mLs (2.5 mg total) by nebulization every 6 (six) hours as needed for wheezing or shortness of breath. 150 mL 3   blood glucose meter kit and supplies KIT Dispense based on patient and insurance preference. Use once daily as directed. (FOR ICD-10 E11.9). 1 each 0   glucose blood test strip Use as  instructed 100 each 1   hydrochlorothiazide (HYDRODIURIL) 12.5 MG tablet Take 1 tablet (12.5 mg total) by mouth daily. 90 tablet 0   Lancets (ONETOUCH DELICA PLUS [id number]) MISC USE TO CHECK BLOOD SUGAR TWICE DAILY 100 each 5   metFORMIN (GLUCOPHAGE) 500 MG tablet Take 2 tablets (1,000 mg total) by mouth 2 (two) times daily with a meal. 180 tablet 1   Multiple Vitamins-Minerals (MULTIVITAMIN WITH MINERALS) tablet Take 1 tablet by mouth daily.     rosuvastatin (CRESTOR) 5 MG tablet Take 1 tablet (5 mg total) by mouth daily. 90 tablet 3   SUMAtriptan (IMITREX) 25 MG tablet Take 25-50 mg po at migraine onset, and may repeat dose in 2 hours as needed for unresolved HA, Maximum daily dose 200 mg in 24 hours 20 tablet 3   terbinafine (LAMISIL) 250 MG tablet Take 1 tablet (250 mg total) by  mouth daily. 90 tablet 2   No facility-administered medications prior to visit.    Allergies  Allergen Reactions   Penicillins Other (See Comments)    "lose my hearing"    Review of Systems  Constitutional:  Positive for fatigue. Negative for chills, fever and unexpected weight change.  HENT:  Positive for congestion, postnasal drip and sinus pain. Negative for ear pain, rhinorrhea and sore throat.   Eyes:  Negative for discharge and visual disturbance.  Respiratory:  Positive for cough, shortness of breath and wheezing.   Cardiovascular:  Negative for chest pain and palpitations.  Gastrointestinal:  Negative for diarrhea, nausea and vomiting.  Musculoskeletal:  Negative for arthralgias, myalgias, neck pain and neck stiffness.       Right second toe bruising and mild swelling from recent injury.   Skin:  Negative for rash.  Neurological:  Positive for dizziness. Negative for syncope, weakness and headaches.      Objective:    Physical Exam Constitutional:      Appearance: Normal appearance. She is obese.  HENT:     Head: Normocephalic.     Right Ear: Hearing, ear canal and external ear normal.     Left Ear: Hearing, tympanic membrane, ear canal and external ear normal.     Ears:     Comments: Ear was buildup in right ear    Nose:     Right Turbinates: Enlarged.     Left Turbinates: Enlarged.     Mouth/Throat:     Mouth: Mucous membranes are moist.     Pharynx: Oropharynx is clear. Uvula midline. No oropharyngeal exudate or posterior oropharyngeal erythema.     Tonsils: No tonsillar exudate or tonsillar abscesses.  Eyes:     General: Lids are normal.  Cardiovascular:     Rate and Rhythm: Normal rate and regular rhythm.     Pulses: Normal pulses.     Heart sounds: Normal heart sounds.  Pulmonary:     Effort: Pulmonary effort is normal.     Breath sounds: Normal breath sounds and air entry. No stridor or decreased air movement. No decreased breath sounds.   Musculoskeletal:     Cervical back: Normal range of motion.     Right foot: Swelling present.       Feet:  Neurological:     Mental Status: She is alert.    BP 124/66   Pulse 94   Temp 98.2 F (36.8 C) (Oral)   Resp 16   Ht _0  (1.6 m)   Wt 242 lb 14.4 oz (110.2 kg)   SpO2 97%  BMI 43.03 kg/m  Wt Readings from Last 3 Encounters:  12/20/20 242 lb 14.4 oz (110.2 kg)  11/05/20 248 lb 11.2 oz (112.8 kg)  09/24/20 251 lb 3.2 oz (113.9 kg)    Health Maintenance Due  Topic Date Due   COVID-19 Vaccine (1) Never done   OPHTHALMOLOGY EXAM  09/16/2020   MAMMOGRAM  11/05/2020    There are no preventive care reminders to display for this patient.   Lab Results  Component Value Date   TSH 1.940 08/02/2018   Lab Results  Component Value Date   WBC 9.0 08/02/2020   HGB 12.5 08/02/2020   HCT 38.5 08/02/2020   MCV 89 08/02/2020   PLT 321 08/02/2020   Lab Results  Component Value Date   NA 141 11/08/2020   K 4.7 11/08/2020   CO2 27 11/08/2020   GLUCOSE 102 (H) 11/08/2020   BUN 19 11/08/2020   CREATININE 0.80 11/08/2020   BILITOT 0.9 11/08/2020   ALKPHOS 92 11/08/2020   AST 33 11/08/2020   ALT 34 (H) 11/08/2020   PROT 6.9 11/08/2020   ALBUMIN 4.9 (H) 11/08/2020   CALCIUM 10.1 11/08/2020   EGFR 83 11/08/2020   Lab Results  Component Value Date   CHOL 115 05/07/2020   Lab Results  Component Value Date   HDL 44 05/07/2020   Lab Results  Component Value Date   LDLCALC 47 05/07/2020   Lab Results  Component Value Date   TRIG 142 05/07/2020   Lab Results  Component Value Date   CHOLHDL 2.6 05/07/2020   Lab Results  Component Value Date   HGBA1C 6.4 (H) 09/24/2020       Assessment & Plan:   Problem List Items Addressed This Visit       Respiratory   Upper respiratory infection, acute    Acute upper respiratory infection likely viral in etiology  Provided Tessalon pearls for cough symptoms  Provided options for OTC management with Tylenol,  Ibuprofen, and antihistamine for lingering symptoms Patient has nebulizer at home that she is using to help with SOB from coughing.  Return precautions if symptoms worsen or do not continue improving.          Relevant Medications   benzonatate (TESSALON) 100 MG capsule     Other   Bruised toe    Acute bruising and mild swelling of right second toe from injury 2 days ago Education on OTC relief, heat and elevation  Provided return precautions       Problem List Items Addressed This Visit       Respiratory   Upper respiratory infection, acute    Acute upper respiratory infection likely viral in etiology  Provided Tessalon pearls for cough symptoms  Provided options for OTC management with Tylenol, Ibuprofen, and antihistamine for lingering symptoms Patient has nebulizer at home that she is using to help with SOB from coughing.  Return precautions if symptoms worsen or do not continue improving.          Relevant Medications   benzonatate (TESSALON) 100 MG capsule     Other   Bruised toe    Acute bruising and mild swelling of right second toe from injury 2 days ago Education on OTC relief, heat and elevation  Provided return precautions         No follow-ups on file.   I, Jenaveve Fenstermaker E Dessie Delcarlo, PA-C, have reviewed all documentation for this visit. The documentation on 12/20/20 for the exam, diagnosis, procedures, and  orders are all accurate and complete.   Kierah Goatley, Dani Gobble, PA-C MPH [facility]   Meds ordered this encounter  Medications   benzonatate (TESSALON) 100 MG capsule    Sig: Take 1 capsule (100 mg total) by mouth 2 (two) times daily as needed for cough.    Dispense:  20 capsule    Refill:  0     Legacy Lacivita E Janean Eischen, PA-C

## 2020-12-20 NOTE — Therapy (Signed)
[facility] [address] [place], [address] Phone: [phone]   Fax:  [phone]  Physical Therapy Treatment  Patient Details  Name: Tina Stephenson MRN: [id number] Date of Birth: 1955-05-09 Referring Provider (PT): Dwana Melena, [place]   Encounter Date: 12/20/2020   PT End of Session - 12/20/20 1418     Visit Number 19    Number of Visits 24    Date for PT Re-Evaluation 01/07/21    Authorization Type [employer] Managed    Authorization Time Period 20% CO-INSURANCE, DED MET, 60 DAYS PER CALENDER YEAR AFTER 5TH VISIT, MEDICAL NESSITY NEEDED/REF#[id number]    Progress Note Due on Visit 20    PT Start Time 1419    PT Stop Time 1459    PT Time Calculation (min) 40 min    Activity Tolerance Patient limited by pain;Patient tolerated treatment well;No increased pain    Behavior During Therapy WFL for tasks assessed/performed             Past Medical History:  Diagnosis Date   Bronchitis    Depression    Diabetes mellitus without complication (HCC)    Gallbladder polyp    GERD (gastroesophageal reflux disease)    Hypertension    Hypertensive disorder 03/05/2015   Liver tumor (benign)    Sacrum and coccyx fracture ([place])    Sciatica     Past Surgical History:  Procedure Laterality Date   APPENDECTOMY     NASAL SEPTUM SURGERY     WISDOM TOOTH EXTRACTION      There were no vitals filed for this visit.   Subjective Assessment - 12/20/20 1420     Subjective Has not been able to do HEP because she spent most of the time in bed. Saw the doctor this morning, still coughing but not contageous. 1/10 currently, 2/10 when raising her arm up.    Pertinent History Tina Stephenson is a 19yoF referred by Dwana Melena, PA s/p right rotator cuff repair on 10/14/2020 c Dr. Erlinda Hong at [facility] s/p full-thickness tear of Supraspinatus. Pt also has Left supraspinatus full thickness tear, operative management awaiting full recovery from  Rt surgery. She was immobilized post procedure, then on 11/17 allowed to DC the shoulder sling.    Patient Stated Goals To move dominant arm, no pain, full range, return to work    Currently in Pain? Yes    Pain Score 2                                         PT Education - 12/20/20 1429     Education Details ther-ex    Person(s) Educated Patient    Methods Explanation;Demonstration;Tactile cues;Verbal cues    Comprehension Returned demonstration;Verbalized understanding            Objective   Medbridge Access Code [email or web address]   Pt protocol under Media section, under Physician Order on 11/10/2020   Therapeutic exercises     Supine AAROM with PT R shoulder, 0 to 5 second holds each position.              Flexion 10x2             scaption 10x2             Abduction 10x2             ER (at neutral)  10x2   Supine shoulder AROM with short lever arm to start  Flexion 10x2, cues for scapular retraction   Scaption 10x2 AROM with AAROM to guide motion  L S/L R shoulder ER 10x   Seated manually resisted scapular retraction isometrics targeting lower trap muscle             R 10x5 seconds for 3 sets   Seated PT assist AROM   Flexion 5x  Scaption 4x   Seated manually resisted R triceps extension isometrics in neutral 10x5 seconds       Improved exercise technique, movement at target joints, use of target muscles after mod verbal, visual, tactile cues.        Response to treatment Pt tolerated session well without aggravation of symptoms.      Clinical impression. Stiff end feel. Continued working on AAROM to decrease stiffness. Added supine AROM flexion and scaption to promote ability to raise her arm up against gravity. Pt tolerated session well without aggravation of symptoms. Pt will benefit from continued skilled physical therapy services to improve ROM strength, and function.       PT Short Term Goals - 11/22/20 1155       PT  SHORT TERM GOAL #1   Title FOTO improved to 35%    Baseline Unable to access FOTO information and retrieve questionnaire for pt to fill out. (11/22/2020)    Time 4    Period Weeks    Status On-going    Target Date 11/23/20      PT SHORT TERM GOAL #2   Title right shoulder PROM flexion & abduction 100*    Baseline Supine AAROM R shoulder 115 degrees flexion, 91 degrees abduction (11/22/2020)    Time 4    Period Weeks    Status Partially Met    Target Date 11/23/20      PT SHORT TERM GOAL #3   Title Patient is independent with initial HEP; No questions after reviewed and redemonstrated today (11/22/2020)    Time 4    Period Weeks    Status Achieved    Target Date 11/23/20               PT Long Term Goals - 11/03/20 1541       PT LONG TERM GOAL #1   Title Patient reports pain in right shoulder </= 2/10 with activities    Time 10    Period Weeks    Status New    Target Date 01/07/21      PT LONG TERM GOAL #2   Title FOTO >/= 55%    Time 10    Period Weeks    Status New    Target Date 01/07/21      PT LONG TERM GOAL #3   Title Right shoulder AROM Flexion & abduction 120* and external & internal rotation 60*    Time 10    Period Weeks    Status New    Target Date 01/07/21      PT LONG TERM GOAL #4   Title Right shoulder strength >/= 4/5    Time 10    Period Weeks    Status New    Target Date 01/07/21                   Plan - 12/20/20 1442     Clinical Impression Statement Stiff end feel. Continued working on AAROM to decrease stiffness. Added supine AROM flexion and scaption to  promote ability to raise her arm up against gravity. Pt tolerated session well without aggravation of symptoms. Pt will benefit from continued skilled physical therapy services to improve ROM strength, and function.    Personal Factors and Comorbidities Comorbidity 3+    Comorbidities DM2, cervical herniated disc, scoliosis, obesity, HTN,    Examination-Activity  Limitations Lift;Carry;Reach Overhead;Sleep    Examination-Participation Restrictions Occupation;Yard Work    Stability/Clinical Decision Making Stable/Uncomplicated    Clinical Decision Making Low    Rehab Potential Good    PT Frequency 3x / week    PT Duration Other (comment)    PT Treatment/Interventions ADLs/Self Care Home Management;Cryotherapy;Electrical Stimulation;Moist Heat;Functional mobility training;Therapeutic activities;Therapeutic exercise;Balance training;Neuromuscular re-education;Patient/family education;Manual techniques;Scar mobilization;Passive range of motion;Vasopneumatic Device;Joint Manipulations    PT Next Visit Plan HEP, PROM & manual therapy to increase ROM; check measurements    PT Home Exercise Plan Access Code: [email or web address]; Medbridge Access Code [email or web address]    Consulted and Agree with Plan of Care Patient             Patient will benefit from skilled therapeutic intervention in order to improve the following deficits and impairments:  Decreased coordination, Decreased endurance, Decreased range of motion, Decreased strength, Increased edema, Increased muscle spasms, Postural dysfunction, Obesity, Pain  Visit Diagnosis: Muscle weakness (generalized)  Stiffness of right shoulder, not elsewhere classified  Acute pain of right shoulder  Abnormal posture     Problem List Patient Active Problem List   Diagnosis Date Noted   Upper respiratory infection, acute 12/20/2020   Bruised toe 12/20/2020   Traumatic tear of supraspinatus tendon of right shoulder 09/24/2020   Tendinopathy of right biceps tendon 09/24/2020   Traumatic tear of supraspinatus tendon of left shoulder 09/24/2020   Leg swelling 08/02/2020   Dyslipidemia 01/05/2020   Cervical polyp 08/22/2018   Nocturia 08/22/2018   Intertrigo 08/22/2018   Type 2 diabetes mellitus without complication, without long-term current use of insulin ([place]) 08/22/2018   Cervical spine arthritis with nerve pain  06/25/2018   Herniated disc, cervical 06/25/2018   History of migraine 03/25/2018   Scoliosis 03/25/2018   History of cyst of breast 03/25/2018   Sacrum and coccyx fracture, sequela 02/21/2018   Class 3 severe obesity due to excess calories without serious comorbidity with body mass index (BMI) of 45.0 to 49.9 in adult [facility]) 02/21/2018   Cough 02/21/2018   Onychomycosis 02/21/2018   History of hypertension 02/21/2018   Vitamin D deficiency 03/19/2015   Essential hypertension 03/05/2015   Joneen Boers PT, DPT    12/20/2020, 5:03 PM  [facility] [address], [place], [address] Phone: [phone]   Fax:  [phone]  Name: Mechelle Pates MRN: [id number] Date of Birth: [birth year]

## 2020-12-20 NOTE — Assessment & Plan Note (Addendum)
Acute upper respiratory infection likely viral in etiology  Provided Tessalon pearls for cough symptoms  Provided options for OTC management with Tylenol, Ibuprofen, and antihistamine for lingering symptoms Patient has nebulizer at home that she is using to help with SOB from coughing.  Return precautions if symptoms worsen or do not continue improving.

## 2020-12-22 ENCOUNTER — Ambulatory Visit: Payer: Managed Care, Other (non HMO)

## 2020-12-22 DIAGNOSIS — M25611 Stiffness of right shoulder, not elsewhere classified: Secondary | ICD-10-CM

## 2020-12-22 DIAGNOSIS — M25511 Pain in right shoulder: Secondary | ICD-10-CM

## 2020-12-22 DIAGNOSIS — R293 Abnormal posture: Secondary | ICD-10-CM

## 2020-12-22 DIAGNOSIS — M6281 Muscle weakness (generalized): Secondary | ICD-10-CM

## 2020-12-22 DIAGNOSIS — R600 Localized edema: Secondary | ICD-10-CM

## 2020-12-22 NOTE — Therapy (Signed)
[facility] [address], [place], [address] Phone: [phone]   Fax:  [phone]  Physical Therapy Treatment And Progress Report  (11/22/2020 - 12/22/2020)  Patient Details  Name: Tina Stephenson MRN: [id number] Date of Birth: 05/25/1955 Referring Provider (PT): Dwana Melena, [place]   Encounter Date: 12/22/2020   PT End of Session - 12/22/20 1412     Visit Number 20    Number of Visits 24    Date for PT Re-Evaluation 01/07/21    Authorization Type [employer] Managed    Authorization Time Period 20% CO-INSURANCE, DED MET, 60 DAYS PER CALENDER YEAR AFTER 5TH VISIT, MEDICAL NESSITY NEEDED/REF#1980    Progress Note Due on Visit 73    PT Start Time 1412    PT Stop Time 1503    PT Time Calculation (min) 51 min    Activity Tolerance Patient tolerated treatment well    Behavior During Therapy [facility] for tasks assessed/performed             Past Medical History:  Diagnosis Date   Bronchitis    Depression    Diabetes mellitus without complication ([place])    Gallbladder polyp    GERD (gastroesophageal reflux disease)    Hypertension    Hypertensive disorder 03/05/2015   Liver tumor (benign)    Sacrum and coccyx fracture ([place])    Sciatica     Past Surgical History:  Procedure Laterality Date   APPENDECTOMY     NASAL SEPTUM SURGERY     WISDOM TOOTH EXTRACTION      There were no vitals filed for this visit.   Subjective Assessment - 12/22/20 1413     Subjective R shoulder is a 2/10 currently,    Pertinent History Tina Stephenson is a 20yoF referred by Dwana Melena, PA s/p right rotator cuff repair on 10/14/2020 c Dr. Erlinda Hong at [facility] s/p full-thickness tear of Supraspinatus. Pt also has Left supraspinatus full thickness tear, operative management awaiting full recovery from Rt surgery. She was immobilized post procedure, then on 11/17 allowed to DC the shoulder sling.    Patient Stated Goals To move dominant arm,  no pain, full range, return to work    Currently in Pain? Yes    Pain Score 2                                         PT Education - 12/22/20 1517     Education Details ther-ex    Person(s) Educated Patient    Methods Explanation;Demonstration;Tactile cues;Verbal cues    Comprehension Returned demonstration;Verbalized understanding           Objective   Medbridge Access Code [email or web address]   Pt protocol under Media section, under Physician Order on 11/10/2020   January 05, 2021 next MD appointment   Therapeutic exercises   Standing R shoulder AROM flexion, abduction, supine R shoulder ER, IR 1x each way    Supine AAROM with PT R shoulder, 0 to 5 second holds each position.              Flexion 10x2             scaption 10x2             Abduction 10x2             ER (at neutral) 10x2   Supine  shoulder AROM with short lever arm to start             Flexion 10x2, cues for scapular retraction              Scaption 10x2 AROM with AAROM to guide motion   L S/L R shoulder ER 10x2   Seated manually resisted R triceps extension isometrics in neutral 10x5 seconds for 2 sets  Seated manually resisted scapular retraction isometrics targeting lower trap muscle             R 10x5 seconds for 3 sets   Seated PT AAROM              Flexion 5x             Scaption 5x   R shoulder AROM after session   Flexion 95 degrees    Abduction 72 degrees   Improved exercise technique, movement at target joints, use of target muscles after mod verbal, visual, tactile cues.        Response to treatment Pt tolerated session well without aggravation of symptoms.      Clinical impression. Pt demonstrates similar AAROM compared to previous measurements in which the time period when pt was sick and was unable to perform her ROM HEP may have played a factor. Stiff end feel. Worked in decreasing stiffness and improving ROM, scapular strength and glenohumeral  mechanics. Standing R shoulder AROM improved to 95 degrees flexion, and 72 degrees abduction after session. Pt tolerated session very well without aggravation of symptoms. Pt will benefit from continued skilled physical therapy services to improve ROM strength, and function.      PT Short Term Goals - 12/22/20 1426       PT SHORT TERM GOAL #1   Title FOTO improved to 35%    Baseline Unable to access FOTO information and retrieve questionnaire for pt to fill out. (11/22/2020)    Time 4    Period Weeks    Status On-going    Target Date 11/23/20      PT SHORT TERM GOAL #2   Title right shoulder PROM flexion & abduction 100*    Baseline Supine AAROM R shoulder 115 degrees flexion, 91 degrees abduction (11/22/2020); supine AAROM R shoulder flexion 120 degrees, abduction 97 degrees (12/22/2020)    Time 4    Period Weeks    Status Partially Met    Target Date 11/23/20      PT SHORT TERM GOAL #3   Title Patient is independent with initial HEP; No questions after reviewed and redemonstrated today (11/22/2020)    Time 4    Period Weeks    Status Achieved    Target Date 11/23/20               PT Long Term Goals - 12/22/20 1414       PT LONG TERM GOAL #1   Title Patient reports pain in right shoulder </= 2/10 with activities    Baseline 3/10 at most for the past 7 days (12/22/2020)    Time 10    Period Weeks    Status On-going    Target Date 01/07/21      PT LONG TERM GOAL #2   Title FOTO >/= 55%    Time 10    Period Weeks    Status Unable to assess    Target Date 01/07/21      PT LONG TERM GOAL #3   Title Right shoulder AROM  Flexion & abduction 120* and external & internal rotation 60*    Baseline R shoulder AROM flexion 73 degrees (improved to 95 degrees after session), abduction 55 degrees (improved to 72 degrees after session), ER 26 degrees, IR 37 degrees (rotation ROM at 80-90 degrees abduction position) (12/22/2020)    Time 10    Period Weeks    Status On-going     Target Date 01/07/21      PT LONG TERM GOAL #4   Title Right shoulder strength >/= 4/5    Baseline R shoulder strength about 3-/5 secondary to limites AROM. (12/22/2020)    Time 10    Period Weeks    Status On-going    Target Date 01/07/21                   Plan - 12/22/20 1409     Clinical Impression Statement Pt demonstrates similar AAROM compared to previous measurements in which the time period when pt was sick and was unable to perform her ROM HEP may have played a factor. Stiff end feel. Worked in decreasing stiffness and improving ROM, scapular strength and glenohumeral mechanics. Standing R shoulder AROM improved to 95 degrees flexion, and 72 degrees abduction after session. Pt tolerated session very well without aggravation of symptoms. Pt will benefit from continued skilled physical therapy services to improve ROM strength, and function.    Personal Factors and Comorbidities Comorbidity 3+    Comorbidities DM2, cervical herniated disc, scoliosis, obesity, HTN,    Examination-Activity Limitations Lift;Carry;Reach Overhead;Sleep    Examination-Participation Restrictions Occupation;Yard Work    Stability/Clinical Decision Making Stable/Uncomplicated    Clinical Decision Making Low    Rehab Potential Good    PT Frequency 3x / week    PT Duration Other (comment)    PT Treatment/Interventions ADLs/Self Care Home Management;Cryotherapy;Electrical Stimulation;Moist Heat;Functional mobility training;Therapeutic activities;Therapeutic exercise;Balance training;Neuromuscular re-education;Patient/family education;Manual techniques;Scar mobilization;Passive range of motion;Vasopneumatic Device;Joint Manipulations    PT Next Visit Plan HEP, PROM & manual therapy to increase ROM; check measurements    PT Home Exercise Plan Access Code: [email or web address]; Medbridge Access Code [email or web address]    Consulted and Agree with Plan of Care Patient             Patient will benefit from skilled  therapeutic intervention in order to improve the following deficits and impairments:  Decreased coordination, Decreased endurance, Decreased range of motion, Decreased strength, Increased edema, Increased muscle spasms, Postural dysfunction, Obesity, Pain  Visit Diagnosis: Muscle weakness (generalized)  Stiffness of right shoulder, not elsewhere classified  Acute pain of right shoulder  Abnormal posture  Localized edema     Problem List Patient Active Problem List   Diagnosis Date Noted   Upper respiratory infection, acute 12/20/2020   Bruised toe 12/20/2020   Traumatic tear of supraspinatus tendon of right shoulder 09/24/2020   Tendinopathy of right biceps tendon 09/24/2020   Traumatic tear of supraspinatus tendon of left shoulder 09/24/2020   Leg swelling 08/02/2020   Dyslipidemia 01/05/2020   Cervical polyp 08/22/2018   Nocturia 08/22/2018   Intertrigo 08/22/2018   Type 2 diabetes mellitus without complication, without long-term current use of insulin ([place]) 08/22/2018   Cervical spine arthritis with nerve pain 06/25/2018   Herniated disc, cervical 06/25/2018   History of migraine 03/25/2018   Scoliosis 03/25/2018   History of cyst of breast 03/25/2018   Sacrum and coccyx fracture, sequela 02/21/2018   Class 3 severe obesity due to excess calories without serious comorbidity with body mass  index (BMI) of 45.0 to 49.9 in adult [facility]) 02/21/2018   Cough 02/21/2018   Onychomycosis 02/21/2018   History of hypertension 02/21/2018   Vitamin D deficiency 03/19/2015   Essential hypertension 03/05/2015    Thank you for your referral.  Joneen Boers PT, DPT   12/22/2020, 3:17 PM  [facility] [address], [place], [address] Phone: [phone]   Fax:  [phone]  Name: Nari Vannatter MRN: [id number] Date of Birth: 1955-02-23

## 2020-12-23 ENCOUNTER — Encounter: Payer: Self-pay | Admitting: Orthopaedic Surgery

## 2020-12-24 ENCOUNTER — Encounter: Payer: Self-pay | Admitting: Orthopaedic Surgery

## 2020-12-27 ENCOUNTER — Ambulatory Visit: Payer: Managed Care, Other (non HMO)

## 2020-12-27 DIAGNOSIS — M25611 Stiffness of right shoulder, not elsewhere classified: Secondary | ICD-10-CM

## 2020-12-27 DIAGNOSIS — R293 Abnormal posture: Secondary | ICD-10-CM

## 2020-12-27 DIAGNOSIS — M25511 Pain in right shoulder: Secondary | ICD-10-CM

## 2020-12-27 DIAGNOSIS — M6281 Muscle weakness (generalized): Secondary | ICD-10-CM

## 2020-12-27 NOTE — Therapy (Signed)
[facility] [address] [place], [address] Phone: [phone]   Fax:  [phone]  Physical Therapy Treatment  Patient Details  Name: Tina Stephenson MRN: [id number] Date of Birth: [birth year] Referring Provider (PT): Dwana Melena, [place]   Encounter Date: 12/27/2020   PT End of Session - 12/27/20 1417     Visit Number 21    Number of Visits 24    Date for PT Re-Evaluation 01/07/21    Authorization Type [employer] Managed    Authorization Time Period 20% CO-INSURANCE, DED MET, 60 DAYS PER CALENDER YEAR AFTER 5TH VISIT, MEDICAL NESSITY NEEDED/REF#1980    Progress Note Due on Visit 35    PT Start Time 1416    PT Stop Time 1455    PT Time Calculation (min) 39 min    Activity Tolerance Patient tolerated treatment well    Behavior During Therapy WFL for tasks assessed/performed             Past Medical History:  Diagnosis Date   Bronchitis    Depression    Diabetes mellitus without complication ([place])    Gallbladder polyp    GERD (gastroesophageal reflux disease)    Hypertension    Hypertensive disorder 03/05/2015   Liver tumor (benign)    Sacrum and coccyx fracture ([place])    Sciatica     Past Surgical History:  Procedure Laterality Date   APPENDECTOMY     NASAL SEPTUM SURGERY     WISDOM TOOTH EXTRACTION      There were no vitals filed for this visit.   Subjective Assessment - 12/27/20 1418     Subjective R shoulder is a 2-3/10 currently. Was pretty good after last session.    Pertinent History Tina Stephenson is a 15yoF referred by Dwana Melena, PA s/p right rotator cuff repair on 10/14/2020 c Dr. Erlinda Hong [facility] s/p full-thickness tear of Supraspinatus. Pt also has Left supraspinatus full thickness tear, operative management awaiting full recovery from Rt surgery. She was immobilized post procedure, then on 11/17 allowed to DC the shoulder sling.    Patient Stated Goals To move dominant arm, no pain,  full range, return to work    Currently in Pain? Yes    Pain Score 3                                         PT Education - 12/27/20 1434     Education Details ther-ex    Person(s) Educated Patient    Methods Explanation;Demonstration;Tactile cues;Verbal cues    Comprehension Returned demonstration;Verbalized understanding            Objective   Medbridge Access Code [email or web address]   Pt protocol under Media section, under Physician Order on 11/10/2020   January 05, 2021 next MD appointment     Therapeutic exercises      Supine AAROM with PT R shoulder, 0 to 5 second holds each position.              Flexion 10x2             scaption 10x2             Abduction 10x2             ER in scapular plan 10x2   IR in scapular plane 10x2   Supine shoulder AROM with short  lever arm to start             Flexion 10x2, cues for scapular retraction              Scaption 10x AROM with AAROM to guide motion   Supine R shoulder ER isometrics in neutral, elbow bent to 90 degrees flexion 10x5 seconds for 2 sets  Supine R elbow extension isometrics, elbow bend to 90 degrees 10x2 with 5 second holds  Seated manually resisted scapular retraction isometrics targeting lower trap muscle             R 10x5 seconds for 3 sets  Seated manually resisted R triceps extension isometrics in neutral 10x5 seconds for 2 sets    Seated AAROM with PT, emphasis on proper scapular mechanics             Flexion 5x, then 3x             Scaption 5x      Improved exercise technique, movement at target joints, use of target muscles after mod verbal, visual, tactile cues.        Response to treatment Pt tolerated session well without aggravation of symptoms.      Clinical impression. Continued working on [employer] all planes to decrease stiffness as well as scapular, ER, and posterior muscle strengthening improve ability to raise her arm up against gravity with less difficulty.  Pt tolerated session very well without aggravation of symptoms. Pt will benefit from continued skilled physical therapy services to improve ROM strength, and function.      PT Short Term Goals - 12/22/20 1426       PT SHORT TERM GOAL #1   Title FOTO improved to 35%    Baseline Unable to access FOTO information and retrieve questionnaire for pt to fill out. (11/22/2020)    Time 4    Period Weeks    Status On-going    Target Date 11/23/20      PT SHORT TERM GOAL #2   Title right shoulder PROM flexion & abduction 100*    Baseline Supine AAROM R shoulder 115 degrees flexion, 91 degrees abduction (11/22/2020); supine AAROM R shoulder flexion 120 degrees, abduction 97 degrees (12/22/2020)    Time 4    Period Weeks    Status Partially Met    Target Date 11/23/20      PT SHORT TERM GOAL #3   Title Patient is independent with initial HEP; No questions after reviewed and redemonstrated today (11/22/2020)    Time 4    Period Weeks    Status Achieved    Target Date 11/23/20               PT Long Term Goals - 12/22/20 1414       PT LONG TERM GOAL #1   Title Patient reports pain in right shoulder </= 2/10 with activities    Baseline 3/10 at most for the past 7 days (12/22/2020)    Time 10    Period Weeks    Status On-going    Target Date 01/07/21      PT LONG TERM GOAL #2   Title FOTO >/= 55%    Time 10    Period Weeks    Status Unable to assess    Target Date 01/07/21      PT LONG TERM GOAL #3   Title Right shoulder AROM Flexion & abduction 120* and external & internal rotation 60*    Baseline  R shoulder AROM flexion 73 degrees (improved to 95 degrees after session), abduction 55 degrees (improved to 72 degrees after session), ER 26 degrees, IR 37 degrees (rotation ROM at 80-90 degrees abduction position) (12/22/2020)    Time 10    Period Weeks    Status On-going    Target Date 01/07/21      PT LONG TERM GOAL #4   Title Right shoulder strength >/= 4/5    Baseline R  shoulder strength about 3-/5 secondary to limites AROM. (12/22/2020)    Time 10    Period Weeks    Status On-going    Target Date 01/07/21                   Plan - 12/27/20 1444     Clinical Impression Statement Continued working on AAROM all planes to decrease stiffness as well as scapular, ER, and posterior muscle strengthening improve ability to raise her arm up against gravity with less difficulty. Pt tolerated session very well without aggravation of symptoms. Pt will benefit from continued skilled physical therapy services to improve ROM strength, and function.    Personal Factors and Comorbidities Comorbidity 3+    Comorbidities DM2, cervical herniated disc, scoliosis, obesity, HTN,    Examination-Activity Limitations Lift;Carry;Reach Overhead;Sleep    Examination-Participation Restrictions Occupation;Yard Work    Stability/Clinical Decision Making Stable/Uncomplicated    Rehab Potential Good    PT Frequency 3x / week    PT Duration Other (comment)    PT Treatment/Interventions ADLs/Self Care Home Management;Cryotherapy;Electrical Stimulation;Moist Heat;Functional mobility training;Therapeutic activities;Therapeutic exercise;Balance training;Neuromuscular re-education;Patient/family education;Manual techniques;Scar mobilization;Passive range of motion;Vasopneumatic Device;Joint Manipulations    PT Next Visit Plan HEP, PROM & manual therapy to increase ROM; check measurements    PT Home Exercise Plan Access Code: [email or web address]; Medbridge Access Code [email or web address]    Consulted and Agree with Plan of Care Patient             Patient will benefit from skilled therapeutic intervention in order to improve the following deficits and impairments:  Decreased coordination, Decreased endurance, Decreased range of motion, Decreased strength, Increased edema, Increased muscle spasms, Postural dysfunction, Obesity, Pain  Visit Diagnosis: Muscle weakness (generalized)  Stiffness of right  shoulder, not elsewhere classified  Acute pain of right shoulder  Abnormal posture     Problem List Patient Active Problem List   Diagnosis Date Noted   Upper respiratory infection, acute 12/20/2020   Bruised toe 12/20/2020   Traumatic tear of supraspinatus tendon of right shoulder 09/24/2020   Tendinopathy of right biceps tendon 09/24/2020   Traumatic tear of supraspinatus tendon of left shoulder 09/24/2020   Leg swelling 08/02/2020   Dyslipidemia 01/05/2020   Cervical polyp 08/22/2018   Nocturia 08/22/2018   Intertrigo 08/22/2018   Type 2 diabetes mellitus without complication, without long-term current use of insulin ([place]) 08/22/2018   Cervical spine arthritis with nerve pain 06/25/2018   Herniated disc, cervical 06/25/2018   History of migraine 03/25/2018   Scoliosis 03/25/2018   History of cyst of breast 03/25/2018   Sacrum and coccyx fracture, sequela 02/21/2018   Class 3 severe obesity due to excess calories without serious comorbidity with body mass index (BMI) of 45.0 to 49.9 in adult [facility]) 02/21/2018   Cough 02/21/2018   Onychomycosis 02/21/2018   History of hypertension 02/21/2018   Vitamin D deficiency 03/19/2015   Essential hypertension 03/05/2015   Joneen Boers PT, DPT   12/27/2020, 3:00 PM  [facility] [address], [place], [address] Phone: [phone]   Fax:  [phone]  Name: Tina Stephenson MRN: [id number] Date of Birth: [birth year]

## 2020-12-29 ENCOUNTER — Ambulatory Visit: Payer: Managed Care, Other (non HMO)

## 2020-12-29 DIAGNOSIS — M6281 Muscle weakness (generalized): Secondary | ICD-10-CM

## 2020-12-29 DIAGNOSIS — M25611 Stiffness of right shoulder, not elsewhere classified: Secondary | ICD-10-CM

## 2020-12-29 DIAGNOSIS — M25511 Pain in right shoulder: Secondary | ICD-10-CM

## 2020-12-29 NOTE — Therapy (Signed)
[facility] [address], [place], [address] Phone: [phone]   Fax:  [phone]  Physical Therapy Treatment  Patient Details  Name: Tina Stephenson MRN: [id number] Date of Birth: 1955/03/22 Referring Provider (PT): Dwana Melena, [place]   Encounter Date: 12/29/2020   PT End of Session - 12/29/20 1417     Visit Number 22    Number of Visits 24    Date for PT Re-Evaluation 01/07/21    Authorization Type [employer] Managed    Authorization Time Period 20% CO-INSURANCE, DED MET, 60 DAYS PER CALENDER YEAR AFTER 5TH VISIT, MEDICAL NESSITY NEEDED/REF#1980    Progress Note Due on Visit 44    PT Start Time 1417    PT Stop Time 1457    PT Time Calculation (min) 40 min    Activity Tolerance Patient tolerated treatment well    Behavior During Therapy [facility] for tasks assessed/performed             Past Medical History:  Diagnosis Date   Bronchitis    Depression    Diabetes mellitus without complication ([place])    Gallbladder polyp    GERD (gastroesophageal reflux disease)    Hypertension    Hypertensive disorder 03/05/2015   Liver tumor (benign)    Sacrum and coccyx fracture ([place])    Sciatica     Past Surgical History:  Procedure Laterality Date   APPENDECTOMY     NASAL SEPTUM SURGERY     WISDOM TOOTH EXTRACTION      There were no vitals filed for this visit.   Subjective Assessment - 12/29/20 1419     Subjective R shoulder is about 1-2/10 currently. Was really stiff this morning.    Pertinent History Tina Stephenson is a 65yoF referred by Dwana Melena, PA s/p right rotator cuff repair on 10/14/2020 c Dr. Erlinda Hong at [facility] s/p full-thickness tear of Supraspinatus. Pt also has Left supraspinatus full thickness tear, operative management awaiting full recovery from Rt surgery. She was immobilized post procedure, then on 11/17 allowed to DC the shoulder sling.    Patient Stated Goals To move dominant arm, no pain,  full range, return to work    Currently in Pain? Yes    Pain Score 2                                         PT Education - 12/29/20 1435     Education Details ther-ex    Person(s) Educated Patient    Methods Explanation;Demonstration;Tactile cues;Verbal cues    Comprehension Returned demonstration;Verbalized understanding              Objective   Medbridge Access Code [email or web address]   Pt protocol under Media section, under Physician Order on 11/10/2020   January 05, 2021 next MD appointment     Therapeutic exercises      Supine AAROM with PT R shoulder, 0 to 5 second holds each position.              Flexion 10x2             scaption 10x2             Abduction 10x2             ER in scapular plan 10x2  IR in scapular plane 10x2     Supine shoulder AROM with short lever arm to start             Flexion 10x2, cues for scapular retraction              Scaption 10x AROM with AAROM to guide motion   Seated AAROM with PT, emphasis on proper scapular mechanics             Flexion 5x2             Scaption 5x2    Seated manually resisted scapular retraction isometrics targeting lower trap muscle             R 10x5 seconds for 3 sets   Standing R shoulder ER isometrics in neutral, elbow bent to 90 degrees flexion 10x5 seconds for 2 sets   Standing R elbow extension isometrics, elbow bend to 90 degrees 10x2 with 5 second holds        Improved exercise technique, movement at target joints, use of target muscles after mod verbal, visual, tactile cues.        Response to treatment Pt tolerated session well without aggravation of symptoms.      Clinical impression. Continued working on improving R shoulder A/AROM to decrease stiffness as well as improving scapular and ER and posterior shoulder strength to promote ability to raise her arm up with less difficulty. Stiff end feel. Pt tolerated session well without aggravation of  symptoms. Pt will benefit from continued skilled physical therapy services to improve ROM strength, and function.     PT Short Term Goals - 12/22/20 1426       PT SHORT TERM GOAL #1   Title FOTO improved to 35%    Baseline Unable to access FOTO information and retrieve questionnaire for pt to fill out. (11/22/2020)    Time 4    Period Weeks    Status On-going    Target Date 11/23/20      PT SHORT TERM GOAL #2   Title right shoulder PROM flexion & abduction 100*    Baseline Supine AAROM R shoulder 115 degrees flexion, 91 degrees abduction (11/22/2020); supine AAROM R shoulder flexion 120 degrees, abduction 97 degrees (12/22/2020)    Time 4    Period Weeks    Status Partially Met    Target Date 11/23/20      PT SHORT TERM GOAL #3   Title Patient is independent with initial HEP; No questions after reviewed and redemonstrated today (11/22/2020)    Time 4    Period Weeks    Status Achieved    Target Date 11/23/20               PT Long Term Goals - 12/22/20 1414       PT LONG TERM GOAL #1   Title Patient reports pain in right shoulder </= 2/10 with activities    Baseline 3/10 at most for the past 7 days (12/22/2020)    Time 10    Period Weeks    Status On-going    Target Date 01/07/21      PT LONG TERM GOAL #2   Title FOTO >/= 55%    Time 10    Period Weeks    Status Unable to assess    Target Date 01/07/21      PT LONG TERM GOAL #3   Title Right shoulder AROM Flexion & abduction 120* and external & internal rotation 60*  Baseline R shoulder AROM flexion 73 degrees (improved to 95 degrees after session), abduction 55 degrees (improved to 72 degrees after session), ER 26 degrees, IR 37 degrees (rotation ROM at 80-90 degrees abduction position) (12/22/2020)    Time 10    Period Weeks    Status On-going    Target Date 01/07/21      PT LONG TERM GOAL #4   Title Right shoulder strength >/= 4/5    Baseline R shoulder strength about 3-/5 secondary to limites AROM.  (12/22/2020)    Time 10    Period Weeks    Status On-going    Target Date 01/07/21                   Plan - 12/29/20 1436     Clinical Impression Statement Continued working on improving R shoulder A/AROM to decrease stiffness as well as improving scapular and ER and posterior shoulder strength to promote ability to raise her arm up with less difficulty. Stiff end feel. Pt tolerated session well without aggravation of symptoms. Pt will benefit from continued skilled physical therapy services to improve ROM strength, and function.    Personal Factors and Comorbidities Comorbidity 3+    Comorbidities DM2, cervical herniated disc, scoliosis, obesity, HTN,    Examination-Activity Limitations Lift;Carry;Reach Overhead;Sleep    Examination-Participation Restrictions Occupation;Yard Work    Stability/Clinical Decision Making Stable/Uncomplicated    Clinical Decision Making Low    Rehab Potential Good    PT Frequency 3x / week    PT Duration Other (comment)    PT Treatment/Interventions ADLs/Self Care Home Management;Cryotherapy;Electrical Stimulation;Moist Heat;Functional mobility training;Therapeutic activities;Therapeutic exercise;Balance training;Neuromuscular re-education;Patient/family education;Manual techniques;Scar mobilization;Passive range of motion;Vasopneumatic Device;Joint Manipulations    PT Next Visit Plan HEP, PROM & manual therapy to increase ROM; check measurements    PT Home Exercise Plan Access Code: [email or web address]; Medbridge Access Code [email or web address]    Consulted and Agree with Plan of Care Patient             Patient will benefit from skilled therapeutic intervention in order to improve the following deficits and impairments:  Decreased coordination, Decreased endurance, Decreased range of motion, Decreased strength, Increased edema, Increased muscle spasms, Postural dysfunction, Obesity, Pain  Visit Diagnosis: Muscle weakness (generalized)  Stiffness of right  shoulder, not elsewhere classified  Acute pain of right shoulder     Problem List Patient Active Problem List   Diagnosis Date Noted   Upper respiratory infection, acute 12/20/2020   Bruised toe 12/20/2020   Traumatic tear of supraspinatus tendon of right shoulder 09/24/2020   Tendinopathy of right biceps tendon 09/24/2020   Traumatic tear of supraspinatus tendon of left shoulder 09/24/2020   Leg swelling 08/02/2020   Dyslipidemia 01/05/2020   Cervical polyp 08/22/2018   Nocturia 08/22/2018   Intertrigo 08/22/2018   Type 2 diabetes mellitus without complication, without long-term current use of insulin ([place]) 08/22/2018   Cervical spine arthritis with nerve pain 06/25/2018   Herniated disc, cervical 06/25/2018   History of migraine 03/25/2018   Scoliosis 03/25/2018   History of cyst of breast 03/25/2018   Sacrum and coccyx fracture, sequela 02/21/2018   Class 3 severe obesity due to excess calories without serious comorbidity with body mass index (BMI) of 45.0 to 49.9 in adult [facility]) 02/21/2018   Cough 02/21/2018   Onychomycosis 02/21/2018   History of hypertension 02/21/2018   Vitamin D deficiency 03/19/2015   Essential hypertension 03/05/2015    Joneen Boers PT, DPT   12/29/2020,  6:16 PM  [facility] [address], [place], [address] Phone: [phone]   Fax:  [phone]  Name: Tina Stephenson MRN: [id number] Date of Birth: [birth year]

## 2021-01-04 ENCOUNTER — Ambulatory Visit: Payer: Managed Care, Other (non HMO)

## 2021-01-04 DIAGNOSIS — M25611 Stiffness of right shoulder, not elsewhere classified: Secondary | ICD-10-CM

## 2021-01-04 DIAGNOSIS — M6281 Muscle weakness (generalized): Secondary | ICD-10-CM

## 2021-01-04 DIAGNOSIS — M25511 Pain in right shoulder: Secondary | ICD-10-CM

## 2021-01-04 NOTE — Therapy (Signed)
[facility] [address] [place], [address] Phone: [phone]   Fax:  [phone]  Physical Therapy Treatment  Patient Details  Name: Tina Stephenson MRN: [id number] Date of Birth: 1955-11-01 Referring Provider (PT): Dwana Melena, [place]   Encounter Date: 01/04/2021   PT End of Session - 01/04/21 1018     Visit Number 23    Number of Visits 24    Date for PT Re-Evaluation 01/07/21    Authorization Type [employer] Managed    Authorization Time Period 20% CO-INSURANCE, DED MET, 60 DAYS PER CALENDER YEAR AFTER 5TH VISIT, MEDICAL NESSITY NEEDED/REF#1980    Progress Note Due on Visit 51    PT Start Time 1018    PT Stop Time 1057    PT Time Calculation (min) 39 min    Activity Tolerance Patient tolerated treatment well    Behavior During Therapy WFL for tasks assessed/performed             Past Medical History:  Diagnosis Date   Bronchitis    Depression    Diabetes mellitus without complication ([place])    Gallbladder polyp    GERD (gastroesophageal reflux disease)    Hypertension    Hypertensive disorder 03/05/2015   Liver tumor (benign)    Sacrum and coccyx fracture ([place])    Sciatica     Past Surgical History:  Procedure Laterality Date   APPENDECTOMY     NASAL SEPTUM SURGERY     WISDOM TOOTH EXTRACTION      There were no vitals filed for this visit.   Subjective Assessment - 01/04/21 1020     Subjective R shoulder is a 1/10 currently. Some days it was a 3/10, other days it was a 2/10.    Pertinent History Tina Stephenson is a 25yoF referred by Dwana Melena, PA s/p right rotator cuff repair on 10/14/2020 c Dr. Erlinda Hong at [facility] s/p full-thickness tear of Supraspinatus. Pt also has Left supraspinatus full thickness tear, operative management awaiting full recovery from Rt surgery. She was immobilized post procedure, then on 11/17 allowed to DC the shoulder sling.    Patient Stated Goals To move dominant  arm, no pain, full range, return to work    Currently in Pain? Yes    Pain Score 1                                         PT Education - 01/04/21 1045     Education Details ther-ex    Person(s) Educated Patient    Methods Explanation;Demonstration;Tactile cues;Verbal cues    Comprehension Returned demonstration;Verbalized understanding            Objective   Medbridge Access Code [email or web address]   Pt protocol under Media section, under Physician Order on 11/10/2020      Therapeutic exercises    Supine AAROM with PT R shoulder, 0 to 5 second holds each position.              Flexion 10x2             scaption 10x2             Abduction 10x2             ER in scapular plan 10x2              IR in scapular plane  10x2 (Supine STM R teres major with shoulder in comfortable flexion position to promote better shoulder AAROM after first set of 10 of aforementioned exercises)    Standing R shoulder ER yellow band 5x3 with PT tactile cues for scapular retraction, posterior tipping and R shoulder in slight flexion   Standing B scapular retraction yellow band 10x2 with 5 seconds    Seated AAROM with PT, emphasis on proper scapular mechanics             Flexion 5x2 with 2 second holds              Scaption 5x2 with 2 second holds   Standing R shoulder AROM   Flexion 85 degrees   Abduction 75 degrees      Improved exercise technique, movement at target joints, use of target muscles after mod verbal, visual, tactile cues.        Response to treatment Pt tolerated session well without aggravation of symptoms.      Clinical impression. Pt demonstrates overall improving R shoulder A/AROM with pt able to raise her R arm up 85 degrees flexion and 75 degrees abduction against gravity today actively. Able to achieve more ROM with PT assist. Continued working on improving ROM as well as strength to promote ability to raise her arm up against gravity. Stiff  end feel. Pt tolerated session well without aggravation of symptoms. Pt will benefit from continued skilled physical therapy services to improve ROM strength, and function.      PT Short Term Goals - 12/22/20 1426       PT SHORT TERM GOAL #1   Title FOTO improved to 35%    Baseline Unable to access FOTO information and retrieve questionnaire for pt to fill out. (11/22/2020)    Time 4    Period Weeks    Status On-going    Target Date 11/23/20      PT SHORT TERM GOAL #2   Title right shoulder PROM flexion & abduction 100*    Baseline Supine AAROM R shoulder 115 degrees flexion, 91 degrees abduction (11/22/2020); supine AAROM R shoulder flexion 120 degrees, abduction 97 degrees (12/22/2020)    Time 4    Period Weeks    Status Partially Met    Target Date 11/23/20      PT SHORT TERM GOAL #3   Title Patient is independent with initial HEP; No questions after reviewed and redemonstrated today (11/22/2020)    Time 4    Period Weeks    Status Achieved    Target Date 11/23/20               PT Long Term Goals - 12/22/20 1414       PT LONG TERM GOAL #1   Title Patient reports pain in right shoulder </= 2/10 with activities    Baseline 3/10 at most for the past 7 days (12/22/2020)    Time 10    Period Weeks    Status On-going    Target Date 01/07/21      PT LONG TERM GOAL #2   Title FOTO >/= 55%    Time 10    Period Weeks    Status Unable to assess    Target Date 01/07/21      PT LONG TERM GOAL #3   Title Right shoulder AROM Flexion & abduction 120* and external & internal rotation 60*    Baseline R shoulder AROM flexion 73 degrees (improved to 95 degrees after session),  abduction 55 degrees (improved to 72 degrees after session), ER 26 degrees, IR 37 degrees (rotation ROM at 80-90 degrees abduction position) (12/22/2020)    Time 10    Period Weeks    Status On-going    Target Date 01/07/21      PT LONG TERM GOAL #4   Title Right shoulder strength >/= 4/5     Baseline R shoulder strength about 3-/5 secondary to limites AROM. (12/22/2020)    Time 10    Period Weeks    Status On-going    Target Date 01/07/21                   Plan - 01/04/21 1045     Clinical Impression Statement Pt demonstrates overall improving R shoulder A/AROM with pt able to raise her R arm up 85 degrees flexion and 75 degrees abduction against gravity today actively. Able to achieve more ROM with PT assist. Continued working on improving ROM as well as strength to promote ability to raise her arm up against gravity. Stiff end feel. Pt tolerated session well without aggravation of symptoms. Pt will benefit from continued skilled physical therapy services to improve ROM strength, and function.    Personal Factors and Comorbidities Comorbidity 3+    Comorbidities DM2, cervical herniated disc, scoliosis, obesity, HTN,    Examination-Activity Limitations Lift;Carry;Reach Overhead;Sleep    Examination-Participation Restrictions Occupation;Yard Work    Stability/Clinical Decision Making Stable/Uncomplicated    Clinical Decision Making Low    Rehab Potential Good    PT Frequency 3x / week    PT Duration Other (comment)    PT Treatment/Interventions ADLs/Self Care Home Management;Cryotherapy;Electrical Stimulation;Moist Heat;Functional mobility training;Therapeutic activities;Therapeutic exercise;Balance training;Neuromuscular re-education;Patient/family education;Manual techniques;Scar mobilization;Passive range of motion;Vasopneumatic Device;Joint Manipulations    PT Next Visit Plan HEP, PROM & manual therapy to increase ROM; check measurements    PT Home Exercise Plan Access Code: [email or web address]; Medbridge Access Code [email or web address]    Consulted and Agree with Plan of Care Patient             Patient will benefit from skilled therapeutic intervention in order to improve the following deficits and impairments:  Decreased coordination, Decreased endurance, Decreased range of  motion, Decreased strength, Increased edema, Increased muscle spasms, Postural dysfunction, Obesity, Pain  Visit Diagnosis: Muscle weakness (generalized)  Stiffness of right shoulder, not elsewhere classified  Acute pain of right shoulder     Problem List Patient Active Problem List   Diagnosis Date Noted   Upper respiratory infection, acute 12/20/2020   Bruised toe 12/20/2020   Traumatic tear of supraspinatus tendon of right shoulder 09/24/2020   Tendinopathy of right biceps tendon 09/24/2020   Traumatic tear of supraspinatus tendon of left shoulder 09/24/2020   Leg swelling 08/02/2020   Dyslipidemia 01/05/2020   Cervical polyp 08/22/2018   Nocturia 08/22/2018   Intertrigo 08/22/2018   Type 2 diabetes mellitus without complication, without long-term current use of insulin ([place]) 08/22/2018   Cervical spine arthritis with nerve pain 06/25/2018   Herniated disc, cervical 06/25/2018   History of migraine 03/25/2018   Scoliosis 03/25/2018   History of cyst of breast 03/25/2018   Sacrum and coccyx fracture, sequela 02/21/2018   Class 3 severe obesity due to excess calories without serious comorbidity with body mass index (BMI) of 45.0 to 49.9 in adult [facility]) 02/21/2018   Cough 02/21/2018   Onychomycosis 02/21/2018   History of hypertension 02/21/2018   Vitamin D deficiency 03/19/2015   Essential hypertension 03/05/2015  Joneen Boers PT, DPT   01/04/2021, 2:28 PM  [facility] [facility] [address], [place], [address] Phone: [phone]   Fax:  [phone]  Name: Yajayra Feldt MRN: [id number] Date of Birth: Aug 09, 1955

## 2021-01-05 ENCOUNTER — Telehealth: Payer: Self-pay | Admitting: Physical Medicine and Rehabilitation

## 2021-01-05 ENCOUNTER — Encounter: Payer: Self-pay | Admitting: Orthopaedic Surgery

## 2021-01-05 ENCOUNTER — Other Ambulatory Visit: Payer: Self-pay

## 2021-01-05 ENCOUNTER — Ambulatory Visit (INDEPENDENT_AMBULATORY_CARE_PROVIDER_SITE_OTHER): Payer: Managed Care, Other (non HMO) | Admitting: Orthopaedic Surgery

## 2021-01-05 DIAGNOSIS — Z9889 Other specified postprocedural states: Secondary | ICD-10-CM

## 2021-01-05 NOTE — Progress Notes (Signed)
Post-Op Visit Note   Patient: Tina Stephenson           Date of Birth: 1955/09/22           MRN: [id number] Visit Date: 01/05/2021 PCP: Delsa Grana, PA-C   Assessment & Plan:  Chief Complaint:  Chief Complaint  Patient presents with   Right Shoulder - Routine Post Op, Follow-up   Visit Diagnoses:  1. S/P arthroscopy of right shoulder   2. S/P right rotator cuff repair     Plan: Aletta is status post rotator cuff repair on 10/14/2020.  She is doing physical therapy twice a week.  Her range of motion is getting better but still limited more than she would like.  She feels some popping in the scapular region with forward flexion.  She is scheduled to go back to work on Friday.  She does desk duty.  Right shoulder shows fully healed surgical scars.  Passive abduction to 50 degrees.  Passive forward flexion 90 degrees.  Passive external rotation to 5 degrees.  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  I recommend intra-articular cortisone injection and continued outpatient PT to work aggressively on range of motion.  Recheck in 6 weeks.  Follow-Up Instructions: Return in about 6 weeks (around 02/16/2021).   Orders:  Orders Placed This Encounter  Procedures   Ambulatory referral to Physical Medicine Rehab   No orders of the defined types were placed in this encounter.   Imaging: No results found.  PMFS History: Patient Active Problem List   Diagnosis Date Noted   Upper respiratory infection, acute 12/20/2020   Bruised toe 12/20/2020   Traumatic tear of supraspinatus tendon of right shoulder 09/24/2020   Tendinopathy of right biceps tendon 09/24/2020   Traumatic tear of supraspinatus tendon of left shoulder 09/24/2020   Leg swelling 08/02/2020   Dyslipidemia 01/05/2020   Cervical polyp 08/22/2018   Nocturia 08/22/2018   Intertrigo 08/22/2018   Type 2 diabetes mellitus without complication, without long-term current use of insulin ([place]) 08/22/2018   Cervical  spine arthritis with nerve pain 06/25/2018   Herniated disc, cervical 06/25/2018   History of migraine 03/25/2018   Scoliosis 03/25/2018   History of cyst of breast 03/25/2018   Sacrum and coccyx fracture, sequela 02/21/2018   Class 3 severe obesity due to excess calories without serious comorbidity with body mass index (BMI) of 45.0 to 49.9 in adult ([place]) 02/21/2018   Cough 02/21/2018   Onychomycosis 02/21/2018   History of hypertension 02/21/2018   Vitamin D deficiency 03/19/2015   Essential hypertension 03/05/2015   Past Medical History:  Diagnosis Date   Bronchitis    Depression    Diabetes mellitus without complication ([place])    Gallbladder polyp    GERD (gastroesophageal reflux disease)    Hypertension    Hypertensive disorder 03/05/2015   Liver tumor (benign)    Sacrum and coccyx fracture ([place])    Sciatica     Family History  Problem Relation Age of Onset   COPD Mother    Cancer Mother    Heart disease Father    Breast cancer Paternal Aunt    Diabetes Brother    Diabetes Maternal Grandfather    Ovarian cancer Neg Hx    Colon cancer Neg Hx     Past Surgical History:  Procedure Laterality Date   APPENDECTOMY     NASAL SEPTUM SURGERY     WISDOM TOOTH EXTRACTION     Social History  Occupational History   Not on file  Tobacco Use   Smoking status: Never   Smokeless tobacco: Never  Vaping Use   Vaping Use: Never used  Substance and Sexual Activity   Alcohol use: Never   Drug use: Never   Sexual activity: Not Currently    Birth control/protection: None

## 2021-01-05 NOTE — Telephone Encounter (Signed)
Patient needs to schedule injections. Please advise.

## 2021-01-06 ENCOUNTER — Encounter: Payer: Managed Care, Other (non HMO) | Admitting: Physician Assistant

## 2021-01-06 ENCOUNTER — Encounter: Payer: Self-pay | Admitting: Orthopaedic Surgery

## 2021-01-06 ENCOUNTER — Ambulatory Visit: Payer: Managed Care, Other (non HMO)

## 2021-01-06 ENCOUNTER — Telehealth: Payer: Self-pay | Admitting: Orthopaedic Surgery

## 2021-01-06 DIAGNOSIS — R293 Abnormal posture: Secondary | ICD-10-CM

## 2021-01-06 DIAGNOSIS — M25611 Stiffness of right shoulder, not elsewhere classified: Secondary | ICD-10-CM

## 2021-01-06 DIAGNOSIS — R600 Localized edema: Secondary | ICD-10-CM

## 2021-01-06 DIAGNOSIS — M6281 Muscle weakness (generalized): Secondary | ICD-10-CM | POA: Diagnosis not present

## 2021-01-06 DIAGNOSIS — M25511 Pain in right shoulder: Secondary | ICD-10-CM

## 2021-01-06 LAB — COMPREHENSIVE METABOLIC PANEL
ALT: 24 IU/L (ref 0–32)
AST: 20 IU/L (ref 0–40)
Albumin/Globulin Ratio: 2.3 — ABNORMAL HIGH (ref 1.2–2.2)
Albumin: 4.6 g/dL (ref 3.8–4.8)
Alkaline Phosphatase: 109 IU/L (ref 44–121)
BUN/Creatinine Ratio: 25 (ref 12–28)
BUN: 18 mg/dL (ref 8–27)
Bilirubin Total: 0.9 mg/dL (ref 0.0–1.2)
CO2: 26 mmol/L (ref 20–29)
Calcium: 10.1 mg/dL (ref 8.7–10.3)
Chloride: 99 mmol/L (ref 96–106)
Creatinine, Ser: 0.73 mg/dL (ref 0.57–1.00)
Globulin, Total: 2 g/dL (ref 1.5–4.5)
Glucose: 93 mg/dL (ref 70–99)
Potassium: 4.3 mmol/L (ref 3.5–5.2)
Sodium: 140 mmol/L (ref 134–144)
Total Protein: 6.6 g/dL (ref 6.0–8.5)
eGFR: 92 mL/min/{1.73_m2} (ref >59)

## 2021-01-06 NOTE — Therapy (Signed)
[facility] [address], [place], [address] Phone: [phone]   Fax:  [phone]  Physical Therapy Treatment  Patient Details  Name: Tina Stephenson MRN: [id number] Date of Birth: Dec 06, 1955 Referring Provider (PT): Dwana Melena, [place]   Encounter Date: 01/06/2021   PT End of Session - 01/06/21 1150     Visit Number 24    Number of Visits 40    Date for PT Re-Evaluation 03/03/21    Authorization Type [employer] Managed    Authorization Time Period 20% CO-INSURANCE, DED MET, 60 DAYS PER CALENDER YEAR AFTER 5TH VISIT, MEDICAL NESSITY NEEDED/REF#1980    Progress Note Due on Visit 60    PT Start Time 1150    PT Stop Time 1231    PT Time Calculation (min) 41 min    Activity Tolerance Patient tolerated treatment well    Behavior During Therapy [facility] for tasks assessed/performed             Past Medical History:  Diagnosis Date   Bronchitis    Depression    Diabetes mellitus without complication ([place])    Gallbladder polyp    GERD (gastroesophageal reflux disease)    Hypertension    Hypertensive disorder 03/05/2015   Liver tumor (benign)    Sacrum and coccyx fracture ([place])    Sciatica     Past Surgical History:  Procedure Laterality Date   APPENDECTOMY     NASAL SEPTUM SURGERY     WISDOM TOOTH EXTRACTION      There were no vitals filed for this visit.   Subjective Assessment - 01/06/21 1152     Subjective Surgeon states that her shoulder is partially frozen and wants to use a cortisone shot. The popping is normal from having her arm immobilized then moving. No pain at rest, 1-2/10 when raising her arm up.    Pertinent History Tina Stephenson is a 89yoF referred by Dwana Melena, PA s/p right rotator cuff repair on 10/14/2020 c Dr. Erlinda Hong at [facility] s/p full-thickness tear of Supraspinatus. Pt also has Left supraspinatus full thickness tear, operative management awaiting full recovery from Rt surgery. She was  immobilized post procedure, then on 11/17 allowed to DC the shoulder sling.    Patient Stated Goals To move dominant arm, no pain, full range, return to work    Currently in Pain? Yes    Pain Score 2                                         PT Education - 01/06/21 1301     Education Details ther-ex    Person(s) Educated Patient    Methods Explanation;Demonstration;Tactile cues;Verbal cues    Comprehension Returned demonstration;Verbalized understanding           Objective   Medbridge Access Code [email or web address]   Pt protocol under Media section, under Physician Order on 11/10/2020      Manual therapy  Supine posterior and inferior joint glide gradie 3- to 3 with arm in comfortabel abduction   Supine STM R teres major muscle to decrease tension   Therapeutic exercises    Supine AAROM with PT R shoulder, 0 to 5 second holds each position.              Abduction 10x3   Supine AAROM  Flexion 131 degrees Abduction 100 degrees  At 90 degrees abduction    ER 42 degrees   IR 42 degrees  Standing R shoulder AROM   Flexion 74 degrees,   Abduction 53 degrees   Seated R shoulder AAROM with PT assist   Flexion 5x5 seconds   Abduction 5x5 seconds   Standing R shoulder ER yellow band 5x, then 3x.    Standing R shoulder AROM after sessoin   Flexion 83 degrees  Abduction 61 degrees        Improved exercise technique, movement at target joints, use of target muscles after min to mod verbal, visual, tactile cues.        Response to treatment Pt tolerated session well without aggravation of symptoms.      Clinical impression. Pt demonstrates overall improving R shoulder strength and AAROM in supine. Demonstrates some difficulty in the upright position with increased gravitational resistance. Stiff end feel all planes. Pt still demonstrates limited R shoulder ROM, stiffness, and decreased strength and will benefit from continued skilled  physical therapy services to address the aforementioned deficits.            PT Short Term Goals - 01/06/21 1219       PT SHORT TERM GOAL #1   Title FOTO improved to 35%    Baseline Unable to access FOTO information and retrieve questionnaire for pt to fill out. (11/22/2020)    Time 4    Period Weeks    Status On-going    Target Date 11/23/20      PT SHORT TERM GOAL #2   Title right shoulder PROM flexion & abduction 100*    Baseline Supine AAROM R shoulder 115 degrees flexion, 91 degrees abduction (11/22/2020); supine AAROM R shoulder flexion 120 degrees, abduction 97 degrees (12/22/2020); 131 degrees flexion AAROM, 100 degrees abduction (01/06/2021)    Time 4    Period Weeks    Status Achieved    Target Date 11/23/20      PT SHORT TERM GOAL #3   Title Patient is independent with initial HEP; No questions after reviewed and redemonstrated today (11/22/2020)    Time 4    Period Weeks    Status Achieved    Target Date 11/23/20               PT Long Term Goals - 01/06/21 1221       PT LONG TERM GOAL #1   Title Patient reports pain in right shoulder </= 2/10 with activities    Baseline 3/10 at most for the past 7 days (12/22/2020); 1-2/10 at worst, occasional 3/10 (01/06/2021)    Time 8    Period Weeks    Status Partially Met    Target Date 03/03/21      PT LONG TERM GOAL #2   Title FOTO >/= 55%    Time 10    Period Weeks    Status Unable to assess    Target Date 01/07/21      PT LONG TERM GOAL #3   Title Right shoulder AROM Flexion & abduction 120* and external & internal rotation 60*    Baseline R shoulder AROM flexion 73 degrees (improved to 95 degrees after session), abduction 55 degrees (improved to 72 degrees after session), ER 26 degrees, IR 37 degrees (rotation ROM at 80-90 degrees abduction position) (12/22/2020); 83 degrees flexion, 61 degrees abduction, 42 degrees ER and IR at 90 degrees abduction (01/06/2021)    Time 8    Period Weeks  Status On-going    Target Date 03/03/21      PT LONG TERM GOAL #4   Title Right shoulder strength >/= 4/5    Baseline R shoulder strength about 3-/5 secondary to limites AROM. (12/22/2020); At available range: ER 4/5, IR 4/5, flexion and abduction 3-/5 (01/06/2021)    Time 8    Period Weeks    Status Partially Met    Target Date 03/03/21                   Plan - 01/06/21 1302     Clinical Impression Statement Pt demonstrates overall improving R shoulder strength and AAROM in supine. Demonstrates some difficulty in the upright position with increased gravitational resistance. Stiff end feel all planes. Pt still demonstrates limited R shoulder ROM, stiffness, and decreased strength and will benefit from continued skilled physical therapy services to address the aforementioned deficits.    Personal Factors and Comorbidities Comorbidity 3+    Comorbidities DM2, cervical herniated disc, scoliosis, obesity, HTN,    Examination-Activity Limitations Lift;Carry;Reach Overhead;Sleep    Examination-Participation Restrictions Occupation;Yard Work    Stability/Clinical Decision Making Stable/Uncomplicated    Clinical Decision Making Low    Rehab Potential Good    PT Frequency 2x / week    PT Duration 8 weeks    PT Treatment/Interventions ADLs/Self Care Home Management;Cryotherapy;Electrical Stimulation;Moist Heat;Functional mobility training;Therapeutic activities;Therapeutic exercise;Balance training;Neuromuscular re-education;Patient/family education;Manual techniques;Scar mobilization;Passive range of motion;Vasopneumatic Device;Joint Manipulations    PT Next Visit Plan HEP, PROM & manual therapy to increase ROM; check measurements    PT Home Exercise Plan Access Code: [email or web address]; Medbridge Access Code [email or web address]    Consulted and Agree with Plan of Care Patient             Patient will benefit from skilled therapeutic intervention in order to improve the following deficits and  impairments:  Decreased coordination, Decreased endurance, Decreased range of motion, Decreased strength, Increased edema, Increased muscle spasms, Postural dysfunction, Obesity, Pain  Visit Diagnosis: Muscle weakness (generalized) - Plan: PT plan of care cert/re-cert  Acute pain of right shoulder - Plan: PT plan of care cert/re-cert  Stiffness of right shoulder, not elsewhere classified - Plan: PT plan of care cert/re-cert  Abnormal posture - Plan: PT plan of care cert/re-cert  Localized edema - Plan: PT plan of care cert/re-cert     Problem List Patient Active Problem List   Diagnosis Date Noted   Upper respiratory infection, acute 12/20/2020   Bruised toe 12/20/2020   Traumatic tear of supraspinatus tendon of right shoulder 09/24/2020   Tendinopathy of right biceps tendon 09/24/2020   Traumatic tear of supraspinatus tendon of left shoulder 09/24/2020   Leg swelling 08/02/2020   Dyslipidemia 01/05/2020   Cervical polyp 08/22/2018   Nocturia 08/22/2018   Intertrigo 08/22/2018   Type 2 diabetes mellitus without complication, without long-term current use of insulin ([place]) 08/22/2018   Cervical spine arthritis with nerve pain 06/25/2018   Herniated disc, cervical 06/25/2018   History of migraine 03/25/2018   Scoliosis 03/25/2018   History of cyst of breast 03/25/2018   Sacrum and coccyx fracture, sequela 02/21/2018   Class 3 severe obesity due to excess calories without serious comorbidity with body mass index (BMI) of 45.0 to 49.9 in adult [facility]) 02/21/2018   Cough 02/21/2018   Onychomycosis 02/21/2018   History of hypertension 02/21/2018   Vitamin D deficiency 03/19/2015   Essential hypertension 03/05/2015   Tina Stephenson PT, DPT   01/06/2021, 1:10 PM  [place] [facility] [address], [place], [address] Phone: [phone]   Fax:  [phone]  Name: Tina Stephenson MRN: [id number] Date of Birth:  June 09, 1955

## 2021-01-06 NOTE — Telephone Encounter (Signed)
[employer] forms received (for intermittent for P.T. appts). To [employer]

## 2021-01-06 NOTE — Telephone Encounter (Signed)
[employer] forms received from pt. To [employer].

## 2021-01-11 ENCOUNTER — Encounter: Payer: Self-pay | Admitting: Physical Medicine and Rehabilitation

## 2021-01-12 ENCOUNTER — Other Ambulatory Visit: Payer: Self-pay

## 2021-01-12 ENCOUNTER — Ambulatory Visit
Admission: RE | Admit: 2021-01-12 | Discharge: 2021-01-12 | Disposition: A | Discharge status: Home / Self Care | Payer: Managed Care, Other (non HMO) | Source: Ambulatory Visit | Attending: Family Medicine | Admitting: Family Medicine

## 2021-01-12 ENCOUNTER — Ambulatory Visit: Payer: Managed Care, Other (non HMO) | Attending: Physician Assistant

## 2021-01-12 DIAGNOSIS — M6281 Muscle weakness (generalized): Secondary | ICD-10-CM | POA: Insufficient documentation

## 2021-01-12 DIAGNOSIS — Z1231 Encounter for screening mammogram for malignant neoplasm of breast: Secondary | ICD-10-CM | POA: Insufficient documentation

## 2021-01-12 DIAGNOSIS — M25611 Stiffness of right shoulder, not elsewhere classified: Secondary | ICD-10-CM | POA: Insufficient documentation

## 2021-01-12 DIAGNOSIS — R293 Abnormal posture: Secondary | ICD-10-CM | POA: Diagnosis present

## 2021-01-12 DIAGNOSIS — M25511 Pain in right shoulder: Secondary | ICD-10-CM | POA: Insufficient documentation

## 2021-01-12 IMAGING — MG MM DIGITAL SCREENING BILAT W/ TOMO AND CAD
8 series · 8 of 24 positions shown · non-contrast
Comparison: Previous exam(s).

CLINICAL DATA: Screening.

EXAM:
DIGITAL SCREENING BILATERAL MAMMOGRAM WITH TOMOSYNTHESIS AND CAD
TECHNIQUE: Bilateral screening digital craniocaudal and mediolateral oblique
mammograms were obtained. Bilateral screening digital breast
tomosynthesis was performed. The images were evaluated with
computer-aided detection.

[L CC synth-2D]
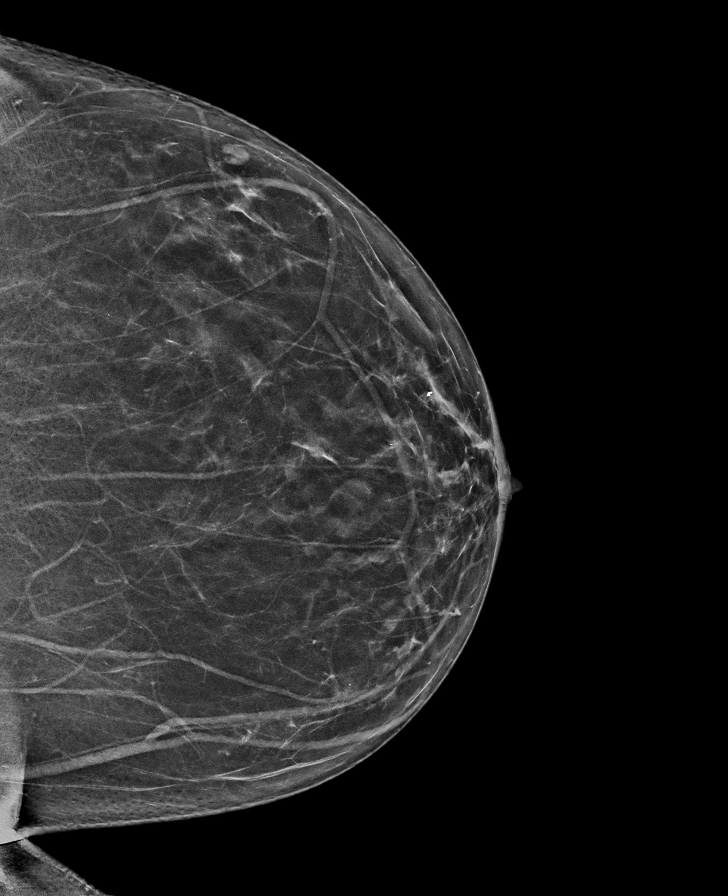

[R CC synth-2D]
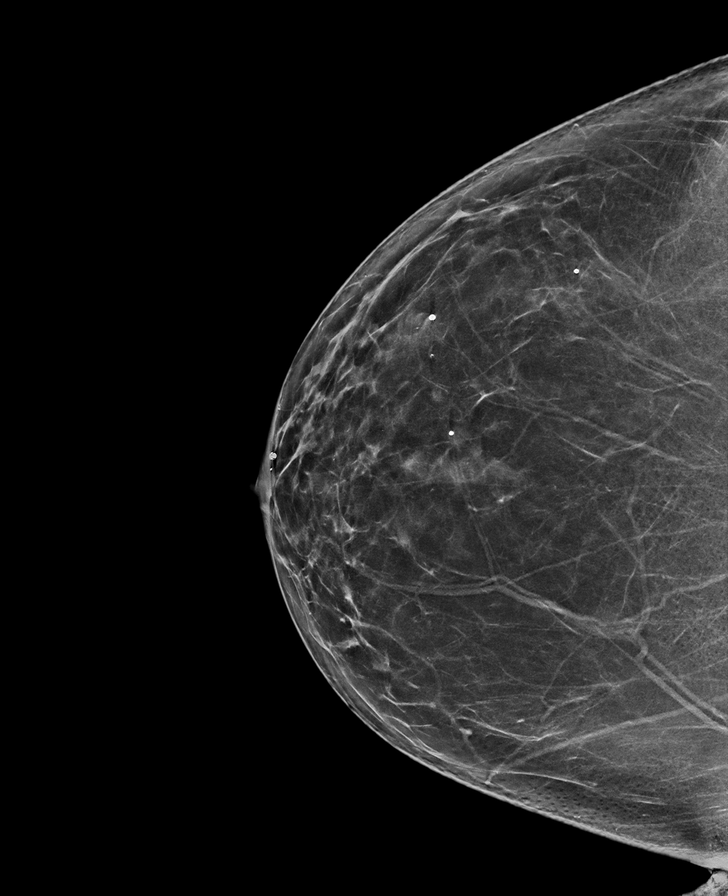

[R MLO synth-2D]
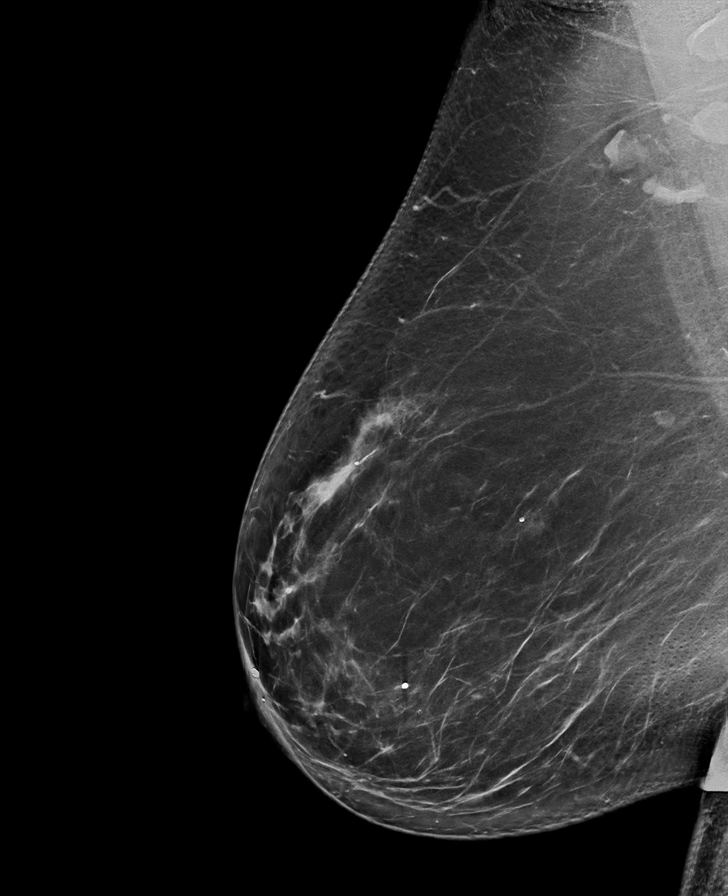

[L MLO synth-2D]
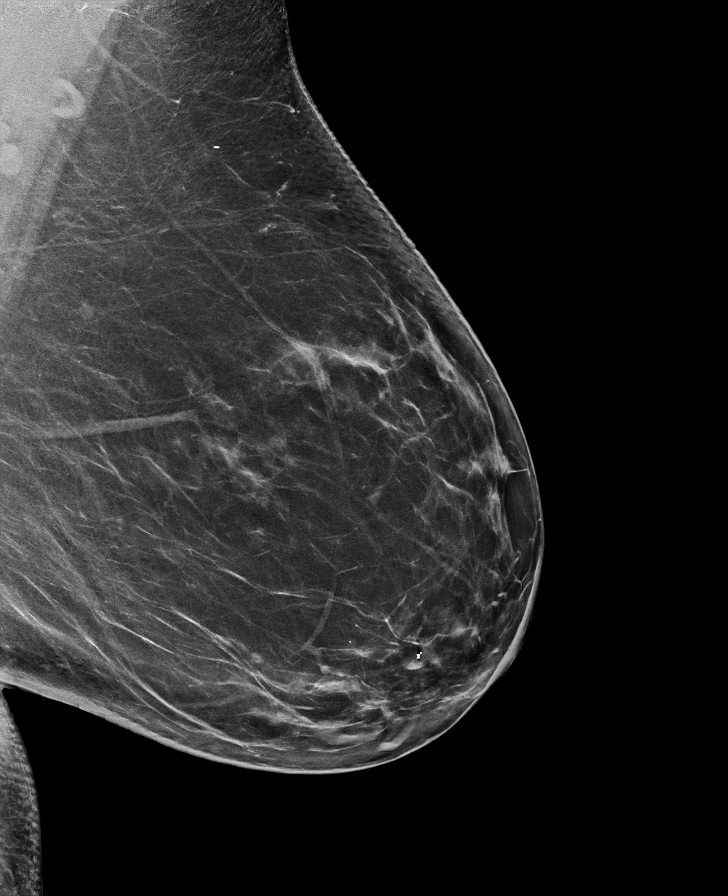

[L MLO tomo · tomo slice 41/82.0]
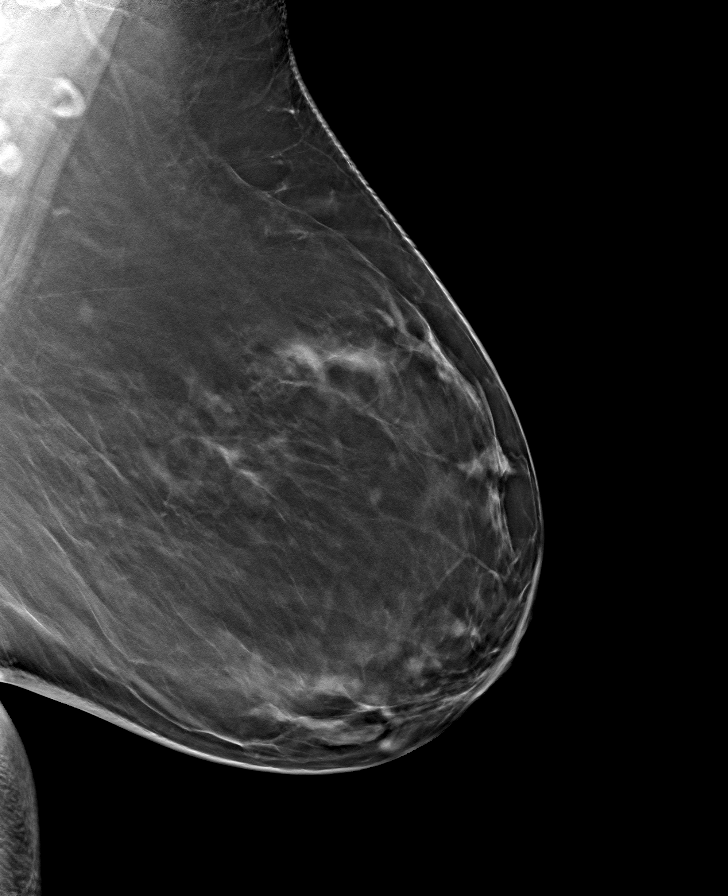

[R CC tomo · tomo slice 38/75.0]
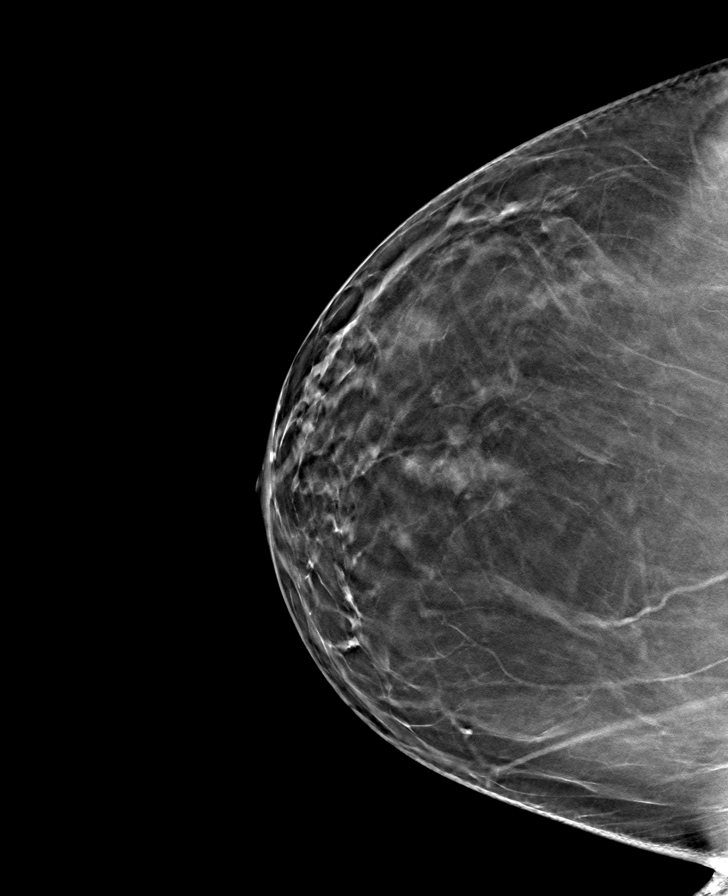

[L CC tomo · tomo slice 34/67.0]
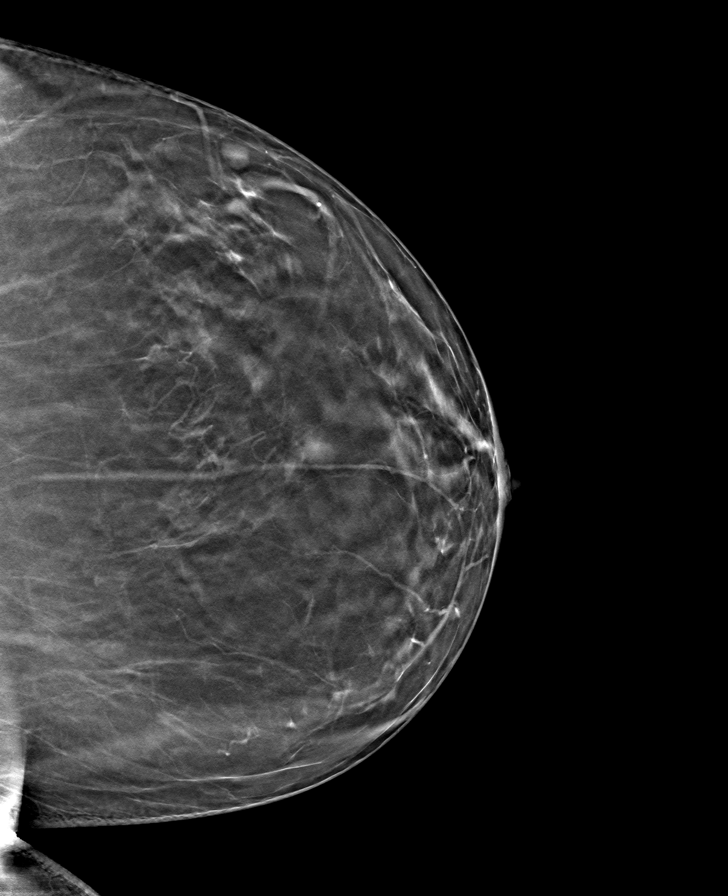

[R MLO tomo · tomo slice 45/89.0]
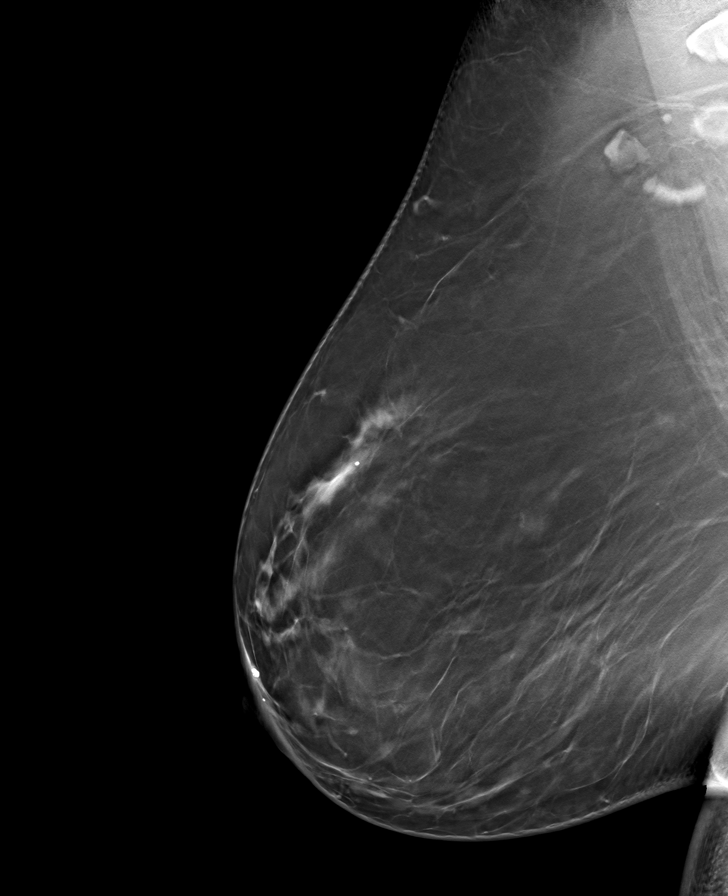

[8 of 24 positions shown; findings below may reference images not displayed]

ACR Breast Density Category b: There are scattered areas of
fibroglandular density.
FINDINGS: There are no findings suspicious for malignancy.
IMPRESSION: No mammographic evidence of malignancy. A result letter of this
screening mammogram will be mailed directly to the patient.

RECOMMENDATION:
Screening mammogram in one year. (Code:[BY])

BI-RADS CATEGORY  1: Negative.

## 2021-01-12 NOTE — Therapy (Signed)
[place] [facility] [address] [place], [address] Phone: [phone]   Fax:  [phone]  Physical Therapy Treatment  Patient Details  Name: Tina Stephenson MRN: [id number] Date of Birth: [birth year] Referring Provider (PT): Dwana Melena, [place]   Encounter Date: 01/12/2021   PT End of Session - 01/12/21 1330     Visit Number 25    Number of Visits 40    Date for PT Re-Evaluation 03/03/21    Authorization Type [employer] Managed    Authorization Time Period 20% CO-INSURANCE, DED MET, 60 DAYS PER CALENDER YEAR AFTER 5TH VISIT, MEDICAL NESSITY NEEDED/REF#1980    Progress Note Due on Visit 19    PT Start Time 1325    PT Stop Time 1415    PT Time Calculation (min) 50 min    Activity Tolerance Patient tolerated treatment well    Behavior During Therapy [facility] for tasks assessed/performed             Past Medical History:  Diagnosis Date   Bronchitis    Depression    Diabetes mellitus without complication ([place])    Gallbladder polyp    GERD (gastroesophageal reflux disease)    Hypertension    Hypertensive disorder 03/05/2015   Liver tumor (benign)    Sacrum and coccyx fracture ([place])    Sciatica     Past Surgical History:  Procedure Laterality Date   APPENDECTOMY     NASAL SEPTUM SURGERY     WISDOM TOOTH EXTRACTION      There were no vitals filed for this visit.   Subjective Assessment - 01/12/21 1328     Subjective Pt endorses 8-9/10 R shoulder pain down into arms. Pt denies N/T, reports returning to work tuesday which she attributes to her pain.    Pertinent History Tina Stephenson is a 24yoF referred by Dwana Melena, PA s/p right rotator cuff repair on 10/14/2020 c Dr. Erlinda Hong at [facility] s/p full-thickness tear of Supraspinatus. Pt also has Left supraspinatus full thickness tear, operative management awaiting full recovery from Rt surgery. She was immobilized post procedure, then on 11/17 allowed to DC the shoulder  sling.    Patient Stated Goals To move dominant arm, no pain, full range, return to work    Currently in Pain? Yes    Pain Score 8     Pain Location Shoulder    Pain Orientation Right    Pain Descriptors / Indicators Sore;Tightness;Sharp    Pain Type Surgical pain            Shoulder AROM  pre-session  Flexion: 85 degrees  Abduction: 60 degrees   ER/IR abducted to 90 deg: 45/62 degrees  Manual therapy:   R GHJ AP/PA/inferior in supine, 3x5 bouts, 15 sec/bout grades 2-3 mobs for improved AROM (20 minutes)  There.ex:  Supine R elbow flexion/extension/pronation/supination: x20/plane of motion Supine R shoulder ER/IR abducted at 90 degrees: x15/direction Seated B shoulder ER with YTB: 2x8, cuing for scap retraction with shoulder ER. A little tremulous with performance. Pt attributes to weakness.  R shoulder scaption AAROM at [place]: 1x20 reps  L side lying R shoulder flexion in gravity reduced position: 2x20. Initially AAROM provided for first 5 reps then excellent carryover.    Shoulder AROM post session:   Flexion: 90 degrees  Abduction: 71 degrees  Pain post session 5-6/10 NPS    PT Short Term Goals - 01/06/21 1219       PT SHORT TERM GOAL #  1   Title FOTO improved to 35%    Baseline Unable to access FOTO information and retrieve questionnaire for pt to fill out. (11/22/2020)    Time 4    Period Weeks    Status On-going    Target Date 11/23/20      PT SHORT TERM GOAL #2   Title right shoulder PROM flexion & abduction 100*    Baseline Supine AAROM R shoulder 115 degrees flexion, 91 degrees abduction (11/22/2020); supine AAROM R shoulder flexion 120 degrees, abduction 97 degrees (12/22/2020); 131 degrees flexion AAROM, 100 degrees abduction (01/06/2021)    Time 4    Period Weeks    Status Achieved    Target Date 11/23/20      PT SHORT TERM GOAL #3   Title Patient is independent with initial HEP; No questions after reviewed and redemonstrated today (11/22/2020)     Time 4    Period Weeks    Status Achieved    Target Date 11/23/20               PT Long Term Goals - 01/06/21 1221       PT LONG TERM GOAL #1   Title Patient reports pain in right shoulder </= 2/10 with activities    Baseline 3/10 at most for the past 7 days (12/22/2020); 1-2/10 at worst, occasional 3/10 (01/06/2021)    Time 8    Period Weeks    Status Partially Met    Target Date 03/03/21      PT LONG TERM GOAL #2   Title FOTO >/= 55%    Time 10    Period Weeks    Status Unable to assess    Target Date 01/07/21      PT LONG TERM GOAL #3   Title Right shoulder AROM Flexion & abduction 120* and external & internal rotation 60*    Baseline R shoulder AROM flexion 73 degrees (improved to 95 degrees after session), abduction 55 degrees (improved to 72 degrees after session), ER 26 degrees, IR 37 degrees (rotation ROM at 80-90 degrees abduction position) (12/22/2020); 83 degrees flexion, 61 degrees abduction, 42 degrees ER and IR at 90 degrees abduction (01/06/2021)    Time 8    Period Weeks    Status On-going    Target Date 03/03/21      PT LONG TERM GOAL #4   Title Right shoulder strength >/= 4/5    Baseline R shoulder strength about 3-/5 secondary to limites AROM. (12/22/2020); At available range: ER 4/5, IR 4/5, flexion and abduction 3-/5 (01/06/2021)    Time 8    Period Weeks    Status Partially Met    Target Date 03/03/21                   Plan - 01/12/21 1405     Clinical Impression Statement Continuing PT POC with focus on AROM and strengthening. Pre test and post test of shoulder flex/abduction improved with use of manual techniques and therapeutic exercise which also decreased patient's pain from 7-8/10 to 5-6/10 NPS. Pt still demonstrates limitations in joint capsule mobility (inferior mostly thus limiting shoulder abductionP), and strength thus limiting overhead mobility for ADL completion. Pt will benefit from skilled PT services to progress shoulder  AROM, strength to return to independence with overhead ADL completion without compensation.    Personal Factors and Comorbidities Comorbidity 3+    Comorbidities DM2, cervical herniated disc, scoliosis, obesity, HTN,    Examination-Activity Limitations  Lift;Carry;Reach Overhead;Sleep    Examination-Participation Restrictions Occupation;Yard Work    Stability/Clinical Decision Making Stable/Uncomplicated    Rehab Potential Good    PT Frequency 2x / week    PT Duration 8 weeks    PT Treatment/Interventions ADLs/Self Care Home Management;Cryotherapy;Electrical Stimulation;Moist Heat;Functional mobility training;Therapeutic activities;Therapeutic exercise;Balance training;Neuromuscular re-education;Patient/family education;Manual techniques;Scar mobilization;Passive range of motion;Vasopneumatic Device;Joint Manipulations    PT Next Visit Plan HEP, PROM & manual therapy to increase ROM; check measurements    PT Home Exercise Plan Access Code: [email or web address]; Medbridge Access Code [email or web address]    Consulted and Agree with Plan of Care Patient             Patient will benefit from skilled therapeutic intervention in order to improve the following deficits and impairments:  Decreased coordination, Decreased endurance, Decreased range of motion, Decreased strength, Increased edema, Increased muscle spasms, Postural dysfunction, Obesity, Pain  Visit Diagnosis: Muscle weakness (generalized)  Acute pain of right shoulder  Stiffness of right shoulder, not elsewhere classified  Abnormal posture     Problem List Patient Active Problem List   Diagnosis Date Noted   Upper respiratory infection, acute 12/20/2020   Bruised toe 12/20/2020   Traumatic tear of supraspinatus tendon of right shoulder 09/24/2020   Tendinopathy of right biceps tendon 09/24/2020   Traumatic tear of supraspinatus tendon of left shoulder 09/24/2020   Leg swelling 08/02/2020   Dyslipidemia 01/05/2020   Cervical polyp 08/22/2018    Nocturia 08/22/2018   Intertrigo 08/22/2018   Type 2 diabetes mellitus without complication, without long-term current use of insulin ([place]) 08/22/2018   Cervical spine arthritis with nerve pain 06/25/2018   Herniated disc, cervical 06/25/2018   History of migraine 03/25/2018   Scoliosis 03/25/2018   History of cyst of breast 03/25/2018   Sacrum and coccyx fracture, sequela 02/21/2018   Class 3 severe obesity due to excess calories without serious comorbidity with body mass index (BMI) of 45.0 to 49.9 in adult [facility]) 02/21/2018   Cough 02/21/2018   Onychomycosis 02/21/2018   History of hypertension 02/21/2018   Vitamin D deficiency 03/19/2015   Essential hypertension 03/05/2015    Salem Caster. Fairly IV, PT, DPT Physical Therapist- [facility]  01/12/2021, 2:24 PM  [place] [facility] [address], [place], [address] Phone: [phone]   Fax:  [phone]  Name: Tina Stephenson MRN: [id number] Date of Birth: 1956/01/09

## 2021-01-13 ENCOUNTER — Ambulatory Visit: Payer: Managed Care, Other (non HMO)

## 2021-01-13 DIAGNOSIS — M25511 Pain in right shoulder: Secondary | ICD-10-CM

## 2021-01-13 DIAGNOSIS — R293 Abnormal posture: Secondary | ICD-10-CM

## 2021-01-13 DIAGNOSIS — M6281 Muscle weakness (generalized): Secondary | ICD-10-CM | POA: Diagnosis not present

## 2021-01-13 DIAGNOSIS — M25611 Stiffness of right shoulder, not elsewhere classified: Secondary | ICD-10-CM

## 2021-01-13 NOTE — Therapy (Signed)
[facility] [address], [place], [address] Phone: [phone]   Fax:  [phone]  Physical Therapy Treatment  Patient Details  Name: Tina Stephenson MRN: [id number] Date of Birth: Jan 17, 1955 Referring Provider (PT): Dwana Melena, [place]   Encounter Date: 01/13/2021   PT End of Session - 01/13/21 0809     Visit Number 26    Number of Visits 40    Date for PT Re-Evaluation 03/03/21    Authorization Type [employer] Managed    Authorization Time Period 20% CO-INSURANCE, DED MET, 60 DAYS PER CALENDER YEAR AFTER 5TH VISIT, MEDICAL NESSITY NEEDED/REF#[id number]    Progress Note Due on Visit 79    PT Start Time 0809    PT Stop Time 0903    PT Time Calculation (min) 54 min    Activity Tolerance Patient tolerated treatment well    Behavior During Therapy [facility] for tasks assessed/performed             Past Medical History:  Diagnosis Date   Bronchitis    Depression    Diabetes mellitus without complication ([place])    Gallbladder polyp    GERD (gastroesophageal reflux disease)    Hypertension    Hypertensive disorder 03/05/2015   Liver tumor (benign)    Sacrum and coccyx fracture ([place])    Sciatica     Past Surgical History:  Procedure Laterality Date   APPENDECTOMY     NASAL SEPTUM SURGERY     WISDOM TOOTH EXTRACTION      There were no vitals filed for this visit.   Subjective Assessment - 01/13/21 0810     Subjective R shoulder is better compared to yesterday. Feels a tightness R anterior shoulder. R shoulder to elbow bothered her while working at her computer desk. Moving the mouse bothers her. 1-2/10 currently but has not worked yet.    Pertinent History Tina Stephenson is a 75yoF referred by Dwana Melena, PA s/p right rotator cuff repair on 10/14/2020 c Dr. Erlinda Hong at [facility] s/p full-thickness tear of Supraspinatus. Pt also has Left supraspinatus full thickness tear, operative management awaiting full recovery from Rt  surgery. She was immobilized post procedure, then on 11/17 allowed to DC the shoulder sling.    Patient Stated Goals To move dominant arm, no pain, full range, return to work    Currently in Pain? Yes    Pain Score 2                                         PT Education - 01/13/21 0856     Education Details ther-ex    Person(s) Educated Patient    Methods Explanation;Demonstration;Tactile cues;Verbal cues    Comprehension Returned demonstration;Verbalized understanding           Objective   Medbridge Access Code [email or web address]   Pt protocol under Media section, under Physician Order on 11/10/2020      Manual therapy   Seated STM distal R pectoralis muscle   Seated STM R upper trap muscle to decrease tension     Therapeutic exercises    Simulating computer table and mouse  L scapular protraction and pectoralis muscle palpated  Unable to perform standing pectoralis stretches therefore performed in supine Supine scapular retraction to promote R pectoralis muscle stretch 30 seconds x 5  Reviewed and given as part of her HEP. Pt  demonstrated and verbalized understanding. Handout provided.    Standing R shoulder extension with scapular retraction yellow band  5x5 seconds. Difficult  Standing scapular retraction with mod tactile cues for posterior tipping 10x10 seconds for 2 sets  R shoulder AAROM flexion with gentle resisted shoulder extension 10x   Ice to R shoulder x 15 min at end of session.        Improved exercise technique, movement at target joints, use of target muscles after min to mod verbal, visual, tactile cues.        Response to treatment Fair tolerance to today's session     Clinical impression. Worked on scapular retraction and pectoralis stretch to decrease anterior pressure to R shoulder when working on her desk and moving her computer mouse. Mod to max cues needed for posterior scapular tipping. Fair tolerance to today's  session. Pt will benefit from continued skilled physical therapy services to decrease pain, improve ROM, strength and function.      PT Short Term Goals - 01/06/21 1219       PT SHORT TERM GOAL #1   Title FOTO improved to 35%    Baseline Unable to access FOTO information and retrieve questionnaire for pt to fill out. (11/22/2020)    Time 4    Period Weeks    Status On-going    Target Date 11/23/20      PT SHORT TERM GOAL #2   Title right shoulder PROM flexion & abduction 100*    Baseline Supine AAROM R shoulder 115 degrees flexion, 91 degrees abduction (11/22/2020); supine AAROM R shoulder flexion 120 degrees, abduction 97 degrees (12/22/2020); 131 degrees flexion AAROM, 100 degrees abduction (01/06/2021)    Time 4    Period Weeks    Status Achieved    Target Date 11/23/20      PT SHORT TERM GOAL #3   Title Patient is independent with initial HEP; No questions after reviewed and redemonstrated today (11/22/2020)    Time 4    Period Weeks    Status Achieved    Target Date 11/23/20               PT Long Term Goals - 01/06/21 1221       PT LONG TERM GOAL #1   Title Patient reports pain in right shoulder </= 2/10 with activities    Baseline 3/10 at most for the past 7 days (12/22/2020); 1-2/10 at worst, occasional 3/10 (01/06/2021)    Time 8    Period Weeks    Status Partially Met    Target Date 03/03/21      PT LONG TERM GOAL #2   Title FOTO >/= 55%    Time 10    Period Weeks    Status Unable to assess    Target Date 01/07/21      PT LONG TERM GOAL #3   Title Right shoulder AROM Flexion & abduction 120* and external & internal rotation 60*    Baseline R shoulder AROM flexion 73 degrees (improved to 95 degrees after session), abduction 55 degrees (improved to 72 degrees after session), ER 26 degrees, IR 37 degrees (rotation ROM at 80-90 degrees abduction position) (12/22/2020); 83 degrees flexion, 61 degrees abduction, 42 degrees ER and IR at 90 degrees abduction  (01/06/2021)    Time 8    Period Weeks    Status On-going    Target Date 03/03/21      PT LONG TERM GOAL #4   Title Right shoulder  strength >/= 4/5    Baseline R shoulder strength about 3-/5 secondary to limites AROM. (12/22/2020); At available range: ER 4/5, IR 4/5, flexion and abduction 3-/5 (01/06/2021)    Time 8    Period Weeks    Status Partially Met    Target Date 03/03/21                   Plan - 01/13/21 0856     Clinical Impression Statement Worked on scapular retraction and pectoralis stretch to decrease anterior pressure to R shoulder when working on her desk and moving her computer mouse. Mod to max cues needed for posterior scapular tipping. Fair tolerance to today's session. Pt will benefit from continued skilled physical therapy services to decrease pain, improve ROM, strength and function.    Personal Factors and Comorbidities Comorbidity 3+    Comorbidities DM2, cervical herniated disc, scoliosis, obesity, HTN,    Examination-Activity Limitations Lift;Carry;Reach Overhead;Sleep    Examination-Participation Restrictions Occupation;Yard Work    Stability/Clinical Decision Making Stable/Uncomplicated    Rehab Potential Good    PT Frequency 2x / week    PT Duration 8 weeks    PT Treatment/Interventions ADLs/Self Care Home Management;Cryotherapy;Electrical Stimulation;Moist Heat;Functional mobility training;Therapeutic activities;Therapeutic exercise;Balance training;Neuromuscular re-education;Patient/family education;Manual techniques;Scar mobilization;Passive range of motion;Vasopneumatic Device;Joint Manipulations    PT Next Visit Plan HEP, PROM & manual therapy to increase ROM; check measurements    PT Home Exercise Plan Access Code: [email or web address]; Medbridge Access Code [email or web address]    Consulted and Agree with Plan of Care Patient             Patient will benefit from skilled therapeutic intervention in order to improve the following deficits and impairments:   Decreased coordination, Decreased endurance, Decreased range of motion, Decreased strength, Increased edema, Increased muscle spasms, Postural dysfunction, Obesity, Pain  Visit Diagnosis: Muscle weakness (generalized)  Acute pain of right shoulder  Stiffness of right shoulder, not elsewhere classified  Abnormal posture     Problem List Patient Active Problem List   Diagnosis Date Noted   Upper respiratory infection, acute 12/20/2020   Bruised toe 12/20/2020   Traumatic tear of supraspinatus tendon of right shoulder 09/24/2020   Tendinopathy of right biceps tendon 09/24/2020   Traumatic tear of supraspinatus tendon of left shoulder 09/24/2020   Leg swelling 08/02/2020   Dyslipidemia 01/05/2020   Cervical polyp 08/22/2018   Nocturia 08/22/2018   Intertrigo 08/22/2018   Type 2 diabetes mellitus without complication, without long-term current use of insulin ([place]) 08/22/2018   Cervical spine arthritis with nerve pain 06/25/2018   Herniated disc, cervical 06/25/2018   History of migraine 03/25/2018   Scoliosis 03/25/2018   History of cyst of breast 03/25/2018   Sacrum and coccyx fracture, sequela 02/21/2018   Class 3 severe obesity due to excess calories without serious comorbidity with body mass index (BMI) of 45.0 to 49.9 in adult [facility]) 02/21/2018   Cough 02/21/2018   Onychomycosis 02/21/2018   History of hypertension 02/21/2018   Vitamin D deficiency 03/19/2015   Essential hypertension 03/05/2015    Joneen Boers PT, DPT  01/13/2021, 9:13 AM  [facility] [address], [place], [address] Phone: [phone]   Fax:  [phone]  Name: Tina Stephenson MRN: [id number] Date of Birth: 06/15/1955

## 2021-01-17 ENCOUNTER — Ambulatory Visit: Payer: Managed Care, Other (non HMO)

## 2021-01-17 DIAGNOSIS — M25511 Pain in right shoulder: Secondary | ICD-10-CM

## 2021-01-17 DIAGNOSIS — M6281 Muscle weakness (generalized): Secondary | ICD-10-CM | POA: Diagnosis not present

## 2021-01-17 DIAGNOSIS — M25611 Stiffness of right shoulder, not elsewhere classified: Secondary | ICD-10-CM

## 2021-01-17 NOTE — Therapy (Signed)
[facility] [address] [place], [address] Phone: [phone]   Fax:  [phone]  Physical Therapy Treatment  Patient Details  Name: Tina Stephenson MRN: [id number] Date of Birth: 1955-11-26 Referring Provider (PT): Dwana Melena, [place]   Encounter Date: 01/17/2021   PT End of Session - 01/17/21 1456     Visit Number 27    Number of Visits 40    Date for PT Re-Evaluation 03/03/21    Authorization Type [employer] Managed    Authorization Time Period 20% CO-INSURANCE, DED MET, 60 DAYS PER CALENDER YEAR AFTER 5TH VISIT, MEDICAL NESSITY NEEDED/REF#1980    PT Start Time 1749    PT Stop Time 1546    PT Time Calculation (min) 49 min    Activity Tolerance Patient tolerated treatment well    Behavior During Therapy WFL for tasks assessed/performed             Past Medical History:  Diagnosis Date   Bronchitis    Depression    Diabetes mellitus without complication ([place])    Gallbladder polyp    GERD (gastroesophageal reflux disease)    Hypertension    Hypertensive disorder 03/05/2015   Liver tumor (benign)    Sacrum and coccyx fracture ([place])    Sciatica     Past Surgical History:  Procedure Laterality Date   APPENDECTOMY     NASAL SEPTUM SURGERY     WISDOM TOOTH EXTRACTION      There were no vitals filed for this visit.   Subjective Assessment - 01/17/21 1458     Subjective R shoulder is sore but pt jumped and reached to turn her alarm clock off this morning. 2-3/10 currently. Tries to keep her shoulder blade back when moving her computer mouse.    Pertinent History Tina Stephenson is a 67yoF referred by Dwana Melena, PA s/p right rotator cuff repair on 10/14/2020 c Dr. Erlinda Hong at [facility] s/p full-thickness tear of Supraspinatus. Pt also has Left supraspinatus full thickness tear, operative management awaiting full recovery from Rt surgery. She was immobilized post procedure, then on 11/17 allowed to DC the  shoulder sling.    Patient Stated Goals To move dominant arm, no pain, full range, return to work    Currently in Pain? Yes    Pain Score 3                                         PT Education - 01/17/21 1749     Education Details ther-ex    Person(s) Educated Patient    Methods Explanation;Demonstration;Tactile cues;Verbal cues    Comprehension Returned demonstration;Verbalized understanding            Objective   Medbridge Access Code [email or web address]   Pt protocol under Media section, under Physician Order on 11/10/2020      Manual therapy    Supine posterior  glide grade 1 to 3 - with arm in comfortabel abduction  For pain control and improve joint mobility  Supine STM distal R pectoralis muscle   Supine STM R teres major muscle to decrease tension   Seated STM R petoralis muscle to decrease soreness  Seated STM R upper trap and distal scalene muscle area to decrease tension      Therapeutic exercises    Supine R shouler AAROM  Flexion 10x5 seconds   Scaption 10x5  seconds  In scapular plane   ER 10x5 seconds    IR 10x5 seconds  Supine R shoulder AROM   Flexion 10x  Scaption 5x2   Standing R shoulder flexion isometrics 50% effort 10x10 seconds   Seated R SCM stretch 15 seconds x 2   No R pectoralis muscle discomfort afterwards.      Improved exercise technique, movement at target joints, use of target muscles after min to mod verbal, visual, tactile cues.        Response to treatment Decreased R shoulder soreness.      Clinical impression. Worked on gentle R shoulder joint mobility and decreasing muscle tension around shoulder to promote better mechanics. Decreased R shoulder and pectoralis discomfort after session. Continued working on R shoulder ROM to decrease stiffness. Pt will benefit from continued skilled physical therapy services to decrease pain, improve ROM, strength and function.       PT Short Term Goals -  01/06/21 1219       PT SHORT TERM GOAL #1   Title FOTO improved to 35%    Baseline Unable to access FOTO information and retrieve questionnaire for pt to fill out. (11/22/2020)    Time 4    Period Weeks    Status On-going    Target Date 11/23/20      PT SHORT TERM GOAL #2   Title right shoulder PROM flexion & abduction 100*    Baseline Supine AAROM R shoulder 115 degrees flexion, 91 degrees abduction (11/22/2020); supine AAROM R shoulder flexion 120 degrees, abduction 97 degrees (12/22/2020); 131 degrees flexion AAROM, 100 degrees abduction (01/06/2021)    Time 4    Period Weeks    Status Achieved    Target Date 11/23/20      PT SHORT TERM GOAL #3   Title Patient is independent with initial HEP; No questions after reviewed and redemonstrated today (11/22/2020)    Time 4    Period Weeks    Status Achieved    Target Date 11/23/20               PT Long Term Goals - 01/06/21 1221       PT LONG TERM GOAL #1   Title Patient reports pain in right shoulder </= 2/10 with activities    Baseline 3/10 at most for the past 7 days (12/22/2020); 1-2/10 at worst, occasional 3/10 (01/06/2021)    Time 8    Period Weeks    Status Partially Met    Target Date 03/03/21      PT LONG TERM GOAL #2   Title FOTO >/= 55%    Time 10    Period Weeks    Status Unable to assess    Target Date 01/07/21      PT LONG TERM GOAL #3   Title Right shoulder AROM Flexion & abduction 120* and external & internal rotation 60*    Baseline R shoulder AROM flexion 73 degrees (improved to 95 degrees after session), abduction 55 degrees (improved to 72 degrees after session), ER 26 degrees, IR 37 degrees (rotation ROM at 80-90 degrees abduction position) (12/22/2020); 83 degrees flexion, 61 degrees abduction, 42 degrees ER and IR at 90 degrees abduction (01/06/2021)    Time 8    Period Weeks    Status On-going    Target Date 03/03/21      PT LONG TERM GOAL #4   Title Right shoulder strength >/= 4/5     Baseline R shoulder strength  about 3-/5 secondary to limites AROM. (12/22/2020); At available range: ER 4/5, IR 4/5, flexion and abduction 3-/5 (01/06/2021)    Time 8    Period Weeks    Status Partially Met    Target Date 03/03/21                   Plan - 01/17/21 1453     Clinical Impression Statement Worked on gentle R shoulder joint mobility and decreasing muscle tension around shoulder to promote better mechanics. Decreased R shoulder and pectoralis discomfort after session. Continued working on R shoulder ROM to decrease stiffness. Pt will benefit from continued skilled physical therapy services to decrease pain, improve ROM, strength and function.    Personal Factors and Comorbidities Comorbidity 3+    Comorbidities DM2, cervical herniated disc, scoliosis, obesity, HTN,    Examination-Activity Limitations Lift;Carry;Reach Overhead;Sleep    Examination-Participation Restrictions Occupation;Yard Work    Stability/Clinical Decision Making Stable/Uncomplicated    Rehab Potential Good    PT Frequency 2x / week    PT Duration 8 weeks    PT Treatment/Interventions ADLs/Self Care Home Management;Cryotherapy;Electrical Stimulation;Moist Heat;Functional mobility training;Therapeutic activities;Therapeutic exercise;Balance training;Neuromuscular re-education;Patient/family education;Manual techniques;Scar mobilization;Passive range of motion;Vasopneumatic Device;Joint Manipulations    PT Next Visit Plan HEP, PROM & manual therapy to increase ROM; check measurements    PT Home Exercise Plan Access Code: [email or web address]; Medbridge Access Code [email or web address]    Consulted and Agree with Plan of Care Patient             Patient will benefit from skilled therapeutic intervention in order to improve the following deficits and impairments:  Decreased coordination, Decreased endurance, Decreased range of motion, Decreased strength, Increased edema, Increased muscle spasms, Postural dysfunction, Obesity,  Pain  Visit Diagnosis: Muscle weakness (generalized)  Acute pain of right shoulder  Stiffness of right shoulder, not elsewhere classified     Problem List Patient Active Problem List   Diagnosis Date Noted   Upper respiratory infection, acute 12/20/2020   Bruised toe 12/20/2020   Traumatic tear of supraspinatus tendon of right shoulder 09/24/2020   Tendinopathy of right biceps tendon 09/24/2020   Traumatic tear of supraspinatus tendon of left shoulder 09/24/2020   Leg swelling 08/02/2020   Dyslipidemia 01/05/2020   Cervical polyp 08/22/2018   Nocturia 08/22/2018   Intertrigo 08/22/2018   Type 2 diabetes mellitus without complication, without long-term current use of insulin ([place]) 08/22/2018   Cervical spine arthritis with nerve pain 06/25/2018   Herniated disc, cervical 06/25/2018   History of migraine 03/25/2018   Scoliosis 03/25/2018   History of cyst of breast 03/25/2018   Sacrum and coccyx fracture, sequela 02/21/2018   Class 3 severe obesity due to excess calories without serious comorbidity with body mass index (BMI) of 45.0 to 49.9 in adult [facility]) 02/21/2018   Cough 02/21/2018   Onychomycosis 02/21/2018   History of hypertension 02/21/2018   Vitamin D deficiency 03/19/2015   Essential hypertension 03/05/2015   Joneen Boers PT, DPT  01/17/2021, 5:54 PM  [facility] [address], [place], [address] Phone: [phone]   Fax:  [phone]  Name: Tina Stephenson MRN: [id number] Date of Birth: [birth year]

## 2021-01-18 ENCOUNTER — Encounter: Payer: Self-pay | Admitting: Physical Medicine and Rehabilitation

## 2021-01-18 ENCOUNTER — Ambulatory Visit: Payer: Self-pay

## 2021-01-18 ENCOUNTER — Other Ambulatory Visit: Payer: Self-pay

## 2021-01-18 ENCOUNTER — Ambulatory Visit: Payer: Managed Care, Other (non HMO) | Admitting: Physical Medicine and Rehabilitation

## 2021-01-18 DIAGNOSIS — M25511 Pain in right shoulder: Secondary | ICD-10-CM | POA: Diagnosis not present

## 2021-01-18 DIAGNOSIS — G8929 Other chronic pain: Secondary | ICD-10-CM

## 2021-01-18 NOTE — Progress Notes (Signed)
° °  Tina Stephenson - 66 y.o. female MRN [id number]  Date of birth: [birth year]  Office Visit Note: Visit Date: 01/18/2021 PCP: Delsa Grana, PA-C Referred by: Delsa Grana, PA-C  Subjective: Chief Complaint  Patient presents with   Right Shoulder - Pain   HPI:  [name] is a 66 y.o. female who comes in today at the request of Dr. Eduard Roux for planned Right anesthetic glenohumeral arthrogram with fluoroscopic guidance.  The patient has failed conservative care including home exercise, medications, time and activity modification.  This injection will be diagnostic and hopefully therapeutic.  Please see requesting physician notes for further details and justification.   ROS Otherwise per HPI.  Assessment & Plan: Visit Diagnoses:    ICD-10-CM   1. Chronic right shoulder pain  M25.511 Large Joint Inj: R hip joint   G89.29 XR C-ARM NO REPORT      Plan: No additional findings.   Meds & Orders: No orders of the defined types were placed in this encounter.   Orders Placed This Encounter  Procedures   Large Joint Inj: R hip joint   XR C-ARM NO REPORT    Follow-up: Return in 4 weeks (on 02/15/2021) for Eduard Roux, MD.   Procedures: Large Joint Inj: R hip joint on 01/18/2021 10:03 AM Indications: diagnostic evaluation and pain Details: 22 G 3.5 in needle, fluoroscopy-guided anterior approach  Arthrogram: No  Medications: 4 mL bupivacaine 0.25 %; 60 mg triamcinolone acetonide 40 MG/ML Outcome: tolerated well, no immediate complications  There was excellent flow of contrast producing a partial arthrogram of the hip. The patient did have some mild relief of symptoms during the anesthetic phase of the injection but with continued decrease ROM. Procedure, treatment alternatives, risks and benefits explained, specific risks discussed. Consent was given by the patient. Immediately prior to procedure a time out was called to verify the correct patient, procedure, equipment, support staff and  site/side marked as required. Patient was prepped and draped in the usual sterile fashion.         Clinical History: No specialty comments available.     Objective:  VS:  HT:     WT:    BMI:      BP:    HR: bpm   TEMP: ( )   RESP:  Physical Exam   Imaging: No results found.

## 2021-01-18 NOTE — Progress Notes (Signed)
Pt state right shoulder pain. Pt state lifting her right arm makes the pain worse. Pt state she takes pain meds and uses ice to help ease her pain.  Numeric Pain Rating Scale and Functional Assessment Average Pain 3   In the last MONTH (on 0-10 scale) has pain interfered with the following?  1. General activity like being  able to carry out your everyday physical activities such as walking, climbing stairs, carrying groceries, or moving a chair?  Rating(9)   -BT, -Dye Allergies.

## 2021-01-19 ENCOUNTER — Ambulatory Visit: Payer: Managed Care, Other (non HMO)

## 2021-01-19 DIAGNOSIS — M25511 Pain in right shoulder: Secondary | ICD-10-CM

## 2021-01-19 DIAGNOSIS — M6281 Muscle weakness (generalized): Secondary | ICD-10-CM | POA: Diagnosis not present

## 2021-01-19 DIAGNOSIS — M25611 Stiffness of right shoulder, not elsewhere classified: Secondary | ICD-10-CM

## 2021-01-19 NOTE — Therapy (Signed)
[facility] [address] [place], [address] Phone: [phone]   Fax:  [phone]  Physical Therapy Treatment  Patient Details  Name: Tina Stephenson MRN: [id number] Date of Birth: 12/17/1955 Referring Provider (PT): Dwana Melena, [place]   Encounter Date: 01/19/2021   PT End of Session - 01/19/21 1328     Visit Number 28    Number of Visits 40    Date for PT Re-Evaluation 03/03/21    Authorization Type [employer] Managed    Authorization Time Period 20% CO-INSURANCE, DED MET, 60 DAYS PER CALENDER YEAR AFTER 5TH VISIT, MEDICAL NESSITY NEEDED/REF#1980    PT Start Time 83    PT Stop Time 1421    PT Time Calculation (min) 53 min    Activity Tolerance Patient tolerated treatment well    Behavior During Therapy WFL for tasks assessed/performed             Past Medical History:  Diagnosis Date   Bronchitis    Depression    Diabetes mellitus without complication ([place])    Gallbladder polyp    GERD (gastroesophageal reflux disease)    Hypertension    Hypertensive disorder 03/05/2015   Liver tumor (benign)    Sacrum and coccyx fracture ([place])    Sciatica     Past Surgical History:  Procedure Laterality Date   APPENDECTOMY     NASAL SEPTUM SURGERY     WISDOM TOOTH EXTRACTION      There were no vitals filed for this visit.   Subjective Assessment - 01/19/21 1328     Subjective Had a cotisone shot to her shoulder yesterday. R shoulder feels like a 1-2/10 currently but feels like she can raise her arm up. Was hurting a lot yesterday afterwards.    Pertinent History Tina Stephenson is a 24yoF referred by Dwana Melena, PA s/p right rotator cuff repair on 10/14/2020 c Dr. Erlinda Hong at [facility] s/p full-thickness tear of Supraspinatus. Pt also has Left supraspinatus full thickness tear, operative management awaiting full recovery from Rt surgery. She was immobilized post procedure, then on 11/17 allowed to DC the shoulder  sling.    Patient Stated Goals To move dominant arm, no pain, full range, return to work    Currently in Pain? Yes    Pain Score 2                                         PT Education - 01/19/21 1448     Education Details ther-ex    Person(s) Educated Patient    Methods Explanation;Demonstration;Tactile cues;Verbal cues    Comprehension Returned demonstration;Verbalized understanding           Objective   Medbridge Access Code [email or web address]   Pt protocol under Media section, under Physician Order on 11/10/2020      Manual therapy     Supine STM distal R pectoralis muscle    Supine STM R teres major muscle to decrease tension    Seated STM R petoralis muscle to decrease soreness   Seated STM R upper trap and distal scalene muscle area to decrease tension        Therapeutic exercises    Standing R shoulder AROM   Flexion 110 degrees  Abduction 84 degrees  Supine R shouler AAROM             Flexion  10x5 seconds              Scaption 10x5 seconds             In scapular plane                         ER 10x5 seconds                          IR 10x5 seconds   Supine R shoulder AROM              Flexion 10x             Scaption 5x, then 7x     Standing R shoulder flexion isometrics 50% effort 10x10 seconds   Yellow T-band, neutral arm  ER 5x3   IR 5x3  Scapular retraction B red band 10x3, then 10x5 seconds  (Easy to medium difficulty per pt)   Standing B shoulder extension with scapular retraction yellow band 10x3  Standing R shoulder flexion onto 2nd shelf 5x, then 3x  Standing R elbow extension isometrics manually resisted 10x2 with 5 second holds   Anterior shoulder discomfort from flexion AROM moved to clavicle   Upright R SCM stretch 15 seconds x 2   Upright R scalene stretch 30 seconds x 2   Decreased discomfort at clavicle afterwards with shoulder flexion after aforementioned 2 stretches   Standing R shoulder AROM  after session   Flexion 131 degrees  Abduction 107 degrees         Improved exercise technique, movement at target joints, use of target muscles after min to mod verbal, visual, tactile cues.        Response to treatment Pt tolerated session well without aggravation of symptoms.      Clinical impression. Pt demonstrates significant improvement in R shoulder flexion and abduction AROM following her procedure yesterday with pt being able to achieve 131 degrees flexion and 107 degrees abduction after treatment today. Continued working on R shoulder ROM to decrease stiffness and strengthening to improve ability to reach with less difficulty. Pt will benefit from continued skilled physical therapy services to decrease pain, improve ROM, strength and function.         PT Short Term Goals - 01/06/21 1219       PT SHORT TERM GOAL #1   Title FOTO improved to 35%    Baseline Unable to access FOTO information and retrieve questionnaire for pt to fill out. (11/22/2020)    Time 4    Period Weeks    Status On-going    Target Date 11/23/20      PT SHORT TERM GOAL #2   Title right shoulder PROM flexion & abduction 100*    Baseline Supine AAROM R shoulder 115 degrees flexion, 91 degrees abduction (11/22/2020); supine AAROM R shoulder flexion 120 degrees, abduction 97 degrees (12/22/2020); 131 degrees flexion AAROM, 100 degrees abduction (01/06/2021)    Time 4    Period Weeks    Status Achieved    Target Date 11/23/20      PT SHORT TERM GOAL #3   Title Patient is independent with initial HEP; No questions after reviewed and redemonstrated today (11/22/2020)    Time 4    Period Weeks    Status Achieved    Target Date 11/23/20               PT Long Term Goals -  01/06/21 1221       PT LONG TERM GOAL #1   Title Patient reports pain in right shoulder </= 2/10 with activities    Baseline 3/10 at most for the past 7 days (12/22/2020); 1-2/10 at worst, occasional 3/10 (01/06/2021)     Time 8    Period Weeks    Status Partially Met    Target Date 03/03/21      PT LONG TERM GOAL #2   Title FOTO >/= 55%    Time 10    Period Weeks    Status Unable to assess    Target Date 01/07/21      PT LONG TERM GOAL #3   Title Right shoulder AROM Flexion & abduction 120* and external & internal rotation 60*    Baseline R shoulder AROM flexion 73 degrees (improved to 95 degrees after session), abduction 55 degrees (improved to 72 degrees after session), ER 26 degrees, IR 37 degrees (rotation ROM at 80-90 degrees abduction position) (12/22/2020); 83 degrees flexion, 61 degrees abduction, 42 degrees ER and IR at 90 degrees abduction (01/06/2021)    Time 8    Period Weeks    Status On-going    Target Date 03/03/21      PT LONG TERM GOAL #4   Title Right shoulder strength >/= 4/5    Baseline R shoulder strength about 3-/5 secondary to limites AROM. (12/22/2020); At available range: ER 4/5, IR 4/5, flexion and abduction 3-/5 (01/06/2021)    Time 8    Period Weeks    Status Partially Met    Target Date 03/03/21                   Plan - 01/19/21 1326     Clinical Impression Statement Pt demonstrates significant improvement in R shoulder flexion and abduction AROM following her procedure yesterday with pt being able to achieve 131 degrees flexion and 107 degrees abduction after treatment today. Continued working on R shoulder ROM to decrease stiffness and strengthening to improve ability to reach with less difficulty. Pt will benefit from continued skilled physical therapy services to decrease pain, improve ROM, strength and function.    Personal Factors and Comorbidities Comorbidity 3+    Comorbidities DM2, cervical herniated disc, scoliosis, obesity, HTN,    Examination-Activity Limitations Lift;Carry;Reach Overhead;Sleep    Examination-Participation Restrictions Occupation;Yard Work    Stability/Clinical Decision Making Stable/Uncomplicated    Clinical Decision Making  Low    Rehab Potential Good    PT Frequency 2x / week    PT Duration 8 weeks    PT Treatment/Interventions ADLs/Self Care Home Management;Cryotherapy;Electrical Stimulation;Moist Heat;Functional mobility training;Therapeutic activities;Therapeutic exercise;Balance training;Neuromuscular re-education;Patient/family education;Manual techniques;Scar mobilization;Passive range of motion;Vasopneumatic Device;Joint Manipulations    PT Next Visit Plan HEP, PROM & manual therapy to increase ROM; check measurements    PT Home Exercise Plan Access Code: [email or web address]; Medbridge Access Code [email or web address]    Consulted and Agree with Plan of Care Patient             Patient will benefit from skilled therapeutic intervention in order to improve the following deficits and impairments:  Decreased coordination, Decreased endurance, Decreased range of motion, Decreased strength, Increased edema, Increased muscle spasms, Postural dysfunction, Obesity, Pain  Visit Diagnosis: Muscle weakness (generalized)  Acute pain of right shoulder  Stiffness of right shoulder, not elsewhere classified     Problem List Patient Active Problem List   Diagnosis Date Noted   Upper respiratory infection, acute 12/20/2020  Bruised toe 12/20/2020   Traumatic tear of supraspinatus tendon of right shoulder 09/24/2020   Tendinopathy of right biceps tendon 09/24/2020   Traumatic tear of supraspinatus tendon of left shoulder 09/24/2020   Leg swelling 08/02/2020   Dyslipidemia 01/05/2020   Cervical polyp 08/22/2018   Nocturia 08/22/2018   Intertrigo 08/22/2018   Type 2 diabetes mellitus without complication, without long-term current use of insulin ([place]) 08/22/2018   Cervical spine arthritis with nerve pain 06/25/2018   Herniated disc, cervical 06/25/2018   History of migraine 03/25/2018   Scoliosis 03/25/2018   History of cyst of breast 03/25/2018   Sacrum and coccyx fracture, sequela 02/21/2018   Class 3 severe obesity due  to excess calories without serious comorbidity with body mass index (BMI) of 45.0 to 49.9 in adult [facility]) 02/21/2018   Cough 02/21/2018   Onychomycosis 02/21/2018   History of hypertension 02/21/2018   Vitamin D deficiency 03/19/2015   Essential hypertension 03/05/2015   Joneen Boers PT, DPT  01/19/2021, 2:51 PM  [facility] [address] [place], [address] Phone: [phone]   Fax:  [phone]  Name: Adaly Puder MRN: [id number] Date of Birth: 06-09-1955

## 2021-01-20 MED ORDER — TRIAMCINOLONE ACETONIDE 40 MG/ML IJ SUSP
60.0000 mg | INTRAMUSCULAR | Status: AC | PRN
  Administered 2021-01-18: 60 mg via INTRA_ARTICULAR

## 2021-01-20 MED ORDER — BUPIVACAINE HCL 0.25 % IJ SOLN
4.0000 mL | INTRAMUSCULAR | Status: AC | PRN
  Administered 2021-01-18: 4 mL via INTRA_ARTICULAR

## 2021-01-24 ENCOUNTER — Ambulatory Visit: Payer: Managed Care, Other (non HMO)

## 2021-01-26 ENCOUNTER — Ambulatory Visit: Payer: Managed Care, Other (non HMO)

## 2021-01-26 DIAGNOSIS — M25511 Pain in right shoulder: Secondary | ICD-10-CM

## 2021-01-26 DIAGNOSIS — M25611 Stiffness of right shoulder, not elsewhere classified: Secondary | ICD-10-CM

## 2021-01-26 DIAGNOSIS — M6281 Muscle weakness (generalized): Secondary | ICD-10-CM | POA: Diagnosis not present
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
## 2021-01-29 ENCOUNTER — Telehealth: Payer: Self-pay | Admitting: Family Medicine

## 2021-01-29 DIAGNOSIS — G8929 Other chronic pain: Secondary | ICD-10-CM

## 2021-01-29 NOTE — Telephone Encounter (Signed)
Med dc'd 10/21/20  Lendon Collar RT  Requested Prescriptions  Refused Prescriptions Disp Refills   meloxicam (MOBIC) 7.5 MG tablet [Pharmacy Med Name: MELOXICAM 7.5MG  TABLETS] 90 tablet 2    Sig: TAKE 1 TABLET(7.5 MG) BY MOUTH DAILY AS NEEDED FOR PAIN     Analgesics:  COX2 Inhibitors Passed - 01/29/2021 10:07 AM      Passed - HGB in normal range and within 360 days    Hemoglobin  Date Value Ref Range Status  08/02/2020 12.5 11.1 - 15.9 g/dL Final         Passed - Cr in normal range and within 360 days    Creatinine, Ser  Date Value Ref Range Status  01/05/2021 0.73 0.57 - 1.00 mg/dL Final         Passed - Patient is not pregnant      Passed - Valid encounter within last 12 months    Recent Outpatient Visits          1 month ago Upper respiratory infection, acute   [facility] Mecum, Dani Gobble, PA-C   2 months ago Essential hypertension   [place], DO   4 months ago Essential hypertension   [facility] Delsa Grana, PA-C   5 months ago Essential hypertension   [place], DO   6 months ago Leg swelling   [facility] Myles Gip, DO      Future Appointments            In 3 months Delsa Grana, PA-C [facility]

## 2021-02-01 ENCOUNTER — Other Ambulatory Visit: Payer: Self-pay

## 2021-02-01 ENCOUNTER — Ambulatory Visit: Payer: Managed Care, Other (non HMO)

## 2021-02-01 DIAGNOSIS — M25511 Pain in right shoulder: Secondary | ICD-10-CM

## 2021-02-01 DIAGNOSIS — M25611 Stiffness of right shoulder, not elsewhere classified: Secondary | ICD-10-CM

## 2021-02-01 DIAGNOSIS — M6281 Muscle weakness (generalized): Secondary | ICD-10-CM | POA: Diagnosis not present

## 2021-02-01 DIAGNOSIS — R293 Abnormal posture: Secondary | ICD-10-CM

## 2021-02-01 NOTE — Therapy (Signed)
[place] [facility] [address], [place], [address] Phone: [phone]   Fax:  [phone]  Physical Therapy Treatment/Physical Therapy Progress Note   Dates of reporting period  12/22/21   to   02/01/21  Patient Details  Name: Tina Stephenson MRN: [id number] Date of Birth: 1955-11-16 Referring Provider (PT): Dwana Melena, [place]   Encounter Date: 02/01/2021   PT End of Session - 02/01/21 1334     Visit Number 30    Number of Visits 40    Date for PT Re-Evaluation 03/03/21    Authorization Type [employer] Managed    Authorization Time Period 20% CO-INSURANCE, DED MET, 60 DAYS PER CALENDER YEAR AFTER 5TH VISIT, MEDICAL NESSITY NEEDED/REF#1980    PT Start Time 1331    PT Stop Time 1413    PT Time Calculation (min) 42 min    Activity Tolerance Patient tolerated treatment well    Behavior During Therapy WFL for tasks assessed/performed             Past Medical History:  Diagnosis Date   Bronchitis    Depression    Diabetes mellitus without complication ([place])    Gallbladder polyp    GERD (gastroesophageal reflux disease)    Hypertension    Hypertensive disorder 03/05/2015   Liver tumor (benign)    Sacrum and coccyx fracture ([place])    Sciatica     Past Surgical History:  Procedure Laterality Date   APPENDECTOMY     NASAL SEPTUM SURGERY     WISDOM TOOTH EXTRACTION      There were no vitals filed for this visit.   Subjective Assessment - 02/01/21 1332     Subjective Pt reports PT is going well. Has had cough lately. Pain reported at 1/10 NPS.    Pertinent History Tina Stephenson is a 76yoF referred by Dwana Melena, PA s/p right rotator cuff repair on 10/14/2020 c Dr. Erlinda Hong at [facility] s/p full-thickness tear of Supraspinatus. Pt also has Left supraspinatus full thickness tear, operative management awaiting full recovery from Rt surgery. She was immobilized post procedure, then on 11/17 allowed to DC the shoulder  sling.    Patient Stated Goals To move dominant arm, no pain, full range, return to work    Currently in Pain? Yes    Pain Score 1     Pain Location Shoulder    Pain Orientation Right    Pain Descriptors / Indicators Sore;Tightness;Sharp    Pain Type Surgical pain             There.ex:   Reassessment of goals. See clinical impression and goals section  Education provided on ways to stretch R upper trap at home to decreased R sided neck pain/tension  Standing CCW/CW overhead circles with green ball on wall: 3x30 sec/way for rhythmic stabilization and endurance for overhead ADL completion. Cuing for form/technique with enlarging circles for improve rotator cuff activation.   Standing alternating punches for shoulder protraction strength: 1x20/UE with 1# DB. 1x20/UE with 2# DB  Standing shoulder ER with elevation on wall with YTB: 2x8. PT demo with form/technique. Reports as challenging    Manual Therapy:   Supine STM to cervical paraspinals and R upper trap for 10 min for improved shoulder mobility   R upper trap stretch: 3x30 sec with overpressure at L shoulder girdle    PT Education - 02/01/21 1334     Education Details Progress towards goals, form/technique with exercise  Person(s) Educated Patient    Methods Explanation;Demonstration;Tactile cues;Verbal cues    Comprehension Verbalized understanding;Returned demonstration              PT Short Term Goals - 01/06/21 1219       PT SHORT TERM GOAL #1   Title FOTO improved to 35%    Baseline Unable to access FOTO information and retrieve questionnaire for pt to fill out. (11/22/2020)    Time 4    Period Weeks    Status On-going    Target Date 11/23/20      PT SHORT TERM GOAL #2   Title right shoulder PROM flexion & abduction 100*    Baseline Supine AAROM R shoulder 115 degrees flexion, 91 degrees abduction (11/22/2020); supine AAROM R shoulder flexion 120 degrees, abduction 97 degrees (12/22/2020); 131 degrees  flexion AAROM, 100 degrees abduction (01/06/2021)    Time 4    Period Weeks    Status Achieved    Target Date 11/23/20      PT SHORT TERM GOAL #3   Title Patient is independent with initial HEP; No questions after reviewed and redemonstrated today (11/22/2020)    Time 4    Period Weeks    Status Achieved    Target Date 11/23/20               PT Long Term Goals - 02/01/21 1335       PT LONG TERM GOAL #1   Title Patient reports pain in right shoulder </= 2/10 with activities    Baseline 3/10 at most for the past 7 days (12/22/2020); 1-2/10 at worst, occasional 3/10 (01/06/2021); 02/01/21- 3/10 NPS    Time 8    Period Weeks    Status Partially Met    Target Date 03/03/21      PT LONG TERM GOAL #2   Title FOTO >/= 55%    Time 10    Period Weeks    Status Unable to assess    Target Date 01/07/21      PT LONG TERM GOAL #3   Title Right shoulder AROM Flexion & abduction 120* and external & internal rotation 60*    Baseline R shoulder AROM flexion 73 degrees (improved to 95 degrees after session), abduction 55 degrees (improved to 72 degrees after session), ER 26 degrees, IR 37 degrees (rotation ROM at 80-90 degrees abduction position) (12/22/2020); 83 degrees flexion, 61 degrees abduction, 42 degrees ER and IR at 90 degrees abduction (01/06/2021); flexion 128 deg, abduction 95 deg, ER 51 deg, IR 90 deg    Time 8    Period Weeks    Status On-going    Target Date 03/03/21      PT LONG TERM GOAL #4   Title Right shoulder strength >/= 4/5    Baseline R shoulder strength about 3-/5 secondary to limited AROM. (12/22/2020); At available range: ER 4/5, IR 4/5, flexion and abduction 3-/5 (01/06/2021); 02/01/21: ER 4/5 and IR 5/5, Shoulder flexion 4/5, shoulder abduction: 4+/5    Time 8    Period Weeks    Status Achieved    Target Date 02/01/21                   Plan - 02/01/21 1438     Clinical Impression Statement Pt progressing towards long term goals. Pt  accomplishing all long term goals except for R shoulder ER and abduction AROM and still display muscle weakness in R shoulder ER. Pt tolerating STM to  R cervical musculature to improve pain and R shoulder AROM without aggravation of symptoms with education on home management. Pt still demoing weakness in overhead positions thus limiting overhead ADL completion without need for rest per subjective support. Remainder of session with focus on strength/endurance in overhead positions in hopes to improve overhead ADL completion. Pt will continue to benefit from skilled PT services to progres shoudler mobility and strength for completion of overhead ADL's.    Personal Factors and Comorbidities Comorbidity 3+    Comorbidities DM2, cervical herniated disc, scoliosis, obesity, HTN,    Examination-Activity Limitations Lift;Carry;Reach Overhead;Sleep    Examination-Participation Restrictions Occupation;Yard Work    Stability/Clinical Decision Making Stable/Uncomplicated    Rehab Potential Good    PT Frequency 2x / week    PT Duration 8 weeks    PT Treatment/Interventions ADLs/Self Care Home Management;Cryotherapy;Electrical Stimulation;Moist Heat;Functional mobility training;Therapeutic activities;Therapeutic exercise;Balance training;Neuromuscular re-education;Patient/family education;Manual techniques;Scar mobilization;Passive range of motion;Vasopneumatic Device;Joint Manipulations    PT Next Visit Plan HEP, PROM & manual therapy to increase ROM; check measurements    PT Home Exercise Plan Access Code: [email or web address]; Medbridge Access Code [email or web address]    Consulted and Agree with Plan of Care Patient             Patient will benefit from skilled therapeutic intervention in order to improve the following deficits and impairments:  Decreased coordination, Decreased endurance, Decreased range of motion, Decreased strength, Increased edema, Increased muscle spasms, Postural dysfunction, Obesity, Pain  Visit  Diagnosis: Muscle weakness (generalized)  Acute pain of right shoulder  Stiffness of right shoulder, not elsewhere classified  Abnormal posture     Problem List Patient Active Problem List   Diagnosis Date Noted   Upper respiratory infection, acute 12/20/2020   Bruised toe 12/20/2020   Traumatic tear of supraspinatus tendon of right shoulder 09/24/2020   Tendinopathy of right biceps tendon 09/24/2020   Traumatic tear of supraspinatus tendon of left shoulder 09/24/2020   Leg swelling 08/02/2020   Dyslipidemia 01/05/2020   Cervical polyp 08/22/2018   Nocturia 08/22/2018   Intertrigo 08/22/2018   Type 2 diabetes mellitus without complication, without long-term current use of insulin ([place]) 08/22/2018   Cervical spine arthritis with nerve pain 06/25/2018   Herniated disc, cervical 06/25/2018   History of migraine 03/25/2018   Scoliosis 03/25/2018   History of cyst of breast 03/25/2018   Sacrum and coccyx fracture, sequela 02/21/2018   Class 3 severe obesity due to excess calories without serious comorbidity with body mass index (BMI) of 45.0 to 49.9 in adult [facility]) 02/21/2018   Cough 02/21/2018   Onychomycosis 02/21/2018   History of hypertension 02/21/2018   Vitamin D deficiency 03/19/2015   Essential hypertension 03/05/2015    Tina Stephenson. Fairly IV, PT, DPT Physical Therapist- [facility]  02/01/2021, 3:06 PM  [place] [facility] [address] [place], [address] Phone: [phone]   Fax:  [phone]  Name: Tina Stephenson MRN: [id number] Date of Birth: August 21, 1955

## 2021-02-03 ENCOUNTER — Other Ambulatory Visit: Payer: Self-pay

## 2021-02-03 ENCOUNTER — Other Ambulatory Visit: Payer: Self-pay | Admitting: Internal Medicine

## 2021-02-03 ENCOUNTER — Ambulatory Visit: Payer: Managed Care, Other (non HMO)

## 2021-02-03 DIAGNOSIS — I10 Essential (primary) hypertension: Secondary | ICD-10-CM

## 2021-02-03 DIAGNOSIS — M6281 Muscle weakness (generalized): Secondary | ICD-10-CM | POA: Diagnosis not present

## 2021-02-03 DIAGNOSIS — M25611 Stiffness of right shoulder, not elsewhere classified: Secondary | ICD-10-CM

## 2021-02-03 DIAGNOSIS — M25511 Pain in right shoulder: Secondary | ICD-10-CM

## 2021-02-03 DIAGNOSIS — R293 Abnormal posture: Secondary | ICD-10-CM

## 2021-02-03 NOTE — Telephone Encounter (Signed)
Requested Prescriptions  Pending Prescriptions Disp Refills   hydrochlorothiazide (HYDRODIURIL) 12.5 MG tablet [Pharmacy Med Name: HYDROCHLOROTHIAZIDE 12.5MG  TABLETS] 90 tablet 0    Sig: TAKE 1 TABLET(12.5 MG) BY MOUTH DAILY     Cardiovascular: Diuretics - Thiazide Passed - 02/03/2021  3:38 AM      Passed - Ca in normal range and within 360 days    Calcium  Date Value Ref Range Status  01/05/2021 10.1 8.7 - 10.3 mg/dL Final         Passed - Cr in normal range and within 360 days    Creatinine, Ser  Date Value Ref Range Status  01/05/2021 0.73 0.57 - 1.00 mg/dL Final         Passed - K in normal range and within 360 days    Potassium  Date Value Ref Range Status  01/05/2021 4.3 3.5 - 5.2 mmol/L Final         Passed - Na in normal range and within 360 days    Sodium  Date Value Ref Range Status  01/05/2021 140 134 - 144 mmol/L Final         Passed - Last BP in normal range    BP Readings from Last 1 Encounters:  12/20/20 124/66         Passed - Valid encounter within last 6 months    Recent Outpatient Visits          1 month ago Upper respiratory infection, acute   [facility] [place], Dani Gobble, PA-C   3 months ago Essential hypertension   [place], DO   4 months ago Essential hypertension   [facility] Delsa Grana, PA-C   5 months ago Essential hypertension   [place], DO   6 months ago Leg swelling   [facility] Myles Gip, DO      Future Appointments            In 3 months Delsa Grana, PA-C [facility], [facility]

## 2021-02-03 NOTE — Therapy (Signed)
[place] [facility] [address], [place], [address] Phone: [phone]   Fax:  [phone]  Physical Therapy Treatment  Patient Details  Name: Tina Stephenson MRN: [id number] Date of Birth: 1955-01-28 Referring Provider (PT): Dwana Melena, [place]   Encounter Date: 02/03/2021   PT End of Session - 02/03/21 1335     Visit Number 31    Number of Visits 40    Date for PT Re-Evaluation 03/03/21    Authorization Type [employer] Managed    Authorization Time Period 20% CO-INSURANCE, DED MET, 60 DAYS PER CALENDER YEAR AFTER 5TH VISIT, MEDICAL NESSITY NEEDED/REF#1980    PT Start Time 1330    PT Stop Time 1412    PT Time Calculation (min) 42 min    Activity Tolerance Patient tolerated treatment well    Behavior During Therapy WFL for tasks assessed/performed             Past Medical History:  Diagnosis Date   Bronchitis    Depression    Diabetes mellitus without complication ([place])    Gallbladder polyp    GERD (gastroesophageal reflux disease)    Hypertension    Hypertensive disorder 03/05/2015   Liver tumor (benign)    Sacrum and coccyx fracture ([place])    Sciatica     Past Surgical History:  Procedure Laterality Date   APPENDECTOMY     NASAL SEPTUM SURGERY     WISDOM TOOTH EXTRACTION      There were no vitals filed for this visit.   Subjective Assessment - 02/03/21 1332     Subjective Pt reports subtle pain today. Reports pain increased sharply after last session up to 5/10 NPS.    Pertinent History Tina Stephenson is a 58yoF referred by Dwana Melena, PA s/p right rotator cuff repair on 10/14/2020 c Dr. Erlinda Hong at [facility] s/p full-thickness tear of Supraspinatus. Pt also has Left supraspinatus full thickness tear, operative management awaiting full recovery from Rt surgery. She was immobilized post procedure, then on 11/17 allowed to DC the shoulder sling.    Patient Stated Goals To move dominant arm, no pain, full range,  return to work    Currently in Pain? Yes    Pain Score 1               There.ex:   Pulley's: abd/scaption/flexion 3x10/direction, cuing for form/technique, decreasing speed for warm up  Standing RUE elevation with ball on wall with 5 sec holds at top of AROM for overhead strengthening: x12   Hook lying alternating punches against gravity for protraction and shoulder adduction strength: x20 bilat. 2x15 with 2# DB  With elbow extended, x15 serratus punch for upward rotation strengthening for overhead mobility. Progressed to 2x15 with 2#DB  Seated R shoulder ER with YTB and towel at torso: 3x8  Seated R shoulder IR with YTB: 2x20 without towel. Mod TC's at elbow for form/technique   Standing scap retraction with RTB: 2x20, excellent form/technique with exercise     Manual Therapy:   STM for 8 min with pt in sitting to R supraspinatus and R UT due to reports of 4/10 pain NPS in R shoulder after resisted shoulder ER. Pt reports return to 1/10 NPS post STM.    PT Education - 02/03/21 1333     Education Details form/technique with exercise.    Person(s) Educated Patient    Methods Explanation;Demonstration;Tactile cues;Verbal cues    Comprehension Verbalized understanding;Returned demonstration  PT Short Term Goals - 01/06/21 1219       PT SHORT TERM GOAL #1   Title FOTO improved to 35%    Baseline Unable to access FOTO information and retrieve questionnaire for pt to fill out. (11/22/2020)    Time 4    Period Weeks    Status On-going    Target Date 11/23/20      PT SHORT TERM GOAL #2   Title right shoulder PROM flexion & abduction 100*    Baseline Supine AAROM R shoulder 115 degrees flexion, 91 degrees abduction (11/22/2020); supine AAROM R shoulder flexion 120 degrees, abduction 97 degrees (12/22/2020); 131 degrees flexion AAROM, 100 degrees abduction (01/06/2021)    Time 4    Period Weeks    Status Achieved    Target Date 11/23/20      PT SHORT  TERM GOAL #3   Title Patient is independent with initial HEP; No questions after reviewed and redemonstrated today (11/22/2020)    Time 4    Period Weeks    Status Achieved    Target Date 11/23/20               PT Long Term Goals - 02/01/21 1335       PT LONG TERM GOAL #1   Title Patient reports pain in right shoulder </= 2/10 with activities    Baseline 3/10 at most for the past 7 days (12/22/2020); 1-2/10 at worst, occasional 3/10 (01/06/2021); 02/01/21- 3/10 NPS    Time 8    Period Weeks    Status Partially Met    Target Date 03/03/21      PT LONG TERM GOAL #2   Title FOTO >/= 55%    Time 10    Period Weeks    Status Unable to assess    Target Date 01/07/21      PT LONG TERM GOAL #3   Title Right shoulder AROM Flexion & abduction 120* and external & internal rotation 60*    Baseline R shoulder AROM flexion 73 degrees (improved to 95 degrees after session), abduction 55 degrees (improved to 72 degrees after session), ER 26 degrees, IR 37 degrees (rotation ROM at 80-90 degrees abduction position) (12/22/2020); 83 degrees flexion, 61 degrees abduction, 42 degrees ER and IR at 90 degrees abduction (01/06/2021); flexion 128 deg, abduction 95 deg, ER 51 deg, IR 90 deg    Time 8    Period Weeks    Status On-going    Target Date 03/03/21      PT LONG TERM GOAL #4   Title Right shoulder strength >/= 4/5    Baseline R shoulder strength about 3-/5 secondary to limited AROM. (12/22/2020); At available range: ER 4/5, IR 4/5, flexion and abduction 3-/5 (01/06/2021); 02/01/21: ER 4/5 and IR 5/5, Shoulder flexion 4/5, shoulder abduction: 4+/5    Time 8    Period Weeks    Status Achieved    Target Date 02/01/21                   Plan - 02/03/21 1418     Clinical Impression Statement Gentle overhead and RTC strengthening exercises performed today due to reports of increased pain after last session. Pt does display increased pain and tension in supraspinatus after resisted  shoulder ER today. Able to utilize STM techniques to supraspinatus to reduce pain to baseline levels to 1/10 NPS. Pt educated on benefits of modalities after strengthening to reduce soreness/pain with progressive loading with pt  verbalizing understanding. Pt will continue to benefit from skilled PT services to progress shoulder mobility and strength for completion of overhead ADL's.    Personal Factors and Comorbidities Comorbidity 3+    Comorbidities DM2, cervical herniated disc, scoliosis, obesity, HTN,    Examination-Activity Limitations Lift;Carry;Reach Overhead;Sleep    Examination-Participation Restrictions Occupation;Yard Work    Stability/Clinical Decision Making Stable/Uncomplicated    Rehab Potential Good    PT Frequency 2x / week    PT Duration 8 weeks    PT Treatment/Interventions ADLs/Self Care Home Management;Cryotherapy;Electrical Stimulation;Moist Heat;Functional mobility training;Therapeutic activities;Therapeutic exercise;Balance training;Neuromuscular re-education;Patient/family education;Manual techniques;Scar mobilization;Passive range of motion;Vasopneumatic Device;Joint Manipulations    PT Next Visit Plan HEP, PROM & manual therapy to increase ROM; check measurements    PT Home Exercise Plan Access Code: [email or web address]; Medbridge Access Code [email or web address]    Consulted and Agree with Plan of Care Patient             Patient will benefit from skilled therapeutic intervention in order to improve the following deficits and impairments:  Decreased coordination, Decreased endurance, Decreased range of motion, Decreased strength, Increased edema, Increased muscle spasms, Postural dysfunction, Obesity, Pain  Visit Diagnosis: Muscle weakness (generalized)  Acute pain of right shoulder  Stiffness of right shoulder, not elsewhere classified  Abnormal posture     Problem List Patient Active Problem List   Diagnosis Date Noted   Upper respiratory infection, acute 12/20/2020    Bruised toe 12/20/2020   Traumatic tear of supraspinatus tendon of right shoulder 09/24/2020   Tendinopathy of right biceps tendon 09/24/2020   Traumatic tear of supraspinatus tendon of left shoulder 09/24/2020   Leg swelling 08/02/2020   Dyslipidemia 01/05/2020   Cervical polyp 08/22/2018   Nocturia 08/22/2018   Intertrigo 08/22/2018   Type 2 diabetes mellitus without complication, without long-term current use of insulin ([place]) 08/22/2018   Cervical spine arthritis with nerve pain 06/25/2018   Herniated disc, cervical 06/25/2018   History of migraine 03/25/2018   Scoliosis 03/25/2018   History of cyst of breast 03/25/2018   Sacrum and coccyx fracture, sequela 02/21/2018   Class 3 severe obesity due to excess calories without serious comorbidity with body mass index (BMI) of 45.0 to 49.9 in adult [facility]) 02/21/2018   Cough 02/21/2018   Onychomycosis 02/21/2018   History of hypertension 02/21/2018   Vitamin D deficiency 03/19/2015   Essential hypertension 03/05/2015    [place]em Caster. Fairly IV, PT, DPT Physical Therapist- [facility]  02/03/2021, 2:22 PM  [place] [facility] [address], [place], [address] Phone: [phone]   Fax:  [phone]  Name: Tina Stephenson MRN: [id number] Date of Birth: 1955/09/15

## 2021-02-08 ENCOUNTER — Ambulatory Visit: Payer: Managed Care, Other (non HMO)

## 2021-02-08 ENCOUNTER — Other Ambulatory Visit: Payer: Self-pay

## 2021-02-08 DIAGNOSIS — M6281 Muscle weakness (generalized): Secondary | ICD-10-CM | POA: Diagnosis not present

## 2021-02-08 DIAGNOSIS — M25511 Pain in right shoulder: Secondary | ICD-10-CM

## 2021-02-08 DIAGNOSIS — M25611 Stiffness of right shoulder, not elsewhere classified: Secondary | ICD-10-CM

## 2021-02-08 NOTE — Therapy (Signed)
[facility] [address], [place], [address] Phone: [phone]   Fax:  [phone]  Physical Therapy Treatment  Patient Details  Name: Tina Stephenson MRN: [id number] Date of Birth: 04/15/1955 Referring Provider (PT): Dwana Melena, [place]   Encounter Date: 02/08/2021   PT End of Session - 02/08/21 1335     Visit Number 32    Number of Visits 40    Date for PT Re-Evaluation 03/03/21    Authorization Type [employer] Managed    Authorization Time Period 20% CO-INSURANCE, DED MET, 60 DAYS PER CALENDER YEAR AFTER 5TH VISIT, MEDICAL NESSITY NEEDED/REF#1980    PT Start Time 63    PT Stop Time 1418    PT Time Calculation (min) 43 min    Activity Tolerance Patient tolerated treatment well    Behavior During Therapy WFL for tasks assessed/performed             Past Medical History:  Diagnosis Date   Bronchitis    Depression    Diabetes mellitus without complication ([place])    Gallbladder polyp    GERD (gastroesophageal reflux disease)    Hypertension    Hypertensive disorder 03/05/2015   Liver tumor (benign)    Sacrum and coccyx fracture ([place])    Sciatica     Past Surgical History:  Procedure Laterality Date   APPENDECTOMY     NASAL SEPTUM SURGERY     Stephenson TOOTH EXTRACTION      There were no vitals filed for this visit.   Subjective Assessment - 02/08/21 1337     Subjective R shoulder is a 0-1/10 currently except when her alarm clock goes off and she moves her arm quickly.    Pertinent History Lourdes Kucharski is a 21yoF referred by Dwana Melena, PA s/p right rotator cuff repair on 10/14/2020 c Dr. Erlinda Hong at [facility] s/p full-thickness tear of Supraspinatus. Pt also has Left supraspinatus full thickness tear, operative management awaiting full recovery from Rt surgery. She was immobilized post procedure, then on 11/17 allowed to DC the shoulder sling.    Patient Stated Goals To move dominant arm, no pain, full  range, return to work    Currently in Pain? Yes    Pain Score 1                                       Pt states having GERD    PT Education - 02/08/21 1851     Education Details ther-ex    Person(s) Educated Patient    Methods Explanation;Demonstration;Tactile cues;Verbal cues    Comprehension Returned demonstration;Verbalized understanding           Therapeutic exercises   Setaed R shoulder AAROM with PT with 5 second holds  Flexion 10x  Scaption 10x  Abduction 10x   Standing Yellow T-band, neutral arm             ER 8x, 10x2               IR 20x2   Scapular rows red band 10x5 seconds  Then green band 10x5 seconds for 2 sets  Ball roll up the wall 5x5 seconds  Standing triceps extension red band R 5x2  Seated manually resisted R scapular depression isometrics, PT manually resisted 10x5 seconds for 3 sets  Standing B shoulder extension with scapular retraction 10x5 seconds for 3 sets  red band  Standing R shoulder flexion AROM 5x  Scaption 5x   Improved exercise technique, movement at target joints, use of target muscles after mod verbal, visual, tactile cues.      Manual Therapy:               Seated STM R upper trap muscle to decrease tension     Response to treatment Pt tolerated session well without aggravation of symptoms.      Clinical impression. Improving overall ability to raise her R arm up against gravity observed. Continued working on improving R shoulder and scapular strength to promote AROM and better mechanics when reaching. Pt tolerated session well without aggravation of symptoms. Pt will benefit from continued skilled physical therapy services to decrease pain, improve strength and function.        PT Short Term Goals - 01/06/21 1219       PT SHORT TERM GOAL #1   Title FOTO improved to 35%    Baseline Unable to access FOTO information and retrieve questionnaire for pt to fill out. (11/22/2020)     Time 4    Period Weeks    Status On-going    Target Date 11/23/20      PT SHORT TERM GOAL #2   Title right shoulder PROM flexion & abduction 100*    Baseline Supine AAROM R shoulder 115 degrees flexion, 91 degrees abduction (11/22/2020); supine AAROM R shoulder flexion 120 degrees, abduction 97 degrees (12/22/2020); 131 degrees flexion AAROM, 100 degrees abduction (01/06/2021)    Time 4    Period Weeks    Status Achieved    Target Date 11/23/20      PT SHORT TERM GOAL #3   Title Patient is independent with initial HEP; No questions after reviewed and redemonstrated today (11/22/2020)    Time 4    Period Weeks    Status Achieved    Target Date 11/23/20               PT Long Term Goals - 02/01/21 1335       PT LONG TERM GOAL #1   Title Patient reports pain in right shoulder </= 2/10 with activities    Baseline 3/10 at most for the past 7 days (12/22/2020); 1-2/10 at worst, occasional 3/10 (01/06/2021); 02/01/21- 3/10 NPS    Time 8    Period Weeks    Status Partially Met    Target Date 03/03/21      PT LONG TERM GOAL #2   Title FOTO >/= 55%    Time 10    Period Weeks    Status Unable to assess    Target Date 01/07/21      PT LONG TERM GOAL #3   Title Right shoulder AROM Flexion & abduction 120* and external & internal rotation 60*    Baseline R shoulder AROM flexion 73 degrees (improved to 95 degrees after session), abduction 55 degrees (improved to 72 degrees after session), ER 26 degrees, IR 37 degrees (rotation ROM at 80-90 degrees abduction position) (12/22/2020); 83 degrees flexion, 61 degrees abduction, 42 degrees ER and IR at 90 degrees abduction (01/06/2021); flexion 128 deg, abduction 95 deg, ER 51 deg, IR 90 deg    Time 8    Period Weeks    Status On-going    Target Date 03/03/21      PT LONG TERM GOAL #4   Title Right shoulder strength >/= 4/5    Baseline R shoulder strength about 3-/5  secondary to limited AROM. (12/22/2020); At available range: ER  4/5, IR 4/5, flexion and abduction 3-/5 (01/06/2021); 02/01/21: ER 4/5 and IR 5/5, Shoulder flexion 4/5, shoulder abduction: 4+/5    Time 8    Period Weeks    Status Achieved    Target Date 02/01/21                   Plan - 02/08/21 1852     Clinical Impression Statement Improving overall ability to raise her R arm up against gravity observed. Continued working on improving R shoulder and scapular strength to promote AROM and better mechanics when reaching. Pt tolerated session well without aggravation of symptoms. Pt will benefit from continued skilled physical therapy services to decrease pain, improve strength and function.    Personal Factors and Comorbidities Comorbidity 3+    Comorbidities DM2, cervical herniated disc, scoliosis, obesity, HTN,    Examination-Activity Limitations Lift;Carry;Reach Overhead;Sleep    Examination-Participation Restrictions Occupation;Yard Work    Stability/Clinical Decision Making Stable/Uncomplicated    Rehab Potential Good    PT Frequency 2x / week    PT Duration 8 weeks    PT Treatment/Interventions ADLs/Self Care Home Management;Cryotherapy;Electrical Stimulation;Moist Heat;Functional mobility training;Therapeutic activities;Therapeutic exercise;Balance training;Neuromuscular re-education;Patient/family education;Manual techniques;Scar mobilization;Passive range of motion;Vasopneumatic Device;Joint Manipulations    PT Next Visit Plan HEP, PROM & manual therapy to increase ROM; check measurements    PT Home Exercise Plan Access Code: [email or web address]; Medbridge Access Code [email or web address]    Consulted and Agree with Plan of Care Patient             Patient will benefit from skilled therapeutic intervention in order to improve the following deficits and impairments:  Decreased coordination, Decreased endurance, Decreased range of motion, Decreased strength, Increased edema, Increased muscle spasms, Postural dysfunction, Obesity, Pain  Visit  Diagnosis: Muscle weakness (generalized)  Acute pain of right shoulder  Stiffness of right shoulder, not elsewhere classified     Problem List Patient Active Problem List   Diagnosis Date Noted   Upper respiratory infection, acute 12/20/2020   Bruised toe 12/20/2020   Traumatic tear of supraspinatus tendon of right shoulder 09/24/2020   Tendinopathy of right biceps tendon 09/24/2020   Traumatic tear of supraspinatus tendon of left shoulder 09/24/2020   Leg swelling 08/02/2020   Dyslipidemia 01/05/2020   Cervical polyp 08/22/2018   Nocturia 08/22/2018   Intertrigo 08/22/2018   Type 2 diabetes mellitus without complication, without long-term current use of insulin ([place]) 08/22/2018   Cervical spine arthritis with nerve pain 06/25/2018   Herniated disc, cervical 06/25/2018   History of migraine 03/25/2018   Scoliosis 03/25/2018   History of cyst of breast 03/25/2018   Sacrum and coccyx fracture, sequela 02/21/2018   Class 3 severe obesity due to excess calories without serious comorbidity with body mass index (BMI) of 45.0 to 49.9 in adult [facility]) 02/21/2018   Cough 02/21/2018   Onychomycosis 02/21/2018   History of hypertension 02/21/2018   Vitamin D deficiency 03/19/2015   Essential hypertension 03/05/2015    Joneen Boers PT, DPT  02/08/2021, 6:57 PM  [facility] [address] [place], [address] Phone: [phone]   Fax:  [phone]  Name: Christine Morton MRN: [id number] Date of Birth: Nov 19, 1955

## 2021-02-10 ENCOUNTER — Ambulatory Visit: Payer: Managed Care, Other (non HMO) | Attending: Physician Assistant

## 2021-02-10 ENCOUNTER — Other Ambulatory Visit: Payer: Self-pay

## 2021-02-10 DIAGNOSIS — R293 Abnormal posture: Secondary | ICD-10-CM | POA: Insufficient documentation

## 2021-02-10 DIAGNOSIS — M25511 Pain in right shoulder: Secondary | ICD-10-CM | POA: Insufficient documentation

## 2021-02-10 DIAGNOSIS — M25611 Stiffness of right shoulder, not elsewhere classified: Secondary | ICD-10-CM | POA: Insufficient documentation

## 2021-02-10 DIAGNOSIS — M6281 Muscle weakness (generalized): Secondary | ICD-10-CM | POA: Diagnosis not present

## 2021-02-10 NOTE — Therapy (Signed)
[facility] [address] [place], [address] Phone: [phone]   Fax:  [phone]  Physical Therapy Treatment  Patient Details  Name: Tina Stephenson MRN: [id number] Date of Birth: 06-23-1955 Referring Provider (PT): Dwana Melena, [place]   Encounter Date: 02/10/2021   PT End of Session - 02/10/21 0756     Visit Number 33    Number of Visits 40    Date for PT Re-Evaluation 03/03/21    Authorization Type [employer] Managed    Authorization Time Period 20% CO-INSURANCE, DED MET, 60 DAYS PER CALENDER YEAR AFTER 5TH VISIT, MEDICAL NESSITY NEEDED/REF#1980    PT Start Time 0800    PT Stop Time 0844    PT Time Calculation (min) 44 min    Activity Tolerance Patient tolerated treatment well    Behavior During Therapy [facility] for tasks assessed/performed             Past Medical History:  Diagnosis Date   Bronchitis    Depression    Diabetes mellitus without complication ([place])    Gallbladder polyp    GERD (gastroesophageal reflux disease)    Hypertension    Hypertensive disorder 03/05/2015   Liver tumor (benign)    Sacrum and coccyx fracture ([place])    Sciatica     Past Surgical History:  Procedure Laterality Date   APPENDECTOMY     NASAL SEPTUM SURGERY     WISDOM TOOTH EXTRACTION      There were no vitals filed for this visit.   Subjective Assessment - 02/10/21 0801     Subjective Pt does endorse some pain and soreness. 2/10 NPS. Otherwise no new concerns.    Pertinent History Tina Stephenson is a 45yoF referred by Dwana Melena, PA s/p right rotator cuff repair on 10/14/2020 c Dr. Erlinda Hong at [facility] s/p full-thickness tear of Supraspinatus. Pt also has Left supraspinatus full thickness tear, operative management awaiting full recovery from Rt surgery. She was immobilized post procedure, then on 11/17 allowed to DC the shoulder sling.    Patient Stated Goals To move dominant arm, no pain, full range, return to work     Currently in Pain? Yes    Pain Score 2     Pain Location Shoulder    Pain Orientation Right    Pain Descriptors / Indicators Sore;Sharp    Pain Type Surgical pain              Manual Therapy: 25 minutes with pt in supine  Grade 2 AP mobs to GHJ for pain modulation: 5x15 sec bouts   Grade 2 inferior mobs to GHJ for pain modulation: 5x15 sec bouts   STM for 10 min to R upper trap and supraspinatus for pain modulation and comfort for overhead activities  STM for 5 min in sitting to R bicep and brachialis for pain modulation and comfort for overhead activities    There.ex:   Seated  B shoulder flexion to 90 deg: x20 with no resistance. X20 with 1# DB. Good form/technique.   Standing YTB R shoulder IR/ER: 2x20/direction. Good form/technique   Standing scap retractions with RTB: 2x20   Standing low row/shoulder extension with RTB: 2x20   Seated R bicep stretch with elbow and wrist extension: 3x30 sec.    Pt educated on modalities, bicep stretch, STM to improve bicep symptoms.       PT Education - 02/10/21 0756     Education Details form/technique with exercise  Person(s) Educated Patient   ° Methods Explanation;Demonstration;Tactile cues;Verbal cues   ° Comprehension Verbalized understanding;Returned demonstration   ° °  °  ° °  ° ° ° PT Short Term Goals - 01/06/21 1219   ° °  ° PT SHORT TERM GOAL #1  ° Title FOTO improved to 35%   ° Baseline Unable to access FOTO information and retrieve questionnaire for pt to fill out. (11/22/2020)   ° Time 4   ° Period Weeks   ° Status On-going   ° Target Date 11/23/20   °  ° PT SHORT TERM GOAL #2  ° Title right shoulder PROM flexion & abduction 100*   ° Baseline Supine AAROM R shoulder 115 degrees flexion, 91 degrees abduction (11/22/2020); supine AAROM R shoulder flexion 120 degrees, abduction 97 degrees (12/22/2020); 131 degrees flexion AAROM, 100 degrees abduction (01/06/2021)   ° Time 4   ° Period Weeks   ° Status Achieved   ° Target Date  11/23/20   °  ° PT SHORT TERM GOAL #3  ° Title Patient is independent with initial HEP; No questions after reviewed and redemonstrated today (11/22/2020)   ° Time 4   ° Period Weeks   ° Status Achieved   ° Target Date 11/23/20   ° °  °  ° °  ° ° ° ° PT Long Term Goals - 02/01/21 1335   ° °  ° PT LONG TERM GOAL #1  ° Title Patient reports pain in right shoulder </= 2/10 with activities   ° Baseline 3/10 at most for the past 7 days (12/22/2020); 1-2/10 at worst, occasional 3/10 (01/06/2021); 02/01/21- 3/10 NPS   ° Time 8   ° Period Weeks   ° Status Partially Met   ° Target Date 03/03/21   °  ° PT LONG TERM GOAL #2  ° Title FOTO >/= 55%   ° Time 10   ° Period Weeks   ° Status Unable to assess   ° Target Date 01/07/21   °  ° PT LONG TERM GOAL #3  ° Title Right shoulder AROM Flexion & abduction 120* and external & internal rotation 60*   ° Baseline R shoulder AROM flexion 73 degrees (improved to 95 degrees after session), abduction 55 degrees (improved to 72 degrees after session), ER 26 degrees, IR 37 degrees (rotation ROM at 80-90 degrees abduction position) (12/22/2020); 83 degrees flexion, 61 degrees abduction, 42 degrees ER and IR at 90 degrees abduction (01/06/2021); flexion 128 deg, abduction 95 deg, ER 51 deg, IR 90 deg   ° Time 8   ° Period Weeks   ° Status On-going   ° Target Date 03/03/21   °  ° PT LONG TERM GOAL #4  ° Title Right shoulder strength >/= 4/5   ° Baseline R shoulder strength about 3-/5 secondary to limited AROM. (12/22/2020); At available range: ER 4/5, IR 4/5, flexion and abduction 3-/5 (01/06/2021); 02/01/21: ER 4/5 and IR 5/5, Shoulder flexion 4/5, shoulder abduction: 4+/5   ° Time 8   ° Period Weeks   ° Status Achieved   ° Target Date 02/01/21   ° °  °  ° °  ° ° ° ° ° ° ° ° Plan - 02/10/21 0913   ° ° Clinical Impression Statement Heavy focus on manual techniques due to mild pain and soreness in shoulder and biceps muscle belly on R side. Pt endorsing improvement in biceps and shoulder pain  after manual intervention. Continuing to   focus on light strengthening in overhead positions and rotator cuff strengthening. Pt educated on use of modalities, STM, and strethcing for bicep symptoms if they persist. Pt will continue to benefit from skilled PT services to decrease pain and improve strength/function with overhead activities.   ° Personal Factors and Comorbidities Comorbidity 3+   ° Comorbidities DM2, cervical herniated disc, scoliosis, obesity, HTN,   ° Examination-Activity Limitations Lift;Carry;Reach Overhead;Sleep   ° Examination-Participation Restrictions Occupation;Yard Work   ° Stability/Clinical Decision Making Stable/Uncomplicated   ° Rehab Potential Good   ° PT Frequency 2x / week   ° PT Duration 8 weeks   ° PT Treatment/Interventions ADLs/Self Care Home Management;Cryotherapy;Electrical Stimulation;Moist Heat;Functional mobility training;Therapeutic activities;Therapeutic exercise;Balance training;Neuromuscular re-education;Patient/family education;Manual techniques;Scar mobilization;Passive range of motion;Vasopneumatic Device;Joint Manipulations   ° PT Next Visit Plan HEP, PROM & manual therapy to increase ROM; check measurements   ° PT Home Exercise Plan Access Code: [email or web address]; Medbridge Access Code [email or web address]   ° Consulted and Agree with Plan of Care Patient   ° °  °  ° °  ° ° °Patient will benefit from skilled therapeutic intervention in order to improve the following deficits and impairments:  Decreased coordination, Decreased endurance, Decreased range of motion, Decreased strength, Increased edema, Increased muscle spasms, Postural dysfunction, Obesity, Pain ° °Visit Diagnosis: °Muscle weakness (generalized) ° °Acute pain of right shoulder ° °Stiffness of right shoulder, not elsewhere classified ° °Abnormal posture ° ° ° ° °Problem List °Patient Active Problem List  ° Diagnosis Date Noted  ° Upper respiratory infection, acute 12/20/2020  ° Bruised toe 12/20/2020  ° Traumatic tear of  supraspinatus tendon of right shoulder 09/24/2020  ° Tendinopathy of right biceps tendon 09/24/2020  ° Traumatic tear of supraspinatus tendon of left shoulder 09/24/2020  ° Leg swelling 08/02/2020  ° Dyslipidemia 01/05/2020  ° Cervical polyp 08/22/2018  ° Nocturia 08/22/2018  ° Intertrigo 08/22/2018  ° Type 2 diabetes mellitus without complication, without long-term current use of insulin ([facility]) 08/22/2018  ° Cervical spine arthritis with nerve pain 06/25/2018  ° Herniated disc, cervical 06/25/2018  ° History of migraine 03/25/2018  ° Scoliosis 03/25/2018  ° History of cyst of breast 03/25/2018  ° Sacrum and coccyx fracture, sequela 02/21/2018  ° Class 3 severe obesity due to excess calories without serious comorbidity with body mass index (BMI) of 45.0 to 49.9 in adult ([facility]) 02/21/2018  ° Cough 02/21/2018  ° Onychomycosis 02/21/2018  ° History of hypertension 02/21/2018  ° Vitamin D deficiency 03/19/2015  ° Essential hypertension 03/05/2015  ° ° °Milton M. Fairly IV, PT, DPT °Physical Therapist- [place]  °[facility]  °02/10/2021, 9:24 AM ° °[place] °[facility] °[address] °[place], [place], [address] °Phone: [phone]   Fax:  [phone] ° °Name: Decie Geng °MRN: [id number] °Date of Birth: 11/13/1955 ° ° ° °

## 2021-02-15 ENCOUNTER — Encounter: Payer: Self-pay | Admitting: Emergency Medicine

## 2021-02-15 ENCOUNTER — Emergency Department: Payer: Managed Care, Other (non HMO)

## 2021-02-15 ENCOUNTER — Ambulatory Visit: Payer: Managed Care, Other (non HMO)

## 2021-02-15 ENCOUNTER — Other Ambulatory Visit: Payer: Self-pay

## 2021-02-15 ENCOUNTER — Emergency Department
Admission: EM | Admit: 2021-02-15 | Discharge: 2021-02-15 | Disposition: A | Discharge status: Home / Self Care | Payer: Managed Care, Other (non HMO) | Attending: Emergency Medicine | Admitting: Emergency Medicine

## 2021-02-15 DIAGNOSIS — M25611 Stiffness of right shoulder, not elsewhere classified: Secondary | ICD-10-CM

## 2021-02-15 DIAGNOSIS — R519 Headache, unspecified: Secondary | ICD-10-CM | POA: Insufficient documentation

## 2021-02-15 DIAGNOSIS — M25511 Pain in right shoulder: Secondary | ICD-10-CM | POA: Insufficient documentation

## 2021-02-15 DIAGNOSIS — M542 Cervicalgia: Secondary | ICD-10-CM | POA: Insufficient documentation

## 2021-02-15 DIAGNOSIS — E119 Type 2 diabetes mellitus without complications: Secondary | ICD-10-CM | POA: Diagnosis not present

## 2021-02-15 DIAGNOSIS — Y92410 Unspecified street and highway as the place of occurrence of the external cause: Secondary | ICD-10-CM | POA: Diagnosis not present

## 2021-02-15 DIAGNOSIS — I10 Essential (primary) hypertension: Secondary | ICD-10-CM | POA: Insufficient documentation

## 2021-02-15 DIAGNOSIS — M6281 Muscle weakness (generalized): Secondary | ICD-10-CM

## 2021-02-15 IMAGING — DX DG SHOULDER 2+V*R*
3 series · 3 of 3 positions shown · non-contrast
Comparison: None.

CLINICAL DATA: Shoulder pain

EXAM:
RIGHT SHOULDER - 3 VIEW

[shoulder axial]
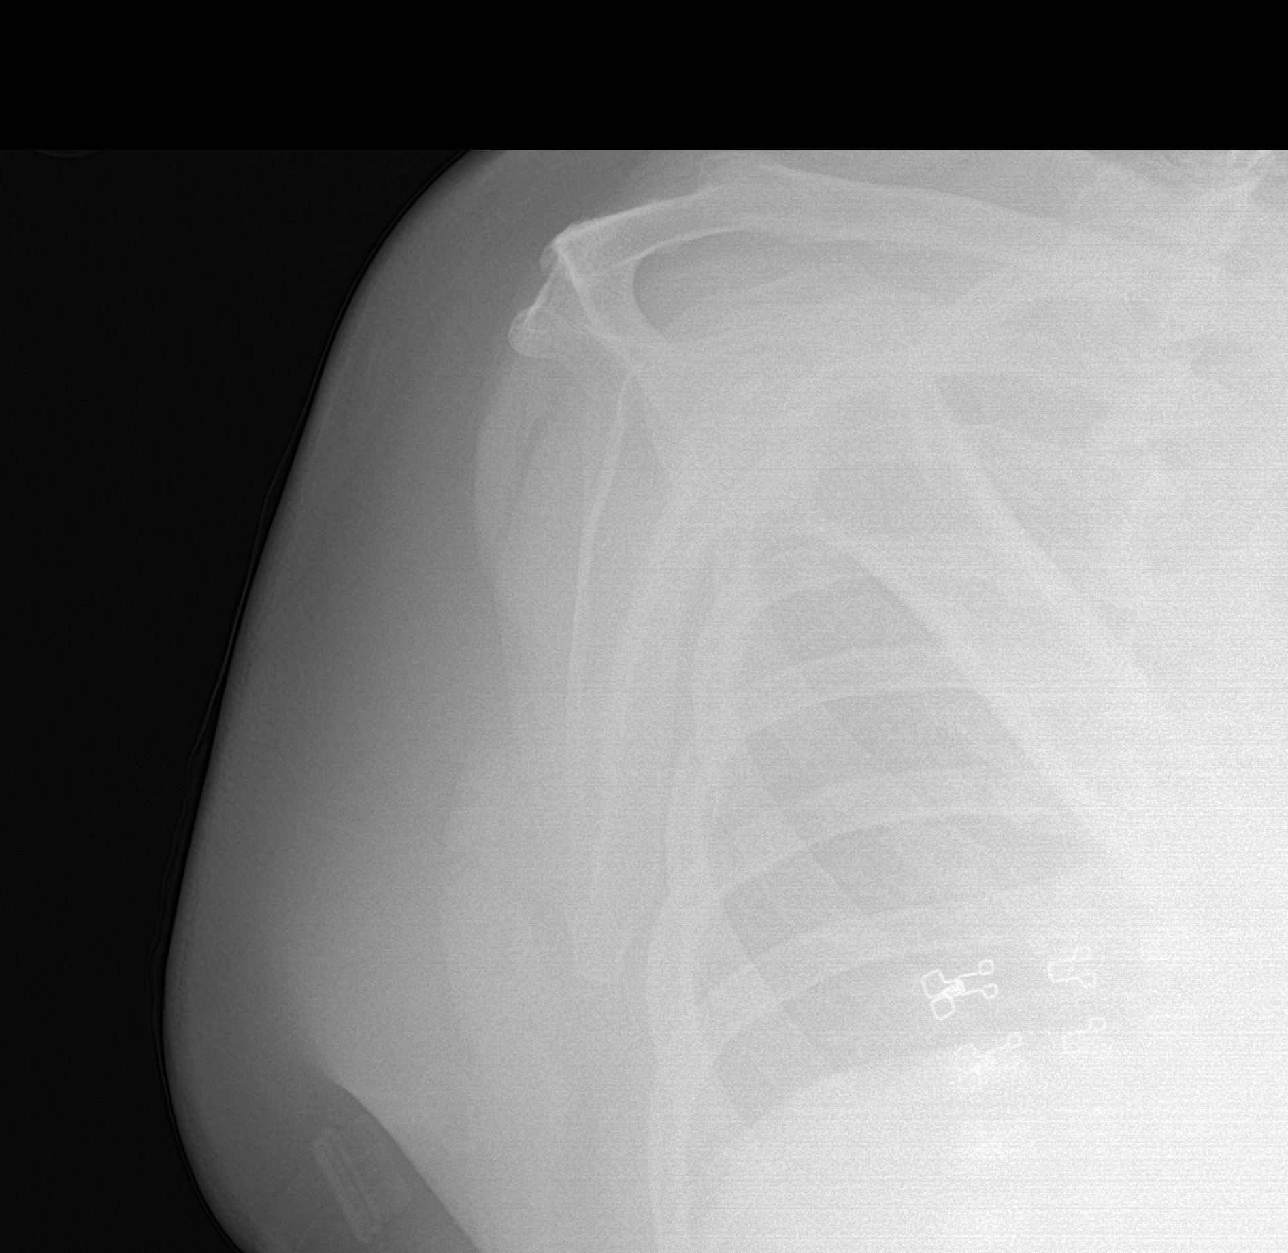

[shoulder ap]
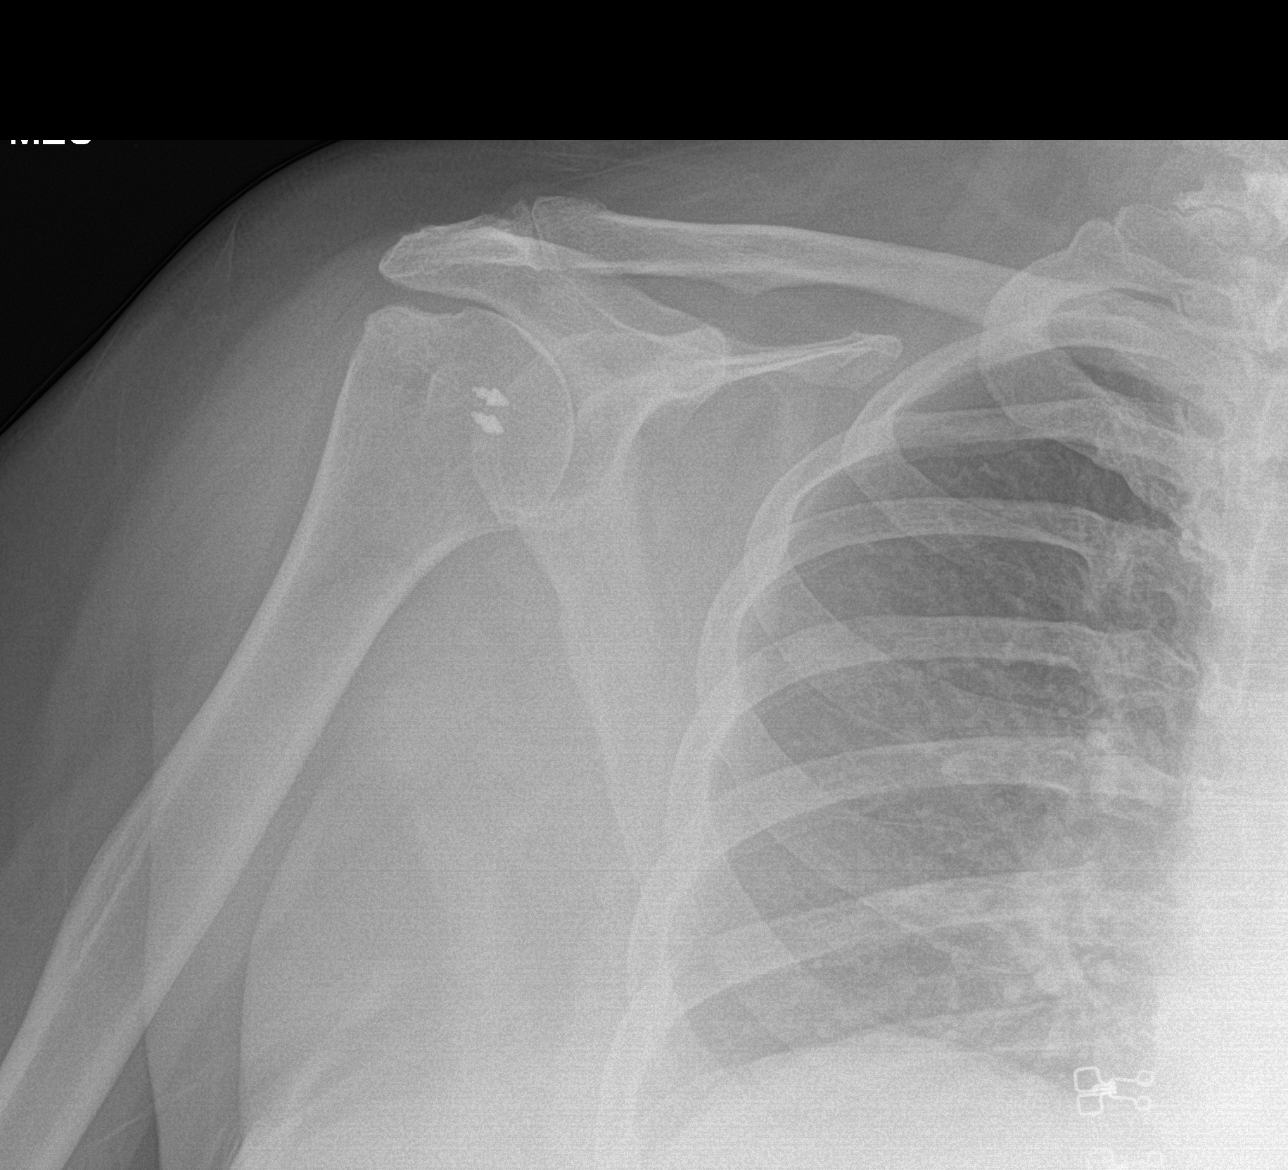

[shoulder obl]
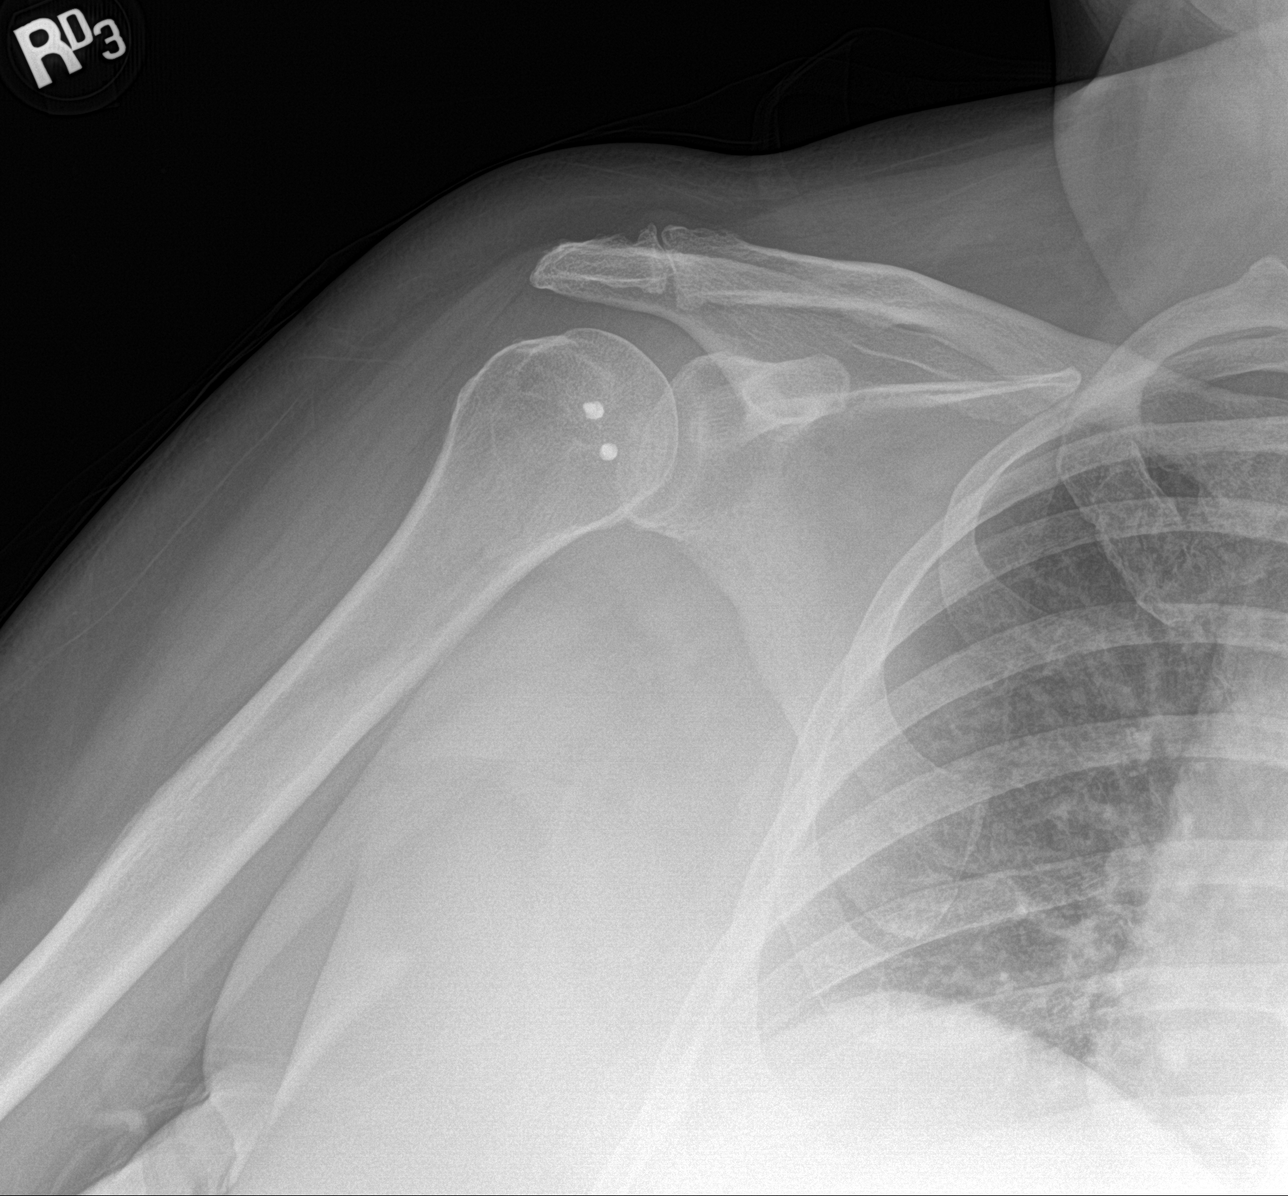

[3 of 3 positions shown; findings below may reference images not displayed]

FINDINGS: There is no evidence of fracture or dislocation. Mild degenerative
changes of the acromioclavicular joint. Bone anchors of the proximal
humerus. Soft tissues are unremarkable.
IMPRESSION: No acute osseous abnormality.
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
## 2021-02-15 MED ORDER — CYCLOBENZAPRINE HCL 5 MG PO TABS: 5.0000 mg | ORAL_TABLET | Freq: Three times a day (TID) | ORAL | 0 refills | Status: AC | PRN
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
## 2021-02-15 NOTE — Discharge Instructions (Addendum)
-  Treat pain with Tylenol/ibuprofen.  Use cyclobenzaprine as needed. -Follow-up with your primary care provider regarding the incidental findings on your cervical spine CT -Return to the emergency department at any time if you begin to experience any new or worsening symptoms.
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
## 2021-02-16 ENCOUNTER — Ambulatory Visit: Payer: Self-pay | Admitting: Internal Medicine

## 2021-02-16 ENCOUNTER — Encounter: Payer: Self-pay | Admitting: Internal Medicine

## 2021-02-16 DIAGNOSIS — S060X0D Concussion without loss of consciousness, subsequent encounter: Secondary | ICD-10-CM

## 2021-02-16 NOTE — Patient Instructions (Addendum)
It was great seeing you today!  Plan discussed at today's visit: -Please follow instructions below, try to rest and your symptoms should improve over the next several days   Follow up in: 1 week  Take care and let us know if you have any questions or concerns prior to your next visit.  Dr. Rosana Berger  Concussion, Adult A series of images showing how the quick head movements of a concussion injure the brain.  A concussion is a brain injury from a hard, direct hit (trauma) to the head or body. This direct hit causes the brain to shake quickly back and forth inside the skull. This can damage brain cells and cause chemical changes in the brain. A concussion may also be known as a mild traumatic brain injury (TBI). Concussions are usually not life-threatening, but the effects of a concussion can be serious. If you have a concussion, you should be very careful to avoid having a second concussion. What are the causes? This condition is caused by: A direct hit to your head, such as: Running into another player during a game. Being hit in a fight. Hitting your head on a hard surface. Sudden movement of your body that causes your brain to move back and forth inside the skull, such as in a car crash. What are the signs or symptoms? The signs of a concussion can be hard to notice. Early on, they may be missed by you, family members, and health care providers. You may look fine on the outside but may act or feel differently. Every head injury is different. Symptoms are usually temporary but may last for days, weeks, or even months. Some symptoms appear right away, but other symptoms may not show up for hours or days. If your symptoms last longer than normal, you may have post-concussion syndrome. Physical symptoms Headaches. Dizziness and problems with coordination or balance. Sensitivity to light or noise. Nausea or vomiting. Tiredness (fatigue). Vision or hearing problems. Changes in eating or  sleeping patterns. Seizure. Mental and emotional symptoms Irritability or mood changes. Memory problems. Trouble concentrating, organizing, or making decisions. Slowness in thinking, acting or reacting, speaking, or reading. Anxiety or depression. How is this diagnosed? This condition is diagnosed based on: Your symptoms. A description of your injury. You may also have tests, including: Imaging tests, such as a CT scan or an MRI. Neuropsychological tests. These measure your thinking, understanding, learning, and remembering abilities. How is this treated? Treatment for this condition includes: Stopping sports or activity if you are injured. If you hit your head or show signs of concussion: Do not return to sports or activities the same day. Get checked by a health care provider before you return to your activities. Physical and mental rest and careful observation, usually at home. Gradually return to your normal activities. Medicines to help with symptoms such as headaches, nausea, or difficulty sleeping. Avoid taking opioid pain medicine while recovering from a concussion. Avoiding alcohol and drugs. These may slow your recovery and can put you at risk of further injury. Referral to a concussion clinic or rehabilitation center. Recovery from a concussion can take time. How fast you recover depends on many factors. Return to activities only when: Your symptoms are completely gone. Your health care provider says that it is safe. Follow these instructions at home: Activity Limit activities that require a lot of thought or concentration, such as: Doing homework or job-related work. Watching TV. Working on the computer or phone. Playing memory games and  puzzles. Rest. Rest helps your brain heal. Make sure you: Get plenty of sleep. Most adults should get 7-9 hours of sleep each night. Rest during the day. Take naps or rest breaks when you feel tired. Avoid physical activity like  exercise until your health care provider says it is safe. Stop any activity that worsens symptoms. Do not do high-risk activities that could cause a second concussion, such as riding a bike or playing sports. Ask your health care provider when you can return to your normal activities, such as school, work, athletics, and driving. Your ability to react may be slower after a brain injury. Never do these activities if you are dizzy. Your health care provider will likely give you a plan for gradually returning to activities. General instructions A bottle of beer, a glass of wine, and a glass of hard liquor with a &quot;do not drink&quot; sign over them.   Take over-the-counter and prescription medicines only as told by your health care provider. Some medicines, such as blood thinners (anticoagulants) and aspirin, may increase the risk for complications, such as bleeding. Do not drink alcohol until your health care provider says you can. Watch your symptoms and tell others around you to do the same. Complications sometimes occur after a concussion. Older adults with a brain injury may have a higher risk of serious complications. Tell your work Freight forwarder, teachers, Government social research officer, school counselor, coach, or Product/process development scientist about your injury, symptoms, and restrictions. Keep all follow-up visits as told by your health care provider. This is important. How is this prevented? Avoiding another brain injury is very important. In rare cases, another injury can lead to permanent brain damage, brain swelling, or death. The risk of this is greatest during the first 7-10 days after a head injury. Avoid injuries by: Stopping activities that could lead to a second concussion, such as contact or recreational sports, until your health care provider says it is okay. Taking these actions once you have returned to sports or activities: Avoiding plays or moves that can cause you to crash into another person. This is how most  concussions occur. Following the rules and being respectful of other players. Do not engage in violent or illegal plays. Getting regular exercise that includes strength and balance training. Wearing a properly fitting helmet during sports, biking, or other activities. Helmets can help protect you from serious skull and brain injuries, but they may not protect you from a concussion. Even when wearing a helmet, you should avoid being hit in the head. Contact a health care provider if: Your symptoms do not improve. You have new symptoms. You have another injury. Get help right away if: You have new or worsening physical symptoms, such as: A severe or worsening headache. Weakness or numbness in any part of your body, slurred speech, vision changes, or confusion. Your coordination gets worse. Vomiting repeatedly. You have a seizure. You have unusual behavior changes. You lose consciousness, are sleepier than normal, or are difficult to wake up. These symptoms may represent a serious problem that is an emergency. Do not wait to see if the symptoms will go away. Get medical help right away. Call your local emergency services (911 in the [place] Do not drive yourself to the hospital. Summary A concussion is a brain injury that results from a hard, direct hit (trauma) to your head or body. You may have imaging tests and neuropsychological tests to diagnose a concussion. Treatment for this condition includes physical and mental rest and  careful observation. Ask your health care provider when you can return to your normal activities, such as school, work, athletics, and driving. Get help right away if you have a severe headache, weakness in any part of the body, seizures, behavior changes, changes in vision, or if you are confused or sleepier than normal. This information is not intended to replace advice given to you by your health care provider. Make sure you discuss any questions you have with your  health care provider. Document Revised: 03/11/2020 Document Reviewed: 03/11/2020 Elsevier Patient Education  [place].
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
## 2021-02-17 ENCOUNTER — Encounter: Payer: Self-pay | Admitting: Orthopaedic Surgery

## 2021-02-17 ENCOUNTER — Other Ambulatory Visit: Payer: Self-pay

## 2021-02-17 ENCOUNTER — Ambulatory Visit (INDEPENDENT_AMBULATORY_CARE_PROVIDER_SITE_OTHER): Payer: Managed Care, Other (non HMO) | Admitting: Orthopaedic Surgery

## 2021-02-17 ENCOUNTER — Ambulatory Visit: Payer: Managed Care, Other (non HMO)

## 2021-02-17 DIAGNOSIS — Z9889 Other specified postprocedural states: Secondary | ICD-10-CM | POA: Diagnosis not present

## 2021-02-17 DIAGNOSIS — M25511 Pain in right shoulder: Secondary | ICD-10-CM

## 2021-02-17 DIAGNOSIS — R293 Abnormal posture: Secondary | ICD-10-CM

## 2021-02-17 DIAGNOSIS — M6281 Muscle weakness (generalized): Secondary | ICD-10-CM

## 2021-02-17 DIAGNOSIS — M25611 Stiffness of right shoulder, not elsewhere classified: Secondary | ICD-10-CM
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
## 2021-02-21 ENCOUNTER — Ambulatory Visit: Payer: Managed Care, Other (non HMO) | Admitting: Internal Medicine

## 2021-02-22 ENCOUNTER — Ambulatory Visit: Payer: Managed Care, Other (non HMO)

## 2021-02-22 ENCOUNTER — Other Ambulatory Visit: Payer: Self-pay

## 2021-02-22 DIAGNOSIS — M6281 Muscle weakness (generalized): Secondary | ICD-10-CM | POA: Diagnosis not present

## 2021-02-22 DIAGNOSIS — M25611 Stiffness of right shoulder, not elsewhere classified: Secondary | ICD-10-CM

## 2021-02-22 DIAGNOSIS — M25511 Pain in right shoulder: Secondary | ICD-10-CM

## 2021-02-22 NOTE — Therapy (Signed)
[facility] [address], [place], [address] Phone: [phone]   Fax:  [phone]  Physical Therapy Treatment  Patient Details  Name: Tina Stephenson MRN: [id number] Date of Birth: December 22, 1955 Referring Provider (PT): Dwana Melena, [place]   Encounter Date: 02/22/2021   PT End of Session - 02/22/21 0802     Visit Number 35    Number of Visits 40    Date for PT Re-Evaluation 03/03/21    Authorization Type [employer] Managed    Authorization Time Period 20% CO-INSURANCE, DED MET, 60 DAYS PER CALENDER YEAR AFTER 5TH VISIT, MEDICAL NESSITY NEEDED/REF#1980    Authorization - Visit Number 11    Authorization - Number of Visits 26    PT Start Time 0802    PT Stop Time 0845    PT Time Calculation (min) 43 min    Activity Tolerance Patient tolerated treatment well    Behavior During Therapy WFL for tasks assessed/performed             Past Medical History:  Diagnosis Date   Bronchitis    Depression    Diabetes mellitus without complication ([place])    Gallbladder polyp    GERD (gastroesophageal reflux disease)    Hypertension    Hypertensive disorder 03/05/2015   Liver tumor (benign)    Sacrum and coccyx fracture ([place])    Sciatica     Past Surgical History:  Procedure Laterality Date   APPENDECTOMY     NASAL SEPTUM SURGERY     WISDOM TOOTH EXTRACTION      There were no vitals filed for this visit.   Subjective Assessment - 02/22/21 0803     Subjective R shoulder is ok, no pain currently. Saw her surgeon last week. Was told to continue doing PT. Only has 10 more appointments after February for her insurance because she only gets 26 visits per year.    Pertinent History Tina Stephenson is a 65yoF referred by Dwana Melena, PA s/p right rotator cuff repair on 10/14/2020 c Dr. Erlinda Hong at [facility] s/p full-thickness tear of Supraspinatus. Pt also has Left supraspinatus full thickness tear, operative management awaiting  full recovery from Rt surgery. She was immobilized post procedure, then on 11/17 allowed to DC the shoulder sling.    Patient Stated Goals To move dominant arm, no pain, full range, return to work    Currently in Pain? No/denies                                        PT Education - 02/22/21 1694     Education Details ther-ex, HEP    Person(s) Educated Patient    Methods Explanation;Demonstration;Tactile cues;Verbal cues;Handout    Comprehension Returned demonstration;Verbalized understanding             Manual therapy    Seated A to P to R humeral head grade 3 to promote R shoulder IR.  Standing manual gentle R shoulder long axis distraction grade 3 to promote mobility and decrease stiffness  Supine with R shoulder in comfortable abduction: A to P to R humeral head, grade 3 to promote mobility and decrease stiffness     Therapeutic exercises   Reviewed POC: 2x/week for February, then 1x per week after that.    Standing R triceps extension isometrics, hand on door knob 10x3 with 5 second holds  Improved R shoulder functional IR AROM  Standing R shoulder IR, red band with 5 lbs ankle weight at distal arm and A to P pressure to R shoulder joint with PT 10x  Standing table push-up position: R shoulder weight shift to promote posterior shoulder joint mobility 10x2 with 5 second holds   Limited R shoulder functional IR afterwards  Standing shoulder extension red band 10x5 seconds   Improved R shouldre functional IR    Improved exercise technique, movement at target joints, use of target muscles after mod verbal, visual, tactile cues.         Response to treatment Pt tolerated session well without aggravation of symptoms.      Clinical impression. Improved R shoulder functional IR with treatment to promote posterior glide of R humeral head as well as activation of posterior shoulder and triceps muscles.  Pt tolerated session well without  aggravation of symptoms. Pt will benefit from continued skilled physical therapy services to decrease pain, improve strength and function.           PT Short Term Goals - 01/06/21 1219       PT SHORT TERM GOAL #1   Title FOTO improved to 35%    Baseline Unable to access FOTO information and retrieve questionnaire for pt to fill out. (11/22/2020)    Time 4    Period Weeks    Status On-going    Target Date 11/23/20      PT SHORT TERM GOAL #2   Title right shoulder PROM flexion & abduction 100*    Baseline Supine AAROM R shoulder 115 degrees flexion, 91 degrees abduction (11/22/2020); supine AAROM R shoulder flexion 120 degrees, abduction 97 degrees (12/22/2020); 131 degrees flexion AAROM, 100 degrees abduction (01/06/2021)    Time 4    Period Weeks    Status Achieved    Target Date 11/23/20      PT SHORT TERM GOAL #3   Title Patient is independent with initial HEP; No questions after reviewed and redemonstrated today (11/22/2020)    Time 4    Period Weeks    Status Achieved    Target Date 11/23/20               PT Long Term Goals - 02/01/21 1335       PT LONG TERM GOAL #1   Title Patient reports pain in right shoulder </= 2/10 with activities    Baseline 3/10 at most for the past 7 days (12/22/2020); 1-2/10 at worst, occasional 3/10 (01/06/2021); 02/01/21- 3/10 NPS    Time 8    Period Weeks    Status Partially Met    Target Date 03/03/21      PT LONG TERM GOAL #2   Title FOTO >/= 55%    Time 10    Period Weeks    Status Unable to assess    Target Date 01/07/21      PT LONG TERM GOAL #3   Title Right shoulder AROM Flexion & abduction 120* and external & internal rotation 60*    Baseline R shoulder AROM flexion 73 degrees (improved to 95 degrees after session), abduction 55 degrees (improved to 72 degrees after session), ER 26 degrees, IR 37 degrees (rotation ROM at 80-90 degrees abduction position) (12/22/2020); 83 degrees flexion, 61 degrees abduction, 42  degrees ER and IR at 90 degrees abduction (01/06/2021); flexion 128 deg, abduction 95 deg, ER 51 deg, IR 90 deg    Time 8  Period Weeks   ° Status On-going   ° Target Date 03/03/21   °  ° PT LONG TERM GOAL #4  ° Title Right shoulder strength >/= 4/5   ° Baseline R shoulder strength about 3-/5 secondary to limited AROM. (12/22/2020); At available range: ER 4/5, IR 4/5, flexion and abduction 3-/5 (01/06/2021); 02/01/21: ER 4/5 and IR 5/5, Shoulder flexion 4/5, shoulder abduction: 4+/5   ° Time 8   ° Period Weeks   ° Status Achieved   ° Target Date 02/01/21   ° °  °  ° °  ° ° ° ° ° ° ° ° Plan - 02/22/21 0822   ° ° Clinical Impression Statement Improved R shoulder functional IR with treatment to promote posterior glide of R humeral head as well as activation of posterior shoulder and triceps muscles.  Pt tolerated session well without aggravation of symptoms. Pt will benefit from continued skilled physical therapy services to decrease pain, improve strength and function.   ° Personal Factors and Comorbidities Comorbidity 3+   ° Comorbidities DM2, cervical herniated disc, scoliosis, obesity, HTN,   ° Examination-Activity Limitations Lift;Carry;Reach Overhead;Sleep   ° Examination-Participation Restrictions Occupation;Yard Work   ° Stability/Clinical Decision Making Stable/Uncomplicated   ° Clinical Decision Making Low   ° Rehab Potential Good   ° PT Frequency 2x / week   ° PT Duration 8 weeks   ° PT Treatment/Interventions ADLs/Self Care Home Management;Cryotherapy;Electrical Stimulation;Moist Heat;Functional mobility training;Therapeutic activities;Therapeutic exercise;Balance training;Neuromuscular re-education;Patient/family education;Manual techniques;Scar mobilization;Passive range of motion;Vasopneumatic Device;Joint Manipulations   ° PT Next Visit Plan HEP, PROM & manual therapy to increase ROM; check measurements   ° PT Home Exercise Plan Access Code: [email or web address]; Medbridge Access Code [email or web address]; Access Code:  [email or web address]   ° Consulted and Agree with Plan of Care Patient   ° °  °  ° °  ° ° °Patient will benefit from skilled therapeutic intervention in order to improve the following deficits and impairments:  Decreased coordination, Decreased endurance, Decreased range of motion, Decreased strength, Increased edema, Increased muscle spasms, Postural dysfunction, Obesity, Pain ° °Visit Diagnosis: °Muscle weakness (generalized) ° °Acute pain of right shoulder ° °Stiffness of right shoulder, not elsewhere classified ° ° ° ° °Problem List °Patient Active Problem List  ° Diagnosis Date Noted  ° Upper respiratory infection, acute 12/20/2020  ° Bruised toe 12/20/2020  ° Traumatic tear of supraspinatus tendon of right shoulder 09/24/2020  ° Tendinopathy of right biceps tendon 09/24/2020  ° Traumatic tear of supraspinatus tendon of left shoulder 09/24/2020  ° Leg swelling 08/02/2020  ° Dyslipidemia 01/05/2020  ° Cervical polyp 08/22/2018  ° Nocturia 08/22/2018  ° Intertrigo 08/22/2018  ° Type 2 diabetes mellitus without complication, without long-term current use of insulin ([facility]) 08/22/2018  ° Cervical spine arthritis with nerve pain 06/25/2018  ° Herniated disc, cervical 06/25/2018  ° History of migraine 03/25/2018  ° Scoliosis 03/25/2018  ° History of cyst of breast 03/25/2018  ° Sacrum and coccyx fracture, sequela 02/21/2018  ° Class 3 severe obesity due to excess calories without serious comorbidity with body mass index (BMI) of 45.0 to 49.9 in adult ([facility]) 02/21/2018  ° Cough 02/21/2018  ° Onychomycosis 02/21/2018  ° History of hypertension 02/21/2018  ° Vitamin D deficiency 03/19/2015  ° Essential hypertension 03/05/2015  ° ° °Miguel Laygo PT, DPT ° ° °02/22/2021, 10:44 AM ° °[place] °[facility] °[address] °[place], [place], [address] °Phone: [phone]   Fax:    [phone] ° °Name: Avilene Denise °MRN: [id number] °Date of Birth: 02/27/1955 ° ° ° °

## 2021-02-22 NOTE — Patient Instructions (Signed)
Access Code: [email or web address] URL: [email or web address] Date: 02/22/2021 Prepared by: Joneen Boers  Exercises Chest and Bicep Stretch - Arms Behind Back - 1 x daily - 7 x weekly - 1 sets - 3 reps - 20 hold Doorway Pec Stretch at 90 Degrees Abduction - 1 x daily - 7 x weekly - 1 sets - 3 reps - 30 hold Isometric Tricep Extension - 1 x daily - 7 x weekly - 3 sets - 10 reps - 5 seconds hold

## 2021-02-24 ENCOUNTER — Ambulatory Visit: Payer: Managed Care, Other (non HMO)

## 2021-02-24 ENCOUNTER — Other Ambulatory Visit: Payer: Self-pay

## 2021-02-24 DIAGNOSIS — M6281 Muscle weakness (generalized): Secondary | ICD-10-CM | POA: Diagnosis not present

## 2021-02-24 DIAGNOSIS — M25611 Stiffness of right shoulder, not elsewhere classified: Secondary | ICD-10-CM

## 2021-02-24 NOTE — Therapy (Signed)
[facility] [address] [place], [address] Phone: [phone]   Fax:  [phone]  Physical Therapy Treatment  Patient Details  Name: Tina Stephenson MRN: [id number] Date of Birth: 08-Jun-1955 Referring Provider (PT): Dwana Melena, [place]   Encounter Date: 02/24/2021   PT End of Session - 02/24/21 0802     Visit Number 36    Number of Visits 40    Date for PT Re-Evaluation 03/03/21    Authorization Type [employer] Managed    Authorization Time Period 20% CO-INSURANCE, DED MET, 60 DAYS PER CALENDER YEAR AFTER 5TH VISIT, MEDICAL NESSITY NEEDED/REF#1980    Authorization - Visit Number 12    Authorization - Number of Visits 26    PT Start Time 0802    PT Stop Time 4818    PT Time Calculation (min) 42 min    Activity Tolerance Patient tolerated treatment well    Behavior During Therapy [facility] for tasks assessed/performed             Past Medical History:  Diagnosis Date   Bronchitis    Depression    Diabetes mellitus without complication ([place])    Gallbladder polyp    GERD (gastroesophageal reflux disease)    Hypertension    Hypertensive disorder 03/05/2015   Liver tumor (benign)    Sacrum and coccyx fracture ([place])    Sciatica     Past Surgical History:  Procedure Laterality Date   APPENDECTOMY     NASAL SEPTUM SURGERY     WISDOM TOOTH EXTRACTION      There were no vitals filed for this visit.   Subjective Assessment - 02/24/21 0803     Subjective R shoulder is a little discomfort, not quite a 1/10.    Pertinent History Tina Stephenson is a 36yoF referred by Dwana Melena, PA s/p right rotator cuff repair on 10/14/2020 c Dr. Erlinda Hong at [facility] s/p full-thickness tear of Supraspinatus. Pt also has Left supraspinatus full thickness tear, operative management awaiting full recovery from Rt surgery. She was immobilized post procedure, then on 11/17 allowed to DC the shoulder sling.    Patient Stated Goals To move  dominant arm, no pain, full range, return to work    Currently in Pain? Yes    Pain Score 1                                         PT Education - 02/24/21 0805     Education Details ther-ex    Person(s) Educated Patient    Methods Explanation;Demonstration;Tactile cues;Verbal cues    Comprehension Returned demonstration;Verbalized understanding            Manual therapy    Seated A to P to R humeral head grade 3 to promote R shoulder IR.  Supine with R shoulder in comfortable abduction: A to P to R humeral head, grade 3 to promote mobility and decrease stiffness   Standing manual gentle R shoulder long axis distraction grade 3 to promote mobility and decrease stiffness    Standing with A to P pressure to R humeral head, Pt performs R shoulder IR (reaching behind back) comfortably 10x3.   Improved R shoulder functional IR AROM  Seated STM R posterior shoulder to decrease fascial restirctions   Seated STM R infraspinatus to decrease tension        Therapeutic  exercises    Standing shoulder extension red band 10x5 seconds for 3 sets            Standing R triceps extension isometrics, hand on door knob 10x3 with 5 second holds   Seated R shoulder self inferior mobilization 10x3 with 5 second holds to decrease stiffness  Seated R upper trap stretch 30 seconds x 3   Improved exercise technique, movement at target joints, use of target muscles after mod verbal, visual, tactile cues.         Response to treatment Pt tolerated session well without aggravation of symptoms.      Clinical impression. Improved R shoulder functional IR with treatment to promote posterior glide of R humeral head. Pt tolerated session well without aggravation of symptoms. Pt will benefit from continued skilled physical therapy services to decrease pain, improve strength and function.         PT Short Term Goals - 01/06/21 1219       PT SHORT TERM GOAL  #1   Title FOTO improved to 35%    Baseline Unable to access FOTO information and retrieve questionnaire for pt to fill out. (11/22/2020)    Time 4    Period Weeks    Status On-going    Target Date 11/23/20      PT SHORT TERM GOAL #2   Title right shoulder PROM flexion & abduction 100*    Baseline Supine AAROM R shoulder 115 degrees flexion, 91 degrees abduction (11/22/2020); supine AAROM R shoulder flexion 120 degrees, abduction 97 degrees (12/22/2020); 131 degrees flexion AAROM, 100 degrees abduction (01/06/2021)    Time 4    Period Weeks    Status Achieved    Target Date 11/23/20      PT SHORT TERM GOAL #3   Title Patient is independent with initial HEP; No questions after reviewed and redemonstrated today (11/22/2020)    Time 4    Period Weeks    Status Achieved    Target Date 11/23/20               PT Long Term Goals - 02/01/21 1335       PT LONG TERM GOAL #1   Title Patient reports pain in right shoulder </= 2/10 with activities    Baseline 3/10 at most for the past 7 days (12/22/2020); 1-2/10 at worst, occasional 3/10 (01/06/2021); 02/01/21- 3/10 NPS    Time 8    Period Weeks    Status Partially Met    Target Date 03/03/21      PT LONG TERM GOAL #2   Title FOTO >/= 55%    Time 10    Period Weeks    Status Unable to assess    Target Date 01/07/21      PT LONG TERM GOAL #3   Title Right shoulder AROM Flexion & abduction 120* and external & internal rotation 60*    Baseline R shoulder AROM flexion 73 degrees (improved to 95 degrees after session), abduction 55 degrees (improved to 72 degrees after session), ER 26 degrees, IR 37 degrees (rotation ROM at 80-90 degrees abduction position) (12/22/2020); 83 degrees flexion, 61 degrees abduction, 42 degrees ER and IR at 90 degrees abduction (01/06/2021); flexion 128 deg, abduction 95 deg, ER 51 deg, IR 90 deg    Time 8    Period Weeks    Status On-going    Target Date 03/03/21      PT LONG TERM GOAL #4   Title  Right shoulder strength >/= 4/5    Baseline R shoulder strength about 3-/5 secondary to limited AROM. (12/22/2020); At available range: ER 4/5, IR 4/5, flexion and abduction 3-/5 (01/06/2021); 02/01/21: ER 4/5 and IR 5/5, Shoulder flexion 4/5, shoulder abduction: 4+/5    Time 8    Period Weeks    Status Achieved    Target Date 02/01/21                   Plan - 02/24/21 0806     Clinical Impression Statement Improved R shoulder functional IR with treatment to promote posterior glide of R humeral head. Pt tolerated session well without aggravation of symptoms. Pt will benefit from continued skilled physical therapy services to decrease pain, improve strength and function.    Personal Factors and Comorbidities Comorbidity 3+    Comorbidities DM2, cervical herniated disc, scoliosis, obesity, HTN,    Examination-Activity Limitations Lift;Carry;Reach Overhead;Sleep    Examination-Participation Restrictions Occupation;Yard Work    Stability/Clinical Decision Making Stable/Uncomplicated    Rehab Potential Good    PT Frequency 2x / week    PT Duration 8 weeks    PT Treatment/Interventions ADLs/Self Care Home Management;Cryotherapy;Electrical Stimulation;Moist Heat;Functional mobility training;Therapeutic activities;Therapeutic exercise;Balance training;Neuromuscular re-education;Patient/family education;Manual techniques;Scar mobilization;Passive range of motion;Vasopneumatic Device;Joint Manipulations    PT Next Visit Plan HEP, PROM & manual therapy to increase ROM; check measurements    PT Home Exercise Plan Access Code: [email or web address]; Medbridge Access Code [email or web address]; Access Code: [email or web address]    Consulted and Agree with Plan of Care Patient             Patient will benefit from skilled therapeutic intervention in order to improve the following deficits and impairments:  Decreased coordination, Decreased endurance, Decreased range of motion, Decreased strength, Increased edema, Increased muscle  spasms, Postural dysfunction, Obesity, Pain  Visit Diagnosis: Muscle weakness (generalized)  Stiffness of right shoulder, not elsewhere classified     Problem List Patient Active Problem List   Diagnosis Date Noted   Upper respiratory infection, acute 12/20/2020   Bruised toe 12/20/2020   Traumatic tear of supraspinatus tendon of right shoulder 09/24/2020   Tendinopathy of right biceps tendon 09/24/2020   Traumatic tear of supraspinatus tendon of left shoulder 09/24/2020   Leg swelling 08/02/2020   Dyslipidemia 01/05/2020   Cervical polyp 08/22/2018   Nocturia 08/22/2018   Intertrigo 08/22/2018   Type 2 diabetes mellitus without complication, without long-term current use of insulin ([place]) 08/22/2018   Cervical spine arthritis with nerve pain 06/25/2018   Herniated disc, cervical 06/25/2018   History of migraine 03/25/2018   Scoliosis 03/25/2018   History of cyst of breast 03/25/2018   Sacrum and coccyx fracture, sequela 02/21/2018   Class 3 severe obesity due to excess calories without serious comorbidity with body mass index (BMI) of 45.0 to 49.9 in adult [facility]) 02/21/2018   Cough 02/21/2018   Onychomycosis 02/21/2018   History of hypertension 02/21/2018   Vitamin D deficiency 03/19/2015   Essential hypertension 03/05/2015   Joneen Boers PT, DPT  02/24/2021, 11:30 AM  [facility] [address] [place], [address] Phone: [phone]   Fax:  [phone]  Name: Tina Stephenson MRN: [id number] Date of Birth: [birth year]

## 2021-03-01 ENCOUNTER — Other Ambulatory Visit: Payer: Self-pay

## 2021-03-01 ENCOUNTER — Ambulatory Visit: Payer: Managed Care, Other (non HMO)

## 2021-03-01 DIAGNOSIS — M6281 Muscle weakness (generalized): Secondary | ICD-10-CM | POA: Diagnosis not present

## 2021-03-01 DIAGNOSIS — M25611 Stiffness of right shoulder, not elsewhere classified: Secondary | ICD-10-CM

## 2021-03-01 NOTE — Therapy (Signed)
[facility] [address] [place], [address] Phone: [phone]   Fax:  [phone]  Physical Therapy Treatment  Patient Details  Name: Tina Stephenson MRN: [id number] Date of Birth: September 08, 1955 Referring Provider (PT): Dwana Melena, [place]   Encounter Date: 03/01/2021   PT End of Session - 03/01/21 1504     Visit Number 37    Number of Visits 40    Date for PT Re-Evaluation 03/03/21    Authorization Type [employer] Managed    Authorization Time Period 20% CO-INSURANCE, DED MET, 60 DAYS PER CALENDER YEAR AFTER 5TH VISIT, MEDICAL NESSITY NEEDED/REF#1980    Authorization - Visit Number 43    Authorization - Number of Visits 64    PT Start Time 1504    PT Stop Time 1546    PT Time Calculation (min) 42 min    Activity Tolerance Patient tolerated treatment well    Behavior During Therapy WFL for tasks assessed/performed             Past Medical History:  Diagnosis Date   Bronchitis    Depression    Diabetes mellitus without complication ([place])    Gallbladder polyp    GERD (gastroesophageal reflux disease)    Hypertension    Hypertensive disorder 03/05/2015   Liver tumor (benign)    Sacrum and coccyx fracture ([place])    Sciatica     Past Surgical History:  Procedure Laterality Date   APPENDECTOMY     NASAL SEPTUM SURGERY     WISDOM TOOTH EXTRACTION      There were no vitals filed for this visit.   Subjective Assessment - 03/01/21 1505     Subjective R shoulder feels good. No pain. Turning her head to the L > R causes discomfort inferior to her L shoulder blade since getting up this morning,    Pertinent History Tina Stephenson is a 60yoF referred by Dwana Melena, PA s/p right rotator cuff repair on 10/14/2020 c Dr. Erlinda Hong at [facility] s/p full-thickness tear of Supraspinatus. Pt also has Left supraspinatus full thickness tear, operative management awaiting full recovery from Rt surgery. She was immobilized post  procedure, then on 11/17 allowed to DC the shoulder sling.    Patient Stated Goals To move dominant arm, no pain, full range, return to work    Currently in Pain? No/denies                                        PT Education - 03/01/21 1508     Education Details ther-ex    Person(s) Educated Patient    Methods Explanation;Demonstration;Tactile cues;Verbal cues    Comprehension Returned demonstration;Verbalized understanding              Manual therapy    Seated STM L distal scalene  and upper trap muscles to decrease tension Decreased L infererior scapular pain with L cervical rotation.   Seated A to P to R humeral head grade 3 to promote R shoulder IR.  Seated STM R distal pectoralis muscle to decrease tension    Seated STM R posterior shoulder to decrease fascial restirctions       Therapeutic exercises   Cervical extension: decreased L inferior scapular pain Seated cervical extension 10x5 seconds   Increased symptoms with L cervical rotation   L first rib stretch, PT stabilizing L shoulder blade  15 seconds x 5  Slight decrease in symptoms  Standing D1 R shoulder extension red band 10x3   Improved ability to perform functional IR   Standing R shoulder IR green band 10x5 seconds for 3 sets  Standing R shoulder extension with scapular retraction red band at top of door 10x2 with 5 second holds    Standing R triceps extension isometrics, hand on door knob 10x2 with 5 second holds    Improved exercise technique, movement at target joints, use of target muscles after mod verbal, visual, tactile cues.         Response to treatment Pt tolerated session well without aggravation of symptoms.      Clinical impression. Decreased L inferior scapular pain with treatment to decrease L scalene and upper trap muscle tension. Improved R shoulder functional IR with treatment to promote posterior glide of humeral head and activation of  subscapularis muscle. Pt tolerated session well without aggravation of symptoms. Pt will benefit from continued skilled physical therapy services to decrease pain, improve strength and function.          PT Short Term Goals - 01/06/21 1219       PT SHORT TERM GOAL #1   Title FOTO improved to 35%    Baseline Unable to access FOTO information and retrieve questionnaire for pt to fill out. (11/22/2020)    Time 4    Period Weeks    Status On-going    Target Date 11/23/20      PT SHORT TERM GOAL #2   Title right shoulder PROM flexion & abduction 100*    Baseline Supine AAROM R shoulder 115 degrees flexion, 91 degrees abduction (11/22/2020); supine AAROM R shoulder flexion 120 degrees, abduction 97 degrees (12/22/2020); 131 degrees flexion AAROM, 100 degrees abduction (01/06/2021)    Time 4    Period Weeks    Status Achieved    Target Date 11/23/20      PT SHORT TERM GOAL #3   Title Patient is independent with initial HEP; No questions after reviewed and redemonstrated today (11/22/2020)    Time 4    Period Weeks    Status Achieved    Target Date 11/23/20               PT Long Term Goals - 02/01/21 1335       PT LONG TERM GOAL #1   Title Patient reports pain in right shoulder </= 2/10 with activities    Baseline 3/10 at most for the past 7 days (12/22/2020); 1-2/10 at worst, occasional 3/10 (01/06/2021); 02/01/21- 3/10 NPS    Time 8    Period Weeks    Status Partially Met    Target Date 03/03/21      PT LONG TERM GOAL #2   Title FOTO >/= 55%    Time 10    Period Weeks    Status Unable to assess    Target Date 01/07/21      PT LONG TERM GOAL #3   Title Right shoulder AROM Flexion & abduction 120* and external & internal rotation 60*    Baseline R shoulder AROM flexion 73 degrees (improved to 95 degrees after session), abduction 55 degrees (improved to 72 degrees after session), ER 26 degrees, IR 37 degrees (rotation ROM at 80-90 degrees abduction position)  (12/22/2020); 83 degrees flexion, 61 degrees abduction, 42 degrees ER and IR at 90 degrees abduction (01/06/2021); flexion 128 deg, abduction 95 deg, ER 51 deg, IR 90 deg  Time 8    Period Weeks    Status On-going    Target Date 03/03/21      PT LONG TERM GOAL #4   Title Right shoulder strength >/= 4/5    Baseline R shoulder strength about 3-/5 secondary to limited AROM. (12/22/2020); At available range: ER 4/5, IR 4/5, flexion and abduction 3-/5 (01/06/2021); 02/01/21: ER 4/5 and IR 5/5, Shoulder flexion 4/5, shoulder abduction: 4+/5    Time 8    Period Weeks    Status Achieved    Target Date 02/01/21                   Plan - 03/01/21 1503     Clinical Impression Statement Decreased L inferior scapular pain with treatment to decrease L scalene and upper trap muscle tension. Improved R shoulder functional IR with treatment to promote posterior glide of humeral head and activation of subscapularis muscle. Pt tolerated session well without aggravation of symptoms. Pt will benefit from continued skilled physical therapy services to decrease pain, improve strength and function.    Personal Factors and Comorbidities Comorbidity 3+    Comorbidities DM2, cervical herniated disc, scoliosis, obesity, HTN,    Examination-Activity Limitations Lift;Carry;Reach Overhead;Sleep    Examination-Participation Restrictions Occupation;Yard Work    Stability/Clinical Decision Making Stable/Uncomplicated    Clinical Decision Making Low    Rehab Potential Good    PT Frequency 2x / week    PT Duration 8 weeks    PT Treatment/Interventions ADLs/Self Care Home Management;Cryotherapy;Electrical Stimulation;Moist Heat;Functional mobility training;Therapeutic activities;Therapeutic exercise;Balance training;Neuromuscular re-education;Patient/family education;Manual techniques;Scar mobilization;Passive range of motion;Vasopneumatic Device;Joint Manipulations    PT Next Visit Plan HEP, PROM & manual therapy  to increase ROM; check measurements    PT Home Exercise Plan Access Code: [email or web address]; Medbridge Access Code [email or web address]; Access Code: [email or web address]    Consulted and Agree with Plan of Care Patient             Patient will benefit from skilled therapeutic intervention in order to improve the following deficits and impairments:  Decreased coordination, Decreased endurance, Decreased range of motion, Decreased strength, Increased edema, Increased muscle spasms, Postural dysfunction, Obesity, Pain  Visit Diagnosis: Muscle weakness (generalized)  Stiffness of right shoulder, not elsewhere classified     Problem List Patient Active Problem List   Diagnosis Date Noted   Upper respiratory infection, acute 12/20/2020   Bruised toe 12/20/2020   Traumatic tear of supraspinatus tendon of right shoulder 09/24/2020   Tendinopathy of right biceps tendon 09/24/2020   Traumatic tear of supraspinatus tendon of left shoulder 09/24/2020   Leg swelling 08/02/2020   Dyslipidemia 01/05/2020   Cervical polyp 08/22/2018   Nocturia 08/22/2018   Intertrigo 08/22/2018   Type 2 diabetes mellitus without complication, without long-term current use of insulin ([place]) 08/22/2018   Cervical spine arthritis with nerve pain 06/25/2018   Herniated disc, cervical 06/25/2018   History of migraine 03/25/2018   Scoliosis 03/25/2018   History of cyst of breast 03/25/2018   Sacrum and coccyx fracture, sequela 02/21/2018   Class 3 severe obesity due to excess calories without serious comorbidity with body mass index (BMI) of 45.0 to 49.9 in adult [facility]) 02/21/2018   Cough 02/21/2018   Onychomycosis 02/21/2018   History of hypertension 02/21/2018   Vitamin D deficiency 03/19/2015   Essential hypertension 03/05/2015   Joneen Boers PT, DPT  03/01/2021, 4:43 PM  [facility] [address], [place], [address]  Phone: [phone]   Fax:   [phone]  Name: Tina Stephenson MRN: [id number] Date of Birth: 1955/07/24

## 2021-03-03 ENCOUNTER — Ambulatory Visit: Payer: Managed Care, Other (non HMO)

## 2021-03-03 ENCOUNTER — Other Ambulatory Visit: Payer: Self-pay

## 2021-03-03 DIAGNOSIS — M25611 Stiffness of right shoulder, not elsewhere classified: Secondary | ICD-10-CM

## 2021-03-03 DIAGNOSIS — M6281 Muscle weakness (generalized): Secondary | ICD-10-CM | POA: Diagnosis not present

## 2021-03-03 DIAGNOSIS — M25511 Pain in right shoulder: Secondary | ICD-10-CM

## 2021-03-03 NOTE — Patient Instructions (Signed)
Pt was recommended to massage the skin anterior R shoulder, and arm to help improve fascial mobility. Pt verbalized understanding.

## 2021-03-03 NOTE — Therapy (Signed)
[facility] [address] [place], [address] Phone: [phone]   Fax:  [phone]  Physical Therapy Treatment  Patient Details  Name: Tina Stephenson MRN: [id number] Date of Birth: Jun 25, 1955 Referring Provider (PT): Dwana Melena, [place]   Encounter Date: 03/03/2021   PT End of Session - 03/03/21 1718     Visit Number 65    Number of Visits 36    Date for PT Re-Evaluation 04/07/21    Authorization Type [employer] Managed    Authorization Time Period 20% CO-INSURANCE, DED MET, 60 DAYS PER CALENDER YEAR AFTER 5TH VISIT, MEDICAL NESSITY NEEDED/REF#1980    Authorization - Visit Number 53    Authorization - Number of Visits 41    PT Start Time 7858    PT Stop Time 1801    PT Time Calculation (min) 42 min    Activity Tolerance Patient tolerated treatment well    Behavior During Therapy WFL for tasks assessed/performed             Past Medical History:  Diagnosis Date   Bronchitis    Depression    Diabetes mellitus without complication ([place])    Gallbladder polyp    GERD (gastroesophageal reflux disease)    Hypertension    Hypertensive disorder 03/05/2015   Liver tumor (benign)    Sacrum and coccyx fracture ([place])    Sciatica     Past Surgical History:  Procedure Laterality Date   APPENDECTOMY     NASAL SEPTUM SURGERY     WISDOM TOOTH EXTRACTION      There were no vitals filed for this visit.   Subjective Assessment - 03/03/21 1720     Subjective R shoulder feels good.    Pertinent History Tina Stephenson is a 54yoF referred by Dwana Melena, PA s/p right rotator cuff repair on 10/14/2020 c Dr. Erlinda Hong at [facility] s/p full-thickness tear of Supraspinatus. Pt also has Left supraspinatus full thickness tear, operative management awaiting full recovery from Rt surgery. She was immobilized post procedure, then on 11/17 allowed to DC the shoulder sling.    Patient Stated Goals To move dominant arm, no pain, full  range, return to work    Currently in Pain? No/denies                                        PT Education - 03/03/21 1813     Education Details ther-ex, HEP    Person(s) Educated Patient    Methods Explanation;Demonstration;Tactile cues;Verbal cues    Comprehension Returned demonstration;Verbalized understanding           Manual therapy   Supine posterior inferior glide R humeral head in comfortable abduction grade 3  Decreased R shoulder function IR AROM  Seated A to P to R humeral head grade 3 to promote R shoulder IR.  Improved R shoulder functional IR     Seated STM R anterior shoulder to decrease fascial restrictions   R shoulder functional IR AROM improved to R thumb to T11/T12 area.     Therapeutic exercises   R shoulder AROM flexion, abduction, ER and IR 1-2x each way.  Seated R shoulder adduction stretch 20 seconds x5 low  Standing R shoulder functional IR with gentle long axis distraction 10x2  R shoulder function IR AROM improved to R thumb to T9/T10 area.  Improved exercise technique, movement at target joints, use of target muscles after mod verbal, visual, tactile cues.         Response to treatment Pt tolerated session well without aggravation of symptoms.      Clinical impression. Pt demonstrates overall decreased R shoulder pain, improved R shoulder AROM all planes, strength, and function. Improved R shoulder functional IR with treatment to decrease soft tissue restrictions anterior shoulder as well as improving intraarticular motion. Pt tolerated session well without aggravation of symptoms. Pt will benefit from continued skilled physical therapy services to decrease pain, improve strength and function.    PT Short Term Goals - 01/06/21 1219       PT SHORT TERM GOAL #1   Title FOTO improved to 35%    Baseline Unable to access FOTO information and retrieve questionnaire for pt to fill out. (11/22/2020)     Time 4    Period Weeks    Status On-going    Target Date 11/23/20      PT SHORT TERM GOAL #2   Title right shoulder PROM flexion & abduction 100*    Baseline Supine AAROM R shoulder 115 degrees flexion, 91 degrees abduction (11/22/2020); supine AAROM R shoulder flexion 120 degrees, abduction 97 degrees (12/22/2020); 131 degrees flexion AAROM, 100 degrees abduction (01/06/2021)    Time 4    Period Weeks    Status Achieved    Target Date 11/23/20      PT SHORT TERM GOAL #3   Title Patient is independent with initial HEP; No questions after reviewed and redemonstrated today (11/22/2020)    Time 4    Period Weeks    Status Achieved    Target Date 11/23/20               PT Long Term Goals - 03/03/21 1720       PT LONG TERM GOAL #1   Title Patient reports pain in right shoulder </= 2/10 with activities    Baseline 3/10 at most for the past 7 days (12/22/2020); 1-2/10 at worst, occasional 3/10 (01/06/2021); 02/01/21- 3/10 NPS; 1/10 at most for the past 7 days. (03/03/2021)    Time 8    Period Weeks    Status Achieved    Target Date 03/03/21      PT LONG TERM GOAL #2   Title FOTO >/= 55%    Time 10    Period Weeks    Status Unable to assess    Target Date 01/07/21      PT LONG TERM GOAL #3   Title Right shoulder AROM Flexion & abduction 120* and external & internal rotation 60*    Baseline R shoulder AROM flexion 73 degrees (improved to 95 degrees after session), abduction 55 degrees (improved to 72 degrees after session), ER 26 degrees, IR 37 degrees (rotation ROM at 80-90 degrees abduction position) (12/22/2020); 83 degrees flexion, 61 degrees abduction, 42 degrees ER and IR at 90 degrees abduction (01/06/2021); flexion 128 deg, abduction 95 deg, ER 51 deg, IR 90 deg; flexion 131 degrees, abduction 129 degrees, ER 72 degrees, IR 60 degreees (03/03/2021)    Time 8    Period Weeks    Status Achieved    Target Date 03/03/21      PT LONG TERM GOAL #4   Title Right shoulder  strength >/= 4/5    Baseline R shoulder strength about 3-/5 secondary to limited AROM. (12/22/2020); At available range: ER 4/5, IR 4/5, flexion and abduction  3-/5 (01/06/2021); 02/01/21: ER 4/5 and IR 5/5, Shoulder flexion 4/5, shoulder abduction: 4+/5    Time 8    Period Weeks    Status Achieved    Target Date 02/01/21      PT LONG TERM GOAL #5   Title Pt will improve R shoulder function IR to R thumb to T7 to improve ability to don and doff clothes more comfortably.    Baseline R shoulder functional IR: R thumb to L 1 (03/03/2021)    Time 5    Status New    Target Date 04/07/21                   Plan - 03/03/21 1718     Clinical Impression Statement Pt demonstrates overall decreased R shoulder pain, improved R shoulder AROM all planes, strength, and function. Improved R shoulder functional IR with treatment to decrease soft tissue restrictions anterior shoulder as well as improving intraarticular motion. Pt tolerated session well without aggravation of symptoms. Pt will benefit from continued skilled physical therapy services to decrease pain, improve strength and function.    Personal Factors and Comorbidities Comorbidity 3+    Comorbidities DM2, cervical herniated disc, scoliosis, obesity, HTN,    Examination-Activity Limitations Lift;Carry;Reach Overhead;Sleep    Examination-Participation Restrictions Occupation;Yard Work    Stability/Clinical Decision Making Stable/Uncomplicated    Designer, jewellery Low    Rehab Potential Good    PT Frequency 1x / week    PT Duration Other (comment)   5 weeks   PT Treatment/Interventions ADLs/Self Care Home Management;Cryotherapy;Electrical Stimulation;Moist Heat;Functional mobility training;Therapeutic activities;Therapeutic exercise;Balance training;Neuromuscular re-education;Patient/family education;Manual techniques;Scar mobilization;Passive range of motion;Vasopneumatic Device;Joint Manipulations    PT Next Visit Plan HEP, PROM  & manual therapy to increase ROM; check measurements    PT Home Exercise Plan Access Code: [email or web address]; Medbridge Access Code [email or web address]; Access Code: [email or web address]    Consulted and Agree with Plan of Care Patient             Patient will benefit from skilled therapeutic intervention in order to improve the following deficits and impairments:  Decreased coordination, Decreased endurance, Decreased range of motion, Decreased strength, Increased edema, Increased muscle spasms, Postural dysfunction, Obesity, Pain  Visit Diagnosis: Muscle weakness (generalized) - Plan: PT plan of care cert/re-cert  Stiffness of right shoulder, not elsewhere classified - Plan: PT plan of care cert/re-cert  Acute pain of right shoulder - Plan: PT plan of care cert/re-cert     Problem List Patient Active Problem List   Diagnosis Date Noted   Upper respiratory infection, acute 12/20/2020   Bruised toe 12/20/2020   Traumatic tear of supraspinatus tendon of right shoulder 09/24/2020   Tendinopathy of right biceps tendon 09/24/2020   Traumatic tear of supraspinatus tendon of left shoulder 09/24/2020   Leg swelling 08/02/2020   Dyslipidemia 01/05/2020   Cervical polyp 08/22/2018   Nocturia 08/22/2018   Intertrigo 08/22/2018   Type 2 diabetes mellitus without complication, without long-term current use of insulin ([place]) 08/22/2018   Cervical spine arthritis with nerve pain 06/25/2018   Herniated disc, cervical 06/25/2018   History of migraine 03/25/2018   Scoliosis 03/25/2018   History of cyst of breast 03/25/2018   Sacrum and coccyx fracture, sequela 02/21/2018   Class 3 severe obesity due to excess calories without serious comorbidity with body mass index (BMI) of 45.0 to 49.9 in adult [facility]) 02/21/2018   Cough 02/21/2018   Onychomycosis 02/21/2018   History of hypertension 02/21/2018   Vitamin  D deficiency 03/19/2015   Essential hypertension 03/05/2015   Joneen Boers PT, DPT  03/03/2021, 6:19 PM  [facility] [facility] [address] [place], [address] Phone: [phone]   Fax:  [phone]  Name: Tina Stephenson MRN: [id number] Date of Birth: 1955/01/17

## 2021-03-04 ENCOUNTER — Other Ambulatory Visit: Payer: Self-pay | Admitting: Family Medicine

## 2021-03-04 DIAGNOSIS — E119 Type 2 diabetes mellitus without complications: Secondary | ICD-10-CM

## 2021-03-08 ENCOUNTER — Other Ambulatory Visit: Payer: Self-pay

## 2021-03-08 ENCOUNTER — Ambulatory Visit: Payer: Managed Care, Other (non HMO)

## 2021-03-08 DIAGNOSIS — M6281 Muscle weakness (generalized): Secondary | ICD-10-CM

## 2021-03-08 DIAGNOSIS — M25511 Pain in right shoulder: Secondary | ICD-10-CM

## 2021-03-08 DIAGNOSIS — M25611 Stiffness of right shoulder, not elsewhere classified: Secondary | ICD-10-CM

## 2021-03-08 NOTE — Therapy (Signed)
[facility] [address] [place], [address] Phone: [phone]   Fax:  [phone]  Physical Therapy Treatment  Patient Details  Name: Tina Stephenson MRN: [id number] Date of Birth: 06-11-1955 Referring Provider (PT): Dwana Melena, [place]   Encounter Date: 03/08/2021   PT End of Session - 03/08/21 1703     Visit Number 51    Number of Visits 34    Date for PT Re-Evaluation 04/07/21    Authorization Type [employer] Managed    Authorization Time Period 20% CO-INSURANCE, DED MET, 60 DAYS PER CALENDER YEAR AFTER 5TH VISIT, MEDICAL NESSITY NEEDED/REF#1980    Authorization - Visit Number 105    Authorization - Number of Visits 3    PT Start Time 1703    PT Stop Time 6962    PT Time Calculation (min) 45 min    Activity Tolerance Patient tolerated treatment well    Behavior During Therapy WFL for tasks assessed/performed             Past Medical History:  Diagnosis Date   Bronchitis    Depression    Diabetes mellitus without complication ([place])    Gallbladder polyp    GERD (gastroesophageal reflux disease)    Hypertension    Hypertensive disorder 03/05/2015   Liver tumor (benign)    Sacrum and coccyx fracture ([place])    Sciatica     Past Surgical History:  Procedure Laterality Date   APPENDECTOMY     NASAL SEPTUM SURGERY     WISDOM TOOTH EXTRACTION      There were no vitals filed for this visit.   Subjective Assessment - 03/08/21 1704     Subjective R shoulder is pretty good    Pertinent History Tina Stephenson is a 77yoF referred by Dwana Melena, PA s/p right rotator cuff repair on 10/14/2020 c Dr. Erlinda Hong at [facility] s/p full-thickness tear of Supraspinatus. Pt also has Left supraspinatus full thickness tear, operative management awaiting full recovery from Rt surgery. She was immobilized post procedure, then on 11/17 allowed to DC the shoulder sling.    Patient Stated Goals To move dominant arm, no pain, full  range, return to work    Currently in Pain? No/denies                                        PT Education - 03/08/21 1800     Education Details ther-ex    Person(s) Educated Patient    Methods Explanation;Demonstration;Tactile cues;Verbal cues    Comprehension Returned demonstration;Verbalized understanding               Manual therapy    Seated STM R anterior and posterior shoulder as well as R upper trap to decrease fascial restrictions              R shoulder functional IR AROM improved to R thumb to T11/T12 area.   Seated A to P to R humeral head grade 3 to promote R shoulder IR.             Improved R shoulder functional IR to R thumb to T10     Seated STM R posterior shoulder to decrease muscle tension   Seated STM R distal pectoralis muscle to decrease tension    Therapeutic exercises   Standing R shoulder functional IR R thumb to L1 at start  of session  Wall push-ups  10x2  Wall push-up position:   R UE weight shift to promote posterior glenohumeral joint mobility 10x5 seconds for 2 sets  Standing R shoulder functional IR with gentle long axis distraction 10x3             R shoulder function IR AROM improved   Standing D1 R shoulder extension red band 10x3         Standing R shoulder functional IR with gentle A to P pressure to R humeral head 10x3  Improved AROM    Improved exercise technique, movement at target joints, use of target muscles after mod verbal, visual, tactile cues.         Response to treatment Pt tolerated session well without aggravation of symptoms.      Clinical impression. Improved R shoulder functional IR with treatment to decrease soft tissue restrictions anterior shoulder as well as improving intraarticular motion (improving posterior and inferior glide of humeral head).  Pt tolerated session well without aggravation of symptoms. Pt will benefit from continued skilled physical therapy services to  decrease pain, improve strength and function.       PT Short Term Goals - 01/06/21 1219       PT SHORT TERM GOAL #1   Title FOTO improved to 35%    Baseline Unable to access FOTO information and retrieve questionnaire for pt to fill out. (11/22/2020)    Time 4    Period Weeks    Status On-going    Target Date 11/23/20      PT SHORT TERM GOAL #2   Title right shoulder PROM flexion & abduction 100*    Baseline Supine AAROM R shoulder 115 degrees flexion, 91 degrees abduction (11/22/2020); supine AAROM R shoulder flexion 120 degrees, abduction 97 degrees (12/22/2020); 131 degrees flexion AAROM, 100 degrees abduction (01/06/2021)    Time 4    Period Weeks    Status Achieved    Target Date 11/23/20      PT SHORT TERM GOAL #3   Title Patient is independent with initial HEP; No questions after reviewed and redemonstrated today (11/22/2020)    Time 4    Period Weeks    Status Achieved    Target Date 11/23/20               PT Long Term Goals - 03/03/21 1720       PT LONG TERM GOAL #1   Title Patient reports pain in right shoulder </= 2/10 with activities    Baseline 3/10 at most for the past 7 days (12/22/2020); 1-2/10 at worst, occasional 3/10 (01/06/2021); 02/01/21- 3/10 NPS; 1/10 at most for the past 7 days. (03/03/2021)    Time 8    Period Weeks    Status Achieved    Target Date 03/03/21      PT LONG TERM GOAL #2   Title FOTO >/= 55%    Time 10    Period Weeks    Status Unable to assess    Target Date 01/07/21      PT LONG TERM GOAL #3   Title Right shoulder AROM Flexion & abduction 120* and external & internal rotation 60*    Baseline R shoulder AROM flexion 73 degrees (improved to 95 degrees after session), abduction 55 degrees (improved to 72 degrees after session), ER 26 degrees, IR 37 degrees (rotation ROM at 80-90 degrees abduction position) (12/22/2020); 83 degrees flexion, 61 degrees abduction, 42 degrees ER and IR  at 90 degrees abduction (01/06/2021);  flexion 128 deg, abduction 95 deg, ER 51 deg, IR 90 deg; flexion 131 degrees, abduction 129 degrees, ER 72 degrees, IR 60 degreees (03/03/2021)    Time 8    Period Weeks    Status Achieved    Target Date 03/03/21      PT LONG TERM GOAL #4   Title Right shoulder strength >/= 4/5    Baseline R shoulder strength about 3-/5 secondary to limited AROM. (12/22/2020); At available range: ER 4/5, IR 4/5, flexion and abduction 3-/5 (01/06/2021); 02/01/21: ER 4/5 and IR 5/5, Shoulder flexion 4/5, shoulder abduction: 4+/5    Time 8    Period Weeks    Status Achieved    Target Date 02/01/21      PT LONG TERM GOAL #5   Title Pt will improve R shoulder function IR to R thumb to T7 to improve ability to don and doff clothes more comfortably.    Baseline R shoulder functional IR: R thumb to L 1 (03/03/2021)    Time 5    Status New    Target Date 04/07/21                   Plan - 03/08/21 1701     Clinical Impression Statement Improved R shoulder functional IR with treatment to decrease soft tissue restrictions anterior shoulder as well as improving intraarticular motion (improving posterior and inferior glide of humeral head).  Pt tolerated session well without aggravation of symptoms. Pt will benefit from continued skilled physical therapy services to decrease pain, improve strength and function.    Personal Factors and Comorbidities Comorbidity 3+    Comorbidities DM2, cervical herniated disc, scoliosis, obesity, HTN,    Examination-Activity Limitations Lift;Carry;Reach Overhead;Sleep    Examination-Participation Restrictions Occupation;Yard Work    Stability/Clinical Decision Making Stable/Uncomplicated    Designer, jewellery Low    Rehab Potential Good    PT Frequency 1x / week    PT Duration Other (comment)   5 weeks   PT Treatment/Interventions ADLs/Self Care Home Management;Cryotherapy;Electrical Stimulation;Moist Heat;Functional mobility training;Therapeutic  activities;Therapeutic exercise;Balance training;Neuromuscular re-education;Patient/family education;Manual techniques;Scar mobilization;Passive range of motion;Vasopneumatic Device;Joint Manipulations    PT Next Visit Plan HEP, PROM & manual therapy to increase ROM; check measurements    PT Home Exercise Plan Access Code: [email or web address]; Medbridge Access Code [email or web address]; Access Code: [email or web address]    Consulted and Agree with Plan of Care Patient             Patient will benefit from skilled therapeutic intervention in order to improve the following deficits and impairments:  Decreased coordination, Decreased endurance, Decreased range of motion, Decreased strength, Increased edema, Increased muscle spasms, Postural dysfunction, Obesity, Pain  Visit Diagnosis: Muscle weakness (generalized)  Stiffness of right shoulder, not elsewhere classified  Acute pain of right shoulder     Problem List Patient Active Problem List   Diagnosis Date Noted   Upper respiratory infection, acute 12/20/2020   Bruised toe 12/20/2020   Traumatic tear of supraspinatus tendon of right shoulder 09/24/2020   Tendinopathy of right biceps tendon 09/24/2020   Traumatic tear of supraspinatus tendon of left shoulder 09/24/2020   Leg swelling 08/02/2020   Dyslipidemia 01/05/2020   Cervical polyp 08/22/2018   Nocturia 08/22/2018   Intertrigo 08/22/2018   Type 2 diabetes mellitus without complication, without long-term current use of insulin ([place]) 08/22/2018   Cervical spine arthritis with nerve pain 06/25/2018   Herniated disc, cervical 06/25/2018   History  of migraine 03/25/2018   Scoliosis 03/25/2018   History of cyst of breast 03/25/2018   Sacrum and coccyx fracture, sequela 02/21/2018   Class 3 severe obesity due to excess calories without serious comorbidity with body mass index (BMI) of 45.0 to 49.9 in adult [facility]) 02/21/2018   Cough 02/21/2018   Onychomycosis 02/21/2018   History of hypertension 02/21/2018    Vitamin D deficiency 03/19/2015   Essential hypertension 03/05/2015    Joneen Boers PT, DPT  03/08/2021, 6:04 PM  [facility] [address], [place], [address] Phone: [phone]   Fax:  [phone]  Name: Tina Stephenson MRN: [id number] Date of Birth: 30-Mar-1955

## 2021-03-14 ENCOUNTER — Other Ambulatory Visit: Payer: Self-pay

## 2021-03-14 ENCOUNTER — Ambulatory Visit: Payer: Managed Care, Other (non HMO) | Attending: Physician Assistant

## 2021-03-14 DIAGNOSIS — M6281 Muscle weakness (generalized): Secondary | ICD-10-CM | POA: Diagnosis not present

## 2021-03-14 DIAGNOSIS — R600 Localized edema: Secondary | ICD-10-CM | POA: Diagnosis present

## 2021-03-14 DIAGNOSIS — M25511 Pain in right shoulder: Secondary | ICD-10-CM | POA: Diagnosis present

## 2021-03-14 DIAGNOSIS — R293 Abnormal posture: Secondary | ICD-10-CM | POA: Diagnosis present

## 2021-03-14 DIAGNOSIS — M25611 Stiffness of right shoulder, not elsewhere classified: Secondary | ICD-10-CM | POA: Diagnosis present

## 2021-03-14 NOTE — Patient Instructions (Signed)
Access Code: [email or web address] ?URL: [email or web address] ?Date: 03/14/2021 ?Prepared by: Joneen Boers ? ?Exercises ?Chest and Bicep Stretch - Arms Behind Back - 1 x daily - 7 x weekly - 1 sets - 3 reps - 20 hold ?Doorway Pec Stretch at 90 Degrees Abduction - 1 x daily - 7 x weekly - 1 sets - 3 reps - 30 hold ?Isometric Tricep Extension - 1 x daily - 7 x weekly - 3 sets - 10 reps - 5 seconds hold ?Single Arm Shoulder Extension with Anchored Resistance - 1 x daily - 7 x weekly - 1-3 sets - 10 reps - 5 seconds hold ?Seated Shoulder Inferior Glide - 1 x daily - 7 x weekly - 3 sets - 10 reps - 5 seconds hold ?Shoulder External Rotation and Scapular Retraction with Resistance - 1 x daily - 7 x weekly - 2 sets - 10 reps ? ?

## 2021-03-14 NOTE — Therapy (Signed)
[facility] [address] [place], [address] Phone: [phone]   Fax:  [phone]  Physical Therapy Treatment And Progress Report  (02/01/2021 - 03/14/2021)  Patient Details  Name: Tina Stephenson MRN: [id number] Date of Birth: [birth year] Referring Provider (PT): Dwana Melena, [place]   Encounter Date: 03/14/2021   PT End of Session - 03/14/21 1106     Visit Number 40    Number of Visits 45    Date for PT Re-Evaluation 04/07/21    Authorization Type [employer] Managed    Authorization Time Period 20% CO-INSURANCE, DED MET, 60 DAYS PER CALENDER YEAR AFTER 5TH VISIT, MEDICAL NESSITY NEEDED/REF#[id number]    Authorization - Visit Number 81    Authorization - Number of Visits 43    PT Start Time 1106    PT Stop Time 1147    PT Time Calculation (min) 41 min    Activity Tolerance Patient tolerated treatment well    Behavior During Therapy WFL for tasks assessed/performed             Past Medical History:  Diagnosis Date   Bronchitis    Depression    Diabetes mellitus without complication ([place])    Gallbladder polyp    GERD (gastroesophageal reflux disease)    Hypertension    Hypertensive disorder 03/05/2015   Liver tumor (benign)    Sacrum and coccyx fracture ([place])    Sciatica     Past Surgical History:  Procedure Laterality Date   APPENDECTOMY     NASAL SEPTUM SURGERY     WISDOM TOOTH EXTRACTION      There were no vitals filed for this visit.   Subjective Assessment - 03/14/21 1107     Subjective R shoulder is good. No pain currently.    Pertinent History Dawn Convery is a 42yoF referred by Dwana Melena, PA s/p right rotator cuff repair on 10/14/2020 c Dr. Erlinda Hong at [facility] s/p full-thickness tear of Supraspinatus. Pt also has Left supraspinatus full thickness tear, operative management awaiting full recovery from Rt surgery. She was immobilized post procedure, then on 11/17 allowed to DC the shoulder sling.     Patient Stated Goals To move dominant arm, no pain, full range, return to work    Currently in Pain? No/denies    Pain Score 0-No pain                                        PT Education - 03/14/21 1154     Education Details ther-ex, HEP    Person(s) Educated Patient    Methods Explanation;Demonstration;Tactile cues;Verbal cues;Handout    Comprehension Verbalized understanding;Returned demonstration            Manual therapy   Seated A to P to R humeral head grade 3 to promote R shoulder IR.  Seated STM R anterior and posterior shoulder  to decrease fascial restrictions            Therapeutic exercises  Standing R shooulder functional IR at start of session  R thumb to T12  Seated self R shoulder inferior glide 10x5 seconds for 3 sets  Functional R shoulder IR improved to R thumb to T10.   Standing R shoulder functional IR with gentle long axis distraction 10x3              Standing R shoulder functional IR  with gentle A to P pressure to R humeral head 10x3    Improved function IR to thumb to T8 spinous process   Standing R shoulder ER yellow band 10x2  Standing D1 R shoulder extension red band 10x3          Standing R shoulder AROM   Flexion 135 degrees  Abduction 137 degrees     Improved exercise technique, movement at target joints, use of target muscles after mod verbal, visual, tactile cues.         Response to treatment Pt tolerated session well without aggravation of symptoms.      Clinical impression. Pt demonstrates overall improved R shoulder AROM, strength, and function. Improved R shoulder functional IR AROM to R thumb to T8 spinous process after treatment to promote posterior and inferior R humeral head movement. Pt tolerated session well without aggravation of symptoms. Pt will benefit from continued skilled physical therapy services to decrease pain, improve strength and function.       PT Short Term Goals -  01/06/21 1219       PT SHORT TERM GOAL #1   Title FOTO improved to 35%    Baseline Unable to access FOTO information and retrieve questionnaire for pt to fill out. (11/22/2020)    Time 4    Period Weeks    Status On-going    Target Date 11/23/20      PT SHORT TERM GOAL #2   Title right shoulder PROM flexion & abduction 100*    Baseline Supine AAROM R shoulder 115 degrees flexion, 91 degrees abduction (11/22/2020); supine AAROM R shoulder flexion 120 degrees, abduction 97 degrees (12/22/2020); 131 degrees flexion AAROM, 100 degrees abduction (01/06/2021)    Time 4    Period Weeks    Status Achieved    Target Date 11/23/20      PT SHORT TERM GOAL #3   Title Patient is independent with initial HEP; No questions after reviewed and redemonstrated today (11/22/2020)    Time 4    Period Weeks    Status Achieved    Target Date 11/23/20               PT Long Term Goals - 03/14/21 1152       PT LONG TERM GOAL #1   Title Patient reports pain in right shoulder </= 2/10 with activities    Baseline 3/10 at most for the past 7 days (12/22/2020); 1-2/10 at worst, occasional 3/10 (01/06/2021); 02/01/21- 3/10 NPS; 1/10 at most for the past 7 days. (03/03/2021)    Time 8    Period Weeks    Status Achieved    Target Date 03/03/21      PT LONG TERM GOAL #2   Title FOTO >/= 55%    Time 10    Period Weeks    Status Unable to assess    Target Date 01/07/21      PT LONG TERM GOAL #3   Title Right shoulder AROM Flexion & abduction 120* and external & internal rotation 60*    Baseline R shoulder AROM flexion 73 degrees (improved to 95 degrees after session), abduction 55 degrees (improved to 72 degrees after session), ER 26 degrees, IR 37 degrees (rotation ROM at 80-90 degrees abduction position) (12/22/2020); 83 degrees flexion, 61 degrees abduction, 42 degrees ER and IR at 90 degrees abduction (01/06/2021); flexion 128 deg, abduction 95 deg, ER 51 deg, IR 90 deg; flexion 131 degrees,  abduction 129 degrees, ER 72  degrees, IR 60 degreees (03/03/2021)    Time 8    Period Weeks    Status Achieved    Target Date 03/03/21      PT LONG TERM GOAL #4   Title Right shoulder strength >/= 4/5    Baseline R shoulder strength about 3-/5 secondary to limited AROM. (12/22/2020); At available range: ER 4/5, IR 4/5, flexion and abduction 3-/5 (01/06/2021); 02/01/21: ER 4/5 and IR 5/5, Shoulder flexion 4/5, shoulder abduction: 4+/5    Time 8    Period Weeks    Status Achieved    Target Date 02/01/21      PT LONG TERM GOAL #5   Title Pt will improve R shoulder function IR to R thumb to T7 to improve ability to don and doff clothes more comfortably.    Baseline R shoulder functional IR: R thumb to L 1 (03/03/2021); R thumb to T8 spinous process after treatment (03/14/2021)    Time 5    Status Partially Met    Target Date 04/07/21                   Plan - 03/14/21 1152     Clinical Impression Statement Pt demonstrates overall improved R shoulder AROM, strength, and function. Improved R shoulder functional IR AROM to R thumb to T8 spinous process after treatment to promote posterior and inferior R humeral head movement. Pt tolerated session well without aggravation of symptoms. Pt will benefit from continued skilled physical therapy services to decrease pain, improve strength and function.    Personal Factors and Comorbidities Comorbidity 3+    Comorbidities DM2, cervical herniated disc, scoliosis, obesity, HTN,    Examination-Activity Limitations Lift;Carry;Reach Overhead;Sleep    Examination-Participation Restrictions Occupation;Yard Work    Stability/Clinical Decision Making Stable/Uncomplicated    Designer, jewellery Low    Rehab Potential Good    PT Frequency 1x / week    PT Duration Other (comment)   5 weeks   PT Treatment/Interventions ADLs/Self Care Home Management;Cryotherapy;Electrical Stimulation;Moist Heat;Functional mobility training;Therapeutic  activities;Therapeutic exercise;Balance training;Neuromuscular re-education;Patient/family education;Manual techniques;Scar mobilization;Passive range of motion;Vasopneumatic Device;Joint Manipulations    PT Next Visit Plan HEP, PROM & manual therapy to increase ROM; check measurements    PT Home Exercise Plan Access Code: [email or web address]; Medbridge Access Code [email or web address]; Access Code: [email or web address]    Consulted and Agree with Plan of Care Patient             Patient will benefit from skilled therapeutic intervention in order to improve the following deficits and impairments:  Decreased coordination, Decreased endurance, Decreased range of motion, Decreased strength, Increased edema, Increased muscle spasms, Postural dysfunction, Obesity, Pain  Visit Diagnosis: Muscle weakness (generalized)  Stiffness of right shoulder, not elsewhere classified  Acute pain of right shoulder  Abnormal posture     Problem List Patient Active Problem List   Diagnosis Date Noted   Upper respiratory infection, acute 12/20/2020   Bruised toe 12/20/2020   Traumatic tear of supraspinatus tendon of right shoulder 09/24/2020   Tendinopathy of right biceps tendon 09/24/2020   Traumatic tear of supraspinatus tendon of left shoulder 09/24/2020   Leg swelling 08/02/2020   Dyslipidemia 01/05/2020   Cervical polyp 08/22/2018   Nocturia 08/22/2018   Intertrigo 08/22/2018   Type 2 diabetes mellitus without complication, without long-term current use of insulin ([place]) 08/22/2018   Cervical spine arthritis with nerve pain 06/25/2018   Herniated disc, cervical 06/25/2018   History of migraine 03/25/2018   Scoliosis 03/25/2018  History of cyst of breast 03/25/2018   Sacrum and coccyx fracture, sequela 02/21/2018   Class 3 severe obesity due to excess calories without serious comorbidity with body mass index (BMI) of 45.0 to 49.9 in adult [facility]) 02/21/2018   Cough 02/21/2018   Onychomycosis 02/21/2018   History of  hypertension 02/21/2018   Vitamin D deficiency 03/19/2015   Essential hypertension 03/05/2015   Thank you for your referral.  Joneen Boers DPT  03/14/2021, 1:21 PM  [facility] [address] [place], [address] Phone: [phone]   Fax:  [phone]  Name: Tina Stephenson MRN: [id number] Date of Birth: Jul 19, 1955

## 2021-03-21 ENCOUNTER — Other Ambulatory Visit: Payer: Self-pay

## 2021-03-21 ENCOUNTER — Ambulatory Visit: Payer: Managed Care, Other (non HMO)

## 2021-03-21 DIAGNOSIS — M25511 Pain in right shoulder: Secondary | ICD-10-CM

## 2021-03-21 DIAGNOSIS — M6281 Muscle weakness (generalized): Secondary | ICD-10-CM | POA: Diagnosis not present

## 2021-03-21 DIAGNOSIS — R293 Abnormal posture: Secondary | ICD-10-CM

## 2021-03-21 DIAGNOSIS — M25611 Stiffness of right shoulder, not elsewhere classified: Secondary | ICD-10-CM

## 2021-03-21 NOTE — Therapy (Signed)
[facility] [address], [place], [address] Phone: [phone]   Fax:  [phone]  Physical Therapy Treatment  Patient Details  Name: Tina Stephenson MRN: [id number] Date of Birth: 03/11/1955 Referring Provider (PT): Dwana Melena, [place]   Encounter Date: 03/21/2021   PT End of Session - 03/21/21 1101     Visit Number 41    Number of Visits 38    Date for PT Re-Evaluation 04/07/21    Authorization Type [employer] Managed    Authorization Time Period 20% CO-INSURANCE, DED MET, 60 DAYS PER CALENDER YEAR AFTER 5TH VISIT, MEDICAL NESSITY NEEDED/REF#1980    Authorization - Visit Number 76    Authorization - Number of Visits 29    PT Start Time 1101    PT Stop Time 1142    PT Time Calculation (min) 41 min    Activity Tolerance Patient tolerated treatment well    Behavior During Therapy WFL for tasks assessed/performed             Past Medical History:  Diagnosis Date   Bronchitis    Depression    Diabetes mellitus without complication ([place])    Gallbladder polyp    GERD (gastroesophageal reflux disease)    Hypertension    Hypertensive disorder 03/05/2015   Liver tumor (benign)    Sacrum and coccyx fracture ([place])    Sciatica     Past Surgical History:  Procedure Laterality Date   APPENDECTOMY     NASAL SEPTUM SURGERY     WISDOM TOOTH EXTRACTION      There were no vitals filed for this visit.   Subjective Assessment - 03/21/21 1102     Subjective R shoulder is good. Reaching behind her back is getting easier.    Pertinent History Tina Stephenson is a 70yoF referred by Dwana Melena, PA s/p right rotator cuff repair on 10/14/2020 c Dr. Erlinda Hong at [facility] s/p full-thickness tear of Supraspinatus. Pt also has Left supraspinatus full thickness tear, operative management awaiting full recovery from Rt surgery. She was immobilized post procedure, then on 11/17 allowed to DC the shoulder sling.    Patient Stated  Goals To move dominant arm, no pain, full range, return to work    Currently in Pain? No/denies                                        PT Education - 03/21/21 1114     Education Details ther-ex    Person(s) Educated Patient    Methods Explanation;Demonstration;Tactile cues;Verbal cues    Comprehension Returned demonstration;Verbalized understanding              Manual therapy   Seated A to P to R humeral head grade 3 to promote R shoulder IR.   Seated STM R anterior and posterior shoulder  to decrease fascial restrictions          Therapeutic exercises   Standing R shoulder functional IR with gentle long axis distraction 10x3              Standing R shoulder functional IR with gentle A to P pressure to R humeral head 10x3   Seated self R shoulder inferior glide 10x5 seconds for 3 sets             Seated B scapular retraction and chin tucks to promote upper thoracic extension  and scapular retraction.   Standing R shoulder ER yellow band 10x3   Standing D1 R shoulder extension red band 10x3          Seated R triceps extension isometrics, hand on table 10x5 seconds for 3 sets  Seated manually resisted scapular retraction targeting the lower trap muscle  R 10x5 seconds for 3 sets     Improved exercise technique, movement at target joints, use of target muscles after mod verbal, visual, tactile cues.         Response to treatment Pt tolerated session well without aggravation of symptoms.      Clinical impression. Improving overall ability to reach behind her back based on subjective reports. Continued working on improving R glenohumeral joint space via exercises as well as improving scapular strength and posterior glide of humeral head to promote ability to reach in all directions more comfortably. Pt tolerated session well without aggravation of symptoms. Pt will benefit from continued skilled physical therapy services to decrease  pain, improve strength and function.        PT Short Term Goals - 01/06/21 1219       PT SHORT TERM GOAL #1   Title FOTO improved to 35%    Baseline Unable to access FOTO information and retrieve questionnaire for pt to fill out. (11/22/2020)    Time 4    Period Weeks    Status On-going    Target Date 11/23/20      PT SHORT TERM GOAL #2   Title right shoulder PROM flexion & abduction 100*    Baseline Supine AAROM R shoulder 115 degrees flexion, 91 degrees abduction (11/22/2020); supine AAROM R shoulder flexion 120 degrees, abduction 97 degrees (12/22/2020); 131 degrees flexion AAROM, 100 degrees abduction (01/06/2021)    Time 4    Period Weeks    Status Achieved    Target Date 11/23/20      PT SHORT TERM GOAL #3   Title Patient is independent with initial HEP; No questions after reviewed and redemonstrated today (11/22/2020)    Time 4    Period Weeks    Status Achieved    Target Date 11/23/20               PT Long Term Goals - 03/14/21 1152       PT LONG TERM GOAL #1   Title Patient reports pain in right shoulder </= 2/10 with activities    Baseline 3/10 at most for the past 7 days (12/22/2020); 1-2/10 at worst, occasional 3/10 (01/06/2021); 02/01/21- 3/10 NPS; 1/10 at most for the past 7 days. (03/03/2021)    Time 8    Period Weeks    Status Achieved    Target Date 03/03/21      PT LONG TERM GOAL #2   Title FOTO >/= 55%    Time 10    Period Weeks    Status Unable to assess    Target Date 01/07/21      PT LONG TERM GOAL #3   Title Right shoulder AROM Flexion & abduction 120* and external & internal rotation 60*    Baseline R shoulder AROM flexion 73 degrees (improved to 95 degrees after session), abduction 55 degrees (improved to 72 degrees after session), ER 26 degrees, IR 37 degrees (rotation ROM at 80-90 degrees abduction position) (12/22/2020); 83 degrees flexion, 61 degrees abduction, 42 degrees ER and IR at 90 degrees abduction (01/06/2021); flexion 128  deg, abduction 95 deg, ER 51 deg, IR  90 deg; flexion 131 degrees, abduction 129 degrees, ER 72 degrees, IR 60 degreees (03/03/2021)    Time 8    Period Weeks    Status Achieved    Target Date 03/03/21      PT LONG TERM GOAL #4   Title Right shoulder strength >/= 4/5    Baseline R shoulder strength about 3-/5 secondary to limited AROM. (12/22/2020); At available range: ER 4/5, IR 4/5, flexion and abduction 3-/5 (01/06/2021); 02/01/21: ER 4/5 and IR 5/5, Shoulder flexion 4/5, shoulder abduction: 4+/5    Time 8    Period Weeks    Status Achieved    Target Date 02/01/21      PT LONG TERM GOAL #5   Title Pt will improve R shoulder function IR to R thumb to T7 to improve ability to don and doff clothes more comfortably.    Baseline R shoulder functional IR: R thumb to L 1 (03/03/2021); R thumb to T8 spinous process after treatment (03/14/2021)    Time 5    Status Partially Met    Target Date 04/07/21                   Plan - 03/21/21 1100     Personal Factors and Comorbidities Comorbidity 3+    Comorbidities DM2, cervical herniated disc, scoliosis, obesity, HTN,    Examination-Activity Limitations Lift;Carry;Reach Overhead;Sleep    Examination-Participation Restrictions Occupation;Yard Work    Stability/Clinical Decision Making Stable/Uncomplicated    Rehab Potential Good    PT Frequency 1x / week    PT Duration Other (comment)   5 weeks   PT Treatment/Interventions ADLs/Self Care Home Management;Cryotherapy;Electrical Stimulation;Moist Heat;Functional mobility training;Therapeutic activities;Therapeutic exercise;Balance training;Neuromuscular re-education;Patient/family education;Manual techniques;Scar mobilization;Passive range of motion;Vasopneumatic Device;Joint Manipulations    PT Next Visit Plan HEP, PROM & manual therapy to increase ROM; check measurements    PT Home Exercise Plan Access Code: [email or web address]; Medbridge Access Code [email or web address]; Access Code: [email or web address]    Consulted and  Agree with Plan of Care Patient             Patient will benefit from skilled therapeutic intervention in order to improve the following deficits and impairments:  Decreased coordination, Decreased endurance, Decreased range of motion, Decreased strength, Increased edema, Increased muscle spasms, Postural dysfunction, Obesity, Pain  Visit Diagnosis: Muscle weakness (generalized)  Stiffness of right shoulder, not elsewhere classified  Acute pain of right shoulder  Abnormal posture     Problem List Patient Active Problem List   Diagnosis Date Noted   Upper respiratory infection, acute 12/20/2020   Bruised toe 12/20/2020   Traumatic tear of supraspinatus tendon of right shoulder 09/24/2020   Tendinopathy of right biceps tendon 09/24/2020   Traumatic tear of supraspinatus tendon of left shoulder 09/24/2020   Leg swelling 08/02/2020   Dyslipidemia 01/05/2020   Cervical polyp 08/22/2018   Nocturia 08/22/2018   Intertrigo 08/22/2018   Type 2 diabetes mellitus without complication, without long-term current use of insulin ([place]) 08/22/2018   Cervical spine arthritis with nerve pain 06/25/2018   Herniated disc, cervical 06/25/2018   History of migraine 03/25/2018   Scoliosis 03/25/2018   History of cyst of breast 03/25/2018   Sacrum and coccyx fracture, sequela 02/21/2018   Class 3 severe obesity due to excess calories without serious comorbidity with body mass index (BMI) of 45.0 to 49.9 in adult [facility]) 02/21/2018   Cough 02/21/2018   Onychomycosis 02/21/2018   History of hypertension 02/21/2018   Vitamin D deficiency 03/19/2015  Essential hypertension 03/05/2015    Joneen Boers PT, DPT  03/21/2021, 2:14 PM  [facility] [address], [place], [address] Phone: [phone]   Fax:  [phone]  Name: Tina Stephenson MRN: [id number] Date of Birth: 03/14/1955

## 2021-03-28 ENCOUNTER — Ambulatory Visit: Payer: Managed Care, Other (non HMO)

## 2021-03-28 ENCOUNTER — Other Ambulatory Visit: Payer: Self-pay

## 2021-03-28 DIAGNOSIS — M6281 Muscle weakness (generalized): Secondary | ICD-10-CM | POA: Diagnosis not present

## 2021-03-28 DIAGNOSIS — M25611 Stiffness of right shoulder, not elsewhere classified: Secondary | ICD-10-CM

## 2021-03-28 NOTE — Therapy (Signed)
? ?  ? ?OUTPATIENT PHYSICAL THERAPY TREATMENT NOTE ? ? ?Patient Name: Tina Stephenson ?MRN: [id number] ?DOB:Dec 30, 1955, 66 y.o., female ?Today's Date: 03/28/2021 ? ?PCP: Delsa Grana, PA-C ?REFERRING PROVIDER: Aundra Dubin, PA-C ? ? ? PT End of Session - 03/28/21 1716   ? ? Visit Number 42   ? Number of Visits 45   ? Date for PT Re-Evaluation 04/07/21   ? Authorization Type [employer] Managed   ? Authorization Time Period 20% CO-INSURANCE, DED MET, 60 DAYS PER CALENDER YEAR AFTER 5TH VISIT, MEDICAL NESSITY NEEDED/REF#[id number]   ? Authorization - Visit Number 17   ? Authorization - Number of Visits 26   ? PT Start Time 0175   ? PT Stop Time 1806   ? PT Time Calculation (min) 49 min   ? Activity Tolerance Patient tolerated treatment well   ? Behavior During Therapy [facility] for tasks assessed/performed   ? ?  ?  ? ?  ? ? ?Past Medical History:  ?Diagnosis Date  ? Bronchitis   ? Depression   ? Diabetes mellitus without complication ([place])   ? Gallbladder polyp   ? GERD (gastroesophageal reflux disease)   ? Hypertension   ? Hypertensive disorder 03/05/2015  ? Liver tumor (benign)   ? Sacrum and coccyx fracture ([place])   ? Sciatica   ? ?Past Surgical History:  ?Procedure Laterality Date  ? APPENDECTOMY    ? NASAL SEPTUM SURGERY    ? WISDOM TOOTH EXTRACTION    ? ?Patient Active Problem List  ? Diagnosis Date Noted  ? Upper respiratory infection, acute 12/20/2020  ? Bruised toe 12/20/2020  ? Traumatic tear of supraspinatus tendon of right shoulder 09/24/2020  ? Tendinopathy of right biceps tendon 09/24/2020  ? Traumatic tear of supraspinatus tendon of left shoulder 09/24/2020  ? Leg swelling 08/02/2020  ? Dyslipidemia 01/05/2020  ? Cervical polyp 08/22/2018  ? Nocturia 08/22/2018  ? Intertrigo 08/22/2018  ? Type 2 diabetes mellitus without complication, without long-term current use of insulin ([place]) 08/22/2018  ? Cervical spine arthritis with nerve pain 06/25/2018  ? Herniated disc, cervical 06/25/2018  ? History of migraine 03/25/2018  ?  Scoliosis 03/25/2018  ? History of cyst of breast 03/25/2018  ? Sacrum and coccyx fracture, sequela 02/21/2018  ? Class 3 severe obesity due to excess calories without serious comorbidity with body mass index (BMI) of 45.0 to 49.9 in adult [facility]) 02/21/2018  ? Cough 02/21/2018  ? Onychomycosis 02/21/2018  ? History of hypertension 02/21/2018  ? Vitamin D deficiency 03/19/2015  ? Essential hypertension 03/05/2015  ? ? ?REFERRING DIAG: Z98.890 (ICD-10-CM) - S/P right rotator cuff repair ? ?THERAPY DIAG:  ?Muscle weakness (generalized) ? ?Stiffness of right shoulder, not elsewhere classified ? ?PERTINENT HISTORY: Shravya Wickwire is a 11yoF referred by Dwana Melena, PA s/p right rotator cuff repair on 10/14/2020 c Dr. Erlinda Hong at [facility] s/p full-thickness tear of Supraspinatus. Pt also has Left supraspinatus full thickness tear, operative management awaiting full recovery from Rt surgery. She was immobilized post procedure, then on 11/17 allowed to DC the shoulder sling. ? ?PRECAUTIONS: No known precautions ? ?SUBJECTIVE: Still has a hard time reaching behind her back, still feels tight.  ? ?PAIN:  ?Are you having pain? No ? ? ? ? ?TODAY'S TREATMENT:  ? ?Manual therapy ?  ?Seated STM R anterior and posterior shoulder  to decrease fascial restrictions  ?     ?Seated A to P to R humeral head grade 3 to promote R shoulder IR. ? ? ? ? ?    ?  Therapeutic exercises ? ?R shoulder functional IR at start of session: R thumb to L 1 spinous process ? ?Seated self R shoulder inferior glide 30x5 seconds ? ?Standing R shoulder extension with reaching behind back yellow band 10x3 ? Slight increase in tightness with functional IR  ? ? ?Standing D1 R shoulder extension red band 10x3. Easy  ? Then with green band 5x2. More challenging ? ? ?Standing R shoulder IR isometrics, hand on abdomen (to target subscapularis muscle) 10x3 with 5 second holds  ? Improved R shoulder fucntional IR to thumb to T12 spinous process.  ? Reviewed and given as part  of her HEP. Pt verbalized understanding.  ? ?Standing R shoulder extension with scapular retraction green band 10x3 ? Increased R anterior arm tightness with functional R shoulder IR ? ? ?Standing R shoulder functional IR with gentle long axis distraction 10x3 ?            improved ease of R shoulder functional IR afterwards  ? ?Standing R shoulder function IR holding 2 lbs 3x. R anterior shoulder pulling.  ? ? ?  ? ?Improved exercise technique, movement at target joints, use of target muscles after mod verbal, visual, tactile cues.  ?  ?   ? ? ?PATIENT EDUCATION: ?Education details: there-ex ?Person educated: Patient ?Education method: Explanation, Demonstration, Tactile cues, and Verbal cues ?Education comprehension: verbalized understanding and returned demonstration ? ? ?HOME EXERCISE PROGRAM: ?Access Code: [email or web address] ?URL: [email or web address] ?Date: 03/28/2021 ?Prepared by: Joneen Boers ? ?Exercises ?Chest and Bicep Stretch - Arms Behind Back - 1 x daily - 7 x weekly - 1 sets - 3 reps - 20 hold ?Doorway Pec Stretch at 90 Degrees Abduction - 1 x daily - 7 x weekly - 1 sets - 3 reps - 30 hold ?Isometric Tricep Extension - 1 x daily - 7 x weekly - 3 sets - 10 reps - 5 seconds hold ?Single Arm Shoulder Extension with Anchored Resistance - 1 x daily - 7 x weekly - 1-3 sets - 10 reps - 5 seconds hold ?Seated Shoulder Inferior Glide - 1 x daily - 7 x weekly - 3 sets - 10 reps - 5 seconds hold ?Shoulder External Rotation and Scapular Retraction with Resistance - 1 x daily - 7 x weekly - 2 sets - 10 reps ? ? ? ?Standing R shoulder IR isometrics, hand on abdomen (to target subscapularis muscle) 10x3 with 5 second holds  ? Improved R shoulder fucntional IR to thumb to T12 spinous process.  ? Reviewed and given as part of her HEP. Pt verbalized understanding.  ? ? ? ? ? ? ?  ?Response to treatment ?Pt tolerated session well without aggravation of symptoms.  ?  ?  ?Clinical impression. ?Continued working on  improving posterior and inferior R glenohumeral head movement to promote ability to reach behind her back with less difficulty. Pt tolerated session well without aggravation of symptoms. Pt will benefit from continued skilled physical therapy services to decrease pain, improve strength and function.  ?  ? ? ? ? PT Short Term Goals - 01/06/21 1219   ? ?  ? PT SHORT TERM GOAL #1  ? Title FOTO improved to 35%   ? Baseline Unable to access FOTO information and retrieve questionnaire for pt to fill out. (11/22/2020)   ? Time 4   ? Period Weeks   ? Status On-going   ? Target Date 11/23/20   ?  ? PT SHORT TERM GOAL #2  ?  Title right shoulder PROM flexion & abduction 100*   ? Baseline Supine AAROM R shoulder 115 degrees flexion, 91 degrees abduction (11/22/2020); supine AAROM R shoulder flexion 120 degrees, abduction 97 degrees (12/22/2020); 131 degrees flexion AAROM, 100 degrees abduction (01/06/2021)   ? Time 4   ? Period Weeks   ? Status Achieved   ? Target Date 11/23/20   ?  ? PT SHORT TERM GOAL #3  ? Title Patient is independent with initial HEP; No questions after reviewed and redemonstrated today (11/22/2020)   ? Time 4   ? Period Weeks   ? Status Achieved   ? Target Date 11/23/20   ? ?  ?  ? ?  ? ? ? ? PT Long Term Goals - 03/14/21 1152   ? ?  ? PT LONG TERM GOAL #1  ? Title Patient reports pain in right shoulder </= 2/10 with activities   ? Baseline 3/10 at most for the past 7 days (12/22/2020); 1-2/10 at worst, occasional 3/10 (01/06/2021); 02/01/21- 3/10 NPS; 1/10 at most for the past 7 days. (03/03/2021)   ? Time 8   ? Period Weeks   ? Status Achieved   ? Target Date 03/03/21   ?  ? PT LONG TERM GOAL #2  ? Title FOTO >/= 55%   ? Time 10   ? Period Weeks   ? Status Unable to assess   ? Target Date 01/07/21   ?  ? PT LONG TERM GOAL #3  ? Title Right shoulder AROM Flexion & abduction 120* and external & internal rotation 60*   ? Baseline R shoulder AROM flexion 73 degrees (improved to 95 degrees after session),  abduction 55 degrees (improved to 72 degrees after session), ER 26 degrees, IR 37 degrees (rotation ROM at 80-90 degrees abduction position) (12/22/2020); 83 degrees flexion, 61 degrees abduction, 42 degrees E

## 2021-04-05 ENCOUNTER — Ambulatory Visit: Payer: Managed Care, Other (non HMO)

## 2021-04-05 ENCOUNTER — Ambulatory Visit: Payer: Managed Care, Other (non HMO) | Admitting: Orthopaedic Surgery

## 2021-04-05 ENCOUNTER — Other Ambulatory Visit: Payer: Self-pay

## 2021-04-05 DIAGNOSIS — R600 Localized edema: Secondary | ICD-10-CM

## 2021-04-05 DIAGNOSIS — M6281 Muscle weakness (generalized): Secondary | ICD-10-CM

## 2021-04-05 DIAGNOSIS — M25611 Stiffness of right shoulder, not elsewhere classified: Secondary | ICD-10-CM

## 2021-04-05 DIAGNOSIS — Z9889 Other specified postprocedural states: Secondary | ICD-10-CM

## 2021-04-05 DIAGNOSIS — M25511 Pain in right shoulder: Secondary | ICD-10-CM

## 2021-04-05 DIAGNOSIS — R293 Abnormal posture: Secondary | ICD-10-CM

## 2021-04-05 NOTE — Therapy (Signed)
? ?  ? ?OUTPATIENT PHYSICAL THERAPY TREATMENT ?And ?Discharge Symmary ? ? ?Patient Name: Tina Stephenson ?MRN: [id number] ?DOB:May 07, 1955, 66 y.o., female ?Today's Date: 04/05/2021 ? ?PCP: Tina Grana, PA-C ?REFERRING PROVIDER: Aundra Dubin, PA-C ? ? ? PT End of Session - 04/05/21 0804   ? ? Visit Number 62   ? Number of Visits 45   ? Date for PT Re-Evaluation 04/07/21   ? Authorization Type [employer] Managed   ? Authorization Time Period 20% CO-INSURANCE, DED MET, 60 DAYS PER CALENDER YEAR AFTER 5TH VISIT, MEDICAL NESSITY NEEDED/REF#[id number]   ? Authorization - Visit Number 17   ? Authorization - Number of Visits 26   ? PT Start Time [phone]   ? PT Stop Time 0845   ? PT Time Calculation (min) 41 min   ? Activity Tolerance Patient tolerated treatment well   ? Behavior During Therapy [facility] for tasks assessed/performed   ? ?  ?  ? ?  ? ? ?  ? ? ?Past Medical History:  ?Diagnosis Date  ? Bronchitis   ? Depression   ? Diabetes mellitus without complication ([place])   ? Gallbladder polyp   ? GERD (gastroesophageal reflux disease)   ? Hypertension   ? Hypertensive disorder 03/05/2015  ? Liver tumor (benign)   ? Sacrum and coccyx fracture ([place])   ? Sciatica   ? ?Past Surgical History:  ?Procedure Laterality Date  ? APPENDECTOMY    ? NASAL SEPTUM SURGERY    ? WISDOM TOOTH EXTRACTION    ? ?Patient Active Problem List  ? Diagnosis Date Noted  ? Upper respiratory infection, acute 12/20/2020  ? Bruised toe 12/20/2020  ? Traumatic tear of supraspinatus tendon of right shoulder 09/24/2020  ? Tendinopathy of right biceps tendon 09/24/2020  ? Traumatic tear of supraspinatus tendon of left shoulder 09/24/2020  ? Leg swelling 08/02/2020  ? Dyslipidemia 01/05/2020  ? Cervical polyp 08/22/2018  ? Nocturia 08/22/2018  ? Intertrigo 08/22/2018  ? Type 2 diabetes mellitus without complication, without long-term current use of insulin ([place]) 08/22/2018  ? Cervical spine arthritis with nerve pain 06/25/2018  ? Herniated disc, cervical 06/25/2018  ? History of  migraine 03/25/2018  ? Scoliosis 03/25/2018  ? History of cyst of breast 03/25/2018  ? Sacrum and coccyx fracture, sequela 02/21/2018  ? Class 3 severe obesity due to excess calories without serious comorbidity with body mass index (BMI) of 45.0 to 49.9 in adult [facility]) 02/21/2018  ? Cough 02/21/2018  ? Onychomycosis 02/21/2018  ? History of hypertension 02/21/2018  ? Vitamin D deficiency 03/19/2015  ? Essential hypertension 03/05/2015  ? ? ?REFERRING DIAG: Z98.890 (ICD-10-CM) - S/P right rotator cuff repair ? ?THERAPY DIAG:  ?Muscle weakness (generalized) ? ?Stiffness of right shoulder, not elsewhere classified ? ?Acute pain of right shoulder ? ?Abnormal posture ? ?Localized edema ? ?PERTINENT HISTORY: Tina Stephenson is a 10yoF referred by Tina Melena, PA s/p right rotator cuff repair on 10/14/2020 c Dr. Erlinda Stephenson at [facility] s/p full-thickness tear of Supraspinatus. Pt also has Left supraspinatus full thickness tear, operative management awaiting full recovery from Rt surgery. She was immobilized post procedure, then on 11/17 allowed to DC the shoulder sling. ? ?PRECAUTIONS: No known precautions ? ?SUBJECTIVE: R shoulder is doing really really good. Just a tad bit of soreness. Ready to graduate form PT today. Does not need the shot.  ? ? ?PAIN:  ?Are you having pain? Closer to 0/10, Just a tad bit of soreness.  ? ? ? ? ? ?  TODAY'S TREATMENT:  ? ?Manual therapy  ?  ?Seated STM R anterior and posterior shoulder  to decrease fascial restrictions  ?     ? ? ?    ?Therapeutic exercises ? ?Starting functional R shoulder IR R thumb to T8 spinous process.  ? ?Standing R shoulder IR isometrics, hand on abdomen (to target subscapularis muscle) 10x3 with 5 second holds  ?  ? ?Standing R shoulder functional IR with gentle long axis distraction 10x3 ?            improved ease of R shoulder functional IR afterwards  ? ? ?Seated self R shoulder inferior glide 10x10 seconds for 2 sets ? ?Standing functional R shoulder IR with A to P  pressure from PT 10x3 ? ? ?Standing R shoulder AROM  ? Flexion: 144 degrees  ? Abduction: 134 degrees ? Functional ER: R Middle finger to T1 spinous process ? Functional IR: R thumb to the middle of T7 and T8 spinous process ? No pain with AROM  ? ?Manually resisted R shoulder flexion, abduction, ER, IR  ? ?Reviewed progress with PT towards goals. ? ? ? ? ? ? ?Improved exercise technique, movement at target joints, use of target muscles after mod verbal, visual, tactile cues.  ?  ?   ? ? ?PATIENT EDUCATION: ?Education details: there-ex ?Person educated: Patient ?Education method: Explanation, Demonstration, Tactile cues, and Verbal cues ?Education comprehension: verbalized understanding and returned demonstration ? ? ?HOME EXERCISE PROGRAM: ?Access Code: [email or web address] ?URL: [email or web address] ?Date: 03/28/2021 ?Prepared by: Tina Stephenson ? ?Exercises ?Chest and Bicep Stretch - Arms Behind Back - 1 x daily - 7 x weekly - 1 sets - 3 reps - 20 hold ?Doorway Pec Stretch at 90 Degrees Abduction - 1 x daily - 7 x weekly - 1 sets - 3 reps - 30 hold ?Isometric Tricep Extension - 1 x daily - 7 x weekly - 3 sets - 10 reps - 5 seconds hold ?Single Arm Shoulder Extension with Anchored Resistance - 1 x daily - 7 x weekly - 1-3 sets - 10 reps - 5 seconds hold ?Seated Shoulder Inferior Glide - 1 x daily - 7 x weekly - 3 sets - 10 reps - 5 seconds hold ?Shoulder External Rotation and Scapular Retraction with Resistance - 1 x daily - 7 x weekly - 2 sets - 10 reps ? ? ? ?Standing R shoulder IR isometrics, hand on abdomen (to target subscapularis muscle) 10x3 with 5 second holds  ? Improved R shoulder fucntional IR to thumb to T12 spinous process.  ? Reviewed and given as part of her HEP. Pt verbalized understanding.  ? ? ? ? ? ? ?  ?Response to treatment ?Pt tolerated session well without aggravation of symptoms.  ?  ?  ?Clinical impression. ?Pt demonstrates decreased R shoulder pain, improved R shoulder AROM, strength and  function aince initial evaluation. Pt has made very good progress with PT towards goals and demonstrates independence and consistency with her HEP. Skilled physical therapy services discharged with pt continuing progress with her exercises at home.  ? ? ? ? PT Short Term Goals - 01/06/21 1219   ? ?  ? PT SHORT TERM GOAL #1  ? Title FOTO improved to 35%   ? Baseline Unable to access FOTO information and retrieve questionnaire for pt to fill out. (11/22/2020)   ? Time 4   ? Period Weeks   ? Status On-going   ? Target Date 11/23/20   ?  ?  PT SHORT TERM GOAL #2  ? Title right shoulder PROM flexion & abduction 100*   ? Baseline Supine AAROM R shoulder 115 degrees flexion, 91 degrees abduction (11/22/2020); supine AAROM R shoulder flexion 120 degrees, abduction 97 degrees (12/22/2020); 131 degrees flexion AAROM, 100 degrees abduction (01/06/2021)   ? Time 4   ? Period Weeks   ? Status Achieved   ? Target Date 11/23/20   ?  ? PT SHORT TERM GOAL #3  ? Title Patient is independent with initial HEP; No questions after reviewed and redemonstrated today (11/22/2020)   ? Time 4   ? Period Weeks   ? Status Achieved   ? Target Date 11/23/20   ? ?  ?  ? ?  ? ? ? ? PT Long Term Goals - 04/05/21 0809   ? ?  ? PT LONG TERM GOAL #1  ? Title Patient reports pain in right shoulder </= 2/10 with activities   ? Baseline 3/10 at most for the past 7 days (12/22/2020); 1-2/10 at worst, occasional 3/10 (01/06/2021); 02/01/21- 3/10 NPS; 1/10 at most for the past 7 days. (03/03/2021); 0/10 at worst for the past 7 days (04/05/2021)   ? Time 8   ? Period Weeks   ? Status Achieved   ? Target Date 03/03/21   ?  ? PT LONG TERM GOAL #2  ? Title FOTO >/= 55%   ? Baseline Unable to locate FOTO   ? Time 10   ? Period Weeks   ? Status Unable to assess   ? Target Date 01/07/21   ?  ? PT LONG TERM GOAL #3  ? Title Right shoulder AROM Flexion & abduction 120* and external & internal rotation 60*   ? Baseline R shoulder AROM flexion 73 degrees (improved to 95  degrees after session), abduction 55 degrees (improved to 72 degrees after session), ER 26 degrees, IR 37 degrees (rotation ROM at 80-90 degrees abduction position) (12/22/2020); 83 degrees flexion, 61 degr

## 2021-04-05 NOTE — Progress Notes (Signed)
? ?Office Visit Note ?  ?Patient: Tina Stephenson           ?Date of Birth: [birth year]           ?MRN: [id number] ?Visit Date: 04/05/2021 ?             ?Requested by: Delsa Grana, PA-C ?[address] ?[place],  [place] [address] ?PCP: Delsa Grana, PA-C ? ? ?Assessment & Plan: ?Visit Diagnoses:  ?1. S/P right rotator cuff repair   ? ? ?Plan: Shynia is status post right rotator cuff repair on 10/14/2020.  This is her 31-monthvisit.  She has done extremely well and she finished PT today.  She is very happy.  She feels that she has excellent range of motion and strength. ? ?Examination of right shoulder shows excellent range of motion and strength and functionality. ? ?WLatoiyahas recovered from the surgery very well and she is very functional with that arm now.  She knows that she should always exhibit caution when using the arm.  She will use as tolerated.  We did briefly talk about the left shoulder which shows a partial with full-thickness supraspinatus tear.  She is looking at surgery later this year.  She will be very careful with use of the left shoulder so that she does not tear of the rotator cuff worse. ? ?Follow-Up Instructions: Return if symptoms worsen or fail to improve.  ? ?Orders:  ?No orders of the defined types were placed in this encounter. ? ?No orders of the defined types were placed in this encounter. ? ? ? ? Procedures: ?No procedures performed ? ? ?Clinical Data: ?No additional findings. ? ? ?Subjective: ?Chief Complaint  ?Patient presents with  ? Right Shoulder - Follow-up  ? ? ?HPI ? ?Review of Systems ? ? ?Objective: ?Vital Signs: There were no vitals taken for this visit. ? ?Physical Exam ? ?Ortho Exam ? ?Specialty Comments:  ?No specialty comments available. ? ?Imaging: ?No results found. ? ? ?PMFS History: ?Patient Active Problem List  ? Diagnosis Date Noted  ? Upper respiratory infection, acute 12/20/2020  ? Bruised toe 12/20/2020  ? Traumatic tear of supraspinatus tendon of right  shoulder 09/24/2020  ? Tendinopathy of right biceps tendon 09/24/2020  ? Traumatic tear of supraspinatus tendon of left shoulder 09/24/2020  ? Leg swelling 08/02/2020  ? Dyslipidemia 01/05/2020  ? Cervical polyp 08/22/2018  ? Nocturia 08/22/2018  ? Intertrigo 08/22/2018  ? Type 2 diabetes mellitus without complication, without long-term current use of insulin ([place] 08/22/2018  ? Cervical spine arthritis with nerve pain 06/25/2018  ? Herniated disc, cervical 06/25/2018  ? History of migraine 03/25/2018  ? Scoliosis 03/25/2018  ? History of cyst of breast 03/25/2018  ? Sacrum and coccyx fracture, sequela 02/21/2018  ? Class 3 severe obesity due to excess calories without serious comorbidity with body mass index (BMI) of 45.0 to 49.9 in adult ([facility] 02/21/2018  ? Cough 02/21/2018  ? Onychomycosis 02/21/2018  ? History of hypertension 02/21/2018  ? Vitamin D deficiency 03/19/2015  ? Essential hypertension 03/05/2015  ? ?Past Medical History:  ?Diagnosis Date  ? Bronchitis   ? Depression   ? [place]   ? Gallbladder polyp   ? GERD (gastroesophageal reflux disease)   ? Hypertension   ? Hypertensive disorder 03/05/2015  ? Liver tumor (benign)   ? Sac[place] coccyx fracture ([place]   ? Sciatica   ?  ?Family History  ?Problem Relation Age of Onset  ?  COPD Mother   ? Cancer Mother   ? Heart disease Father   ? Breast cancer Paternal Aunt   ? Diabetes Brother   ? Diabetes Maternal Grandfather   ? Ovarian cancer Neg Hx   ? Colon cancer Neg Hx   ?  ?Past Surgical History:  ?Procedure Laterality Date  ? APPENDECTOMY    ? NASAL SEPTUM SURGERY    ? WISDOM TOOTH EXTRACTION    ? ?Social History  ? ?Occupational History  ? Not on file  ?Tobacco Use  ? Smoking status: Never  ? Smokeless tobacco: Never  ?Vaping Use  ? Vaping Use: Never used  ?Substance and Sexual Activity  ? Alcohol use: Never  ? Drug use: Never  ? Sexual activity: Not Currently  ?  Birth control/protection: None  ? ? ? ? ? ? ?

## 2021-04-13 ENCOUNTER — Encounter: Payer: Self-pay | Admitting: Orthopaedic Surgery

## 2021-05-01 ENCOUNTER — Other Ambulatory Visit: Payer: Self-pay | Admitting: Family Medicine

## 2021-05-01 DIAGNOSIS — I10 Essential (primary) hypertension: Secondary | ICD-10-CM

## 2021-05-06 ENCOUNTER — Encounter: Payer: Self-pay | Admitting: Physician Assistant

## 2021-05-06 ENCOUNTER — Ambulatory Visit: Payer: Managed Care, Other (non HMO) | Admitting: Physician Assistant

## 2021-05-06 VITALS — BP 128/70 | HR 98 | Resp 16 | Ht 63.0 in | Wt 230.0 lb

## 2021-05-06 DIAGNOSIS — M654 Radial styloid tenosynovitis [de Quervain]: Secondary | ICD-10-CM

## 2021-05-06 DIAGNOSIS — E785 Hyperlipidemia, unspecified: Secondary | ICD-10-CM

## 2021-05-06 DIAGNOSIS — E119 Type 2 diabetes mellitus without complications: Secondary | ICD-10-CM

## 2021-05-06 DIAGNOSIS — I10 Essential (primary) hypertension: Secondary | ICD-10-CM | POA: Diagnosis not present

## 2021-05-06 DIAGNOSIS — Z6841 Body Mass Index (BMI) 40.0 and over, adult: Secondary | ICD-10-CM

## 2021-05-06 DIAGNOSIS — L819 Disorder of pigmentation, unspecified: Secondary | ICD-10-CM

## 2021-05-06 NOTE — Patient Instructions (Addendum)
For your thumb/ hand pain I recommend the following: ?Heat or ice pack - per preference ?Using a thumb spica splint to reduce motion and provide stability ?You can use Tylenol and Ibuprofen to assist with discomfort ?Let us know if it is not getting better with these measures ? ? ?For your leg swelling I recommend the following ?Using compression stockings with 15-20 mmHg compression force ?Try to put these on first thing in the morning to help  ?Elevating your legs when you can- to at least the level of the heart ?Staying active and walking as you are able ? ?

## 2021-05-06 NOTE — Assessment & Plan Note (Signed)
Chronic, ongoing, appears stable ?Currently managed with HCTZ 12.5 mg PO QD ?Appears to be tolerating well  ?Denies signs or symptoms of hypotension ?Encouraged increased physical activity  ?Recommend she continue with current diet and medications ?Follow up in 6 months ?

## 2021-05-06 NOTE — Assessment & Plan Note (Signed)
Chronic, historic condition ?Currently taking Rosuvastatin 5 mg PO QD  ?Continue current medications ?Recommend increased efforts with diet and exercise ?Recheck lipid panel today- results to dictate further management  ?Follow up in 6 months ? ?

## 2021-05-06 NOTE — Assessment & Plan Note (Signed)
Chronic, ongoing ?Patient is currently following reduced sugar and no carb diet  ?States she is trying to exercise regularly but is mostly just walking intermittently  ?She is on statin therapy and DM appears to be well controlled ?Recommend she continue with diet and exercise efforts ?Follow up in 6 months ? ?

## 2021-05-06 NOTE — Progress Notes (Signed)
? ? ? ?     Established Patient Office Visit ? ?Name: Tina Stephenson   MRN: [id number]    DOB: November 08, 1955   Date:05/06/2021 ? ?Today's Provider: Talitha Givens, MHS, PA-C ?Introduced myself to the patient as a Journalist, newspaper and provided education on APPs in clinical practice.  ? ?      ?Subjective ? ?Chief Complaint ? ?Chief Complaint  ?Patient presents with  ? Follow-up  ? Hand Pain  ?  L, x1 week. "Shooting", Getting worse.  ? Knot on Head  ?  Itching, no pain  ? ? ?HPI ? ? ?Hypertension: ?- Medications: HCTZ 12. 5 mg PO QD  ?- Compliance: excellent  ?- Checking BP at home: She has tried but her BP cuffs do not work ?- Denies any SOB, CP, vision changes, LE edema, medication SEs, or symptoms of hypotension ?- Diet: Trying to follow R3 program - appears to be diabetes conscious in nature, reduced sugar and carbs, 3 regular meals and 3 snacks per day ?- Exercise: She is trying to increase, mostly walking at this time ? ? ?Diabetes, Type 2 ?- Last A1c 6.4 ?- Medications: Metformin 500  mg 2 tablets PO BID ?- Compliance: Excellent ?- Checking BG at home: Yes, normally runs around 90 in AM, 128 after dinner ?- Diet: Trying to follow R3 program - appears to be diabetes conscious in nature, reduced sugar and carbs, 3 regular meals and 3 snacks per day ?- Exercise: She is trying to increase, mostly walking at this time ?- Eye exam: Jan 2023 - At [facility] off [address]  ?- Foot exam: Due 09/24/2021 ?- Microalbumin: 18.0 on 09/24/2020 ?- Statin: Rosuvastatin 5 mg PO QD ?- Denies symptoms of hypoglycemia, polyuria, polydipsia, numbness extremities, foot ulcers/trauma ? ? ? ?Acute concerns ? ?Hand pain: Left hand pain along thumb towards wrist  ? ?Knot on head: reports she noticed it about 2 months ago ?States it is itchy, unsure if it has changed in size or character  ?Denies bleeding or discharge ? ? ? ? ?Patient Active Problem List  ? Diagnosis Date Noted  ? Upper respiratory infection, acute 12/20/2020  ? Bruised toe 12/20/2020  ? Traumatic  tear of supraspinatus tendon of right shoulder 09/24/2020  ? Tendinopathy of right biceps tendon 09/24/2020  ? Traumatic tear of supraspinatus tendon of left shoulder 09/24/2020  ? Leg swelling 08/02/2020  ? Dyslipidemia 01/05/2020  ? Cervical polyp 08/22/2018  ? Nocturia 08/22/2018  ? Intertrigo 08/22/2018  ? Type 2 diabetes mellitus without complication, without long-term current use of insulin ([place]) 08/22/2018  ? Cervical spine arthritis with nerve pain 06/25/2018  ? Herniated disc, cervical 06/25/2018  ? History of migraine 03/25/2018  ? Scoliosis 03/25/2018  ? History of cyst of breast 03/25/2018  ? Sacrum and coccyx fracture, sequela 02/21/2018  ? Class 3 severe obesity due to excess calories without serious comorbidity with body mass index (BMI) of 45.0 to 49.9 in adult [facility]) 02/21/2018  ? Cough 02/21/2018  ? Onychomycosis 02/21/2018  ? History of hypertension 02/21/2018  ? Vitamin D deficiency 03/19/2015  ? Essential hypertension 03/05/2015  ? ? ?Past Surgical History:  ?Procedure Laterality Date  ? APPENDECTOMY    ? NASAL SEPTUM SURGERY    ? WISDOM TOOTH EXTRACTION    ? ? ?Family History  ?Problem Relation Age of Onset  ? COPD Mother   ? Cancer Mother   ? Heart disease Father   ? Breast cancer Paternal Aunt   ? Diabetes  Brother   ? Diabetes Maternal Grandfather   ? Ovarian cancer Neg Hx   ? Colon cancer Neg Hx   ? ? ?Social History  ? ?Tobacco Use  ? Smoking status: Never  ? Smokeless tobacco: Never  ?Substance Use Topics  ? Alcohol use: Never  ? ? ? ?Current Outpatient Medications:  ?  acetaminophen (TYLENOL) 500 MG tablet, Take 1,000 mg by mouth 2 (two) times daily., Disp: , Rfl:  ?  acetaminophen-codeine (TYLENOL #3) 300-30 MG tablet, Take 1 tablet by mouth every 8 (eight) hours as needed for moderate pain., Disp: 30 tablet, Rfl: 2 ?  albuterol (PROVENTIL) (2.5 MG/3ML) 0.083% nebulizer solution, Take 3 mLs (2.5 mg total) by nebulization every 6 (six) hours as needed for wheezing or shortness of breath.,  Disp: 150 mL, Rfl: 3 ?  blood glucose meter kit and supplies KIT, Dispense based on patient and insurance preference. Use once daily as directed. (FOR ICD-10 E11.9)., Disp: 1 each, Rfl: 0 ?  glucose blood test strip, Use as instructed, Disp: 100 each, Rfl: 1 ?  hydrochlorothiazide (HYDRODIURIL) 12.5 MG tablet, TAKE 1 TABLET(12.5 MG) BY MOUTH DAILY, Disp: 90 tablet, Rfl: 0 ?  Lancets (ONETOUCH DELICA PLUS [id number]) MISC, USE TO CHECK BLOOD SUGAR TWICE DAILY, Disp: 100 each, Rfl: 5 ?  metFORMIN (GLUCOPHAGE) 500 MG tablet, TAKE 2 TABLETS(1000 MG) BY MOUTH TWICE DAILY WITH A MEAL, Disp: 180 tablet, Rfl: 0 ?  Multiple Vitamins-Minerals (MULTIVITAMIN WITH MINERALS) tablet, Take 1 tablet by mouth daily., Disp: , Rfl:  ?  rosuvastatin (CRESTOR) 5 MG tablet, Take 1 tablet (5 mg total) by mouth daily., Disp: 90 tablet, Rfl: 3 ?  SUMAtriptan (IMITREX) 25 MG tablet, Take 25-50 mg po at migraine onset, and may repeat dose in 2 hours as needed for unresolved HA, Maximum daily dose 200 mg in 24 hours, Disp: 20 tablet, Rfl: 3 ? ?Allergies  ?Allergen Reactions  ? Penicillins Other (See Comments)  ?  "lose my hearing"  ? ? ?I personally reviewed active problem list, medication list, allergies, social history, health maintenance with the patient/caregiver today. ? ? ?Review of Systems  ?Constitutional:  Negative for chills, fever and malaise/fatigue.  ?HENT:  Negative for congestion and sore throat.   ?Respiratory:  Negative for cough, shortness of breath and wheezing.   ?Cardiovascular:  Negative for chest pain, palpitations and leg swelling.  ?Gastrointestinal:  Positive for heartburn. Negative for diarrhea, nausea and vomiting.  ?Musculoskeletal:  Negative for falls and myalgias.  ?Skin:  Positive for itching. Negative for rash.  ?Neurological:  Negative for dizziness, tingling, weakness and headaches.  ?Psychiatric/Behavioral:  Negative for depression. The patient is not nervous/anxious and does not have insomnia.    ? ? ? ?Objective ? ?Vitals:  ? 05/06/21 1300  ?BP: 128/70  ?Pulse: 98  ?Resp: 16  ?SpO2: 99%  ?Weight: 230 lb (104.3 kg)  ?Height: _0  (1.6 m)  ? ? ?Body mass index is 40.74 kg/m?. ? ?Physical Exam ?Vitals reviewed.  ?Constitutional:   ?   Appearance: Normal appearance. She is obese.  ?HENT:  ?   Head: Normocephalic and atraumatic.  ?   Mouth/Throat:  ?   Mouth: Mucous membranes are moist.  ?   Pharynx: Oropharynx is clear. No oropharyngeal exudate or posterior oropharyngeal erythema.  ?Eyes:  ?   Conjunctiva/sclera: Conjunctivae normal.  ?   Pupils: Pupils are equal, round, and reactive to light.  ?Cardiovascular:  ?   Rate and Rhythm: Normal rate and regular  rhythm.  ?   Pulses: Normal pulses.     ?     Radial pulses are 2+ on the right side and 2+ on the left side.  ?   Heart sounds: Normal heart sounds.  ?   Comments: Nonpitting edema ?Pulmonary:  ?   Effort: Pulmonary effort is normal. No respiratory distress.  ?   Breath sounds: Normal breath sounds. No decreased air movement. No wheezing, rhonchi or rales.  ?Musculoskeletal:  ?   Right wrist: No swelling, deformity, effusion, tenderness or snuff box tenderness. Normal range of motion. Normal pulse.  ?   Left wrist: Tenderness and snuff box tenderness present. No swelling, deformity or effusion. Normal pulse.  ?   Right hand: Normal capillary refill. Normal pulse.  ?   Left hand: Decreased range of motion. Normal capillary refill. Normal pulse.  ?   Cervical back: Normal range of motion and neck supple. No tenderness.  ?   Right lower leg: Edema present.  ?   Left lower leg: Edema present.  ?   Comments: Positive Finkelsteins test in left hand  ?Lymphadenopathy:  ?   Head:  ?   Right side of head: No submental or submandibular adenopathy.  ?   Left side of head: No submental or submandibular adenopathy.  ?   Upper Body:  ?   Right upper body: No supraclavicular adenopathy.  ?   Left upper body: No supraclavicular adenopathy.  ?Neurological:  ?   Mental  Status: She is alert.  ?Psychiatric:     ?   Attention and Perception: Attention normal.     ?   Mood and Affect: Mood and affect normal.     ?   Speech: Speech normal.     ?   Behavior: Behavior normal. Behavior is c

## 2021-05-06 NOTE — Assessment & Plan Note (Signed)
Chronic, historic condition  ?Currently managed with metformin 500 mg - 2 tablets PO BID  ?Appears to be tolerating well  ?Most recent A1c was 6.4  ?Recommend increasing physical activity and continuation with diet plan ?Continue current medications ?Will recheck A1c, urine microalbumin,  ?Results to dictate further management ?Follow up in 6 months pending results ? ?

## 2021-05-07 LAB — COMPREHENSIVE METABOLIC PANEL
ALT: 22 IU/L (ref 0–32)
AST: 21 IU/L (ref 0–40)
Albumin/Globulin Ratio: 2.1 (ref 1.2–2.2)
Albumin: 4.7 g/dL (ref 3.8–4.8)
Alkaline Phosphatase: 85 IU/L (ref 44–121)
BUN/Creatinine Ratio: 23 (ref 12–28)
BUN: 15 mg/dL (ref 8–27)
Bilirubin Total: 1.2 mg/dL (ref 0.0–1.2)
CO2: 26 mmol/L (ref 20–29)
Calcium: 9.4 mg/dL (ref 8.7–10.3)
Chloride: 103 mmol/L (ref 96–106)
Creatinine, Ser: 0.64 mg/dL (ref 0.57–1.00)
Globulin, Total: 2.2 g/dL (ref 1.5–4.5)
Glucose: 92 mg/dL (ref 70–99)
Potassium: 4.2 mmol/L (ref 3.5–5.2)
Sodium: 142 mmol/L (ref 134–144)
Total Protein: 6.9 g/dL (ref 6.0–8.5)
eGFR: 99 mL/min/{1.73_m2} (ref >59)

## 2021-05-07 LAB — CBC WITH DIFFERENTIAL/PLATELET
Basophils Absolute: 0.1 10*3/uL (ref 0.0–0.2)
Basos: 1 %
EOS (ABSOLUTE): 0.2 10*3/uL (ref 0.0–0.4)
Eos: 2 %
Hematocrit: 38.2 % (ref 34.0–46.6)
Hemoglobin: 12.5 g/dL (ref 11.1–15.9)
Immature Grans (Abs): 0 10*3/uL (ref 0.0–0.1)
Immature Granulocytes: 0 %
Lymphocytes Absolute: 3.8 10*3/uL — ABNORMAL HIGH (ref 0.7–3.1)
Lymphs: 46 %
MCH: 29 pg (ref 26.6–33.0)
MCHC: 32.7 g/dL (ref 31.5–35.7)
MCV: 89 fL (ref 79–97)
Monocytes Absolute: 0.5 10*3/uL (ref 0.1–0.9)
Monocytes: 5 %
Neutrophils Absolute: 3.8 10*3/uL (ref 1.4–7.0)
Neutrophils: 46 %
Platelets: 364 10*3/uL (ref 150–450)
RBC: 4.31 x10E6/uL (ref 3.77–5.28)
RDW: 13.1 % (ref 11.7–15.4)
WBC: 8.3 10*3/uL (ref 3.4–10.8)

## 2021-05-07 LAB — HEMOGLOBIN A1C
Est. average glucose Bld gHb Est-mCnc: 117 mg/dL
Hgb A1c MFr Bld: 5.7 % — ABNORMAL HIGH (ref 4.8–5.6)

## 2021-05-07 LAB — LIPID PANEL
Chol/HDL Ratio: 2.3 ratio (ref 0.0–4.4)
Cholesterol, Total: 130 mg/dL (ref 100–199)
HDL: 57 mg/dL (ref >39)
LDL Chol Calc (NIH): 52 mg/dL (ref 0–99)
Triglycerides: 120 mg/dL (ref 0–149)
VLDL Cholesterol Cal: 21 mg/dL (ref 5–40)

## 2021-05-07 LAB — MICROALBUMIN, URINE: Microalbumin, Urine: 3.7 ug/mL

## 2021-05-11 ENCOUNTER — Other Ambulatory Visit: Payer: Self-pay | Admitting: Family Medicine

## 2021-05-11 DIAGNOSIS — I10 Essential (primary) hypertension: Secondary | ICD-10-CM

## 2021-05-11 NOTE — Telephone Encounter (Signed)
Pt called to report that she never did receive the original prescription, she has two days left.  ?

## 2021-05-11 NOTE — Telephone Encounter (Signed)
No answer calling patient regarding medication, LVMTCB. ?
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
## 2021-05-11 NOTE — Telephone Encounter (Signed)
Called patient to verify if she has picked up hydrodiuril Rx 12.5 mg that was signed on 05/02/21. Duplicate request for refill placed by pharmacy.  ?

## 2021-05-11 NOTE — Telephone Encounter (Signed)
Called [facility] and medication request needs to be resent per pharmacy  ?

## 2021-05-14 ENCOUNTER — Encounter: Payer: Self-pay | Admitting: Family Medicine

## 2021-05-24 ENCOUNTER — Encounter: Payer: Self-pay | Admitting: Family Medicine

## 2021-05-24 ENCOUNTER — Ambulatory Visit: Payer: Managed Care, Other (non HMO) | Admitting: Family Medicine

## 2021-05-24 VITALS — BP 124/86 | HR 94 | Resp 16 | Ht 63.0 in | Wt 227.0 lb

## 2021-05-24 DIAGNOSIS — Z0000 Encounter for general adult medical examination without abnormal findings: Secondary | ICD-10-CM | POA: Diagnosis not present

## 2021-05-24 DIAGNOSIS — Z1231 Encounter for screening mammogram for malignant neoplasm of breast: Secondary | ICD-10-CM

## 2021-05-24 DIAGNOSIS — Z780 Asymptomatic menopausal state: Secondary | ICD-10-CM | POA: Diagnosis not present

## 2021-05-24 NOTE — Patient Instructions (Addendum)
Preventing Osteoporosis, Adult ?Osteoporosis is a condition that causes the bones to lose density. This means that the bones become thinner, and the normal spaces in bone tissue become larger. Low bone density can make the bones weak and cause them to break more easily. ?Osteoporosis cannot always be prevented, but you can take steps to lower your risk of developing this condition. ?How can this condition affect me? ?If you develop osteoporosis, you will be more likely to break bones in your wrist, spine, or hip. Even a minor accident or injury can be enough to break weak bones. The bones will also be slower to heal. Osteoporosis can cause other problems as well, such as a stooped posture or trouble with movement. ?Osteoporosis can occur with aging. As you get older, you may lose bone tissue more quickly, or it may be replaced more slowly. Osteoporosis is more likely to develop if you have poor nutrition or do not get enough calcium or vitamin D. Other lifestyle factors can also play a role. By eating a well-balanced diet and making lifestyle changes, you can help keep your bones strong and healthy, lowering your chances of developing osteoporosis. ?What can increase my risk? ?The following factors may make you more likely to develop osteoporosis: ?Having a family history of the condition. ?Having poor nutrition or not getting enough calcium or vitamin D. ?Using certain medicines, such as steroid medicines or anti-seizure medicines. ?Being any of the following: ?28 years of age or older. ?Female. ?A woman who has gone through menopause (is postmenopausal). ?A person who is of European or Asian descent. ?Using products that contain nicotine or tobacco, such as cigarettes, e-cigarettes, and chewing tobacco. ?Not being physically active (being sedentary). ?Having a small body frame. ?What actions can I take to prevent this? ?Get enough calcium ? ?Make sure you get enough calcium every day. Calcium is the most important  mineral for bone health. Most people can get enough calcium from their diet, but supplements may be recommended for people who are at risk for osteoporosis. Follow these guidelines: ?If you are age 27 or younger, aim to get 1,000 milligrams (mg) of calcium every day. ?If you are older than age 45, aim to get 1,200 mg of calcium every day. ?Good sources of calcium include: ?Dairy products, such as low-fat or nonfat milk, cheese, and yogurt. ?Dark green leafy vegetables, such as bok choy and broccoli. ?Foods that have had calcium added to them (calcium-fortified foods), such as orange juice, cereal, bread, soy beverages, and tofu products. ?Nuts, such as almonds. ?Check nutrition labels to see how much calcium is in a food or drink. ?Get enough vitamin D ?Try to get enough vitamin D every day. Vitamin D is the most essential vitamin for bone health. It helps the body absorb calcium. Follow these guidelines for how much vitamin D to get from food: ?If you are age 32 or younger, aim to get at least 600 international units (IU) every day. Your health care provider may suggest more. ?If you are older than age 43, aim to get at least 800 international units every day. Your health care provider may suggest more. ?Good sources of vitamin D in your diet include: ?Egg yolks. ?Oily fish, such as salmon, sardines, and tuna. ?Milk and cereal fortified with vitamin D. ?Your body also makes vitamin D when you are out in the sun. Exposing the bare skin on your face, arms, legs, or back to the sun for no more than 30 minutes a  day, 2 times a week is more than enough. Beyond that, make sure you use sunblock to protect your skin from sunburn, which increases your risk for skin cancer. ?Exercise ? ?Stay active and get exercise every day. ?Ask your health care provider what types of exercise are best for you. Weight-bearing and strength-building activities are important for building and maintaining healthy bones. Some examples of these  types of activities include: ?Walking and hiking. ?Jogging and running. ?Dancing. ?Gym exercises and lifting weights. ?Tennis and racquetball. ?Climbing stairs. ?Tai chi. ?Make other lifestyle changes ?Do not use any products that contain nicotine or tobacco, such as cigarettes, e-cigarettes, and chewing tobacco. If you need help quitting, ask your health care provider. ?Lose weight if you are overweight. ?If you drink alcohol: ?Limit how much you use to: ?0-1 drink a day for women who are not pregnant. ?0-2 drinks a day for men. ?Be aware of how much alcohol is in your drink. In the [place] one drink equals one 12 oz bottle of beer (355 mL), one 5 oz glass of wine (148 mL), or one 1? oz glass of hard liquor (44 mL). ?Where to find support ?If you need help making changes to prevent osteoporosis, talk with your health care provider. You can ask for a referral to a dietitian and a physical therapist. ?Where to find more information ?Learn more about osteoporosis from: ?NIH Osteoporosis and Related [place]: www.bones.SouthExposed.es ?U.S. Office on Women's Health: [place]Be[place]Signs.tn ?National Osteoporosis Foundation: EquipmentWeekly.com.ee ?Summary ?Osteoporosis is a condition that causes weak bones that are more likely to break. ?Eat a healthy diet, making sure you get enough calcium and vitamin D, and stay active by getting regular exercise to help prevent osteoporosis. ?Other ways to reduce your risk of osteoporosis include maintaining a healthy weight and avoiding alcohol and products that contain nicotine or tobacco. ?This information is not intended to replace advice given to you by your health care provider. Make sure you discuss any questions you have with your health care provider. ?Document Revised: 06/12/2019 Document Reviewed: 06/12/2019 ?Elsevier Patient Education ? [place]. ? ? ?Preventive Care 33-86 Years Old, Female ?Preventive care refers to lifestyle choices and visits with your  health care provider that can promote health and wellness. Preventive care visits are also called wellness exams. ?What can I expect for my preventive care visit? ?Counseling ?Your health care provider may ask you questions about your: ?Medical history, including: ?Past medical problems. ?Family medical history. ?Pregnancy history. ?Current health, including: ?Menstrual cycle. ?Method of birth control. ?Emotional well-being. ?Home life and relationship well-being. ?Sexual activity and sexual health. ?Lifestyle, including: ?Alcohol, nicotine or tobacco, and drug use. ?Access to firearms. ?Diet, exercise, and sleep habits. ?Work and work Statistician. ?Sunscreen use. ?Safety issues such as seatbelt and bike helmet use. ?Physical exam ?Your health care provider will check your: ?Height and weight. These may be used to calculate your BMI (body mass index). BMI is a measurement that tells if you are at a healthy weight. ?Waist circumference. This measures the distance around your waistline. This measurement also tells if you are at a healthy weight and may help predict your risk of certain diseases, such as type 2 diabetes and high blood pressure. ?Heart rate and blood pressure. ?Body temperature. ?Skin for abnormal spots. ?What immunizations do I need? ? ?Vaccines are usually given at various ages, according to a schedule. Your health care provider will recommend vaccines for you based on your age, medical history, and  lifestyle or other factors, such as travel or where you work. ?What tests do I need? ?Screening ?Your health care provider may recommend screening tests for certain conditions. This may include: ?Lipid and cholesterol levels. ?Diabetes screening. This is done by checking your blood sugar (glucose) after you have not eaten for a while (fasting). ?Pelvic exam and Pap test. ?Hepatitis B test. ?Hepatitis C test. ?HIV (human immunodeficiency virus) test. ?STI (sexually transmitted infection) testing, if you are  at risk. ?Lung cancer screening. ?Colorectal cancer screening. ?Mammogram. Talk with your health care provider about when you should start having regular mammograms. This may depend on whether you h

## 2021-05-24 NOTE — Progress Notes (Signed)
? ? ?Patient: Tina Stephenson, Female    DOB: 22-Sep-1955, 66 y.o.   MRN: [id number] ?Delsa Grana, PA-C ?Visit Date: 05/24/2021 ? ?Today's Provider: Delsa Grana, PA-C  ? ?Chief Complaint  ?Patient presents with  ? Annual Exam  ? ?Subjective:  ? ?Annual physical exam: ? ?Tina Stephenson is a 66 y.o. female who presents today for complete physical exam: ? ?Exercise/Activity:  some walking, but right knee pain limiting  ?Diet/nutrition:  on a special diet and loosing weight ?Sleep: no concerns  ? ?SDOH Screenings  ? ?Alcohol Screen: Low Risk   ? Last Alcohol Screening Score (AUDIT): 0  ?Depression (PHQ2-9): Low Risk   ? PHQ-2 Score: 2  ?Financial Resource Strain: Low Risk   ? Difficulty of Paying Living Expenses: Not very hard  ?Food Insecurity: Food Insecurity Present  ? Worried About Charity fundraiser in the Last Year: Sometimes true  ? Ran Out of Food in the Last Year: Never true  ?Housing: Not on file  ?Physical Activity: Inactive  ? Days of Exercise per Week: 0 days  ? Minutes of Exercise per Session: 0 min  ?Social Connections: Socially Isolated  ? Frequency of Communication with Friends and Family: Once a week  ? Frequency of Social Gatherings with Friends and Family: Never  ? Attends Religious Services: Never  ? Active Member of Clubs or Organizations: Yes  ? Attends Archivist Meetings: More than 4 times per year  ? Marital Status: Never married  ?Stress: Stress Concern Present  ? Feeling of Stress : To some extent  ?Tobacco Use: Low Risk   ? Smoking Tobacco Use: Never  ? Smokeless Tobacco Use: Never  ? Passive Exposure: Not on file  ?Transportation Needs: No Transportation Needs  ? Lack of Transportation (Medical): No  ? Lack of Transportation (Non-Medical): No  ? ? ? ?USPSTF grade A and B recommendations - reviewed and addressed today ? ?Depression:  ?Phq 9 completed today by patient, was reviewed by me with patient in the room ?PHQ score is neg, pt feels mood is good ? ?  05/24/2021  ?  9:24 AM  05/06/2021  ? 12:59 PM 12/20/2020  ? 12:57 PM 11/05/2020  ?  1:35 PM  ?PHQ 2/9 Scores  ?PHQ - 2 Score 1 0 0 0  ?PHQ- 9 Score 2 0 0 0  ? ? ?  05/24/2021  ?  9:24 AM 05/06/2021  ? 12:59 PM 12/20/2020  ? 12:57 PM 11/05/2020  ?  1:35 PM 09/24/2020  ?  3:17 PM  ?Depression screen PHQ 2/9  ?Decreased Interest 0 0 0 0 0  ?Down, Depressed, Hopeless 1 0 0 0 0  ?PHQ - 2 Score 1 0 0 0 0  ?Altered sleeping 1 0 0 0 0  ?Tired, decreased energy 0 0 0 0 0  ?Change in appetite 0 0 0 0 0  ?Feeling bad or failure about yourself  0 0 0 0 0  ?Trouble concentrating 0 0 0 0 0  ?Moving slowly or fidgety/restless 0 0 0 0 0  ?Suicidal thoughts 0 0 0 0 0  ?PHQ-9 Score 2 0 0 0 0  ?Difficult doing work/chores   Not difficult at all Not difficult at all Not difficult at all  ? ? ?Alcohol screening: ?[place] [facility] Visit from [facility]/16/2020 in [facility]  ?AUDIT-C Score 0  ? ?  ? ? ?Immunizations and Health Maintenance: ?Health Maintenance  ?Topic Date Due  ? OPHTHALMOLOGY EXAM  09/16/2020  ? COVID-19 Vaccine (1) 06/09/2021 (Originally 10/19/1955)  ? Zoster Vaccines- Shingrix (1 of 2) 01/09/2022 (Originally 04/20/2007)  ? INFLUENZA VACCINE  08/09/2021  ? FOOT EXAM  09/24/2021  ? HEMOGLOBIN A1C  11/05/2021  ? MAMMOGRAM  01/12/2022  ? URINE MICROALBUMIN  05/07/2022  ? COLONOSCOPY (Pts 45-58yr Insurance coverage will need to be confirmed)  12/07/2025  ? TETANUS/TDAP  08/21/2028  ? Hepatitis C Screening  Completed  ? HIV Screening  Completed  ? HPV VACCINES  Aged Out  ? DM eye exam - seen this year - need to get records - [facility]  ? ?Hep C Screening: done ? ?STD testing and prevention (HIV/chl/gon/syphilis):  see above, no additional testing desired by pt today- HIV  ? ?Intimate partner violence:safe  ? ?Sexual History/Pain during Intercourse: Single ? ?Menstrual History/LMP/Abnormal Bleeding:   none ?No LMP recorded. Patient is postmenopausal. ? ?Incontinence Symptoms: minor - coughing fits with bronchitis sometimes will cause  some stress incontinence  ? ?Breast cancer:  done Jan  ?Last Mammogram: *see HM list above ?BRCA gene screening:   ? ?Cervical cancer screening: aged out due to low risk per GYN  ?Pt  family hx of cancers - breast, ovarian, uterine, colon:    ? ?Osteoporosis:   ?Discussion on osteoporosis per age, including high calcium and vitamin D supplementation, weight bearing exercises ?Pt is supplementing with daily calcium/Vit D. ?Bone scan/dexa - done in 2020 ?Roughly experienced menopause at age  ? ?Skin cancer:  Hx of skin CA -  none  - going to dermatology for bump on scalp and forehead - [facility] ?Discussed atypical lesions  ? ?Colorectal cancer:   ?Colonoscopy is UTD- 10 year f/up in 2027 ?Discussed concerning signs and sx of CRC, pt denies blood in stool, melena, change in bowels - she has occasional diarrhea  ?GERD -  ? ?Lung cancer:   ?Low Dose CT Chest recommended if Age 64-80years, 20 pack-year currently smoking OR have quit w/in 15years. Patient does not qualify.   ? ?Social History  ? ?Tobacco Use  ? Smoking status: Never  ? Smokeless tobacco: Never  ?Vaping Use  ? Vaping Use: Never used  ?Substance Use Topics  ? Alcohol use: Never  ? Drug use: Never  ?  ? ?[facility] ?AUDIT-C Score 0  ? ?  ? ? ?Family History  ?Problem Relation Age of Onset  ? COPD Mother   ? Cancer Mother   ? Heart disease Father   ? Breast cancer Paternal Aunt   ? Diabetes Brother   ? Diabetes Maternal Grandfather   ? Ovarian cancer Neg Hx   ? Colon cancer Neg Hx   ?  ? ?Blood pressure/Hypertension: ?BP Readings from Last 3 Encounters:  ?05/24/21 124/86  ?05/06/21 128/70  ?02/16/21 120/74  ? ? ?Weight/Obesity: ?Wt Readings from Last 3 Encounters:  ?05/24/21 227 lb (103 kg)  ?05/06/21 230 lb (104.3 kg)  ?02/16/21 243 lb 12.8 oz (110.6 kg)  ? ?BMI Readings from Last 3 Encounters:  ?05/24/21 40.21 kg/m?  ?05/06/21 40.74 kg/m?  ?02/16/21 43.19 kg/m?  ?  ? ?Lipids:   ?Lab Results  ?Component Value Date  ? CHOL 130 05/06/2021  ? CHOL 115 05/07/2020  ? CHOL 182 01/05/2020  ? ?Lab Results  ?Component Value Date  ? HDL 57 05/06/2021  ? HDL 44 05/07/2020  ? HDL 49 01/05/2020  ? ?Lab Results  ?  Component Value Date  ? [place] 52 05/06/2021  ? [place] 47 05/07/2020  ? LDLCALC 100 (H) 01/05/2020  ? ?Lab Results  ?Component Value Date  ? TRIG 120 05/06/2021  ? TRIG 142 05/07/2020  ? TRIG 195 (H) 01/05/2020  ? ?Lab Results  ?Component Value Date  ? CHOLHDL 2.3 05/06/2021  ? CHOLHDL 2.6 05/07/2020  ? CHOLHDL 3.7 01/05/2020  ? ?No results found for: LDLDIRECT ?Based on the results of lipid panel his/her cardiovascular risk factor ( using [facility] )  in the next 10 years is: ?The 10-year ASCVD risk score (Arnett DK, et al., 2019) is: 9.2% ?  Values used to calculate the score: ?    Age: 54 years ?    Sex: Female ?    Is Non-Hispanic African American: No ?    Diabetic: Yes ?    Tobacco smoker: No ?    Systolic Blood Pressure: 343 mmHg ?    Is BP treated: Yes ?    HDL Cholesterol: 57 mg/dL ?    Total Cholesterol: 130 mg/dL ? ?Glucose:  ?Glucose  ?Date Value Ref Range Status  ?05/06/2021 92 70 - 99 mg/dL Final  ?01/05/2021 93 70 - 99 mg/dL Final  ?11/08/2020 102 (H) 70 - 99 mg/dL Final  ? ? ?Advanced Care Planning:  ?A voluntary discussion about advance care planning including the explanation and discussion of advance directives.   ?Discussed health care proxy and Living will, and the patient was able to identify a health care proxy as Ulice Dash - Brother. ?Patient does not have a living will at present time.  ? ?Social History ?      ?Social History  ? ?Socioeconomic History  ? Marital status: Single  ?  Spouse name: Not on file  ? Number of children: 0  ? Years of education: Not on file  ? Highest education level: Not on file  ?Occupational History  ? Not on file  ?Tobacco Use  ? Smoking status: Never  ? Smokeless tobacco: Never  ?Vaping Use  ? Vaping Use: Never used  ?Substance and Sexual  Activity  ? Alcohol use: Never  ? Drug use: Never  ? Sexual activity: Not Currently  ?  Birth control/protection: None  ?Other Topics Concern  ? Not on file  ?Social History Narrative  ? Not on file  ? ?Social Dete

## 2021-06-01 ENCOUNTER — Other Ambulatory Visit: Payer: Self-pay | Admitting: Family Medicine

## 2021-06-01 DIAGNOSIS — E785 Hyperlipidemia, unspecified: Secondary | ICD-10-CM

## 2021-06-01 DIAGNOSIS — E119 Type 2 diabetes mellitus without complications: Secondary | ICD-10-CM

## 2021-06-02 MED ORDER — ROSUVASTATIN CALCIUM 5 MG PO TABS: 5.0000 mg | ORAL_TABLET | Freq: Every day | ORAL | 3 refills | Status: DC

## 2021-07-29 ENCOUNTER — Ambulatory Visit: Payer: Managed Care, Other (non HMO) | Admitting: Orthopaedic Surgery

## 2021-07-29 ENCOUNTER — Telehealth: Payer: Self-pay | Admitting: Orthopaedic Surgery

## 2021-07-29 ENCOUNTER — Other Ambulatory Visit: Payer: Self-pay

## 2021-07-29 DIAGNOSIS — M75121 Complete rotator cuff tear or rupture of right shoulder, not specified as traumatic: Secondary | ICD-10-CM | POA: Insufficient documentation

## 2021-07-29 DIAGNOSIS — M75122 Complete rotator cuff tear or rupture of left shoulder, not specified as traumatic: Secondary | ICD-10-CM

## 2021-07-29 HISTORY — DX: Complete rotator cuff tear or rupture of right shoulder, not specified as traumatic: M75.121

## 2021-07-29 NOTE — Progress Notes (Addendum)
Office Visit Note   Patient: Tina Stephenson           Date of Birth: Apr 11, 1955           MRN: [id number] Visit Date: 07/29/2021              Requested by: Delsa Grana, PA-C [address] [place],  [place] [address] PCP: Delsa Grana, PA-C   Assessment & Plan: Visit Diagnoses:  1. Nontraumatic complete tear of left rotator cuff     Plan: Impression is a full-thickness left supraspinatus tear.  MRI reviewed with Tina Stephenson again.  Risk benefits prognosis reviewed.  All questions answered to her satisfaction.  Her diabetes is under good control.  We will plan to do surgery in the near future.  Follow-Up Instructions: No follow-ups on file.   Orders:  No orders of the defined types were placed in this encounter.  No orders of the defined types were placed in this encounter.     Procedures: No procedures performed   Clinical Data: No additional findings.   Subjective: Chief Complaint  Patient presents with   Left Shoulder - Pain    HPI [name] returns today for follow-up of left rotator cuff tear.  She had an MRI in October 2022 of both shoulders and we elected to do the right shoulder first.  Her right shoulder is completely recovered and she is ready to do the left shoulder now.  Continues to have constant pain and decreased strength and function.  Review of Systems   Objective: Vital Signs: There were no vitals taken for this visit.  Physical Exam  Ortho Exam Examination of left shoulder shows full passive range of motion.  Pain with testing of the superior rotator cuff.  Slight weakness to manual muscle testing the superior cuff.  Pain with impingement testing.  Specialty Comments:  No specialty comments available.  Imaging: No results found.   PMFS History: Patient Active Problem List   Diagnosis Date Noted   Nontraumatic complete tear of left rotator cuff 08/15/2021   Nontraumatic complete tear of right rotator cuff 07/29/2021   Upper  respiratory infection, acute 12/20/2020   Bruised toe 12/20/2020   Traumatic tear of supraspinatus tendon of right shoulder 09/24/2020   Tendinopathy of right biceps tendon 09/24/2020   Traumatic tear of supraspinatus tendon of left shoulder 09/24/2020   Leg swelling 08/02/2020   Dyslipidemia 01/05/2020   Cervical polyp 08/22/2018   Nocturia 08/22/2018   Intertrigo 08/22/2018   Type 2 diabetes mellitus without complication, without long-term current use of insulin ([place]) 08/22/2018   Cervical spine arthritis with nerve pain 06/25/2018   Herniated disc, cervical 06/25/2018   History of migraine 03/25/2018   Scoliosis 03/25/2018   History of cyst of breast 03/25/2018   Sacrum and coccyx fracture, sequela 02/21/2018   Class 3 severe obesity due to excess calories without serious comorbidity with body mass index (BMI) of 45.0 to 49.9 in adult ([place]) 02/21/2018   Onychomycosis 02/21/2018   History of hypertension 02/21/2018   Vitamin D deficiency 03/19/2015   Essential hypertension 03/05/2015   Past Medical History:  Diagnosis Date   Bronchitis    Depression    Diabetes mellitus without complication ([place])    Gallbladder polyp    GERD (gastroesophageal reflux disease)    Hypertension    Hypertensive disorder 03/05/2015   Liver tumor (benign)    Sacrum and coccyx fracture ([place])    Sciatica     Family History  Problem Relation Age of Onset   COPD Mother    Cancer Mother    Heart disease Father    Breast cancer Paternal Aunt    Diabetes Brother    Diabetes Maternal Grandfather    Ovarian cancer Neg Hx    Colon cancer Neg Hx     Past Surgical History:  Procedure Laterality Date   APPENDECTOMY     NASAL SEPTUM SURGERY     WISDOM TOOTH EXTRACTION     Social History   Occupational History   Not on file  Tobacco Use   Smoking status: Never   Smokeless tobacco: Never  Vaping Use   Vaping Use: Never used  Substance and Sexual Activity   Alcohol use: Never   Drug use:  Never   Sexual activity: Not Currently    Birth control/protection: None

## 2021-07-29 NOTE — Telephone Encounter (Signed)
Patient has a surgery date for right shoulder scope, rotator cuff repair, subacromial decompression 08-18-21 at [facility].  She would like to do her sessions for physical therapy at the same place as last time.  See details below:   [facility] [address] [place], [place], [place] [address] Phone: [phone]

## 2021-07-29 NOTE — Telephone Encounter (Signed)
Yes thanks 

## 2021-08-01 ENCOUNTER — Telehealth: Payer: Self-pay | Admitting: Orthopaedic Surgery

## 2021-08-01 NOTE — Telephone Encounter (Signed)
[employer] forms received. To [employer] ?

## 2021-08-05 ENCOUNTER — Encounter: Payer: Self-pay | Admitting: Orthopaedic Surgery

## 2021-08-05 ENCOUNTER — Other Ambulatory Visit: Payer: Self-pay | Admitting: Physician Assistant

## 2021-08-05 DIAGNOSIS — Z9889 Other specified postprocedural states: Secondary | ICD-10-CM

## 2021-08-05 MED ORDER — OXYCODONE-ACETAMINOPHEN 5-325 MG PO TABS
1.0000 | ORAL_TABLET | Freq: Four times a day (QID) | ORAL | 0 refills | Status: DC | PRN
Start: 2021-08-05 — End: 2021-08-14

## 2021-08-05 MED ORDER — ONDANSETRON HCL 4 MG PO TABS: 4.0000 mg | ORAL_TABLET | Freq: Three times a day (TID) | ORAL | 0 refills | Status: DC | PRN

## 2021-08-05 NOTE — Telephone Encounter (Signed)
I sent in meds.  I also sent in referral to PT, but we will not want her to start this until one week post-op

## 2021-08-07 ENCOUNTER — Other Ambulatory Visit: Payer: Self-pay | Admitting: Family Medicine

## 2021-08-07 DIAGNOSIS — I10 Essential (primary) hypertension: Secondary | ICD-10-CM

## 2021-08-12 LAB — LIPID PANEL
Cholesterol: 128 (ref 0–200)
HDL: 58 (ref 35–70)
LDL Cholesterol: 52
LDl/HDL Ratio: 2.2
Triglycerides: 99 (ref 40–160)

## 2021-08-12 LAB — BASIC METABOLIC PANEL
Creatinine: 0.7 (ref 0.5–1.1)
Glucose: 84

## 2021-08-12 LAB — COMPREHENSIVE METABOLIC PANEL: eGFR: 98

## 2021-08-12 LAB — HEMOGLOBIN A1C: Hemoglobin A1C: 5.6

## 2021-08-14 ENCOUNTER — Other Ambulatory Visit: Payer: Self-pay | Admitting: Physician Assistant

## 2021-08-14 MED ORDER — OXYCODONE-ACETAMINOPHEN 5-325 MG PO TABS: 1.0000 | ORAL_TABLET | Freq: Four times a day (QID) | ORAL | 0 refills | Status: DC | PRN

## 2021-08-15 DIAGNOSIS — M75122 Complete rotator cuff tear or rupture of left shoulder, not specified as traumatic: Secondary | ICD-10-CM

## 2021-08-15 HISTORY — DX: Complete rotator cuff tear or rupture of left shoulder, not specified as traumatic: M75.122

## 2021-08-18 ENCOUNTER — Encounter: Payer: Self-pay | Admitting: Orthopaedic Surgery

## 2021-08-25 ENCOUNTER — Ambulatory Visit (INDEPENDENT_AMBULATORY_CARE_PROVIDER_SITE_OTHER): Payer: Managed Care, Other (non HMO) | Admitting: Physician Assistant

## 2021-08-25 ENCOUNTER — Encounter: Payer: Self-pay | Admitting: Physician Assistant

## 2021-08-25 DIAGNOSIS — Z9889 Other specified postprocedural states: Secondary | ICD-10-CM

## 2021-08-25 NOTE — Telephone Encounter (Signed)
Patient would like a handicap placard filled out

## 2021-08-25 NOTE — Progress Notes (Signed)
Post-Op Visit Note   Patient: Tina Stephenson           Date of Birth: 25-Oct-1955           MRN: [id number] Visit Date: 08/25/2021 PCP: Delsa Grana, PA-C   Assessment & Plan:  Chief Complaint:  Chief Complaint  Patient presents with   Left Shoulder - Follow-up    Left shoulder arthroscopy 08/18/2021   Visit Diagnoses:  1. S/P left rotator cuff repair   2. S/P arthroscopy of left shoulder     Plan: Patient is a pleasant 66 year old female who comes in today 1 week status post left shoulder arthroscopic debridement, subacromial decompression and rotator cuff repair 08/18/2021.  She has been doing well.  She has been in mild to moderate pain which is relieved with oxycodone.  She has been compliant wearing her sling.  Examination of the left shoulder reveals fully healed surgical portals with nylon sutures in place.  No evidence of infection or cellulitis.  Fingers warm well perfused.  Today, sutures were removed and Steri-Strips applied.  Intraoperative pictures reviewed.  She currently does not need a refill on pain medication.  She already has a referral for outpatient physical therapy which is set to start next week.  She will continue wearing her sling for another 5 weeks.  Follow-up in 5 weeks for recheck.  Call with concerns or questions.  Follow-Up Instructions: Return in about 5 weeks (around 09/29/2021).   Orders:  No orders of the defined types were placed in this encounter.  No orders of the defined types were placed in this encounter.   Imaging: No new imaging  PMFS History: Patient Active Problem List   Diagnosis Date Noted   Nontraumatic complete tear of left rotator cuff 08/15/2021   Nontraumatic complete tear of right rotator cuff 07/29/2021   Upper respiratory infection, acute 12/20/2020   Bruised toe 12/20/2020   Traumatic tear of supraspinatus tendon of right shoulder 09/24/2020   Tendinopathy of right biceps tendon 09/24/2020   Traumatic tear of  supraspinatus tendon of left shoulder 09/24/2020   Leg swelling 08/02/2020   Dyslipidemia 01/05/2020   Cervical polyp 08/22/2018   Nocturia 08/22/2018   Intertrigo 08/22/2018   Type 2 diabetes mellitus without complication, without long-term current use of insulin ([place]) 08/22/2018   Cervical spine arthritis with nerve pain 06/25/2018   Herniated disc, cervical 06/25/2018   History of migraine 03/25/2018   Scoliosis 03/25/2018   History of cyst of breast 03/25/2018   Sacrum and coccyx fracture, sequela 02/21/2018   Class 3 severe obesity due to excess calories without serious comorbidity with body mass index (BMI) of 45.0 to 49.9 in adult ([place]) 02/21/2018   Onychomycosis 02/21/2018   History of hypertension 02/21/2018   Vitamin D deficiency 03/19/2015   Essential hypertension 03/05/2015   Past Medical History:  Diagnosis Date   Bronchitis    Depression    Diabetes mellitus without complication ([facility])    Gallbladder polyp    GERD (gastroesophageal reflux disease)    Hypertension    Hypertensive disorder 03/05/2015   Liver tumor (benign)    Sacrum and coccyx fracture ([place])    Sciatica     Family History  Problem Relation Age of Onset   COPD Mother    Cancer Mother    Heart disease Father    Breast cancer Paternal Aunt    Diabetes Brother    Diabetes Maternal Grandfather    Ovarian cancer Neg Hx  Colon cancer Neg Hx     Past Surgical History:  Procedure Laterality Date   APPENDECTOMY     NASAL SEPTUM SURGERY     WISDOM TOOTH EXTRACTION     Social History   Occupational History   Not on file  Tobacco Use   Smoking status: Never   Smokeless tobacco: Never  Vaping Use   Vaping Use: Never used  Substance and Sexual Activity   Alcohol use: Never   Drug use: Never   Sexual activity: Not Currently    Birth control/protection: None

## 2021-08-30 ENCOUNTER — Other Ambulatory Visit: Payer: Self-pay

## 2021-08-30 ENCOUNTER — Ambulatory Visit: Payer: Managed Care, Other (non HMO) | Attending: Physician Assistant | Admitting: Physical Therapy

## 2021-08-30 ENCOUNTER — Encounter: Payer: Self-pay | Admitting: Physical Therapy

## 2021-08-30 VITALS — BP 90/49 | HR 68 | Temp 98.7°F | Resp 17

## 2021-08-30 DIAGNOSIS — M79622 Pain in left upper arm: Secondary | ICD-10-CM | POA: Insufficient documentation

## 2021-08-30 DIAGNOSIS — Z9889 Other specified postprocedural states: Secondary | ICD-10-CM | POA: Insufficient documentation

## 2021-08-30 NOTE — Therapy (Unsigned)
OUTPATIENT PHYSICAL THERAPY SHOULDER EVALUATION   Patient Name: Tina Stephenson MRN: [id number] DOB:Aug 29, 1955, 66 y.o., female Today's Date: 08/31/2021   PT End of Session - 08/30/21 1500     Visit Number 1    Number of Visits 20    Date for PT Re-Evaluation 11/09/21    Authorization Type [employer] [id number]    Authorization - Visit Number [id number]    Authorization - Number of Visits 60    Progress Note Due on Visit 10    PT Start Time 1500    PT Stop Time 1545    PT Time Calculation (min) 45 min    Activity Tolerance Patient limited by pain    Behavior During Therapy [facility] for tasks assessed/performed             Past Medical History:  Diagnosis Date   Bronchitis    Depression    Diabetes mellitus without complication ([place])    Gallbladder polyp    GERD (gastroesophageal reflux disease)    Hypertension    Hypertensive disorder 03/05/2015   Liver tumor (benign)    Sacrum and coccyx fracture ([place])    Sciatica    Past Surgical History:  Procedure Laterality Date   APPENDECTOMY     NASAL SEPTUM SURGERY     WISDOM TOOTH EXTRACTION     Patient Active Problem List   Diagnosis Date Noted   Nontraumatic complete tear of left rotator cuff 08/15/2021   Nontraumatic complete tear of right rotator cuff 07/29/2021   Upper respiratory infection, acute 12/20/2020   Bruised toe 12/20/2020   Traumatic tear of supraspinatus tendon of right shoulder 09/24/2020   Tendinopathy of right biceps tendon 09/24/2020   Traumatic tear of supraspinatus tendon of left shoulder 09/24/2020   Leg swelling 08/02/2020   Dyslipidemia 01/05/2020   Cervical polyp 08/22/2018   Nocturia 08/22/2018   Intertrigo 08/22/2018   Type 2 diabetes mellitus without complication, without long-term current use of insulin ([place]) 08/22/2018   Cervical spine arthritis with nerve pain 06/25/2018   Herniated disc, cervical 06/25/2018   History of migraine 03/25/2018   Scoliosis 03/25/2018   History of cyst of breast 03/25/2018    Sacrum and coccyx fracture, sequela 02/21/2018   Class 3 severe obesity due to excess calories without serious comorbidity with body mass index (BMI) of 45.0 to 49.9 in adult ([place]) 02/21/2018   Onychomycosis 02/21/2018   History of hypertension 02/21/2018   Vitamin D deficiency 03/19/2015   Essential hypertension 03/05/2015    PCP: Dr. Delsa Grana   REFERRING PROVIDER: Dr. Delsa Grana   REFERRING DIAG: right shoulder scope, rotator cuff repair, subacromial decompression 08-18-21  THERAPY DIAG:  Pain in left upper arm  S/P left rotator cuff repair  Rationale for Evaluation and Treatment Rehabilitation  ONSET DATE: 08/18/21  SUBJECTIVE:  SUBJECTIVE STATEMENT: See pertinent history  PERTINENT HISTORY: S/p left rtc repair and subacrominal decompression on 08/18/21. H/o same surgery on right shoulder several years ago with successful recovery. She has been icing repeatedly throughout the day for pain and swelling management.   PAIN:  Are you having pain? Yes: NPRS scale: 2-3/10 Pain location: Left anterior shoulder  Pain description: Achy to sharp  Aggravating factors: Raising shoulder above her head  Relieving factors: Leaving shoulder down by her side   PRECAUTIONS: Shoulder  WEIGHT BEARING RESTRICTIONS No  FALLS:  Has patient fallen in last 6 months? No  LIVING ENVIRONMENT: Lives with: lives with their family and lives alone Lives in: House/apartment Stairs: Yes: External: 5 steps; on right going up Has following equipment at home: None  OCCUPATION: Disability leave right now; Health billing specialist   PLOF: Independent  PATIENT GOALS Complete movement of her left arm with no pain   OBJECTIVE:              VITALS: BP 125/67 HR 80 SpO2 100    DIAGNOSTIC FINDINGS:  CLINICAL DATA:   Shoulder pain   EXAM: RIGHT SHOULDER - 3 VIEW   COMPARISON:  None.   FINDINGS: There is no evidence of fracture or dislocation. Mild degenerative changes of the acromioclavicular joint. Bone anchors of the proximal humerus. Soft tissues are unremarkable.   IMPRESSION: No acute osseous abnormality.     Electronically Signed   By: Yetta Glassman M.D.   On: 02/15/2021 12:15    PATIENT SURVEYS:  FOTO 25/100 with target of 53   COGNITION:  Overall cognitive status: Within functional limits for tasks assessed     SENSATION: WFL  POSTURE: Normal posture   UPPER EXTREMITY ROM:                                                                   Cervical: Not performed during eval                               AROM                                            Flex    45                                           Ext      45                        Lat Side Bend R/L 45/45                       Rotation       R/L     60/60     Active ROM Right eval Left Eval/ All PROM  Shoulder flexion 180 70*  Shoulder extension    Shoulder abduction 180  80*  Shoulder adduction    Shoulder internal rotation 70 70  Shoulder external rotation 90 30*  Elbow flexion 150 150  Elbow extension 60 60  Wrist flexion 80 80  Wrist extension 70 70  Wrist ulnar deviation 30 30  Wrist radial deviation 20 20  Wrist pronation    Wrist supination    (Blank rows = not tested)         UPPER EXTREMITY MMT:  MMT Right eval Left eval  Shoulder flexion    Shoulder extension    Shoulder abduction    Shoulder adduction    Shoulder internal rotation    Shoulder external rotation    Middle trapezius    Lower trapezius    Elbow flexion    Elbow extension    Wrist flexion  4+  Wrist extension  4+  Wrist ulnar deviation  4+  Wrist radial deviation  4+  Wrist pronation    Wrist supination    Grip strength (lbs)  Fair   (Blank rows = not tested)    JOINT MOBILITY TESTING:  Not  performed   PALPATION:  Not performed    TODAY'S TREATMENT:  Shoulder Flexion and Extension Pendulums 3 x 10  Shoulder Horizontal Pendulums 3 x 10  Shoulder Pulleys AAROM Flex/ Ext  with RUE active only and LUE PROM 3 x 10  Shoulder Pulleys AAROM Abd/ Add  with RUE active only and LUE PROM 3 x 10   PATIENT EDUCATION: Education details: form and technique for appropriate exercise and explanation of plan of care.  Person educated: Patient Education method: Customer service manager Education comprehension: verbalized understanding and returned demonstration   HOME EXERCISE PROGRAM: Access Code: [email or web address] URL: [email or web address] Date: 08/30/2021 Prepared by: Bradly Chris  Exercises - Flexion-Extension Shoulder Pendulum with Table Support  - 1 x daily - 7 x weekly - 3 sets - 10 reps - Horizontal Shoulder Pendulum with Table Support  - 1 x daily - 7 x weekly - 3 sets - 10 reps - Seated Shoulder Flexion AAROM with Pulley Behind  - 1 x daily - 7 x weekly - 3 sets - 10 reps - Seated Shoulder Scaption AAROM with Pulley at Side  - 1 x daily - 7 x weekly - 3 sets - 10 reps  ASSESSMENT:  CLINICAL IMPRESSION: Patient is a 66 y.o. white female who was seen today for physical therapy evaluation and treatment for s/p 2 weeks left rtc repair with subacromial decompression. She exhibits decreased left shoulder ROM and strength relative to right shoulder along with increased pain. She will benefit from skilled PT to restore left shoulder ROM and strength to be symmetrical to right shoulder to be able to reach up to UE activities such as lifting and reaching for objects to perform ADLs such as cleaning, cooking, or bathing. PT to communicate with medical team to determine whether orthopedist permits proceeding with AROM sooner than 4 weeks, otherwise will hold start of PT until week 4.    OBJECTIVE IMPAIRMENTS decreased ROM, decreased strength, impaired UE functional use, and  pain.   ACTIVITY LIMITATIONS lifting, dressing, reach over head, and hygiene/grooming  PARTICIPATION LIMITATIONS: cleaning, laundry, driving, and shopping  PERSONAL FACTORS  Diabetes mellitus  are also affecting patient's functional outcome.   REHAB POTENTIAL: Good  CLINICAL DECISION MAKING: Stable/uncomplicated  EVALUATION COMPLEXITY: Low   GOALS: Goals reviewed with patient? No  SHORT TERM GOALS: Target date: 09/14/2021  (Remove Blue Hyperlink)  Pt will be independent with HEP in order to improve strength and balance in order to decrease fall  risk and improve function at home and work. Baseline: NT  Goal status: INITIAL  2.  Patient will be able to remove left arm from sling for increased mobility and as a sign of healing.  Baseline:  Currently wearing sling Goal status: INITIAL  3.  Patient will achieve full PROM of left shoulder as a sign of successful healing and to progress to week 4-10 of protocol.  Baseline: Shoulder Flex R/L 180/ PROM70, Shoulder Abd R/L 180/ PROM 80, Shoulder ER R/L 90/ PROM 30 Goal status: INITIAL    LONG TERM GOALS: Target date: 11/09/2021  (Remove Blue Hyperlink)  Patient will have improved function and activity level as evidenced by an increase in FOTO score by 10 points or more.  Baseline: 23/100 with target of 100 Goal status: INITIAL  2.  Patient will exhibit symmetrical AROM between right and left shoulder to perform reaching, lifting and overhead tasks to carry out ADLs. Baseline: Shoulder Flex R/L 180/ PROM70, Shoulder Abd R/L 180/ PROM 80, Shoulder ER R/L 90/ PROM 30 Goal status: INITIAL  3.  Patient will exhibit symmetrical shoulder strength between right and left shoulder perform reaching, lifting and overhead tasks to carry out ADLs. Baseline: Unable to test left shoulder  Goal status: INITIAL   PLAN: PT FREQUENCY: 2x/week  PT DURATION: 10 weeks  PLANNED INTERVENTIONS: Therapeutic exercises, Neuromuscular re-education,  Patient/Family education, Self Care, Joint mobilization, Joint manipulation, DME instructions, Dry Needling, Electrical stimulation, Cryotherapy, Moist heat, scar mobilization, Manual therapy, and Re-evaluation  PLAN FOR NEXT SESSION: Begin parascapular exercises and soft tissue work of surrounding musculature of left shoulder. Measure PROM and if full then progress to AROM exercises   Bradly Chris PT, DPT  08/31/2021, 7:08 PM

## 2021-08-31 ENCOUNTER — Ambulatory Visit: Payer: Managed Care, Other (non HMO) | Admitting: Physical Therapy

## 2021-08-31 ENCOUNTER — Telehealth: Payer: Self-pay | Admitting: Physical Therapy

## 2021-08-31 NOTE — Telephone Encounter (Signed)
Called pt to notify her that PT had not heard back from physician about whether AROM activity was permitted for tomorrow, so she should cancel apt and continue HEP for tomorrow. Also instructed pt to discontinue pulleys because of her increased pain response.

## 2021-09-01 ENCOUNTER — Encounter: Payer: Self-pay | Admitting: Orthopaedic Surgery

## 2021-09-01 ENCOUNTER — Ambulatory Visit: Payer: Managed Care, Other (non HMO) | Admitting: Physical Therapy

## 2021-09-05 ENCOUNTER — Telehealth: Payer: Self-pay | Admitting: Physical Therapy

## 2021-09-05 ENCOUNTER — Ambulatory Visit: Payer: Managed Care, Other (non HMO) | Admitting: Physical Therapy

## 2021-09-05 NOTE — Telephone Encounter (Signed)
Called pt to inform her to cancel today's apt, because PROM exercises could not be commenced until 21 days post op and that she is only about 17 days post op and she will be closer to this date next appointment. She is in agreement with this plan and she plans on coming to next appointment.

## 2021-09-07 ENCOUNTER — Ambulatory Visit: Payer: Managed Care, Other (non HMO) | Admitting: Physical Therapy

## 2021-09-07 ENCOUNTER — Encounter: Payer: Self-pay | Admitting: Physical Therapy

## 2021-09-07 DIAGNOSIS — Z9889 Other specified postprocedural states: Secondary | ICD-10-CM

## 2021-09-07 DIAGNOSIS — M79622 Pain in left upper arm: Secondary | ICD-10-CM

## 2021-09-07 NOTE — Therapy (Signed)
OUTPATIENT PHYSICAL THERAPY TREATMENT NOTE   Patient Name: Tina Stephenson MRN: [id number] DOB:1955/12/19, 66 y.o., female Today's Date: 09/07/2021  PCP: Delsa Grana PA-C  REFERRING PROVIDER: Dwana Melena PA-C  END OF SESSION:   PT End of Session - 09/07/21 1234     Visit Number 2    Number of Visits 20    Date for PT Re-Evaluation 11/09/21    Authorization Type [employer] 2023    Authorization - Visit Number 2    Authorization - Number of Visits 60    Progress Note Due on Visit 10    PT Start Time 1230    PT Stop Time 1315    PT Time Calculation (min) 45 min    Activity Tolerance Patient limited by pain    Behavior During Therapy [facility] for tasks assessed/performed             Past Medical History:  Diagnosis Date   Bronchitis    Depression    Diabetes mellitus without complication ([place])    Gallbladder polyp    GERD (gastroesophageal reflux disease)    Hypertension    Hypertensive disorder 03/05/2015   Liver tumor (benign)    Sacrum and coccyx fracture ([place])    Sciatica    Past Surgical History:  Procedure Laterality Date   APPENDECTOMY     NASAL SEPTUM SURGERY     WISDOM TOOTH EXTRACTION     Patient Active Problem List   Diagnosis Date Noted   Nontraumatic complete tear of left rotator cuff 08/15/2021   Nontraumatic complete tear of right rotator cuff 07/29/2021   Upper respiratory infection, acute 12/20/2020   Bruised toe 12/20/2020   Traumatic tear of supraspinatus tendon of right shoulder 09/24/2020   Tendinopathy of right biceps tendon 09/24/2020   Traumatic tear of supraspinatus tendon of left shoulder 09/24/2020   Leg swelling 08/02/2020   Dyslipidemia 01/05/2020   Cervical polyp 08/22/2018   Nocturia 08/22/2018   Intertrigo 08/22/2018   Type 2 diabetes mellitus without complication, without long-term current use of insulin ([place]) 08/22/2018   Cervical spine arthritis with nerve pain 06/25/2018   Herniated disc, cervical 06/25/2018   History of migraine  03/25/2018   Scoliosis 03/25/2018   History of cyst of breast 03/25/2018   Sacrum and coccyx fracture, sequela 02/21/2018   Class 3 severe obesity due to excess calories without serious comorbidity with body mass index (BMI) of 45.0 to 49.9 in adult ([place]) 02/21/2018   Onychomycosis 02/21/2018   History of hypertension 02/21/2018   Vitamin D deficiency 03/19/2015   Essential hypertension 03/05/2015    REFERRING DIAG: Z98.890 (ICD-10-CM) - H/O repair of rotator cuff  THERAPY DIAG:  Pain in left upper arm  S/P left rotator cuff repair  Rationale for Evaluation and Treatment Rehabilitation  PERTINENT HISTORY: S/p left rtc repair and subacrominal decompression on 08/18/21. H/o same surgery on right shoulder several years ago with successful recovery. She has been icing repeatedly throughout the day for pain and swelling management.   PRECAUTIONS: No AROM for Left Shoulder until 4 weeks   SUBJECTIVE: Pt reports ongoing soreness in left shoulder   PAIN:  Are you having pain? Yes: NPRS scale: 5/10 Pain location: Anterior left shoulder  Pain description: aCHY Aggravating factors: Liting Relieving factors: Keeping it still    OBJECTIVE: (objective measures completed at initial evaluation unless otherwise dated)         VITALS: BP 125/67 HR 80 SpO2 100     DIAGNOSTIC FINDINGS:  CLINICAL  DATA:  Shoulder pain   EXAM: RIGHT SHOULDER - 3 VIEW   COMPARISON:  None.   FINDINGS: There is no evidence of fracture or dislocation. Mild degenerative changes of the acromioclavicular joint. Bone anchors of the proximal humerus. Soft tissues are unremarkable.   IMPRESSION: No acute osseous abnormality.     Electronically Signed   By: Yetta Glassman M.D.   On: 02/15/2021 12:15     PATIENT SURVEYS:  FOTO 25/100 with target of 53    COGNITION:           Overall cognitive status: Within functional limits for tasks assessed                                  SENSATION: WFL    POSTURE: Normal posture    UPPER EXTREMITY ROM:                                                                     Cervical: Not performed during eval                               AROM                                            Flex    45                                           Ext      45                        Lat Side Bend R/L 45/45                       Rotation       R/L     60/60       Active ROM Right eval Left Eval/ All PROM  Shoulder flexion 180 70*  Shoulder extension      Shoulder abduction 180  80*  Shoulder adduction      Shoulder internal rotation 70 70  Shoulder external rotation 90 30*  Elbow flexion 150 150  Elbow extension 60 60  Wrist flexion 80 80  Wrist extension 70 70  Wrist ulnar deviation 30 30  Wrist radial deviation 20 20  Wrist pronation      Wrist supination      (Blank rows = not tested)             UPPER EXTREMITY MMT:   MMT Right eval Left eval  Shoulder flexion      Shoulder extension      Shoulder abduction      Shoulder adduction      Shoulder internal rotation      Shoulder external rotation      Middle trapezius      Lower trapezius  Elbow flexion      Elbow extension      Wrist flexion   4+  Wrist extension   4+  Wrist ulnar deviation   4+  Wrist radial deviation   4+  Wrist pronation      Wrist supination      Grip strength (lbs)   Fair   (Blank rows = not tested)       JOINT MOBILITY TESTING:  Not performed    PALPATION:  Not performed              TODAY'S TREATMENT:   09/07/21                MANUAL                        Trigger point release of bilateral upper traps                        Upper Trap Stretch                         Left Biceps Stretch                         Left Biceps and forearm trigger point release     THEREX  Shoulder Pulleys AAROM Flex/ Ext  with RUE active only and LUE PROM 3 x 10  Shoulder Pulleys AAROM Abd/ Add  with RUE active only and LUE PROM 3 x 10   Supine Shoulder Flexion/Extension with RUE supporting LUE 3 x 10  Supine Shoulder Abd/ Add on LUE with yard stick 3 x 10           Shoulder Flexion Isometric on LUE 5 sec hold x 10             -Pt reports NPS of 6/10           Shoulder ER Isometric on LUE 5 sec hold x 10   Initial  Shoulder Flexion and Extension Pendulums 3 x 10  Shoulder Horizontal Pendulums 3 x 10  Shoulder Pulleys AAROM Flex/ Ext  with RUE active only and LUE PROM 3 x 10  Shoulder Pulleys AAROM Abd/ Add  with RUE active only and LUE PROM 3 x 10    PATIENT EDUCATION: Education details: form and technique for appropriate exercise and explanation of plan of care.  Person educated: Patient Education method: Customer service manager Education comprehension: verbalized understanding and returned demonstration     HOME EXERCISE PROGRAM: Access Code: [email or web address] URL: [email or web address] Date: 09/07/2021 Prepared by: Bradly Chris  Exercises - Supine Shoulder Flexion AAROM with Hands Clasped  - 7 x weekly - 3 sets - 10 reps - Seated Scapular Retraction  - 3 x weekly - 3 sets - 10 reps - 1-2 hold - Supine Shoulder Abduction AAROM with [place]  - 1 x daily - 3 sets - 10 reps - Seated Gentle Upper Trapezius Stretch  - 1 x daily - 3 reps - 30 hold   ASSESSMENT:   CLINICAL IMPRESSION: Patient is a 66 y.o. white female who was seen today for physical therapy evaluation and treatment for s/p 2 weeks left rtc repair with subacromial decompression. She exhibits decreased left shoulder ROM and strength relative to right shoulder along with increased pain. She will benefit from skilled PT to restore left shoulder ROM and strength to be  symmetrical to right shoulder to be able to reach up to UE activities such as lifting and reaching for objects to perform ADLs such as cleaning, cooking, or bathing. PT to communicate with medical team to determine whether orthopedist permits proceeding with AROM sooner than 4  weeks, otherwise will hold start of PT until week 4.      OBJECTIVE IMPAIRMENTS decreased ROM, decreased strength, impaired UE functional use, and pain.    ACTIVITY LIMITATIONS lifting, dressing, reach over head, and hygiene/grooming   PARTICIPATION LIMITATIONS: cleaning, laundry, driving, and shopping   PERSONAL FACTORS  Diabetes mellitus  are also affecting patient's functional outcome.    REHAB POTENTIAL: Good   CLINICAL DECISION MAKING: Stable/uncomplicated   EVALUATION COMPLEXITY: Low     GOALS: Goals reviewed with patient? No   SHORT TERM GOALS: Target date: 09/14/2021  (Remove Blue Hyperlink)   Pt will be independent with HEP in order to improve strength and balance in order to decrease fall risk and improve function at home and work. Baseline: NT  Goal status: Ongoing    2.  Patient will be able to remove left arm from sling for increased mobility and as a sign of healing.  Baseline:  Currently wearing sling Goal status: Ongoing    3.  Patient will achieve full PROM of left shoulder as a sign of successful healing and to progress to week 4-10 of protocol.  Baseline: Shoulder Flex R/L 180/ PROM70, Shoulder Abd R/L 180/ PROM 80, Shoulder ER R/L 90/ PROM 30 Goal status: Ongoing        LONG TERM GOALS: Target date: 11/09/2021  (Remove Blue Hyperlink)   Patient will have improved function and activity level as evidenced by an increase in FOTO score by 10 points or more.  Baseline: 23/100 with target of 100 Goal status: Ongoing    2.  Patient will exhibit symmetrical AROM between right and left shoulder to perform reaching, lifting and overhead tasks to carry out ADLs. Baseline: Shoulder Flex R/L 180/ PROM70, Shoulder Abd R/L 180/ PROM 80, Shoulder ER R/L 90/ PROM 30 Goal status: Ongoing    3.  Patient will exhibit symmetrical shoulder strength between right and left shoulder perform reaching, lifting and overhead tasks to carry out ADLs. Baseline: Unable to test left  shoulder  Goal status: Ongoing      PLAN: PT FREQUENCY: 2x/week   PT DURATION: 10 weeks   PLANNED INTERVENTIONS: Therapeutic exercises, Neuromuscular re-education, Patient/Family education, Self Care, Joint mobilization, Joint manipulation, DME instructions, Dry Needling, Electrical stimulation, Cryotherapy, Moist heat, scar mobilization, Manual therapy, and Re-evaluation   PLAN FOR NEXT SESSION: Place biceps stretch on HEP. Progress parascapular exercises and soft tissue work of surrounding musculature of left shoulder. Measure PROM and if full then progress to AROM exercises       Bradly Chris PT, DPT  09/07/2021, 12:36 PM

## 2021-09-14 ENCOUNTER — Encounter: Payer: Self-pay | Admitting: Physical Therapy

## 2021-09-14 ENCOUNTER — Ambulatory Visit: Payer: Managed Care, Other (non HMO) | Attending: Physician Assistant | Admitting: Physical Therapy

## 2021-09-14 DIAGNOSIS — M6281 Muscle weakness (generalized): Secondary | ICD-10-CM | POA: Insufficient documentation

## 2021-09-14 DIAGNOSIS — Z9889 Other specified postprocedural states: Secondary | ICD-10-CM | POA: Insufficient documentation

## 2021-09-14 DIAGNOSIS — R262 Difficulty in walking, not elsewhere classified: Secondary | ICD-10-CM | POA: Diagnosis present

## 2021-09-14 DIAGNOSIS — M79622 Pain in left upper arm: Secondary | ICD-10-CM | POA: Diagnosis present

## 2021-09-14 NOTE — Therapy (Signed)
OUTPATIENT PHYSICAL THERAPY TREATMENT NOTE   Patient Name: Tina Stephenson MRN: [id number] DOB:04/06/1955, 66 y.o., female Today's Date: 09/14/2021  PCP: Delsa Grana PA-C  REFERRING PROVIDER: Dwana Melena PA-C  END OF SESSION:   PT End of Session - 09/14/21 1143     Visit Number 3    Number of Visits 20    Date for PT Re-Evaluation 11/09/21    Authorization Type [employer] [id number]    Authorization - Visit Number 3    Authorization - Number of Visits 60    Progress Note Due on Visit 10    PT Start Time 1140    PT Stop Time 8182    PT Time Calculation (min) 40 min    Activity Tolerance Patient limited by pain    Behavior During Therapy [facility] for tasks assessed/performed             Past Medical History:  Diagnosis Date   Bronchitis    Depression    Diabetes mellitus without complication ([place])    Gallbladder polyp    GERD (gastroesophageal reflux disease)    Hypertension    Hypertensive disorder 03/05/2015   Liver tumor (benign)    Sacrum and coccyx fracture ([place])    Sciatica    Past Surgical History:  Procedure Laterality Date   APPENDECTOMY     NASAL SEPTUM SURGERY     WISDOM TOOTH EXTRACTION     Patient Active Problem List   Diagnosis Date Noted   Nontraumatic complete tear of left rotator cuff 08/15/2021   Nontraumatic complete tear of right rotator cuff 07/29/2021   Upper respiratory infection, acute 12/20/2020   Bruised toe 12/20/2020   Traumatic tear of supraspinatus tendon of right shoulder 09/24/2020   Tendinopathy of right biceps tendon 09/24/2020   Traumatic tear of supraspinatus tendon of left shoulder 09/24/2020   Leg swelling 08/02/2020   Dyslipidemia 01/05/2020   Cervical polyp 08/22/2018   Nocturia 08/22/2018   Intertrigo 08/22/2018   Type 2 diabetes mellitus without complication, without long-term current use of insulin ([place]) 08/22/2018   Cervical spine arthritis with nerve pain 06/25/2018   Herniated disc, cervical 06/25/2018   History of migraine  03/25/2018   Scoliosis 03/25/2018   History of cyst of breast 03/25/2018   Sacrum and coccyx fracture, sequela 02/21/2018   Class 3 severe obesity due to excess calories without serious comorbidity with body mass index (BMI) of 45.0 to 49.9 in adult ([place]) 02/21/2018   Onychomycosis 02/21/2018   History of hypertension 02/21/2018   Vitamin D deficiency 03/19/2015   Essential hypertension 03/05/2015    REFERRING DIAG: Z98.890 (ICD-10-CM) - H/O repair of rotator cuff  THERAPY DIAG:  Pain in left upper arm  S/P left rotator cuff repair  Rationale for Evaluation and Treatment Rehabilitation  PERTINENT HISTORY: S/p left rtc repair and subacrominal decompression on 08/18/21. H/o same surgery on right shoulder several years ago with successful recovery. She has been icing repeatedly throughout the day for pain and swelling management.   PRECAUTIONS: No AROM for Left Shoulder until 4 weeks   SUBJECTIVE: Pt reports increased left shoulder pain with AAROM. She was able to do all exercises on her HEP.   PAIN:  Are you having pain? Yes: NPRS scale: 5/10 Pain location: Anterior left shoulder  Pain description: aCHY Aggravating factors: Liting Relieving factors: Keeping it still    OBJECTIVE: (objective measures completed at initial evaluation unless otherwise dated)         VITALS: BP 125/67 HR  80 SpO2 100     DIAGNOSTIC FINDINGS:  CLINICAL DATA:  Shoulder pain   EXAM: RIGHT SHOULDER - 3 VIEW   COMPARISON:  None.   FINDINGS: There is no evidence of fracture or dislocation. Mild degenerative changes of the acromioclavicular joint. Bone anchors of the proximal humerus. Soft tissues are unremarkable.   IMPRESSION: No acute osseous abnormality.     Electronically Signed   By: Yetta Glassman M.D.   On: 02/15/2021 12:15     PATIENT SURVEYS:  FOTO 25/100 with target of 53    COGNITION:           Overall cognitive status: Within functional limits for tasks assessed                                   SENSATION: WFL   POSTURE: Normal posture    UPPER EXTREMITY ROM:                                                                     Cervical: Not performed during eval                               AROM                                            Flex    45                                           Ext      45                        Lat Side Bend R/L 45/45                       Rotation       R/L     60/60       Active ROM Right eval Left Eval/ All PROM  Shoulder flexion 180 70*  Shoulder extension      Shoulder abduction 180  80*  Shoulder adduction      Shoulder internal rotation 70 70  Shoulder external rotation 90 30*  Elbow flexion 150 150  Elbow extension 60 60  Wrist flexion 80 80  Wrist extension 70 70  Wrist ulnar deviation 30 30  Wrist radial deviation 20 20  Wrist pronation      Wrist supination      (Blank rows = not tested)             UPPER EXTREMITY MMT:   MMT Right eval Left eval  Shoulder flexion      Shoulder extension      Shoulder abduction      Shoulder adduction      Shoulder internal rotation      Shoulder external rotation      Middle trapezius  Lower trapezius      Elbow flexion      Elbow extension      Wrist flexion   4+  Wrist extension   4+  Wrist ulnar deviation   4+  Wrist radial deviation   4+  Wrist pronation      Wrist supination      Grip strength (lbs)   Fair   (Blank rows = not tested)       JOINT MOBILITY TESTING:  Not performed    PALPATION:  Not performed              TODAY'S TREATMENT:   09/14/21  THEREX   Shoulder Pulleys AAROM Flex/ Ext  with RUE active only and LUE PROM 1 x 15  Shoulder Pulleys AAROM Abd/ Add  with RUE active only and LUE PROM 1 x 15 Supine LUE Shoulder Flexion AAROM with RUE 2 x 10  -Pt able to perform shoulder flexion to 90 degrees  Supine LUE Shoulder ER AAROM with wand 3 x 10  Supine LUE Shoulder Abduction AAROM with wand 3 x 10    MANUAL    Left upper trap massage             Suboccipital release             Paraspinal massage            Left Bicep Stretch      09/07/21                MANUAL                        Trigger point release of bilateral upper traps                        Upper Trap Stretch                         Left Biceps Stretch                         Left Biceps and forearm trigger point release     THEREX  Shoulder Pulleys AAROM Flex/ Ext  with RUE active only and LUE PROM 3 x 10  Shoulder Pulleys AAROM Abd/ Add  with RUE active only and LUE PROM 3 x 10  Supine Shoulder Flexion/Extension with RUE supporting LUE 3 x 10  Supine Shoulder Abd/ Add on LUE with yard stick 3 x 10           Shoulder Flexion Isometric on LUE 5 sec hold x 10             -Pt reports NPS of 6/10           Shoulder ER Isometric on LUE 5 sec hold x 10   Initial  Shoulder Flexion and Extension Pendulums 3 x 10  Shoulder Horizontal Pendulums 3 x 10  Shoulder Pulleys AAROM Flex/ Ext  with RUE active only and LUE PROM 3 x 10  Shoulder Pulleys AAROM Abd/ Add  with RUE active only and LUE PROM 3 x 10    PATIENT EDUCATION: Education details: form and technique for appropriate exercise and explanation of plan of care.  Person educated: Patient Education method: Customer service manager Education comprehension: verbalized understanding and returned demonstration     HOME EXERCISE PROGRAM: Access Code: [email or web address] URL: [email or web address] Date:  09/14/2021 Prepared by: Bradly Chris  Exercises - Supine Shoulder Flexion AAROM with Hands Clasped  - 7 x weekly - 3 sets - 10 reps - Supine Shoulder Abduction AAROM with Dowel  - 1 x daily - 3 sets - 10 reps - Seated Gentle Upper Trapezius Stretch  - 1 x daily - 3 reps - 30 hold - Supine Shoulder External Rotation with Dowel  - 1 x daily - 7 x weekly - 3 sets - 10 reps - Seated Shoulder Row with Anchored Resistance  - 3 x weekly - 3 sets - 10 reps -Biceps Stretch  30 sec x 3 x 7 days per week    ASSESSMENT:   CLINICAL IMPRESSION:   Pt exhibits improvement in left shoulder PROM and parascapular strength with ability to perform seated rows with increased resistance and an increased number of AAROM shoulder flexion reps to 90 degrees of flexion. She also shows decreased muscular tension in left side of surrounding musculature of cervical and parascapular region. She will continue to benefit from skilled PT to restore left shoulder ROM and strength to be symmetrical to right shoulder to be able to reach up to UE activities such as lifting and reaching for objects to perform ADLs such as cleaning, cooking, or bathing.   OBJECTIVE IMPAIRMENTS decreased ROM, decreased strength, impaired UE functional use, and pain.    ACTIVITY LIMITATIONS lifting, dressing, reach over head, and hygiene/grooming   PARTICIPATION LIMITATIONS: cleaning, laundry, driving, and shopping   PERSONAL FACTORS  Diabetes mellitus  are also affecting patient's functional outcome.    REHAB POTENTIAL: Good   CLINICAL DECISION MAKING: Stable/uncomplicated   EVALUATION COMPLEXITY: Low     GOALS: Goals reviewed with patient? No   SHORT TERM GOALS: Target date: 09/14/2021  (Remove Blue Hyperlink)   Pt will be independent with HEP in order to improve strength and balance in order to decrease fall risk and improve function at home and work. Baseline: NT  Goal status: Ongoing    2.  Patient will be able to remove left arm from sling for increased mobility and as a sign of healing.  Baseline:  Currently wearing sling Goal status: Ongoing    3.  Patient will achieve full PROM of left shoulder as a sign of successful healing and to progress to week 4-10 of protocol.  Baseline: Shoulder Flex R/L 180/ PROM70, Shoulder Abd R/L 180/ PROM 80, Shoulder ER R/L 90/ PROM 30 Goal status: Ongoing        LONG TERM GOALS: Target date: 11/09/2021  (Remove Blue Hyperlink)   Patient will have  improved function and activity level as evidenced by an increase in FOTO score by 10 points or more.  Baseline: 23/100 with target of 100 Goal status: Ongoing    2.  Patient will exhibit symmetrical AROM between right and left shoulder to perform reaching, lifting and overhead tasks to carry out ADLs. Baseline: Shoulder Flex R/L 180/ PROM70, Shoulder Abd R/L 180/ PROM 80, Shoulder ER R/L 90/ PROM 30 Goal status: Ongoing    3.  Patient will exhibit symmetrical shoulder strength between right and left shoulder perform reaching, lifting and overhead tasks to carry out ADLs. Baseline: Unable to test left shoulder  Goal status: Ongoing      PLAN: PT FREQUENCY: 2x/week   PT DURATION: 10 weeks   PLANNED INTERVENTIONS: Therapeutic exercises, Neuromuscular re-education, Patient/Family education, Self Care, Joint mobilization, Joint manipulation, DME instructions, Dry Needling, Electrical stimulation, Cryotherapy, Moist heat, scar mobilization, Manual  therapy, and Re-evaluation   PLAN FOR NEXT SESSION:  Progress parascapular exercises and soft tissue work of surrounding musculature of left shoulder. Measure PROM and if full then progress to AROM exercises       Bradly Chris PT, DPT  09/14/2021, 11:44 AM

## 2021-09-19 ENCOUNTER — Ambulatory Visit: Payer: Managed Care, Other (non HMO) | Admitting: Physical Therapy

## 2021-09-19 ENCOUNTER — Encounter: Payer: Self-pay | Admitting: Physical Therapy

## 2021-09-19 DIAGNOSIS — Z9889 Other specified postprocedural states: Secondary | ICD-10-CM

## 2021-09-19 DIAGNOSIS — M79622 Pain in left upper arm: Secondary | ICD-10-CM | POA: Diagnosis not present

## 2021-09-19 NOTE — Therapy (Signed)
OUTPATIENT PHYSICAL THERAPY TREATMENT NOTE   Patient Name: Tina Stephenson MRN: [id number] DOB:July 31, 1955, 66 y.o., female Today's Date: 09/19/2021  PCP: Delsa Grana PA-C  REFERRING PROVIDER: Dwana Melena PA-C  END OF SESSION:   PT End of Session - 09/19/21 1417     Visit Number 4    Number of Visits 20    Date for PT Re-Evaluation 11/09/21    Authorization Type [employer] 2023    Authorization - Visit Number 4    Authorization - Number of Visits 60    Progress Note Due on Visit 10    PT Start Time 1601    PT Stop Time 1500    PT Time Calculation (min) 45 min    Activity Tolerance Patient limited by pain    Behavior During Therapy [facility] for tasks assessed/performed             Past Medical History:  Diagnosis Date   Bronchitis    Depression    Diabetes mellitus without complication ([place])    Gallbladder polyp    GERD (gastroesophageal reflux disease)    Hypertension    Hypertensive disorder 03/05/2015   Liver tumor (benign)    Sacrum and coccyx fracture ([place])    Sciatica    Past Surgical History:  Procedure Laterality Date   APPENDECTOMY     NASAL SEPTUM SURGERY     WISDOM TOOTH EXTRACTION     Patient Active Problem List   Diagnosis Date Noted   Nontraumatic complete tear of left rotator cuff 08/15/2021   Nontraumatic complete tear of right rotator cuff 07/29/2021   Upper respiratory infection, acute 12/20/2020   Bruised toe 12/20/2020   Traumatic tear of supraspinatus tendon of right shoulder 09/24/2020   Tendinopathy of right biceps tendon 09/24/2020   Traumatic tear of supraspinatus tendon of left shoulder 09/24/2020   Leg swelling 08/02/2020   Dyslipidemia 01/05/2020   Cervical polyp 08/22/2018   Nocturia 08/22/2018   Intertrigo 08/22/2018   Type 2 diabetes mellitus without complication, without long-term current use of insulin ([place]) 08/22/2018   Cervical spine arthritis with nerve pain 06/25/2018   Herniated disc, cervical 06/25/2018   History of migraine  03/25/2018   Scoliosis 03/25/2018   History of cyst of breast 03/25/2018   Sacrum and coccyx fracture, sequela 02/21/2018   Class 3 severe obesity due to excess calories without serious comorbidity with body mass index (BMI) of 45.0 to 49.9 in adult ([place]) 02/21/2018   Onychomycosis 02/21/2018   History of hypertension 02/21/2018   Vitamin D deficiency 03/19/2015   Essential hypertension 03/05/2015    REFERRING DIAG: Z98.890 (ICD-10-CM) - H/O repair of rotator cuff  THERAPY DIAG:  Pain in left upper arm  S/P left rotator cuff repair  Rationale for Evaluation and Treatment Rehabilitation  PERTINENT HISTORY: S/p left rtc repair and subacrominal decompression on 08/18/21. H/o same surgery on right shoulder several years ago with successful recovery. She has been icing repeatedly throughout the day for pain and swelling management.   PRECAUTIONS: No AROM for Left Shoulder until 4 weeks   SUBJECTIVE: Pt reports ongoing pain with some exercises especially abduction.  PAIN:  Are you having pain? Yes: NPRS scale: 5/10 Pain location: Anterior left shoulder  Pain description: aCHY Aggravating factors: Liting Relieving factors: Keeping it still    OBJECTIVE: (objective measures completed at initial evaluation unless otherwise dated)         VITALS: BP 125/67 HR 80 SpO2 100     DIAGNOSTIC FINDINGS:  CLINICAL DATA:  Shoulder pain   EXAM: RIGHT SHOULDER - 3 VIEW   COMPARISON:  None.   FINDINGS: There is no evidence of fracture or dislocation. Mild degenerative changes of the acromioclavicular joint. Bone anchors of the proximal humerus. Soft tissues are unremarkable.   IMPRESSION: No acute osseous abnormality.     Electronically Signed   By: Yetta Glassman M.D.   On: 02/15/2021 12:15     PATIENT SURVEYS:  FOTO 25/100 with target of 53    COGNITION:           Overall cognitive status: Within functional limits for tasks assessed                                   SENSATION: WFL   POSTURE: Normal posture    UPPER EXTREMITY ROM:                                                                     Cervical: Not performed during eval                               AROM                                            Flex    45                                           Ext      45                        Lat Side Bend R/L 45/45                       Rotation       R/L     60/60       Active ROM Right eval Left Eval/ All PROM  Shoulder flexion 180 70*  Shoulder extension      Shoulder abduction 180  80*  Shoulder adduction      Shoulder internal rotation 70 70  Shoulder external rotation 90 30*  Elbow flexion 150 150  Elbow extension 60 60  Wrist flexion 80 80  Wrist extension 70 70  Wrist ulnar deviation 30 30  Wrist radial deviation 20 20  Wrist pronation      Wrist supination      (Blank rows = not tested)             UPPER EXTREMITY MMT:   MMT Right eval Left eval  Shoulder flexion      Shoulder extension      Shoulder abduction      Shoulder adduction      Shoulder internal rotation      Shoulder external rotation      Middle trapezius      Lower trapezius  Elbow flexion      Elbow extension      Wrist flexion   4+  Wrist extension   4+  Wrist ulnar deviation   4+  Wrist radial deviation   4+  Wrist pronation      Wrist supination      Grip strength (lbs)   Fair   (Blank rows = not tested)       JOINT MOBILITY TESTING:  Not performed    PALPATION:  Not performed              TODAY'S TREATMENT:   09/19/21  Shoulder Pulleys AAROM Flex/ Ext  with RUE active only and LUE PROM 2 x 10  Shoulder Pulleys AAROM Abd/ Add  with RUE active only and LUE PROM 2 x 10 Supine Shoulder Flexion PROM LUE 1 x 10  Supine Shoulder Abduction PROM LUE 1x 10  -8/10 NPS  Seated Shoulder Flexion PROM LUE 1 x 10  Wall Walks Abduction with LUE 2 x 10 Seated Shoulder Flexion Towel Slides with LUE 3 x 10    Standing Shoulder  Flexion Towel Slides with LUE 3 x 10  Seated Shoulder Abduction Towel Slides with LUE 3 X 10  Standing Shoulder Abduction Towel Slides with LUE 3 x 10  Shoulder IR/ER with LUE at 0 deg abduction with #1 DB 3 x 10   09/14/21  THEREX   Shoulder Pulleys AAROM Flex/ Ext  with RUE active only and LUE PROM 1 x 15  Shoulder Pulleys AAROM Abd/ Add  with RUE active only and LUE PROM 1 x 15 Supine LUE Shoulder Flexion AAROM with RUE 2 x 10  -Pt able to perform shoulder flexion to 90 degrees  Supine LUE Shoulder ER AAROM with wand 3 x 10  Supine LUE Shoulder Abduction AAROM with wand 3 x 10    MANUAL   Left upper trap massage             Suboccipital release             Paraspinal massage            Left Bicep Stretch      09/07/21                MANUAL                        Trigger point release of bilateral upper traps                        Upper Trap Stretch                         Left Biceps Stretch                         Left Biceps and forearm trigger point release     THEREX  Shoulder Pulleys AAROM Flex/ Ext  with RUE active only and LUE PROM 3 x 10  Shoulder Pulleys AAROM Abd/ Add  with RUE active only and LUE PROM 3 x 10  Supine Shoulder Flexion/Extension with RUE supporting LUE 3 x 10  Supine Shoulder Abd/ Add on LUE with yard stick 3 x 10           Shoulder Flexion Isometric on LUE 5 sec hold x 10             -  Pt reports NPS of 6/10           Shoulder ER Isometric on LUE 5 sec hold x 10   Initial  Shoulder Flexion and Extension Pendulums 3 x 10  Shoulder Horizontal Pendulums 3 x 10  Shoulder Pulleys AAROM Flex/ Ext  with RUE active only and LUE PROM 3 x 10  Shoulder Pulleys AAROM Abd/ Add  with RUE active only and LUE PROM 3 x 10    PATIENT EDUCATION: Education details: form and technique for appropriate exercise and explanation of plan of care.  Person educated: Patient Education method: Customer service manager Education comprehension: verbalized understanding  and returned demonstration     HOME EXERCISE PROGRAM: Access Code: [email or web address] URL: [email or web address] Date: 09/19/2021 Prepared by: Bradly Chris  Exercises - Supine Shoulder Abduction AAROM with Dowel  - 1 x daily - 3 sets - 10 reps - Seated Gentle Upper Trapezius Stretch  - 1 x daily - 3 reps - 30 hold - Supine Shoulder External Rotation with Dowel  - 1 x daily - 7 x weekly - 3 sets - 10 reps - Seated Shoulder Row with Anchored Resistance  - 3 x weekly - 3 sets - 10 reps - Standing Single Arm Shoulder Flexion Towel Slide at Table Top  - 1 x daily - 7 x weekly - 3 sets - 10 reps - Shoulder Abduction Towel Slide at Table Top  - 1 x daily - 7 x weekly - 3 sets - 10 reps - Seated Shoulder Flexion Self PROM  - 1 x daily - 7 x weekly - 3 sets - 10 reps - Shoulder External Rotation and Scapular Retraction with Resistance  - 1 x daily - 3 x weekly - 3 sets - 10 reps   ASSESSMENT:   CLINICAL IMPRESSION:  Pt exhibits improvement in shoulder strength with her starting to perform AROM exercises with shoulder ER at 0 degrees abduction and shoulder slides in abduction and flexion. She will continue to benefit from skilled PT to restore left shoulder ROM and strength to be symmetrical to right shoulder to be able to reach up to UE activities such as lifting and reaching for objects to perform ADLs such as cleaning, cooking, or bathing.   OBJECTIVE IMPAIRMENTS decreased ROM, decreased strength, impaired UE functional use, and pain.    ACTIVITY LIMITATIONS lifting, dressing, reach over head, and hygiene/grooming   PARTICIPATION LIMITATIONS: cleaning, laundry, driving, and shopping   PERSONAL FACTORS  Diabetes mellitus  are also affecting patient's functional outcome.    REHAB POTENTIAL: Good   CLINICAL DECISION MAKING: Stable/uncomplicated   EVALUATION COMPLEXITY: Low     GOALS: Goals reviewed with patient? No   SHORT TERM GOALS: Target date: 09/14/2021  (Remove Blue  Hyperlink)   Pt will be independent with HEP in order to improve strength and balance in order to decrease fall risk and improve function at home and work. Baseline: NT  Goal status: Ongoing    2.  Patient will be able to remove left arm from sling for increased mobility and as a sign of healing.  Baseline:  Currently wearing sling Goal status: Ongoing    3.  Patient will achieve full PROM of left shoulder as a sign of successful healing and to progress to week 4-10 of protocol.  Baseline: Shoulder Flex R/L 180/ PROM70, Shoulder Abd R/L 180/ PROM 80, Shoulder ER R/L 90/ PROM 30 Goal status: Ongoing        LONG TERM GOALS: Target  date: 11/09/2021  (Remove Blue Hyperlink)   Patient will have improved function and activity level as evidenced by an increase in FOTO score by 10 points or more.  Baseline: 23/100 with target of 100 Goal status: Ongoing    2.  Patient will exhibit symmetrical AROM between right and left shoulder to perform reaching, lifting and overhead tasks to carry out ADLs. Baseline: Shoulder Flex R/L 180/ PROM70, Shoulder Abd R/L 180/ PROM 80, Shoulder ER R/L 90/ PROM 30 Goal status: Ongoing    3.  Patient will exhibit symmetrical shoulder strength between right and left shoulder perform reaching, lifting and overhead tasks to carry out ADLs. Baseline: Unable to test left shoulder  Goal status: Ongoing      PLAN: PT FREQUENCY: 2x/week   PT DURATION: 10 weeks   PLANNED INTERVENTIONS: Therapeutic exercises, Neuromuscular re-education, Patient/Family education, Self Care, Joint mobilization, Joint manipulation, DME instructions, Dry Needling, Electrical stimulation, Cryotherapy, Moist heat, scar mobilization, Manual therapy, and Re-evaluation   PLAN FOR NEXT SESSION:  Progress parascapular exercises and soft tissue work of surrounding musculature of left shoulder. Measure PROM and if full then progress to AROM exercises       Bradly Chris PT, DPT  09/19/2021,  2:18 PM

## 2021-09-21 ENCOUNTER — Ambulatory Visit: Payer: Managed Care, Other (non HMO) | Admitting: Physical Therapy

## 2021-09-21 ENCOUNTER — Encounter: Payer: Self-pay | Admitting: Physical Therapy

## 2021-09-21 DIAGNOSIS — Z9889 Other specified postprocedural states: Secondary | ICD-10-CM

## 2021-09-21 DIAGNOSIS — M79622 Pain in left upper arm: Secondary | ICD-10-CM

## 2021-09-21 NOTE — Therapy (Addendum)
OUTPATIENT PHYSICAL THERAPY TREATMENT NOTE   Patient Name: Tina Stephenson MRN: [id number] DOB:[birth year], 66 y.o., female Today's Date: 09/21/2021  PCP: Delsa Grana PA-C  REFERRING PROVIDER: Dwana Melena PA-C  END OF SESSION:   PT End of Session - 09/21/21 1422     Visit Number 5    Number of Visits 20    Date for PT Re-Evaluation 11/09/21    Authorization Type [employer] 2023    Authorization - Visit Number 5    Authorization - Number of Visits 60    Progress Note Due on Visit 10    PT Start Time 6269    PT Stop Time 1500    PT Time Calculation (min) 40 min    Activity Tolerance Patient limited by pain    Behavior During Therapy [facility] for tasks assessed/performed             Past Medical History:  Diagnosis Date   Bronchitis    Depression    Diabetes mellitus without complication ([place])    Gallbladder polyp    GERD (gastroesophageal reflux disease)    Hypertension    Hypertensive disorder 03/05/2015   Liver tumor (benign)    Sacrum and coccyx fracture ([place])    Sciatica    Past Surgical History:  Procedure Laterality Date   APPENDECTOMY     NASAL SEPTUM SURGERY     WISDOM TOOTH EXTRACTION     Patient Active Problem List   Diagnosis Date Noted   Nontraumatic complete tear of left rotator cuff 08/15/2021   Nontraumatic complete tear of right rotator cuff 07/29/2021   Upper respiratory infection, acute 12/20/2020   Bruised toe 12/20/2020   Traumatic tear of supraspinatus tendon of right shoulder 09/24/2020   Tendinopathy of right biceps tendon 09/24/2020   Traumatic tear of supraspinatus tendon of left shoulder 09/24/2020   Leg swelling 08/02/2020   Dyslipidemia 01/05/2020   Cervical polyp 08/22/2018   Nocturia 08/22/2018   Intertrigo 08/22/2018   Type 2 diabetes mellitus without complication, without long-term current use of insulin ([place]) 08/22/2018   Cervical spine arthritis with nerve pain 06/25/2018   Herniated disc, cervical 06/25/2018   History of migraine  03/25/2018   Scoliosis 03/25/2018   History of cyst of breast 03/25/2018   Sacrum and coccyx fracture, sequela 02/21/2018   Class 3 severe obesity due to excess calories without serious comorbidity with body mass index (BMI) of 45.0 to 49.9 in adult ([place]) 02/21/2018   Onychomycosis 02/21/2018   History of hypertension 02/21/2018   Vitamin D deficiency 03/19/2015   Essential hypertension 03/05/2015    REFERRING DIAG: Z98.890 (ICD-10-CM) - H/O repair of rotator cuff  THERAPY DIAG:  Pain in left upper arm  S/P left rotator cuff repair  Rationale for Evaluation and Treatment Rehabilitation  PERTINENT HISTORY: S/p left rtc repair and subacrominal decompression on 08/18/21. H/o same surgery on right shoulder several years ago with successful recovery. She has been icing repeatedly throughout the day for pain and swelling management.   PRECAUTIONS: No AROM for Left Shoulder until 4 weeks   SUBJECTIVE: Pt reports that exercises have been going well, but she did experience a spasm in her left shoulder while she was laying down to sleep that was painful. She took Tylenol before appointment so she is feeling less pain.  PAIN:  Are you having pain? Yes: NPRS scale: 2-3/10 Pain location: Anterior left shoulder  Pain description: aCHY Aggravating factors: Liting Relieving factors: Keeping it still    OBJECTIVE: (objective  measures completed at initial evaluation unless otherwise dated)         VITALS: BP 125/67 HR 80 SpO2 100     DIAGNOSTIC FINDINGS:  CLINICAL DATA:  Shoulder pain   EXAM: RIGHT SHOULDER - 3 VIEW   COMPARISON:  None.   FINDINGS: There is no evidence of fracture or dislocation. Mild degenerative changes of the acromioclavicular joint. Bone anchors of the proximal humerus. Soft tissues are unremarkable.   IMPRESSION: No acute osseous abnormality.     Electronically Signed   By: Yetta Glassman M.D.   On: 02/15/2021 12:15     PATIENT SURVEYS:  FOTO 25/100  with target of 53    COGNITION:           Overall cognitive status: Within functional limits for tasks assessed                                  SENSATION: WFL   POSTURE: Normal posture    UPPER EXTREMITY ROM:                                                                     Cervical: Not performed during eval                               AROM                                            Flex    45                                           Ext      45                        Lat Side Bend R/L 45/45                       Rotation       R/L     60/60       Active ROM Right eval Left Eval/ All PROM  Shoulder flexion 180 70*  Shoulder extension      Shoulder abduction 180  80*  Shoulder adduction      Shoulder internal rotation 70 70  Shoulder external rotation 90 30*  Elbow flexion 150 150  Elbow extension 60 60  Wrist flexion 80 80  Wrist extension 70 70  Wrist ulnar deviation 30 30  Wrist radial deviation 20 20  Wrist pronation      Wrist supination      (Blank rows = not tested)             UPPER EXTREMITY MMT:   MMT Right eval Left eval  Shoulder flexion      Shoulder extension      Shoulder abduction      Shoulder adduction  Shoulder internal rotation      Shoulder external rotation      Middle trapezius      Lower trapezius      Elbow flexion      Elbow extension      Wrist flexion   4+  Wrist extension   4+  Wrist ulnar deviation   4+  Wrist radial deviation   4+  Wrist pronation      Wrist supination      Grip strength (lbs)   Fair   (Blank rows = not tested)       JOINT MOBILITY TESTING:  Not performed    PALPATION:  Not performed              TODAY'S TREATMENT:   09/21/21          THEREX  Shoulder Pulleys AAROM Flex/ Ext  with RUE active only and LUE PROM 2 x 10  Shoulder Pulleys AAROM Abd/ Add  with RUE active only and LUE PROM 2 x 10 Seated Rows with Black TB 3 x 10 Seated Rows with Gray TB 1 x 10   Wall Walks  Abduction with LUE 1 x 10 -Increased pain to NPS 5/10  Standing Shoulder Flexion Towel Slides with LUE 3 x 10  Standing Shoulder Abduction Towel Slides with LUE 3 x 10  Shoulder ER on LUE Red band 2 x 10   MANUAL  Upper trap trigger point release on right and left  Upper trap stretch right and left                09/19/21  Shoulder Pulleys AAROM Flex/ Ext  with RUE active only and LUE PROM 2 x 10  Shoulder Pulleys AAROM Abd/ Add  with RUE active only and LUE PROM 2 x 10 Supine Shoulder Flexion PROM LUE 1 x 10  Supine Shoulder Abduction PROM LUE 1x 10  -8/10 NPS  Seated Shoulder Flexion PROM LUE 1 x 10  Wall Walks Abduction with LUE 2 x 10 Seated Shoulder Flexion Towel Slides with LUE 3 x 10    Standing Shoulder Flexion Towel Slides with LUE 3 x 10  Seated Shoulder Abduction Towel Slides with LUE 3 X 10  Standing Shoulder Abduction Towel Slides with LUE 3 x 10  Shoulder IR/ER with LUE at 0 deg abduction with #1 DB 3 x 10   09/14/21  THEREX   Shoulder Pulleys AAROM Flex/ Ext  with RUE active only and LUE PROM 1 x 15  Shoulder Pulleys AAROM Abd/ Add  with RUE active only and LUE PROM 1 x 15 Supine LUE Shoulder Flexion AAROM with RUE 2 x 10  -Pt able to perform shoulder flexion to 90 degrees  Supine LUE Shoulder ER AAROM with wand 3 x 10  Supine LUE Shoulder Abduction AAROM with wand 3 x 10    MANUAL   Left upper trap massage             Suboccipital release             Paraspinal massage            Left Bicep Stretch      09/07/21                MANUAL                        Trigger point release of bilateral upper traps  Upper Trap Stretch                         Left Biceps Stretch                         Left Biceps and forearm trigger point release     THEREX  Shoulder Pulleys AAROM Flex/ Ext  with RUE active only and LUE PROM 3 x 10  Shoulder Pulleys AAROM Abd/ Add  with RUE active only and LUE PROM 3 x 10  Supine Shoulder Flexion/Extension  with RUE supporting LUE 3 x 10  Supine Shoulder Abd/ Add on LUE with yard stick 3 x 10           Shoulder Flexion Isometric on LUE 5 sec hold x 10             -Pt reports NPS of 6/10           Shoulder ER Isometric on LUE 5 sec hold x 10   Initial  Shoulder Flexion and Extension Pendulums 3 x 10  Shoulder Horizontal Pendulums 3 x 10  Shoulder Pulleys AAROM Flex/ Ext  with RUE active only and LUE PROM 3 x 10  Shoulder Pulleys AAROM Abd/ Add  with RUE active only and LUE PROM 3 x 10    PATIENT EDUCATION: Education details: form and technique for appropriate exercise and explanation of plan of care.  Person educated: Patient Education method: Customer service manager Education comprehension: verbalized understanding and returned demonstration     HOME EXERCISE PROGRAM: Access Code: [email or web address] URL: [email or web address] Date: 09/21/2021 Prepared by: Bradly Chris  Exercises - Supine Shoulder Abduction AAROM with Dowel  - 1 x daily - 3 sets - 10 reps - Seated Gentle Upper Trapezius Stretch  - 1 x daily - 3 reps - 30 hold - Supine Shoulder External Rotation with Dowel  - 1 x daily - 7 x weekly - 3 sets - 10 reps - Seated Shoulder Row with Anchored Resistance  - 3 x weekly - 3 sets - 10 reps - Standing Single Arm Shoulder Flexion Towel Slide at Table Top  - 1 x daily - 7 x weekly - 3 sets - 10 reps - Shoulder Abduction Towel Slide at Table Top  - 1 x daily - 7 x weekly - 3 sets - 10 reps - Seated Shoulder Flexion Self PROM  - 1 x daily - 7 x weekly - 3 sets - 10 reps - Shoulder External Rotation and Scapular Retraction with Resistance  - 1 x daily - 3 x weekly - 3 sets - 10 reps   ASSESSMENT:   CLINICAL IMPRESSION: Pt exhibits improvement with parascapular strength with ability to perform increased resistance with seated rows and shoulder ER at 0 deg abduction without an increase in her pain. She continues to be limited in AROM with ability to only abduct or flex arm  to 30 degrees and inability to perform wall walks in flexion and abduction without a significant increase in her pain.She will continue to benefit from skilled PT to restore left shoulder ROM and strength to be symmetrical to right shoulder to be able to reach up to UE activities such as lifting and reaching for objects to perform ADLs such as cleaning, cooking, or bathing.    OBJECTIVE IMPAIRMENTS decreased ROM, decreased strength, impaired UE functional use, and pain.    ACTIVITY LIMITATIONS lifting, dressing, reach over head,  and hygiene/grooming   PARTICIPATION LIMITATIONS: cleaning, laundry, driving, and shopping   PERSONAL FACTORS  Diabetes mellitus  are also affecting patient's functional outcome.    REHAB POTENTIAL: Good   CLINICAL DECISION MAKING: Stable/uncomplicated   EVALUATION COMPLEXITY: Low     GOALS: Goals reviewed with patient? No   SHORT TERM GOALS: Target date: 09/14/2021  (Remove Blue Hyperlink)   Pt will be independent with HEP in order to improve strength and balance in order to decrease fall risk and improve function at home and work. Baseline: NT  Goal status: Ongoing    2.  Patient will be able to remove left arm from sling for increased mobility and as a sign of healing.  Baseline:  Currently wearing sling Goal status: Ongoing    3.  Patient will achieve full PROM of left shoulder as a sign of successful healing and to progress to week 4-10 of protocol.  Baseline: Shoulder Flex R/L 180/ PROM70, Shoulder Abd R/L 180/ PROM 80, Shoulder ER R/L 90/ PROM 30 Goal status: Ongoing        LONG TERM GOALS: Target date: 11/09/2021  (Remove Blue Hyperlink)   Patient will have improved function and activity level as evidenced by an increase in FOTO score by 10 points or more.  Baseline: 23/100 with target of 100 Goal status: Ongoing    2.  Patient will exhibit symmetrical AROM between right and left shoulder to perform reaching, lifting and overhead tasks to carry  out ADLs. Baseline: Shoulder Flex R/L 180/ PROM70, Shoulder Abd R/L 180/ PROM 80, Shoulder ER R/L 90/ PROM 30 Goal status: Ongoing    3.  Patient will exhibit symmetrical shoulder strength between right and left shoulder perform reaching, lifting and overhead tasks to carry out ADLs. Baseline: Unable to test left shoulder  Goal status: Ongoing      PLAN: PT FREQUENCY: 2x/week   PT DURATION: 10 weeks   PLANNED INTERVENTIONS: Therapeutic exercises, Neuromuscular re-education, Patient/Family education, Self Care, Joint mobilization, Joint manipulation, DME instructions, Dry Needling, Electrical stimulation, Cryotherapy, Moist heat, scar mobilization, Manual therapy, and Re-evaluation   PLAN FOR NEXT SESSION:  Take FOTO and measure right shoulder MMT. Progress parascapular exercises and soft tissue work of surrounding musculature of left shoulder. Measure PROM of left shoulder and continue to initiate AROM exercises       Bradly Chris PT, DPT  09/21/2021, 2:23 PM

## 2021-09-26 ENCOUNTER — Ambulatory Visit: Payer: Managed Care, Other (non HMO) | Admitting: Physical Therapy

## 2021-09-26 DIAGNOSIS — M79622 Pain in left upper arm: Secondary | ICD-10-CM

## 2021-09-26 DIAGNOSIS — Z9889 Other specified postprocedural states: Secondary | ICD-10-CM

## 2021-09-26 NOTE — Therapy (Signed)
OUTPATIENT PHYSICAL THERAPY TREATMENT NOTE   Patient Name: Tina Stephenson MRN: [id number] DOB:1955/06/14, 66 y.o., female Today's Date: 09/26/2021  PCP: Delsa Grana PA-C  REFERRING PROVIDER: Dwana Melena PA-C  END OF SESSION:   PT End of Session - 09/26/21 1430     Visit Number 6    Number of Visits 20    Date for PT Re-Evaluation 11/09/21    Authorization Type [employer] 2023    Authorization - Visit Number 6    Authorization - Number of Visits 60    Progress Note Due on Visit 10    PT Start Time 4098    PT Stop Time 1500    PT Time Calculation (min) 40 min    Activity Tolerance Patient limited by pain    Behavior During Therapy [facility] [place] for tasks assessed/performed              Past Medical History:  Diagnosis Date   Bronchitis    Depression    Diabetes mellitus without complication ([place])    Gallbladder polyp    GERD (gastroesophageal reflux disease)    Hypertension    Hypertensive disorder 03/05/2015   Liver tumor (benign)    Sacrum and coccyx fracture ([place])    Sciatica    Past Surgical History:  Procedure Laterality Date   APPENDECTOMY     NASAL SEPTUM SURGERY     WISDOM TOOTH EXTRACTION     Patient Active Problem List   Diagnosis Date Noted   Nontraumatic complete tear of left rotator cuff 08/15/2021   Nontraumatic complete tear of right rotator cuff 07/29/2021   Upper respiratory infection, acute 12/20/2020   Bruised toe 12/20/2020   Traumatic tear of supraspinatus tendon of right shoulder 09/24/2020   Tendinopathy of right biceps tendon 09/24/2020   Traumatic tear of supraspinatus tendon of left shoulder 09/24/2020   Leg swelling 08/02/2020   Dyslipidemia 01/05/2020   Cervical polyp 08/22/2018   Nocturia 08/22/2018   Intertrigo 08/22/2018   Type 2 diabetes mellitus without complication, without long-term current use of insulin ([place]) 08/22/2018   Cervical spine arthritis with nerve pain 06/25/2018   Herniated disc, cervical 06/25/2018   History of  migraine 03/25/2018   Scoliosis 03/25/2018   History of cyst of breast 03/25/2018   Sacrum and coccyx fracture, sequela 02/21/2018   Class 3 severe obesity due to excess calories without serious comorbidity with body mass index (BMI) of 45.0 to 49.9 in adult ([place]) 02/21/2018   Onychomycosis 02/21/2018   History of hypertension 02/21/2018   Vitamin D deficiency 03/19/2015   Essential hypertension 03/05/2015    REFERRING DIAG: Z98.890 (ICD-10-CM) - H/O repair of rotator cuff  THERAPY DIAG:  Pain in left upper arm  S/P left rotator cuff repair  Rationale for Evaluation and Treatment Rehabilitation  PERTINENT HISTORY: S/p left rtc repair and subacrominal decompression on 08/18/21. H/o same surgery on right shoulder several years ago with successful recovery. She has been icing repeatedly throughout the day for pain and swelling management.   PRECAUTIONS: No AROM for Left Shoulder until 4 weeks   SUBJECTIVE: Pt reports that she developed a kink in her neck yesterday and she does not seem to be able to get it out. She states exercises have been going well.   PAIN:  Are you having pain? Yes: NPRS scale: 2-3/10 Pain location: Anterior left shoulder  Pain description: aCHY Aggravating factors: Liting Relieving factors: Keeping it still    OBJECTIVE: (objective measures completed at initial evaluation unless otherwise  dated)         VITALS: BP 125/67 HR 80 SpO2 100     DIAGNOSTIC FINDINGS:  CLINICAL DATA:  Shoulder pain   EXAM: RIGHT SHOULDER - 3 VIEW   COMPARISON:  None.   FINDINGS: There is no evidence of fracture or dislocation. Mild degenerative changes of the acromioclavicular joint. Bone anchors of the proximal humerus. Soft tissues are unremarkable.   IMPRESSION: No acute osseous abnormality.     Electronically Signed   By: Yetta Glassman M.D.   On: 02/15/2021 12:15     PATIENT SURVEYS:  FOTO 25/100 with target of 53    COGNITION:           Overall  cognitive status: Within functional limits for tasks assessed                                  SENSATION: WFL   POSTURE: Normal posture    UPPER EXTREMITY ROM:                                                                     Cervical: Not performed during eval                               AROM                                            Flex    45                                           Ext      45                        Lat Side Bend R/L 45/45                       Rotation       R/L     60/60       Active ROM Right eval Left Eval/ All PROM  Shoulder flexion 180 70*  Shoulder extension      Shoulder abduction 180  80*  Shoulder adduction      Shoulder internal rotation 70 70  Shoulder external rotation 90 30*  Elbow flexion 150 150  Elbow extension 60 60  Wrist flexion 80 80  Wrist extension 70 70  Wrist ulnar deviation 30 30  Wrist radial deviation 20 20  Wrist pronation      Wrist supination      (Blank rows = not tested)             UPPER EXTREMITY MMT:   MMT Right eval Left eval  Shoulder flexion      Shoulder extension      Shoulder abduction      Shoulder adduction      Shoulder internal rotation  Shoulder external rotation      Middle trapezius      Lower trapezius      Elbow flexion      Elbow extension      Wrist flexion   4+  Wrist extension   4+  Wrist ulnar deviation   4+  Wrist radial deviation   4+  Wrist pronation      Wrist supination      Grip strength (lbs)   Fair   (Blank rows = not tested)       JOINT MOBILITY TESTING:  Not performed    PALPATION:  Not performed              TODAY'S TREATMENT:   09/26/21         THEREX  Shoulder Pulleys AAROM Flex/ Ext  with RUE active only and LUE PROM 2 x 10  Shoulder Pulleys AAROM Abd/ Add  with RUE active only and LUE PROM 2 x 10 FOTO: 28/53 Supine Shoulder Counter Clockwise Circles at 90 degrees 3 x 10  Supine Shoulder Clockwise Circles at 90 degrees 3 x 10   Serratus Punches with LUE 3 x 10    MANUAL  Upper trap trigger point release on right and left  Upper trap stretch right and left  Cervical mobilization Grade I-II   -C6-C8 CPA    09/21/21          THEREX  Shoulder Pulleys AAROM Flex/ Ext  with RUE active only and LUE PROM 2 x 10  Shoulder Pulleys AAROM Abd/ Add  with RUE active only and LUE PROM 2 x 10 Seated Rows with Black TB 3 x 10 Seated Rows with Gray TB 1 x 10   Wall Walks Abduction with LUE 1 x 10 -Increased pain to NPS 5/10  Standing Shoulder Flexion Towel Slides with LUE 3 x 10  Standing Shoulder Abduction Towel Slides with LUE 3 x 10  Shoulder ER on LUE Red band 2 x 10   MANUAL  Upper trap trigger point release on right and left  Upper trap stretch right and left                09/19/21  Shoulder Pulleys AAROM Flex/ Ext  with RUE active only and LUE PROM 2 x 10  Shoulder Pulleys AAROM Abd/ Add  with RUE active only and LUE PROM 2 x 10 Supine Shoulder Flexion PROM LUE 1 x 10  Supine Shoulder Abduction PROM LUE 1x 10  -8/10 NPS  Seated Shoulder Flexion PROM LUE 1 x 10  Wall Walks Abduction with LUE 2 x 10 Seated Shoulder Flexion Towel Slides with LUE 3 x 10    Standing Shoulder Flexion Towel Slides with LUE 3 x 10  Seated Shoulder Abduction Towel Slides with LUE 3 X 10  Standing Shoulder Abduction Towel Slides with LUE 3 x 10  Shoulder IR/ER with LUE at 0 deg abduction with #1 DB 3 x 10   09/14/21  THEREX   Shoulder Pulleys AAROM Flex/ Ext  with RUE active only and LUE PROM 1 x 15  Shoulder Pulleys AAROM Abd/ Add  with RUE active only and LUE PROM 1 x 15 Supine LUE Shoulder Flexion AAROM with RUE 2 x 10  -Pt able to perform shoulder flexion to 90 degrees  Supine LUE Shoulder ER AAROM with wand 3 x 10  Supine LUE Shoulder Abduction AAROM with wand 3 x 10    MANUAL   Left upper  trap massage             Suboccipital release             Paraspinal massage            Left Bicep Stretch      09/07/21                 MANUAL                        Trigger point release of bilateral upper traps                        Upper Trap Stretch                         Left Biceps Stretch                         Left Biceps and forearm trigger point release     THEREX  Shoulder Pulleys AAROM Flex/ Ext  with RUE active only and LUE PROM 3 x 10  Shoulder Pulleys AAROM Abd/ Add  with RUE active only and LUE PROM 3 x 10  Supine Shoulder Flexion/Extension with RUE supporting LUE 3 x 10  Supine Shoulder Abd/ Add on LUE with yard stick 3 x 10           Shoulder Flexion Isometric on LUE 5 sec hold x 10             -Pt reports NPS of 6/10           Shoulder ER Isometric on LUE 5 sec hold x 10      PATIENT EDUCATION: Education details: form and technique for appropriate exercise and explanation of plan of care.  Person educated: Patient Education method: Customer service manager Education comprehension: verbalized understanding and returned demonstration     HOME EXERCISE PROGRAM: Access Code: [email or web address] URL: [email or web address] Date: 09/26/2021 Prepared by: Bradly Chris  Exercises - Seated Gentle Upper Trapezius Stretch  - 1 x daily - 3 reps - 30 hold - Seated Shoulder Row with Anchored Resistance  - 3 x weekly - 3 sets - 10 reps - Standing Single Arm Shoulder Flexion Towel Slide at Table Top  - 1 x daily - 7 x weekly - 3 sets - 10 reps - Shoulder Abduction Towel Slide at Table Top  - 1 x daily - 7 x weekly - 3 sets - 10 reps - Seated Shoulder Flexion Self PROM  - 1 x daily - 7 x weekly - 3 sets - 10 reps - Shoulder External Rotation and Scapular Retraction with Resistance  - 1 x daily - 3 x weekly - 3 sets - 10 reps - Supine Scapular Protraction in Flexion with Dumbbells  - 1 x daily - 3 x weekly - 3 sets - 15 reps - Supine Shoulder Circles with Weight  - 1 x daily - 3 x weekly - 3 sets - 10 reps   ASSESSMENT:   CLINICAL IMPRESSION: Pt continues to be limited by pain  especially in left shoulder flexion and extension once LUE is above 90 degrees of flexion. She reports feeling like LUE is frozen like last RUE during last surgery and she will discuss this further with orthopedist at her next appointment this Thursday. She does show an improvement in left shoulder AROM with ability to  reach 90 degrees of flexion and abduction and perform exercises within this ROM. She will continue to benefit from skilled PT to restore left shoulder ROM and strength to be symmetrical to right shoulder to be able to reach up to UE activities such as lifting and reaching for objects to perform ADLs such as cleaning, cooking, or bathing.    OBJECTIVE IMPAIRMENTS decreased ROM, decreased strength, impaired UE functional use, and pain.    ACTIVITY LIMITATIONS lifting, dressing, reach over head, and hygiene/grooming   PARTICIPATION LIMITATIONS: cleaning, laundry, driving, and shopping   PERSONAL FACTORS  Diabetes mellitus  are also affecting patient's functional outcome.    REHAB POTENTIAL: Good   CLINICAL DECISION MAKING: Stable/uncomplicated   EVALUATION COMPLEXITY: Low     GOALS: Goals reviewed with patient? No   SHORT TERM GOALS: Target date: 09/14/2021  (Remove Blue Hyperlink)   Pt will be independent with HEP in order to improve strength and balance in order to decrease fall risk and improve function at home and work. Baseline: NT  Goal status: Ongoing    2.  Patient will be able to remove left arm from sling for increased mobility and as a sign of healing.  Baseline:  Currently wearing sling Goal status: Ongoing    3.  Patient will achieve full PROM of left shoulder as a sign of successful healing and to progress to week 4-10 of protocol.  Baseline: Shoulder Flex R/L 180/ PROM70, Shoulder Abd R/L 180/ PROM 80, Shoulder ER R/L 90/ PROM 30 Goal status: Ongoing        LONG TERM GOALS: Target date: 11/09/2021  (Remove Blue Hyperlink)   Patient will have improved  function and activity level as evidenced by an increase in FOTO score by 10 points or more.  Baseline: 23/100 with target of 100 Goal status: Ongoing    2.  Patient will exhibit symmetrical AROM between right and left shoulder to perform reaching, lifting and overhead tasks to carry out ADLs. Baseline: Shoulder Flex R/L 180/ PROM70, Shoulder Abd R/L 180/ PROM 80, Shoulder ER R/L 90/ PROM 30 Goal status: Ongoing    3.  Patient will exhibit symmetrical shoulder strength between right and left shoulder perform reaching, lifting and overhead tasks to carry out ADLs. Baseline: Unable to test left shoulder  Goal status: Ongoing      PLAN: PT FREQUENCY: 2x/week   PT DURATION: 10 weeks   PLANNED INTERVENTIONS: Therapeutic exercises, Neuromuscular re-education, Patient/Family education, Self Care, Joint mobilization, Joint manipulation, DME instructions, Dry Needling, Electrical stimulation, Cryotherapy, Moist heat, scar mobilization, Manual therapy, and Re-evaluation   PLAN FOR NEXT SESSION:  Measure right shoulder MMT. Progress parascapular exercises and soft tissue work of surrounding musculature of left shoulder. Measure PROM of left shoulder and continue to initiate AROM exercises       Bradly Chris PT, DPT  09/26/2021, 2:31 PM

## 2021-09-28 ENCOUNTER — Ambulatory Visit: Payer: Managed Care, Other (non HMO) | Admitting: Physical Therapy

## 2021-09-28 ENCOUNTER — Encounter: Payer: Self-pay | Admitting: Physical Therapy

## 2021-09-28 DIAGNOSIS — Z9889 Other specified postprocedural states: Secondary | ICD-10-CM

## 2021-09-28 DIAGNOSIS — M79622 Pain in left upper arm: Secondary | ICD-10-CM | POA: Diagnosis not present

## 2021-09-28 NOTE — Therapy (Signed)
OUTPATIENT PHYSICAL THERAPY TREATMENT NOTE   Patient Name: Tina Stephenson MRN: [id number] DOB:November 01, 1955, 66 y.o., female Today's Date: 09/28/2021  PCP: Delsa Grana PA-C  REFERRING PROVIDER: Dwana Melena PA-C  END OF SESSION:   PT End of Session - 09/28/21 1425     Visit Number 7    Number of Visits 20    Date for PT Re-Evaluation 11/09/21    Authorization Type [employer] 2023    Authorization - Visit Number 7    Authorization - Number of Visits 60    Progress Note Due on Visit 10    PT Start Time 8250    PT Stop Time 1500    PT Time Calculation (min) 40 min    Activity Tolerance Patient limited by pain    Behavior During Therapy [facility] for tasks assessed/performed              Past Medical History:  Diagnosis Date   Bronchitis    Depression    Diabetes mellitus without complication ([place])    Gallbladder polyp    GERD (gastroesophageal reflux disease)    Hypertension    Hypertensive disorder 03/05/2015   Liver tumor (benign)    Sacrum and coccyx fracture ([place])    Sciatica    Past Surgical History:  Procedure Laterality Date   APPENDECTOMY     NASAL SEPTUM SURGERY     WISDOM TOOTH EXTRACTION     Patient Active Problem List   Diagnosis Date Noted   Nontraumatic complete tear of left rotator cuff 08/15/2021   Nontraumatic complete tear of right rotator cuff 07/29/2021   Upper respiratory infection, acute 12/20/2020   Bruised toe 12/20/2020   Traumatic tear of supraspinatus tendon of right shoulder 09/24/2020   Tendinopathy of right biceps tendon 09/24/2020   Traumatic tear of supraspinatus tendon of left shoulder 09/24/2020   Leg swelling 08/02/2020   Dyslipidemia 01/05/2020   Cervical polyp 08/22/2018   Nocturia 08/22/2018   Intertrigo 08/22/2018   Type 2 diabetes mellitus without complication, without long-term current use of insulin ([place]) 08/22/2018   Cervical spine arthritis with nerve pain 06/25/2018   Herniated disc, cervical 06/25/2018   History of  migraine 03/25/2018   Scoliosis 03/25/2018   History of cyst of breast 03/25/2018   Sacrum and coccyx fracture, sequela 02/21/2018   Class 3 severe obesity due to excess calories without serious comorbidity with body mass index (BMI) of 45.0 to 49.9 in adult ([place]) 02/21/2018   Onychomycosis 02/21/2018   History of hypertension 02/21/2018   Vitamin D deficiency 03/19/2015   Essential hypertension 03/05/2015    REFERRING DIAG: Z98.890 (ICD-10-CM) - H/O repair of rotator cuff  THERAPY DIAG:  Pain in left upper arm  S/P left rotator cuff repair  Rationale for Evaluation and Treatment Rehabilitation  PERTINENT HISTORY: S/p left rtc repair and subacrominal decompression on 08/18/21. H/o same surgery on right shoulder several years ago with successful recovery. She has been icing repeatedly throughout the day for pain and swelling management.   PRECAUTIONS: No AROM for Left Shoulder until 4 weeks   SUBJECTIVE: Pt reports increased pain with certain exercises especially with ones that are performed at 90 degrees flexion. This pain is sharp and makes it difficult to perform exercises.   PAIN:  Are you having pain? Yes: NPRS scale: 2-3/10 Pain location: Anterior left shoulder  Pain description: aCHY Aggravating factors: Liting Relieving factors: Keeping it still    OBJECTIVE: (objective measures completed at initial evaluation unless otherwise dated)  VITALS: BP 125/67 HR 80 SpO2 100     DIAGNOSTIC FINDINGS:  CLINICAL DATA:  Shoulder pain   EXAM: RIGHT SHOULDER - 3 VIEW   COMPARISON:  None.   FINDINGS: There is no evidence of fracture or dislocation. Mild degenerative changes of the acromioclavicular joint. Bone anchors of the proximal humerus. Soft tissues are unremarkable.   IMPRESSION: No acute osseous abnormality.     Electronically Signed   By: Yetta Glassman M.D.   On: 02/15/2021 12:15     PATIENT SURVEYS:  FOTO 25/100 with target of 53     COGNITION:           Overall cognitive status: Within functional limits for tasks assessed                                  SENSATION: WFL   POSTURE: Normal posture    UPPER EXTREMITY ROM:                                                                     Cervical: Not performed during eval                               AROM                                            Flex    45                                           Ext      45                        Lat Side Bend R/L 45/45                       Rotation       R/L     60/60       Active ROM Right eval Left Eval/ All PROM  Shoulder flexion 180 70*  Shoulder extension      Shoulder abduction 180  80*  Shoulder adduction      Shoulder internal rotation 70 70  Shoulder external rotation 90 30*  Elbow flexion 150 150  Elbow extension 60 60  Wrist flexion 80 80  Wrist extension 70 70  Wrist ulnar deviation 30 30  Wrist radial deviation 20 20  Wrist pronation      Wrist supination      (Blank rows = not tested)             UPPER EXTREMITY MMT:   MMT Right eval Left eval  Shoulder flexion      Shoulder extension      Shoulder abduction      Shoulder adduction      Shoulder internal rotation      Shoulder external rotation  Middle trapezius      Lower trapezius      Elbow flexion      Elbow extension      Wrist flexion   4+  Wrist extension   4+  Wrist ulnar deviation   4+  Wrist radial deviation   4+  Wrist pronation      Wrist supination      Grip strength (lbs)   Fair   (Blank rows = not tested)       JOINT MOBILITY TESTING:  Not performed    PALPATION:  Not performed              TODAY'S TREATMENT:   09/28/21           THEREX  Shoulder Pulleys AAROM Flex/ Ext  with RUE active only and LUE PROM 3 x 10  Shoulder Pulleys AAROM Abd/ Add  with RUE active only and LUE PROM 3 x 10           OMEGA Seated Rows 3 x 10 #10           Right Side Lying Left Shoulder IR with #1 DB             -Pt unable to perform exercise through full ROM     09/26/21         THEREX  Shoulder Pulleys AAROM Flex/ Ext  with RUE active only and LUE PROM 2 x 10  Shoulder Pulleys AAROM Abd/ Add  with RUE active only and LUE PROM 2 x 10 FOTO: 28/53 Supine Shoulder Counter Clockwise Circles at 90 degrees 3 x 10  Supine Shoulder Clockwise Circles at 90 degrees 3 x 10  Serratus Punches with LUE 3 x 10    MANUAL  Upper trap trigger point release on right and left  Upper trap stretch right and left  Cervical mobilization Grade I-II   -C6-C8 CPA    09/21/21          THEREX  Shoulder Pulleys AAROM Flex/ Ext  with RUE active only and LUE PROM 2 x 10  Shoulder Pulleys AAROM Abd/ Add  with RUE active only and LUE PROM 2 x 10 Seated Rows with Black TB 3 x 10 Seated Rows with Gray TB 1 x 10   Wall Walks Abduction with LUE 1 x 10 -Increased pain to NPS 5/10  Standing Shoulder Flexion Towel Slides with LUE 3 x 10  Standing Shoulder Abduction Towel Slides with LUE 3 x 10  Shoulder ER on LUE Red band 2 x 10   MANUAL  Upper trap trigger point release on right and left  Upper trap stretch right and left                09/19/21  Shoulder Pulleys AAROM Flex/ Ext  with RUE active only and LUE PROM 2 x 10  Shoulder Pulleys AAROM Abd/ Add  with RUE active only and LUE PROM 2 x 10 Supine Shoulder Flexion PROM LUE 1 x 10  Supine Shoulder Abduction PROM LUE 1x 10  -8/10 NPS  Seated Shoulder Flexion PROM LUE 1 x 10  Wall Walks Abduction with LUE 2 x 10 Seated Shoulder Flexion Towel Slides with LUE 3 x 10    Standing Shoulder Flexion Towel Slides with LUE 3 x 10  Seated Shoulder Abduction Towel Slides with LUE 3 X 10  Standing Shoulder Abduction Towel Slides with LUE 3 x 10  Shoulder IR/ER with LUE at 0 deg  abduction with #1 DB 3 x 10    PATIENT EDUCATION: Education details: form and technique for appropriate exercise and explanation of plan of care.  Person educated: Patient Education method:  Customer service manager Education comprehension: verbalized understanding and returned demonstration     HOME EXERCISE PROGRAM: Access Code: [email or web address] URL: [email or web address] Date: 09/28/2021 Prepared by: Bradly Chris  Exercises - Seated Gentle Upper Trapezius Stretch  - 1 x daily - 3 reps - 30 hold - Seated Shoulder Row with Anchored Resistance  - 3 x weekly - 3 sets - 10 reps - Standing Single Arm Shoulder Flexion Towel Slide at Table Top  - 1 x daily - 7 x weekly - 3 sets - 10 reps - Shoulder Abduction Towel Slide at Table Top  - 1 x daily - 7 x weekly - 3 sets - 10 reps - Seated Shoulder Flexion Self PROM  - 1 x daily - 7 x weekly - 3 sets - 10 reps - Shoulder External Rotation and Scapular Retraction with Resistance  - 1 x daily - 3 x weekly - 3 sets - 10 reps - Supine Shoulder Circles with Weight  - 1 x daily - 3 x weekly - 3 sets - 10 reps - Sidelying Shoulder Flexion 15 Degrees  - 1 x daily - 3 x weekly - 3 sets - 10 reps   ASSESSMENT:   CLINICAL IMPRESSION:  Pt able to progress left shoulder AROM in gravity eliminated planes of motion and progress parascapular strength. She continues to be limited by left shoulder pain especially when flexing or abducting shoulder above 90 degrees or performing shoulder strengthening exercises against gravity. 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 Restriction in motion appears to be from muscular weakness along with muscle guarding from increased pain with movement. She will continue to benefit from skilled PT to restore left shoulder ROM and strength to be symmetrical to right shoulder to be able to reach up to UE activities such as lifting and reaching for objects to perform ADLs such as cleaning, cooking, or bathing.    OBJECTIVE IMPAIRMENTS decreased ROM,  decreased strength, impaired UE functional use, and pain.    ACTIVITY LIMITATIONS lifting, dressing, reach over head, and hygiene/grooming   PARTICIPATION LIMITATIONS: cleaning, laundry, driving, and shopping   PERSONAL FACTORS  Diabetes mellitus  are also affecting patient's functional outcome.    REHAB POTENTIAL: Good   CLINICAL DECISION MAKING: Stable/uncomplicated   EVALUATION COMPLEXITY: Low     GOALS: Goals reviewed with patient? No   SHORT TERM GOALS: Target date: 09/14/2021  (Remove Blue Hyperlink)   Pt will be independent with HEP in order to improve strength and balance in order to decrease fall risk and improve function at home and work. Baseline: NT  Goal status: Ongoing    2.  Patient will be able to remove left arm from sling for increased mobility and as a sign of healing.  Baseline:  Currently wearing sling Goal status: Ongoing    3.  Patient will achieve full PROM of left shoulder as a sign of successful healing and to progress to week 4-10 of protocol.  Baseline: Shoulder Flex R/L 180/ PROM70, Shoulder Abd R/L 180/ PROM 80, Shoulder ER R/L 90/ PROM 30 Goal status: Ongoing        LONG TERM GOALS: Target date: 11/09/2021  (  Remove Blue Hyperlink)   Patient will have improved function and activity level as evidenced by an increase in FOTO score by 10 points or more.  Baseline: 23/100 with target of 100 Goal status: Ongoing    2.  Patient will exhibit symmetrical AROM between right and left shoulder to perform reaching, lifting and overhead tasks to carry out ADLs. Baseline: Shoulder Flex R/L 180/ PROM70, Shoulder Abd R/L 180/ PROM 80, Shoulder ER R/L 90/ PROM 30 Goal status: Ongoing    3.  Patient will exhibit symmetrical shoulder strength between right and left shoulder perform reaching, lifting and overhead tasks to carry out ADLs. Baseline: Unable to test left shoulder  Goal status: Ongoing      PLAN: PT FREQUENCY: 2x/week   PT DURATION: 10 weeks    PLANNED INTERVENTIONS: Therapeutic exercises, Neuromuscular re-education, Patient/Family education, Self Care, Joint mobilization, Joint manipulation, DME instructions, Dry Needling, Electrical stimulation, Cryotherapy, Moist heat, scar mobilization, Manual therapy, and Re-evaluation   PLAN FOR NEXT SESSION:  Measure right shoulder MMT. Progress AROM of left shoulder without increased pain by >2 pts on NPS scale.     Bradly Chris PT, DPT  09/28/2021, 3:51 PM

## 2021-09-29 ENCOUNTER — Telehealth: Payer: Self-pay | Admitting: Orthopaedic Surgery

## 2021-09-29 ENCOUNTER — Ambulatory Visit (INDEPENDENT_AMBULATORY_CARE_PROVIDER_SITE_OTHER): Payer: Managed Care, Other (non HMO) | Admitting: Physician Assistant

## 2021-09-29 DIAGNOSIS — Z9889 Other specified postprocedural states: Secondary | ICD-10-CM

## 2021-09-29 MED ORDER — ACETAMINOPHEN-CODEINE 300-30 MG PO TABS: 1.0000 | ORAL_TABLET | Freq: Three times a day (TID) | ORAL | 2 refills | Status: DC | PRN

## 2021-09-29 NOTE — Progress Notes (Signed)
Post-Op Visit Note   Patient: Tina Stephenson           Date of Birth: [birth year]           MRN: [id number] Visit Date: 09/29/2021 PCP: Delsa Grana, PA-C   Assessment & Plan:  Chief Complaint:  Chief Complaint  Patient presents with   Left Shoulder - Routine Post Op   Visit Diagnoses:  1. S/P left rotator cuff repair     Plan: Patient is a pleasant 66 year old female who comes in today 6 weeks status post left shoulder arthroscopic debridement rotator cuff repair 08/18/2021.  She is having more pain in the left shoulder than she had on the right following right shoulder rotator cuff repair last fall.  She has been compliant wearing her sling.  She has been in physical therapy where she is making progress.  She notes that the oxycodone knocks her out so she has just been taking a Tylenol prior to therapy which does not seem to be helping enough with certain exercises.  Examination of her left shoulder reveals active forward flexion to about 80 degrees.  I can passively get her to approximately 100 degrees with a soft endpoint.  At this point, we have discussed discontinuing her sling.  I would like for her to continue with physical therapy and I sent in Tylenol 3 per her wishes to try and aid with progression in range of motion.  I do not feel that she has a frozen shoulder at this point.  She will follow-up with [place] in 6 weeks time for repeat evaluation.  She will call with concerns or questions in the meantime.  Follow-Up Instructions: Return in about 6 weeks (around 11/10/2021).   Orders:  No orders of the defined types were placed in this encounter.  No orders of the defined types were placed in this encounter.   Imaging: No new imaging  PMFS History: Patient Active Problem List   Diagnosis Date Noted   Nontraumatic complete tear of left rotator cuff 08/15/2021   Nontraumatic complete tear of right rotator cuff 07/29/2021   Upper respiratory infection, acute 12/20/2020    Bruised toe 12/20/2020   Traumatic tear of supraspinatus tendon of right shoulder 09/24/2020   Tendinopathy of right biceps tendon 09/24/2020   Traumatic tear of supraspinatus tendon of left shoulder 09/24/2020   Leg swelling 08/02/2020   Dyslipidemia 01/05/2020   Cervical polyp 08/22/2018   Nocturia 08/22/2018   Intertrigo 08/22/2018   Type 2 diabetes mellitus without complication, without long-term current use of insulin ([place]) 08/22/2018   Cervical spine arthritis with nerve pain 06/25/2018   Herniated disc, cervical 06/25/2018   History of migraine 03/25/2018   Scoliosis 03/25/2018   History of cyst of breast 03/25/2018   Sacrum and coccyx fracture, sequela 02/21/2018   Class 3 severe obesity due to excess calories without serious comorbidity with body mass index (BMI) of 45.0 to 49.9 in adult ([place]) 02/21/2018   Onychomycosis 02/21/2018   History of hypertension 02/21/2018   Vitamin D deficiency 03/19/2015   Essential hypertension 03/05/2015   Past Medical History:  Diagnosis Date   Bronchitis    Depression    Diabetes mellitus without complication ([facility])    Gallbladder polyp    GERD (gastroesophageal reflux disease)    Hypertension    Hypertensive disorder 03/05/2015   Liver tumor (benign)    Sacrum and coccyx fracture ([place])    Sciatica     Family History  Problem Relation Age  of Onset   COPD Mother    Cancer Mother    Heart disease Father    Breast cancer Paternal Aunt    Diabetes Brother    Diabetes Maternal Grandfather    Ovarian cancer Neg Hx    Colon cancer Neg Hx     Past Surgical History:  Procedure Laterality Date   APPENDECTOMY     NASAL SEPTUM SURGERY     WISDOM TOOTH EXTRACTION     Social History   Occupational History   Not on file  Tobacco Use   Smoking status: Never   Smokeless tobacco: Never  Vaping Use   Vaping Use: Never used  Substance and Sexual Activity   Alcohol use: Never   Drug use: Never   Sexual activity: Not Currently     Birth control/protection: None

## 2021-09-29 NOTE — Telephone Encounter (Signed)
Patient had disability paperwork faxed over yesterday 09/28/21 she came in and made a payment of $25 in cash for [employer]

## 2021-10-03 ENCOUNTER — Ambulatory Visit: Payer: Managed Care, Other (non HMO)

## 2021-10-03 DIAGNOSIS — M6281 Muscle weakness (generalized): Secondary | ICD-10-CM

## 2021-10-03 DIAGNOSIS — Z9889 Other specified postprocedural states: Secondary | ICD-10-CM

## 2021-10-03 DIAGNOSIS — R262 Difficulty in walking, not elsewhere classified: Secondary | ICD-10-CM

## 2021-10-03 DIAGNOSIS — M79622 Pain in left upper arm: Secondary | ICD-10-CM | POA: Diagnosis not present

## 2021-10-03 NOTE — Therapy (Signed)
OUTPATIENT PHYSICAL THERAPY TREATMENT NOTE   Patient Name: Tina Stephenson MRN: [id number] DOB:10-19-1955, 66 y.o., female Today's Date: 10/03/2021  PCP: Delsa Grana PA-C  REFERRING PROVIDER: Dwana Melena PA-C  END OF SESSION:   PT End of Session - 10/03/21 1419     Visit Number 8    Number of Visits 20    Date for PT Re-Evaluation 11/09/21    Authorization Type [employer] [id number]    Authorization - Visit Number 7    Authorization - Number of Visits 60    Progress Note Due on Visit 10    PT Start Time 1418    PT Stop Time 1500    PT Time Calculation (min) 42 min    Activity Tolerance Patient limited by pain    Behavior During Therapy [facility] for tasks assessed/performed              Past Medical History:  Diagnosis Date   Bronchitis    Depression    Diabetes mellitus without complication ([place])    Gallbladder polyp    GERD (gastroesophageal reflux disease)    Hypertension    Hypertensive disorder 03/05/2015   Liver tumor (benign)    Sacrum and coccyx fracture ([place])    Sciatica    Past Surgical History:  Procedure Laterality Date   APPENDECTOMY     NASAL SEPTUM SURGERY     WISDOM TOOTH EXTRACTION     Patient Active Problem List   Diagnosis Date Noted   Nontraumatic complete tear of left rotator cuff 08/15/2021   Nontraumatic complete tear of right rotator cuff 07/29/2021   Upper respiratory infection, acute 12/20/2020   Bruised toe 12/20/2020   Traumatic tear of supraspinatus tendon of right shoulder 09/24/2020   Tendinopathy of right biceps tendon 09/24/2020   Traumatic tear of supraspinatus tendon of left shoulder 09/24/2020   Leg swelling 08/02/2020   Dyslipidemia 01/05/2020   Cervical polyp 08/22/2018   Nocturia 08/22/2018   Intertrigo 08/22/2018   Type 2 diabetes mellitus without complication, without long-term current use of insulin ([place]) 08/22/2018   Cervical spine arthritis with nerve pain 06/25/2018   Herniated disc, cervical 06/25/2018   History of  migraine 03/25/2018   Scoliosis 03/25/2018   History of cyst of breast 03/25/2018   Sacrum and coccyx fracture, sequela 02/21/2018   Class 3 severe obesity due to excess calories without serious comorbidity with body mass index (BMI) of 45.0 to 49.9 in adult ([place]) 02/21/2018   Onychomycosis 02/21/2018   History of hypertension 02/21/2018   Vitamin D deficiency 03/19/2015   Essential hypertension 03/05/2015    REFERRING DIAG: Z98.890 (ICD-10-CM) - H/O repair of rotator cuff  THERAPY DIAG:  Difficulty in walking, not elsewhere classified  Muscle weakness (generalized)  Pain in left upper arm  S/P left rotator cuff repair  Rationale for Evaluation and Treatment Rehabilitation  PERTINENT HISTORY: S/p left rtc repair and subacrominal decompression on 08/18/21. H/o same surgery on right shoulder several years ago with successful recovery. She has been icing repeatedly throughout the day for pain and swelling management.   PRECAUTIONS: No AROM for Left Shoulder until 4 weeks   SUBJECTIVE:  Pt notes that she was reading on her Kindle this morning and it was about to fall off the table, and out of reflex, she reached to grab it and hurt her shoulder.  She was reporting 10/10 pain with that movement, but has since resolved back down to 2/10.    PAIN:  2/10 with pain medicine  prior to arrival    OBJECTIVE: (objective measures completed at initial evaluation unless otherwise dated)  VITALS: BP 125/67 HR 80 SpO2 100     DIAGNOSTIC FINDINGS:  CLINICAL DATA:  Shoulder pain   EXAM: RIGHT SHOULDER - 3 VIEW   COMPARISON:  None.   FINDINGS: There is no evidence of fracture or dislocation. Mild degenerative changes of the acromioclavicular joint. Bone anchors of the proximal humerus. Soft tissues are unremarkable.   IMPRESSION: No acute osseous abnormality.     Electronically Signed   By: Yetta Glassman M.D.   On: 02/15/2021 12:15     PATIENT SURVEYS:  FOTO 25/100 with  target of 53    COGNITION: Overall cognitive status: Within functional limits for tasks assessed                                  SENSATION: WFL   POSTURE: Normal posture    UPPER EXTREMITY ROM:      Cervical: Not performed during eval AROM  Flex    45 Ext      45  Lat Side Bend R/L 45/45 Rotation       R/L     60/60       Active ROM Right eval Left Eval/ All PROM  Shoulder flexion 180 70*  Shoulder extension      Shoulder abduction 180  80*  Shoulder adduction      Shoulder internal rotation 70 70  Shoulder external rotation 90 30*  Elbow flexion 150 150  Elbow extension 60 60  Wrist flexion 80 80  Wrist extension 70 70  Wrist ulnar deviation 30 30  Wrist radial deviation 20 20  Wrist pronation      Wrist supination      (Blank rows = not tested)             UPPER EXTREMITY MMT:   MMT Right eval Left eval  Shoulder flexion      Shoulder extension      Shoulder abduction      Shoulder adduction      Shoulder internal rotation      Shoulder external rotation      Middle trapezius      Lower trapezius      Elbow flexion      Elbow extension      Wrist flexion   4+  Wrist extension   4+  Wrist ulnar deviation   4+  Wrist radial deviation   4+  Wrist pronation      Wrist supination      Grip strength (lbs)   Fair   (Blank rows = not tested)        JOINT MOBILITY TESTING:  Not performed    PALPATION:  Not performed              TODAY'S TREATMENT:   10/03/21  TherEx: Shoulder Pulleys AAROM Flex/ Ext  with RUE active only and LUE PROM 3 x 10  Shoulder Pulleys AAROM Abd/ Add  with RUE active only and LUE PROM 3 x 10  Standing shoulder isometrics into physioball in flexion/abduction/adduction/extension for increased strength without pain x10 each direction    MMT Right 10/03/21  Shoulder flexion 5   Shoulder abduction 4+  Shoulder adduction 4+  Shoulder internal rotation 4   Shoulder external rotation 4+   Unable to perform MMT  of the L side due  to inability to perform full ROM against gravity    Manual:  Supine STM to cervical region to increase extensibility of the paraspinals Supine UT/Levator stretch, 30 sec bouts to increase tissue extensibility of the cervical region Supine STM with TP release technique applied to UT/Levator to decreased tissue restrictions preventing full ROM    Trigger Point Dry Needling (TDN), unbilled Education performed with patient regarding potential benefit of TDN. Reviewed precautions and risks with patient. Reviewed special precautions/risks over lung fields which include pneumothorax. Reviewed signs and symptoms of pneumothorax and advised pt to go to ER immediately if these symptoms develop advise them of dry needling treatment. Extensive time spent with pt to ensure full understanding of TDN risks. Pt provided verbal consent to treatment. TDN performed to B Upper Trapezius with 0.3 x 30 single needle placements with local twitch response (LTR). Pistoning technique utilized. Improved pain-free motion following intervention.       PATIENT EDUCATION: Education details: form and technique for appropriate exercise and explanation of plan of care.  Person educated: Patient Education method: Customer service manager Education comprehension: verbalized understanding and returned demonstration     HOME EXERCISE PROGRAM: Access Code: [email or web address] URL: [email or web address] Date: 10/03/2021 Prepared by: Bradly Chris Updated by: [place]  Exercises - Seated Gentle Upper Trapezius Stretch  - 1 x daily - 3 reps - 30 hold - Seated Shoulder Row with Anchored Resistance  - 3 x weekly - 3 sets - 10 reps - Standing Single Arm Shoulder Flexion Towel Slide at Table Top  - 1 x daily - 7 x weekly - 3 sets - 10 reps - Shoulder Abduction Towel Slide at Table Top  - 1 x daily - 7 x weekly - 3 sets - 10 reps - Seated Shoulder Flexion Self PROM  - 1 x daily - 7 x weekly - 3  sets - 10 reps - Shoulder External Rotation and Scapular Retraction with Resistance  - 1 x daily - 3 x weekly - 3 sets - 10 reps - Supine Shoulder Circles with Weight  - 1 x daily - 3 x weekly - 3 sets - 10 reps - Sidelying Shoulder Flexion 15 Degrees  - 1 x daily - 3 x weekly - 3 sets - 10 reps - Isometric Shoulder Extension with Ball at Wall  - 1 x daily - 7 x weekly - 3 sets - 10 reps - Isometric Shoulder Flexion with Ball at [employer]  - 1 x daily - 7 x weekly - 3 sets - 10 reps - Isometric Shoulder Abduction with Ball at [employer]  - 1 x daily - 7 x weekly - 3 sets - 10 reps - Isometric Shoulder Adduction  - 1 x daily - 7 x weekly - 3 sets - 10 reps   ASSESSMENT:   CLINICAL IMPRESSION:  Pt put forth good effort with exercises and manual therapy approaches.  Pt with palpable TP along the L UT that is causing restriction of the neck and is noticeable to pt at home when doing ADL's.  Pt agreeable to dry-needling approach and TP was resolved with use of technique.  Pt encouraged to drink water when driving and was monitored for parasympathetic system response prior to leaving facility.  Pt does still not have muscular ability to perform full ROM of the L UE and was given isometric exercises as part of updated HEP with use of physioball.  Pt tolerated exercises without any increase in pain during session.   Pt will  continue to benefit from skilled therapy to address remaining deficits in order to improve overall QoL and return to PLOF.      OBJECTIVE IMPAIRMENTS decreased ROM, decreased strength, impaired UE functional use, and pain.    ACTIVITY LIMITATIONS lifting, dressing, reach over head, and hygiene/grooming   PARTICIPATION LIMITATIONS: cleaning, laundry, driving, and shopping   PERSONAL FACTORS  Diabetes mellitus  are also affecting patient's functional outcome.    REHAB POTENTIAL: Good   CLINICAL DECISION MAKING: Stable/uncomplicated   EVALUATION COMPLEXITY: Low     GOALS: Goals  reviewed with patient? No   SHORT TERM GOALS: Target date: 09/14/2021  (Remove Blue Hyperlink)   Pt will be independent with HEP in order to improve strength and balance in order to decrease fall risk and improve function at home and work. Baseline: NT  Goal status: Ongoing    2.  Patient will be able to remove left arm from sling for increased mobility and as a sign of healing.  Baseline:  Currently wearing sling Goal status: Ongoing    3.  Patient will achieve full PROM of left shoulder as a sign of successful healing and to progress to week 4-10 of protocol.  Baseline: Shoulder Flex R/L 180/ PROM70, Shoulder Abd R/L 180/ PROM 80, Shoulder ER R/L 90/ PROM 30 Goal status: Ongoing        LONG TERM GOALS: Target date: 11/09/2021  (Remove Blue Hyperlink)   Patient will have improved function and activity level as evidenced by an increase in FOTO score by 10 points or more.  Baseline: 23/100 with target of 100 Goal status: Ongoing    2.  Patient will exhibit symmetrical AROM between right and left shoulder to perform reaching, lifting and overhead tasks to carry out ADLs. Baseline: Shoulder Flex R/L 180/ PROM70, Shoulder Abd R/L 180/ PROM 80, Shoulder ER R/L 90/ PROM 30 Goal status: Ongoing    3.  Patient will exhibit symmetrical shoulder strength between right and left shoulder perform reaching, lifting and overhead tasks to carry out ADLs. Baseline: Unable to test left shoulder  Goal status: Ongoing      PLAN: PT FREQUENCY: 2x/week   PT DURATION: 10 weeks   PLANNED INTERVENTIONS: Therapeutic exercises, Neuromuscular re-education, Patient/Family education, Self Care, Joint mobilization, Joint manipulation, DME instructions, Dry Needling, Electrical stimulation, Cryotherapy, Moist heat, scar mobilization, Manual therapy, and Re-evaluation   PLAN FOR NEXT SESSION:  Assess isometric exercises with physioball of the L UE.     Gwenlyn Saran, PT, DPT 10/03/21, 3:44 PM

## 2021-10-05 ENCOUNTER — Encounter: Payer: Self-pay | Admitting: Physical Therapy

## 2021-10-05 ENCOUNTER — Ambulatory Visit: Payer: Managed Care, Other (non HMO) | Admitting: Physical Therapy

## 2021-10-05 DIAGNOSIS — M79622 Pain in left upper arm: Secondary | ICD-10-CM

## 2021-10-05 DIAGNOSIS — Z9889 Other specified postprocedural states: Secondary | ICD-10-CM

## 2021-10-05 NOTE — Therapy (Signed)
OUTPATIENT PHYSICAL THERAPY TREATMENT NOTE   Patient Name: Tina Stephenson MRN: [id number] DOB:November 08, 1955, 66 y.o., female Today's Date: 10/05/2021  PCP: Delsa Grana PA-C  REFERRING PROVIDER: Dwana Melena PA-C  END OF SESSION:   PT End of Session - 10/05/21 1425     Visit Number 9    Number of Visits 20    Date for PT Re-Evaluation 11/09/21    Authorization Type [employer] 2023    Authorization - Visit Number 9    Authorization - Number of Visits 60    Progress Note Due on Visit 10    PT Start Time 0354    PT Stop Time 1500    PT Time Calculation (min) 45 min    Activity Tolerance Patient limited by pain    Behavior During Therapy [facility] for tasks assessed/performed              Past Medical History:  Diagnosis Date   Bronchitis    Depression    Diabetes mellitus without complication ([place])    Gallbladder polyp    GERD (gastroesophageal reflux disease)    Hypertension    Hypertensive disorder 03/05/2015   Liver tumor (benign)    Sacrum and coccyx fracture ([place])    Sciatica    Past Surgical History:  Procedure Laterality Date   APPENDECTOMY     NASAL SEPTUM SURGERY     WISDOM TOOTH EXTRACTION     Patient Active Problem List   Diagnosis Date Noted   Nontraumatic complete tear of left rotator cuff 08/15/2021   Nontraumatic complete tear of right rotator cuff 07/29/2021   Upper respiratory infection, acute 12/20/2020   Bruised toe 12/20/2020   Traumatic tear of supraspinatus tendon of right shoulder 09/24/2020   Tendinopathy of right biceps tendon 09/24/2020   Traumatic tear of supraspinatus tendon of left shoulder 09/24/2020   Leg swelling 08/02/2020   Dyslipidemia 01/05/2020   Cervical polyp 08/22/2018   Nocturia 08/22/2018   Intertrigo 08/22/2018   Type 2 diabetes mellitus without complication, without long-term current use of insulin ([place]) 08/22/2018   Cervical spine arthritis with nerve pain 06/25/2018   Herniated disc, cervical 06/25/2018   History of  migraine 03/25/2018   Scoliosis 03/25/2018   History of cyst of breast 03/25/2018   Sacrum and coccyx fracture, sequela 02/21/2018   Class 3 severe obesity due to excess calories without serious comorbidity with body mass index (BMI) of 45.0 to 49.9 in adult ([place]) 02/21/2018   Onychomycosis 02/21/2018   History of hypertension 02/21/2018   Vitamin D deficiency 03/19/2015   Essential hypertension 03/05/2015    REFERRING DIAG: Z98.890 (ICD-10-CM) - H/O repair of rotator cuff  THERAPY DIAG:  S/P left rotator cuff repair  Pain in left upper arm  Rationale for Evaluation and Treatment Rehabilitation  PERTINENT HISTORY: S/p left rtc repair and subacrominal decompression on 08/18/21. H/o same surgery on right shoulder several years ago with successful recovery. She has been icing repeatedly throughout the day for pain and swelling management.   PRECAUTIONS: No AROM for Left Shoulder until 4 weeks   SUBJECTIVE:  Pt reports excellent response to dry needling from last session with decreased muscular tension in upper left traps. She has been able to do exercises without a significant increase in her left shoulder pain.     PAIN:  2/10 with pain medicine prior to arrival    OBJECTIVE: (objective measures completed at initial evaluation unless otherwise dated)  VITALS: BP 125/67 HR 80 SpO2 100  DIAGNOSTIC FINDINGS:  CLINICAL DATA:  Shoulder pain   EXAM: RIGHT SHOULDER - 3 VIEW   COMPARISON:  None.   FINDINGS: There is no evidence of fracture or dislocation. Mild degenerative changes of the acromioclavicular joint. Bone anchors of the proximal humerus. Soft tissues are unremarkable.   IMPRESSION: No acute osseous abnormality.     Electronically Signed   By: Yetta Glassman M.D.   On: 02/15/2021 12:15     PATIENT SURVEYS:  FOTO 25/100 with target of 53    COGNITION: Overall cognitive status: Within functional limits for tasks assessed                                   SENSATION: WFL   POSTURE: Normal posture    UPPER EXTREMITY ROM:      Cervical: Not performed during eval AROM  Flex    45 Ext      45  Lat Side Bend R/L 45/45 Rotation       R/L     60/60       Active ROM Right eval Left Eval/ All PROM  Shoulder flexion 180 70*  Shoulder extension      Shoulder abduction 180  80*  Shoulder adduction      Shoulder internal rotation 70 70  Shoulder external rotation 90 30*  Elbow flexion 150 150  Elbow extension 60 60  Wrist flexion 80 80  Wrist extension 70 70  Wrist ulnar deviation 30 30  Wrist radial deviation 20 20  Wrist pronation      Wrist supination      (Blank rows = not tested)             UPPER EXTREMITY MMT:   MMT Left  10/05/21  Right eval  Shoulder flexion 3-     Shoulder extension      Shoulder abduction  3+    Shoulder adduction      Shoulder internal rotation  3    Shoulder external rotation 3+     Middle trapezius      Lower trapezius      Elbow flexion      Elbow extension      Wrist flexion   4+  Wrist extension   4+  Wrist ulnar deviation   4+  Wrist radial deviation   4+  Wrist pronation      Wrist supination      Grip strength (lbs)   Fair   (Blank rows = not tested)        JOINT MOBILITY TESTING:  Not performed    PALPATION:  Not performed              TODAY'S TREATMENT:   10/05/21   THEREX   MMT Left  10/05/21  Shoulder flexion 3-   Shoulder abduction 3+  Shoulder internal rotation 3  Shoulder external rotation 3+     Left Side Lying Shoulder Flexion and Extension 1 x 10 with #1 DB  Left Shoulder AROM Flexion/Extension Towel Slides with elevated table 2 x 10  Left Shoulder AROM Flexion/Extension Red Physioball 3 x 10  Left Shoulder AROM Abduction/Adduction Red Physioball 1 x 10  Left Shoulder AROM Abduction/Adduction with towel slides 1 x 10  Shoulder IR/ER with red band at 0 degrees abduction 1 x 10   MANUAL THERAPY   Upper trap trigger point release on  left upper trap  Left upper trap stretch    10/03/21  TherEx: Shoulder Pulleys AAROM Flex/ Ext  with RUE active only and LUE PROM 3 x 10  Shoulder Pulleys AAROM Abd/ Add  with RUE active only and LUE PROM 3 x 10  Standing shoulder isometrics into physioball in flexion/abduction/adduction/extension for increased strength without pain x10 each direction    MMT Right 10/03/21  Shoulder flexion 5   Shoulder abduction 4+  Shoulder adduction 4+  Shoulder internal rotation 4   Shoulder external rotation 4+   Unable to perform MMT of the L side due to inability to perform full ROM against gravity    Manual:  Supine STM to cervical region to increase extensibility of the paraspinals Supine UT/Levator stretch, 30 sec bouts to increase tissue extensibility of the cervical region Supine STM with TP release technique applied to UT/Levator to decreased tissue restrictions preventing full ROM    Trigger Point Dry Needling (TDN), unbilled Education performed with patient regarding potential benefit of TDN. Reviewed precautions and risks with patient. Reviewed special precautions/risks over lung fields which include pneumothorax. Reviewed signs and symptoms of pneumothorax and advised pt to go to ER immediately if these symptoms develop advise them of dry needling treatment. Extensive time spent with pt to ensure full understanding of TDN risks. Pt provided verbal consent to treatment. TDN performed to B Upper Trapezius with 0.3 x 30 single needle placements with local twitch response (LTR). Pistoning technique utilized. Improved pain-free motion following intervention.       PATIENT EDUCATION: Education details: form and technique for appropriate exercise and explanation of plan of care.  Person educated: Patient Education method: Customer service manager Education comprehension: verbalized understanding and returned demonstration     HOME EXERCISE PROGRAM: Access Code:  [email or web address] URL: [email or web address] Date: 10/03/2021 Prepared by: Bradly Chris Updated by: [place]  Exercises - Seated Gentle Upper Trapezius Stretch  - 1 x daily - 3 reps - 30 hold - Seated Shoulder Row with Anchored Resistance  - 3 x weekly - 3 sets - 10 reps - Standing Single Arm Shoulder Flexion Towel Slide at Table Top  - 1 x daily - 7 x weekly - 3 sets - 10 reps - Shoulder Abduction Towel Slide at Table Top  - 1 x daily - 7 x weekly - 3 sets - 10 reps - Seated Shoulder Flexion Self PROM  - 1 x daily - 7 x weekly - 3 sets - 10 reps - Shoulder External Rotation and Scapular Retraction with Resistance  - 1 x daily - 3 x weekly - 3 sets - 10 reps - Supine Shoulder Circles with Weight  - 1 x daily - 3 x weekly - 3 sets - 10 reps - Sidelying Shoulder Flexion 15 Degrees  - 1 x daily - 3 x weekly - 3 sets - 10 reps - Isometric Shoulder Extension with Ball at Wall  - 1 x daily - 7 x weekly - 3 sets - 10 reps - Isometric Shoulder Flexion with Ball at [employer]  - 1 x daily - 7 x weekly - 3 sets - 10 reps - Isometric Shoulder Abduction with Ball at [employer]  - 1 x daily - 7 x weekly - 3 sets - 10 reps - Isometric Shoulder Adduction  - 1 x daily - 7 x weekly - 3 sets - 10 reps   ASSESSMENT:   CLINICAL IMPRESSION:  Pt continues exhibit decreased left shoulder ROM and strength due to increased pain. She is  especially limited in open chain abduction/adduction and flexion/extension exercises. She also continues to show increased left upper trap muscle extensibility. She will benefit from modification of HEP next session to include more closed chain exercises. She will continue to benefit from skilled therapy to address remaining deficits in order to improve overall QoL and return to PLOF.     OBJECTIVE IMPAIRMENTS decreased ROM, decreased strength, impaired UE functional use, and pain.    ACTIVITY LIMITATIONS lifting, dressing, reach over head, and hygiene/grooming   PARTICIPATION  LIMITATIONS: cleaning, laundry, driving, and shopping   PERSONAL FACTORS  Diabetes mellitus  are also affecting patient's functional outcome.    REHAB POTENTIAL: Good   CLINICAL DECISION MAKING: Stable/uncomplicated   EVALUATION COMPLEXITY: Low     GOALS: Goals reviewed with patient? No   SHORT TERM GOALS: Target date: 09/14/2021  (Remove Blue Hyperlink)   Pt will be independent with HEP in order to improve strength and balance in order to decrease fall risk and improve function at home and work. Baseline: NT  Goal status: Ongoing    2.  Patient will be able to remove left arm from sling for increased mobility and as a sign of healing.  Baseline:  Currently wearing sling Goal status: Ongoing    3.  Patient will achieve full PROM of left shoulder as a sign of successful healing and to progress to week 4-10 of protocol.  Baseline: Shoulder Flex R/L 180/ PROM70, Shoulder Abd R/L 180/ PROM 80, Shoulder ER R/L 90/ PROM 30 Goal status: Ongoing        LONG TERM GOALS: Target date: 11/09/2021  (Remove Blue Hyperlink)   Patient will have improved function and activity level as evidenced by an increase in FOTO score by 10 points or more.  Baseline: 23/100 with target of 100 Goal status: Ongoing    2.  Patient will exhibit symmetrical AROM between right and left shoulder to perform reaching, lifting and overhead tasks to carry out ADLs. Baseline: Shoulder Flex R/L 180/ PROM70, Shoulder Abd R/L 180/ PROM 80, Shoulder ER R/L 90/ PROM 30 Goal status: Ongoing    3.  Patient will exhibit symmetrical shoulder strength between right and left shoulder perform reaching, lifting and overhead tasks to carry out ADLs. Baseline: Unable to test left shoulder  Goal status: Ongoing      PLAN: PT FREQUENCY: 2x/week   PT DURATION: 10 weeks   PLANNED INTERVENTIONS: Therapeutic exercises, Neuromuscular re-education, Patient/Family education, Self Care, Joint mobilization, Joint manipulation, DME  instructions, Dry Needling, Electrical stimulation, Cryotherapy, Moist heat, scar mobilization, Manual therapy, and Re-evaluation   PLAN FOR NEXT SESSION:  Reassess goals. Progress AROM exercises for LUE and modify with closed chain exercises    Bradly Chris PT, DPT 10/05/21, 2:26 PM

## 2021-10-10 ENCOUNTER — Ambulatory Visit: Payer: Managed Care, Other (non HMO) | Attending: Physician Assistant | Admitting: Physical Therapy

## 2021-10-10 ENCOUNTER — Encounter: Payer: Self-pay | Admitting: Physical Therapy

## 2021-10-10 DIAGNOSIS — M79622 Pain in left upper arm: Secondary | ICD-10-CM | POA: Insufficient documentation

## 2021-10-10 DIAGNOSIS — Z9889 Other specified postprocedural states: Secondary | ICD-10-CM | POA: Insufficient documentation

## 2021-10-10 NOTE — Therapy (Signed)
OUTPATIENT PHYSICAL THERAPY PROGRESS NOTE   Patient Name: Tina Stephenson MRN: [id number] DOB:Apr 05, 1955, 66 y.o., female Today's Date: 10/10/2021  PCP: Delsa Grana PA-C  REFERRING PROVIDER: Dwana Melena PA-C  END OF SESSION:   PT End of Session - 10/10/21 1418     Visit Number 10    Number of Visits 20    Date for PT Re-Evaluation 11/09/21    Authorization Type [employer] 2023    Authorization - Visit Number 10    Authorization - Number of Visits 60    Progress Note Due on Visit 10    PT Start Time 1415    PT Stop Time 1500    PT Time Calculation (min) 45 min    Activity Tolerance Patient limited by pain    Behavior During Therapy [facility] for tasks assessed/performed              Past Medical History:  Diagnosis Date   Bronchitis    Depression    Diabetes mellitus without complication ([place])    Gallbladder polyp    GERD (gastroesophageal reflux disease)    Hypertension    Hypertensive disorder 03/05/2015   Liver tumor (benign)    Sacrum and coccyx fracture ([place])    Sciatica    Past Surgical History:  Procedure Laterality Date   APPENDECTOMY     NASAL SEPTUM SURGERY     WISDOM TOOTH EXTRACTION     Patient Active Problem List   Diagnosis Date Noted   Nontraumatic complete tear of left rotator cuff 08/15/2021   Nontraumatic complete tear of right rotator cuff 07/29/2021   Upper respiratory infection, acute 12/20/2020   Bruised toe 12/20/2020   Traumatic tear of supraspinatus tendon of right shoulder 09/24/2020   Tendinopathy of right biceps tendon 09/24/2020   Traumatic tear of supraspinatus tendon of left shoulder 09/24/2020   Leg swelling 08/02/2020   Dyslipidemia 01/05/2020   Cervical polyp 08/22/2018   Nocturia 08/22/2018   Intertrigo 08/22/2018   Type 2 diabetes mellitus without complication, without long-term current use of insulin ([place]) 08/22/2018   Cervical spine arthritis with nerve pain 06/25/2018   Herniated disc, cervical 06/25/2018   History of  migraine 03/25/2018   Scoliosis 03/25/2018   History of cyst of breast 03/25/2018   Sacrum and coccyx fracture, sequela 02/21/2018   Class 3 severe obesity due to excess calories without serious comorbidity with body mass index (BMI) of 45.0 to 49.9 in adult ([place]) 02/21/2018   Onychomycosis 02/21/2018   History of hypertension 02/21/2018   Vitamin D deficiency 03/19/2015   Essential hypertension 03/05/2015    REFERRING DIAG: Z98.890 (ICD-10-CM) - H/O repair of rotator cuff  THERAPY DIAG:  S/P left rotator cuff repair  Pain in left upper arm  Rationale for Evaluation and Treatment Rehabilitation  PERTINENT HISTORY: S/p left rtc repair and subacrominal decompression on 08/18/21. H/o same surgery on right shoulder several years ago with successful recovery. She has been icing repeatedly throughout the day for pain and swelling management.   PRECAUTIONS: No AROM for Left Shoulder until 4 weeks   SUBJECTIVE:  Pt reports increased pain from the morning that has not resolved and she is unsure what is causing it. Exercises have been going well with the exception of AROM shoulder abduction rolls.     PAIN:  4-5/10 with pain medicine prior to arrival    OBJECTIVE: (objective measures completed at initial evaluation unless otherwise dated)  VITALS: BP 125/67 HR 80 SpO2 100  DIAGNOSTIC FINDINGS:  CLINICAL DATA:  Shoulder pain   EXAM: RIGHT SHOULDER - 3 VIEW   COMPARISON:  None.   FINDINGS: There is no evidence of fracture or dislocation. Mild degenerative changes of the acromioclavicular joint. Bone anchors of the proximal humerus. Soft tissues are unremarkable.   IMPRESSION: No acute osseous abnormality.     Electronically Signed   By: Yetta Glassman M.D.   On: 02/15/2021 12:15     PATIENT SURVEYS:  FOTO 25/100 with target of 53    COGNITION: Overall cognitive status: Within functional limits for tasks assessed                                   SENSATION: WFL   POSTURE: Normal posture    UPPER EXTREMITY ROM:      Cervical: Not performed during eval AROM  Flex    45 Ext      45  Lat Side Bend R/L 45/45 Rotation       R/L     60/60       Active ROM Right eval Left Eval/ All PROM  Shoulder flexion 180 70*  Shoulder extension      Shoulder abduction 180  80*  Shoulder adduction      Shoulder internal rotation 70 70  Shoulder external rotation 90 30*  Elbow flexion 150 150  Elbow extension 60 60  Wrist flexion 80 80  Wrist extension 70 70  Wrist ulnar deviation 30 30  Wrist radial deviation 20 20  Wrist pronation      Wrist supination      (Blank rows = not tested)             UPPER EXTREMITY MMT:   MMT Left  10/05/21  Right eval  Shoulder flexion 3-     Shoulder extension      Shoulder abduction  3+    Shoulder adduction      Shoulder internal rotation  3    Shoulder external rotation 3+     Middle trapezius      Lower trapezius      Elbow flexion      Elbow extension      Wrist flexion   4+  Wrist extension   4+  Wrist ulnar deviation   4+  Wrist radial deviation   4+  Wrist pronation      Wrist supination      Grip strength (lbs)   Fair   (Blank rows = not tested)        JOINT MOBILITY TESTING:  Not performed    PALPATION:  Not performed              TODAY'S TREATMENT:   10/10/21  MANUAL  Left upper trap and rhomboid trigger point release   THEREX   Shoulder Pulleys AAROM Flex/ Ext  with RUE active only and LUE PROM 3 x 10  Shoulder Pulleys AAROM Abd/ Add  with RUE active only and LUE PROM 3 x 10   See Shoulder ROM under goals  Shoulder Blade Depression Push Ups from Chair 2 x 10   Standing weight shifts in standing 2 x 10  Supine Shoulder Abduction on LLE 2 x 10 -Pt provided inferior glides grade I and II for pain modulation   Seated Shoulder Blade Depression 1 x 10  -Pt unable to complete because of increased pain    10/05/21  THEREX   MMT Left   10/05/21  Shoulder flexion 3-   Shoulder abduction 3+  Shoulder internal rotation 3  Shoulder external rotation 3+     Left Side Lying Shoulder Flexion and Extension 1 x 10 with #1 DB  Left Shoulder AROM Flexion/Extension Towel Slides with elevated table 2 x 10  Left Shoulder AROM Flexion/Extension Red Physioball 3 x 10  Left Shoulder AROM Abduction/Adduction Red Physioball 1 x 10  Left Shoulder AROM Abduction/Adduction with towel slides 1 x 10  Shoulder IR/ER with red band at 0 degrees abduction 1 x 10   MANUAL THERAPY   Upper trap trigger point release on left upper trap  Left upper trap stretch    10/03/21  TherEx: Shoulder Pulleys AAROM Flex/ Ext  with RUE active only and LUE PROM 3 x 10  Shoulder Pulleys AAROM Abd/ Add  with RUE active only and LUE PROM 3 x 10  Standing shoulder isometrics into physioball in flexion/abduction/adduction/extension for increased strength without pain x10 each direction    MMT Right 10/03/21  Shoulder flexion 5   Shoulder abduction 4+  Shoulder adduction 4+  Shoulder internal rotation 4   Shoulder external rotation 4+   Unable to perform MMT of the L side due to inability to perform full ROM against gravity    Manual:  Supine STM to cervical region to increase extensibility of the paraspinals Supine UT/Levator stretch, 30 sec bouts to increase tissue extensibility of the cervical region Supine STM with TP release technique applied to UT/Levator to decreased tissue restrictions preventing full ROM    Trigger Point Dry Needling (TDN), unbilled Education performed with patient regarding potential benefit of TDN. Reviewed precautions and risks with patient. Reviewed special precautions/risks over lung fields which include pneumothorax. Reviewed signs and symptoms of pneumothorax and advised pt to go to ER immediately if these symptoms develop advise them of dry needling treatment. Extensive time spent with pt to ensure full understanding  of TDN risks. Pt provided verbal consent to treatment. TDN performed to B Upper Trapezius with 0.3 x 30 single needle placements with local twitch response (LTR). Pistoning technique utilized. Improved pain-free motion following intervention.       PATIENT EDUCATION: Education details: form and technique for appropriate exercise and explanation of plan of care.  Person educated: Patient Education method: Customer service manager Education comprehension: verbalized understanding and returned demonstration     HOME EXERCISE PROGRAM: Access Code: [email or web address] URL: [email or web address] Date: 10/10/2021 Prepared by: Bradly Chris  Exercises - Seated Gentle Upper Trapezius Stretch  - 1 x daily - 3 reps - 30 hold - Seated Shoulder Row with Anchored Resistance  - 3 x weekly - 3 sets - 10 reps - Standing Single Arm Shoulder Flexion Towel Slide at Table Top  - 1 x daily - 7 x weekly - 3 sets - 10 reps - Shoulder Abduction Towel Slide at Table Top  - 1 x daily - 7 x weekly - 3 sets - 10 reps - Seated Shoulder Flexion Self PROM  - 1 x daily - 7 x weekly - 3 sets - 10 reps - Shoulder External Rotation and Scapular Retraction with Resistance  - 1 x daily - 3 x weekly - 3 sets - 10 reps - Supine Shoulder Circles with Weight  - 1 x daily - 3 x weekly - 3 sets - 10 reps - Sidelying Shoulder Flexion 15 Degrees  - 1 x daily - 3 x weekly - 3 sets -  10 reps - Isometric Shoulder Extension with Ball at [employer]  - 1 x daily - 7 x weekly - 3 sets - 10 reps - Isometric Shoulder Flexion with Ball at [employer]  - 1 x daily - 7 x weekly - 3 sets - 10 reps - Isometric Shoulder Abduction with Ball at Wall  - 1 x daily - 7 x weekly - 3 sets - 10 reps - Isometric Shoulder Adduction  - 1 x daily - 7 x weekly - 3 sets - 10 reps - Supine Shoulder Abduction AROM  - 1 x daily - 3 x weekly - 3 sets - 10 reps   ASSESSMENT:   CLINICAL IMPRESSION:  Pt presents s/p 8 weeks for RTC repair and subacromial  decompression. She  exhibits an improvement in left shoulder AROM since initial eval, but has yet to reach full ROM on left shoulder. She continues to experience pain in left shoulder with flexion and abduction that is limiting her use of shoulder and causing increased muscular stiffness throughout surrounding musculature. Pt requesting dry needling, but PT unable to provide given lack of certification. PT has tried to have other PTs provide dry needling but no one available during pt's session. PT will reach out about scheduling to see if pt's request is possible given limited availability of other therapists. She will continue to benefit from skilled therapy to address remaining deficits in order to improve overall QoL and return to PLOF.     OBJECTIVE IMPAIRMENTS decreased ROM, decreased strength, impaired UE functional use, and pain.    ACTIVITY LIMITATIONS lifting, dressing, reach over head, and hygiene/grooming   PARTICIPATION LIMITATIONS: cleaning, laundry, driving, and shopping   PERSONAL FACTORS  Diabetes mellitus  are also affecting patient's functional outcome.    REHAB POTENTIAL: Good   CLINICAL DECISION MAKING: Stable/uncomplicated   EVALUATION COMPLEXITY: Low     GOALS: Goals reviewed with patient? No   SHORT TERM GOALS: Target date: 09/14/2021  (Remove Blue Hyperlink)   Pt will be independent with HEP in order to improve strength and balance in order to decrease fall risk and improve function at home and work. Baseline: NT  Goal status: Ongoing    2.  Patient will be able to remove left arm from sling for increased mobility and as a sign of healing.  Baseline:  Currently wearing sling Goal status: Ongoing    3.  Patient will achieve full PROM of left shoulder as a sign of successful healing and to progress to week 4-10 of protocol.  Baseline: Shoulder Flex R/L 180/ PROM70, Shoulder Abd R/L 180/ PROM 80, Shoulder ER R/L 90/ PROM 30 Goal status: Ongoing        LONG  TERM GOALS: Target date: 11/09/2021  (Remove Blue Hyperlink)   Patient will have improved function and activity level as evidenced by an increase in FOTO score by 10 points or more.  Baseline: 23/100 with target of 100 Goal status: Ongoing    2.  Patient will exhibit symmetrical AROM between right and left shoulder to perform reaching, lifting and overhead tasks to carry out ADLs. Baseline: Shoulder Flex R/L 180/ PROM70, Shoulder Abd R/L 180/ PROM 80, Shoulder ER R/L 90/ PROM 30  10/10/21: Shoulder Flex R/L 120/30 (120 on LUE with PROM), Shoulder Abduction AROM R/L 120/80,  Shoulder ER AROM R/L 90/40 Goal status: Ongoing    3.  Patient will exhibit symmetrical shoulder strength between right and left shoulder perform reaching, lifting and overhead tasks to carry  out ADLs. Baseline: Unable to test left shoulder  Goal status: Ongoing      PLAN: PT FREQUENCY: 2x/week   PT DURATION: 10 weeks   PLANNED INTERVENTIONS: Therapeutic exercises, Neuromuscular re-education, Patient/Family education, Self Care, Joint mobilization, Joint manipulation, DME instructions, Dry Needling, Electrical stimulation, Cryotherapy, Moist heat, scar mobilization, Manual therapy, and Re-evaluation   PLAN FOR NEXT SESSION:  Progress AROM exercises for LUE and modify with closed chain exercises    Bradly Chris PT, DPT 10/10/21, 2:18 PM

## 2021-10-11 ENCOUNTER — Telehealth: Payer: Self-pay | Admitting: Orthopaedic Surgery

## 2021-10-11 NOTE — Telephone Encounter (Signed)
[employer] forms received. To [employer].

## 2021-10-12 ENCOUNTER — Ambulatory Visit: Payer: Managed Care, Other (non HMO) | Admitting: Physical Therapy

## 2021-10-12 ENCOUNTER — Encounter: Payer: Self-pay | Admitting: Physical Therapy

## 2021-10-12 ENCOUNTER — Encounter: Payer: Self-pay | Admitting: Orthopaedic Surgery

## 2021-10-12 DIAGNOSIS — Z9889 Other specified postprocedural states: Secondary | ICD-10-CM | POA: Diagnosis not present

## 2021-10-12 DIAGNOSIS — M79622 Pain in left upper arm: Secondary | ICD-10-CM

## 2021-10-12 NOTE — Therapy (Signed)
OUTPATIENT PHYSICAL THERAPY PROGRESS NOTE   Patient Name: Tina Stephenson MRN: [id number] DOB:Feb 08, 1955, 66 y.o., female Today's Date: 10/12/2021  PCP: Delsa Grana PA-C  REFERRING PROVIDER: Dwana Melena PA-C  END OF SESSION:   PT End of Session - 10/12/21 1419     Visit Number 11    Number of Visits 20    Date for PT Re-Evaluation 11/09/21    Authorization Type [employer] [id number]    Authorization - Visit Number 11    Authorization - Number of Visits 60    Progress Note Due on Visit 10    PT Start Time 1415    PT Stop Time 1500    PT Time Calculation (min) 45 min    Activity Tolerance Patient limited by pain    Behavior During Therapy [facility] for tasks assessed/performed              Past Medical History:  Diagnosis Date   Bronchitis    Depression    Diabetes mellitus without complication ([place])    Gallbladder polyp    GERD (gastroesophageal reflux disease)    Hypertension    Hypertensive disorder 03/05/2015   Liver tumor (benign)    Sacrum and coccyx fracture ([place])    Sciatica    Past Surgical History:  Procedure Laterality Date   APPENDECTOMY     NASAL SEPTUM SURGERY     WISDOM TOOTH EXTRACTION     Patient Active Problem List   Diagnosis Date Noted   Nontraumatic complete tear of left rotator cuff 08/15/2021   Nontraumatic complete tear of right rotator cuff 07/29/2021   Upper respiratory infection, acute 12/20/2020   Bruised toe 12/20/2020   Traumatic tear of supraspinatus tendon of right shoulder 09/24/2020   Tendinopathy of right biceps tendon 09/24/2020   Traumatic tear of supraspinatus tendon of left shoulder 09/24/2020   Leg swelling 08/02/2020   Dyslipidemia 01/05/2020   Cervical polyp 08/22/2018   Nocturia 08/22/2018   Intertrigo 08/22/2018   Type 2 diabetes mellitus without complication, without long-term current use of insulin ([place]) 08/22/2018   Cervical spine arthritis with nerve pain 06/25/2018   Herniated disc, cervical 06/25/2018   History of  migraine 03/25/2018   Scoliosis 03/25/2018   History of cyst of breast 03/25/2018   Sacrum and coccyx fracture, sequela 02/21/2018   Class 3 severe obesity due to excess calories without serious comorbidity with body mass index (BMI) of 45.0 to 49.9 in adult ([place]) 02/21/2018   Onychomycosis 02/21/2018   History of hypertension 02/21/2018   Vitamin D deficiency 03/19/2015   Essential hypertension 03/05/2015    REFERRING DIAG: Z98.890 (ICD-10-CM) - H/O repair of rotator cuff  THERAPY DIAG:  S/P left rotator cuff repair  Pain in left upper arm  Rationale for Evaluation and Treatment Rehabilitation  PERTINENT HISTORY: S/p left rtc repair and subacrominal decompression on 08/18/21. H/o same surgery on right shoulder several years ago with successful recovery. She has been icing repeatedly throughout the day for pain and swelling management.   PRECAUTIONS: No AROM for Left Shoulder until 4 weeks   SUBJECTIVE:  Pt reports that she felt an incredible amount of left shoulder pain after last session to the point where she had to lay down and take a break. Otherwise, she feels like she is able to get more motion out of left shoulder. She reports that shoulder ER isometrics are the most painful of the exercises. She does not feel increased muscular tension in left upper trap.  PAIN:  4-5/10 with pain medicine prior to arrival    OBJECTIVE: (objective measures completed at initial evaluation unless otherwise dated)  VITALS: BP 125/67 HR 80 SpO2 100     DIAGNOSTIC FINDINGS:  CLINICAL DATA:  Shoulder pain   EXAM: RIGHT SHOULDER - 3 VIEW   COMPARISON:  None.   FINDINGS: There is no evidence of fracture or dislocation. Mild degenerative changes of the acromioclavicular joint. Bone anchors of the proximal humerus. Soft tissues are unremarkable.   IMPRESSION: No acute osseous abnormality.     Electronically Signed   By: Yetta Glassman M.D.   On: 02/15/2021 12:15      PATIENT SURVEYS:  FOTO 25/100 with target of 53    COGNITION: Overall cognitive status: Within functional limits for tasks assessed                                  SENSATION: WFL   POSTURE: Normal posture    UPPER EXTREMITY ROM:      Cervical: Not performed during eval AROM  Flex    45 Ext      45  Lat Side Bend R/L 45/45 Rotation       R/L     60/60       Active ROM Right eval Left Eval/ All PROM  Shoulder flexion 180 70*  Shoulder extension      Shoulder abduction 180  80*  Shoulder adduction      Shoulder internal rotation 70 70  Shoulder external rotation 90 30*  Elbow flexion 150 150  Elbow extension 60 60  Wrist flexion 80 80  Wrist extension 70 70  Wrist ulnar deviation 30 30  Wrist radial deviation 20 20  Wrist pronation      Wrist supination      (Blank rows = not tested)             UPPER EXTREMITY MMT:   MMT Left  10/05/21  Right eval  Shoulder flexion 3-     Shoulder extension      Shoulder abduction  3+    Shoulder adduction      Shoulder internal rotation  3    Shoulder external rotation 3+     Middle trapezius      Lower trapezius      Elbow flexion      Elbow extension      Wrist flexion   4+  Wrist extension   4+  Wrist ulnar deviation   4+  Wrist radial deviation   4+  Wrist pronation      Wrist supination      Grip strength (lbs)   Fair   (Blank rows = not tested)        JOINT MOBILITY TESTING:  Not performed    PALPATION:  Not performed              TODAY'S TREATMENT:   10/12/21   Shoulder Pulleys AAROM Flex/ Ext  with RUE active only and LUE PROM 3 x 10  Shoulder Pulleys AAROM Abd/ Add  with RUE active only and LUE PROM 3 x 10  OMEGA Seated Rows with #5 1 x 10  Left Shoulder Abduction AROM [employer] 1 x 10  Left Shoulder Flexion AROM White Ball Rolls 1 x 10   Supine Shoulder AAROM Flexion to 90 degrees with PVC pipe 3 x 10  Supine Left  Shoulder Circles Clockwise 3 x 10  Supine Left Shoulder  Circles Counter Clockwise 3 x 10  Serratus Punches 3 x 15   10/10/21  MANUAL  Left upper trap and rhomboid trigger point release   THEREX   Shoulder Pulleys AAROM Flex/ Ext  with RUE active only and LUE PROM 3 x 10  Shoulder Pulleys AAROM Abd/ Add  with RUE active only and LUE PROM 3 x 10   See Shoulder ROM under goals  Shoulder Blade Depression Push Ups from Chair 2 x 10   Standing weight shifts in standing 2 x 10  Supine Shoulder Abduction on LLE 2 x 10 -Pt provided inferior glides grade I and II for pain modulation   Seated Shoulder Blade Depression 1 x 10  -Pt unable to complete because of increased pain    10/05/21   THEREX   MMT Left  10/05/21  Shoulder flexion 3-   Shoulder abduction 3+  Shoulder internal rotation 3  Shoulder external rotation 3+     Left Side Lying Shoulder Flexion and Extension 1 x 10 with #1 DB  Left Shoulder AROM Flexion/Extension Towel Slides with elevated table 2 x 10  Left Shoulder AROM Flexion/Extension Red Physioball 3 x 10  Left Shoulder AROM Abduction/Adduction Red Physioball 1 x 10  Left Shoulder AROM Abduction/Adduction with towel slides 1 x 10  Shoulder IR/ER with red band at 0 degrees abduction 1 x 10   MANUAL THERAPY   Upper trap trigger point release on left upper trap  Left upper trap stretch    10/03/21  TherEx: Shoulder Pulleys AAROM Flex/ Ext  with RUE active only and LUE PROM 3 x 10  Shoulder Pulleys AAROM Abd/ Add  with RUE active only and LUE PROM 3 x 10  Standing shoulder isometrics into physioball in flexion/abduction/adduction/extension for increased strength without pain x10 each direction    MMT Right 10/03/21  Shoulder flexion 5   Shoulder abduction 4+  Shoulder adduction 4+  Shoulder internal rotation 4   Shoulder external rotation 4+   Unable to perform MMT of the L side due to inability to perform full ROM against gravity    Manual:  Supine STM to cervical region to increase extensibility of  the paraspinals Supine UT/Levator stretch, 30 sec bouts to increase tissue extensibility of the cervical region Supine STM with TP release technique applied to UT/Levator to decreased tissue restrictions preventing full ROM    Trigger Point Dry Needling (TDN), unbilled Education performed with patient regarding potential benefit of TDN. Reviewed precautions and risks with patient. Reviewed special precautions/risks over lung fields which include pneumothorax. Reviewed signs and symptoms of pneumothorax and advised pt to go to ER immediately if these symptoms develop advise them of dry needling treatment. Extensive time spent with pt to ensure full understanding of TDN risks. Pt provided verbal consent to treatment. TDN performed to B Upper Trapezius with 0.3 x 30 single needle placements with local twitch response (LTR). Pistoning technique utilized. Improved pain-free motion following intervention.       PATIENT EDUCATION: Education details: form and technique for appropriate exercise and explanation of plan of care.  Person educated: Patient Education method: Customer service manager Education comprehension: verbalized understanding and returned demonstration     HOME EXERCISE PROGRAM: Access Code: [email or web address] URL: [email or web address] Date: 10/12/2021 Prepared by: Bradly Chris  Exercises - Seated Gentle Upper Trapezius Stretch  - 1 x daily - 3 reps - 30 hold - Seated Shoulder Row with Anchored  Resistance  - 3 x weekly - 3 sets - 10 reps - Standing Single Arm Shoulder Flexion Towel Slide at Table Top  - 1 x daily - 7 x weekly - 3 sets - 10 reps - Shoulder Abduction Towel Slide at Table Top  - 1 x daily - 7 x weekly - 3 sets - 10 reps - Seated Shoulder Flexion Self PROM  - 1 x daily - 7 x weekly - 3 sets - 10 reps - Shoulder External Rotation and Scapular Retraction with Resistance  - 1 x daily - 3 x weekly - 3 sets - 10 reps - Supine Shoulder Circles with Weight   - 1 x daily - 3 x weekly - 3 sets - 10 reps - Sidelying Shoulder Flexion 15 Degrees  - 1 x daily - 3 x weekly - 3 sets - 10 reps - Isometric Shoulder Extension with Ball at [employer]  - 1 x daily - 3 x weekly - 3 sets - 10 reps - Isometric Shoulder Flexion with Ball at [employer]  - 1 x daily - 3 x weekly - 3 sets - 10 reps - Isometric Shoulder Adduction  - 1 x daily - 3 x weekly - 3 sets - 10 reps - Supine Shoulder Abduction AROM  - 1 x daily - 3 x weekly - 3 sets - 10 reps - Supine Scapular Protraction in Flexion with Dumbbells  - 1 x daily - 3 x weekly - 3 sets - 15 reps - Seated Shoulder Flexion AAROM with Pulley Behind  - 1 x daily - 7 x weekly - 3 sets - 10 reps - Seated Shoulder Scaption AAROM with Pulley at Side  - 1 x daily - 7 x weekly - 3 sets - 10 reps ASSESSMENT:   CLINICAL IMPRESSION:  Pt presents s/p 8 weeks for RTC repair and subacromial decompression. Pt exhibits an improvement in left shoulder ROM and strength and a decrease in left shoulder pain in response to activity. She was able to complete AROM and AAROM exercises with increased shoulder motion and perform shoulder strengthening exercises with increased resistance such as the seated rows and and serratus punches. She will continue to benefit from skilled therapy to address remaining deficits in order to improve overall QoL and return to PLOF.     OBJECTIVE IMPAIRMENTS decreased ROM, decreased strength, impaired UE functional use, and pain.    ACTIVITY LIMITATIONS lifting, dressing, reach over head, and hygiene/grooming   PARTICIPATION LIMITATIONS: cleaning, laundry, driving, and shopping   PERSONAL FACTORS  Diabetes mellitus  are also affecting patient's functional outcome.    REHAB POTENTIAL: Good   CLINICAL DECISION MAKING: Stable/uncomplicated   EVALUATION COMPLEXITY: Low     GOALS: Goals reviewed with patient? No   SHORT TERM GOALS: Target date: 09/14/2021  (Remove Blue Hyperlink)   Pt will be independent with HEP  in order to improve strength and balance in order to decrease fall risk and improve function at home and work. Baseline: NT  Goal status: Ongoing    2.  Patient will be able to remove left arm from sling for increased mobility and as a sign of healing.  Baseline:  Currently wearing sling Goal status: Ongoing    3.  Patient will achieve full PROM of left shoulder as a sign of successful healing and to progress to week 4-10 of protocol.  Baseline: Shoulder Flex R/L 180/ PROM70, Shoulder Abd R/L 180/ PROM 80, Shoulder ER R/L 90/ PROM 30 Goal status:  Ongoing        LONG TERM GOALS: Target date: 11/09/2021  (Remove Blue Hyperlink)   Patient will have improved function and activity level as evidenced by an increase in FOTO score by 10 points or more.  Baseline: 23/100 with target of 100 Goal status: Ongoing    2.  Patient will exhibit symmetrical AROM between right and left shoulder to perform reaching, lifting and overhead tasks to carry out ADLs. Baseline: Shoulder Flex R/L 180/ PROM70, Shoulder Abd R/L 180/ PROM 80, Shoulder ER R/L 90/ PROM 30  10/10/21: Shoulder Flex R/L 120/30 (120 on LUE with PROM), Shoulder Abduction AROM R/L 120/80,  Shoulder ER AROM R/L 90/40 Goal status: Ongoing    3.  Patient will exhibit symmetrical shoulder strength between right and left shoulder perform reaching, lifting and overhead tasks to carry out ADLs. Baseline: Unable to test left shoulder  Goal status: Ongoing      PLAN: PT FREQUENCY: 2x/week   PT DURATION: 10 weeks   PLANNED INTERVENTIONS: Therapeutic exercises, Neuromuscular re-education, Patient/Family education, Self Care, Joint mobilization, Joint manipulation, DME instructions, Dry Needling, Electrical stimulation, Cryotherapy, Moist heat, scar mobilization, Manual therapy, and Re-evaluation   PLAN FOR NEXT SESSION:  Progress AROM and shoulder and parascapular strengthening exercises    Bradly Chris PT, DPT 10/12/21, 2:20 PM

## 2021-10-17 ENCOUNTER — Ambulatory Visit: Payer: Managed Care, Other (non HMO) | Admitting: Physical Therapy

## 2021-10-17 ENCOUNTER — Encounter: Payer: Self-pay | Admitting: Physical Therapy

## 2021-10-17 DIAGNOSIS — Z9889 Other specified postprocedural states: Secondary | ICD-10-CM | POA: Diagnosis not present

## 2021-10-17 DIAGNOSIS — M79622 Pain in left upper arm: Secondary | ICD-10-CM

## 2021-10-17 NOTE — Therapy (Signed)
OUTPATIENT PHYSICAL THERAPY TREATMENT  NOTE   Patient Name: Tina Stephenson MRN: [id number] DOB:12-13-1955, 66 y.o., female Today's Date: 10/17/2021  PCP: Delsa Grana PA-C  REFERRING PROVIDER: Dwana Melena PA-C  END OF SESSION:   PT End of Session - 10/17/21 1425     Visit Number 12    Number of Visits 20    Date for PT Re-Evaluation 11/09/21    Authorization Type [employer] 2023    Authorization - Visit Number 12    Authorization - Number of Visits 40    Progress Note Due on Visit 27    PT Start Time 0240    PT Stop Time 1500    PT Time Calculation (min) 40 min    Activity Tolerance Patient limited by pain    Behavior During Therapy [facility] for tasks assessed/performed              Past Medical History:  Diagnosis Date   Bronchitis    Depression    Diabetes mellitus without complication ([place])    Gallbladder polyp    GERD (gastroesophageal reflux disease)    Hypertension    Hypertensive disorder 03/05/2015   Liver tumor (benign)    Sacrum and coccyx fracture ([place])    Sciatica    Past Surgical History:  Procedure Laterality Date   APPENDECTOMY     NASAL SEPTUM SURGERY     WISDOM TOOTH EXTRACTION     Patient Active Problem List   Diagnosis Date Noted   Nontraumatic complete tear of left rotator cuff 08/15/2021   Nontraumatic complete tear of right rotator cuff 07/29/2021   Upper respiratory infection, acute 12/20/2020   Bruised toe 12/20/2020   Traumatic tear of supraspinatus tendon of right shoulder 09/24/2020   Tendinopathy of right biceps tendon 09/24/2020   Traumatic tear of supraspinatus tendon of left shoulder 09/24/2020   Leg swelling 08/02/2020   Dyslipidemia 01/05/2020   Cervical polyp 08/22/2018   Nocturia 08/22/2018   Intertrigo 08/22/2018   Type 2 diabetes mellitus without complication, without long-term current use of insulin ([place]) 08/22/2018   Cervical spine arthritis with nerve pain 06/25/2018   Herniated disc, cervical 06/25/2018   History of  migraine 03/25/2018   Scoliosis 03/25/2018   History of cyst of breast 03/25/2018   Sacrum and coccyx fracture, sequela 02/21/2018   Class 3 severe obesity due to excess calories without serious comorbidity with body mass index (BMI) of 45.0 to 49.9 in adult ([place]) 02/21/2018   Onychomycosis 02/21/2018   History of hypertension 02/21/2018   Vitamin D deficiency 03/19/2015   Essential hypertension 03/05/2015    REFERRING DIAG: Z98.890 (ICD-10-CM) - H/O repair of rotator cuff  THERAPY DIAG:  S/P left rotator cuff repair  Pain in left upper arm  Rationale for Evaluation and Treatment Rehabilitation  PERTINENT HISTORY: S/p left rtc repair and subacrominal decompression on 08/18/21. H/o same surgery on right shoulder several years ago with successful recovery. She has been icing repeatedly throughout the day for pain and swelling management.   PRECAUTIONS:   SUBJECTIVE: Pt states that anterior surface of left arm felt like she was being stung by bees this past morning especially when completing serratus punches. She notes continued stiffness around left shoulder.     PAIN:  2-3/10 with pain medicine prior to arrival    OBJECTIVE: (objective measures completed at initial evaluation unless otherwise dated)  VITALS: BP 125/67 HR 80 SpO2 100     DIAGNOSTIC FINDINGS:  CLINICAL DATA:  Shoulder pain  EXAM: RIGHT SHOULDER - 3 VIEW   COMPARISON:  None.   FINDINGS: There is no evidence of fracture or dislocation. Mild degenerative changes of the acromioclavicular joint. Bone anchors of the proximal humerus. Soft tissues are unremarkable.   IMPRESSION: No acute osseous abnormality.     Electronically Signed   By: Yetta Glassman M.D.   On: 02/15/2021 12:15     PATIENT SURVEYS:  FOTO 25/100 with target of 53    COGNITION: Overall cognitive status: Within functional limits for tasks assessed                                  SENSATION: WFL   POSTURE: Normal posture     UPPER EXTREMITY ROM:      Cervical: Not performed during eval AROM  Flex    45 Ext      45  Lat Side Bend R/L 45/45 Rotation       R/L     60/60       Active ROM Right eval Left Eval/ All PROM  Shoulder flexion 180 70*  Shoulder extension      Shoulder abduction 180  80*  Shoulder adduction      Shoulder internal rotation 70 70  Shoulder external rotation 90 30*  Elbow flexion 150 150  Elbow extension 60 60  Wrist flexion 80 80  Wrist extension 70 70  Wrist ulnar deviation 30 30  Wrist radial deviation 20 20  Wrist pronation      Wrist supination      (Blank rows = not tested)             UPPER EXTREMITY MMT:   MMT Left  10/05/21  Right eval  Shoulder flexion 3-     Shoulder extension      Shoulder abduction  3+    Shoulder adduction      Shoulder internal rotation  3    Shoulder external rotation 3+     Middle trapezius      Lower trapezius      Elbow flexion      Elbow extension      Wrist flexion   4+  Wrist extension   4+  Wrist ulnar deviation   4+  Wrist radial deviation   4+  Wrist pronation      Wrist supination      Grip strength (lbs)   Fair   (Blank rows = not tested)        JOINT MOBILITY TESTING:  Not performed    PALPATION:  Not performed              TODAY'S TREATMENT:   10/17/21:  Shoulder Pulleys AAROM Flex/ Ext  with RUE active only and LUE PROM 3 x 10  Shoulder Pulleys AAROM Abd/ Add  with RUE active only and LUE PROM 3 x 10  Supine Shoulder AAROM Flexion/Ext with 2.5 AW with PVC wand 1 x 10  -Pt reports an increase in NPS to 5-6/10  Supine Shoulder AAROM Flexion/Ext with PVC wand 2 x 10  Supine Shoulder AAROM Abduction/Adduction with PVC 1 x 10  Supine Shoulder ER at 45 deg abduction with 2 lbs DB 5 min hold    Manual Therapy   Grade III Inferior Glide  on LUE x 30   10/12/21   Shoulder Pulleys AAROM Flex/ Ext  with RUE active only and LUE PROM 3 x  10  Shoulder Pulleys AAROM Abd/ Add  with RUE active only  and LUE PROM 3 x 10  OMEGA Seated Rows with #5 1 x 10  Left Shoulder Abduction AROM [employer] 1 x 10  Left Shoulder Flexion AROM White Ball Rolls 1 x 10   Supine Shoulder AAROM Flexion to 90 degrees with PVC pipe 3 x 10  Supine Left  Shoulder Circles Clockwise 3 x 10  Supine Left Shoulder Circles Counter Clockwise 3 x 10  Serratus Punches 3 x 15   10/10/21  MANUAL  Left upper trap and rhomboid trigger point release   THEREX   Shoulder Pulleys AAROM Flex/ Ext  with RUE active only and LUE PROM 3 x 10  Shoulder Pulleys AAROM Abd/ Add  with RUE active only and LUE PROM 3 x 10   See Shoulder ROM under goals  Shoulder Blade Depression Push Ups from Chair 2 x 10   Standing weight shifts in standing 2 x 10  Supine Shoulder Abduction on LLE 2 x 10 -Pt provided inferior glides grade I and II for pain modulation   Seated Shoulder Blade Depression 1 x 10  -Pt unable to complete because of increased pain    10/05/21   THEREX   MMT Left  10/05/21  Shoulder flexion 3-   Shoulder abduction 3+  Shoulder internal rotation 3  Shoulder external rotation 3+     Left Side Lying Shoulder Flexion and Extension 1 x 10 with #1 DB  Left Shoulder AROM Flexion/Extension Towel Slides with elevated table 2 x 10  Left Shoulder AROM Flexion/Extension Red Physioball 3 x 10  Left Shoulder AROM Abduction/Adduction Red Physioball 1 x 10  Left Shoulder AROM Abduction/Adduction with towel slides 1 x 10  Shoulder IR/ER with red band at 0 degrees abduction 1 x 10   MANUAL THERAPY   Upper trap trigger point release on left upper trap  Left upper trap stretch    10/03/21  TherEx: Shoulder Pulleys AAROM Flex/ Ext  with RUE active only and LUE PROM 3 x 10  Shoulder Pulleys AAROM Abd/ Add  with RUE active only and LUE PROM 3 x 10  Standing shoulder isometrics into physioball in flexion/abduction/adduction/extension for increased strength without pain x10 each direction    MMT Right 10/03/21   Shoulder flexion 5   Shoulder abduction 4+  Shoulder adduction 4+  Shoulder internal rotation 4   Shoulder external rotation 4+   Unable to perform MMT of the L side due to inability to perform full ROM against gravity    Manual:  Supine STM to cervical region to increase extensibility of the paraspinals Supine UT/Levator stretch, 30 sec bouts to increase tissue extensibility of the cervical region Supine STM with TP release technique applied to UT/Levator to decreased tissue restrictions preventing full ROM    Trigger Point Dry Needling (TDN), unbilled Education performed with patient regarding potential benefit of TDN. Reviewed precautions and risks with patient. Reviewed special precautions/risks over lung fields which include pneumothorax. Reviewed signs and symptoms of pneumothorax and advised pt to go to ER immediately if these symptoms develop advise them of dry needling treatment. Extensive time spent with pt to ensure full understanding of TDN risks. Pt provided verbal consent to treatment. TDN performed to B Upper Trapezius with 0.3 x 30 single needle placements with local twitch response (LTR). Pistoning technique utilized. Improved pain-free motion following intervention.       PATIENT EDUCATION: Education details: form and technique  for appropriate exercise and explanation of plan of care.  Person educated: Patient Education method: Customer service manager Education comprehension: verbalized understanding and returned demonstration     HOME EXERCISE PROGRAM: Access Code: [email or web address] URL: [email or web address] Date: 10/17/2021 Prepared by: Bradly Chris  Exercises - Seated Gentle Upper Trapezius Stretch  - 1 x daily - 3 reps - 30 hold - Seated Shoulder Row with Anchored Resistance  - 3 x weekly - 3 sets - 10 reps - Standing Single Arm Shoulder Flexion Towel Slide at Table Top  - 1 x daily - 7 x weekly - 3 sets - 10 reps - Shoulder Abduction  Towel Slide at Table Top  - 1 x daily - 7 x weekly - 3 sets - 10 reps - Seated Shoulder Flexion Self PROM  - 1 x daily - 7 x weekly - 3 sets - 10 reps - Shoulder External Rotation and Scapular Retraction with Resistance  - 1 x daily - 3 x weekly - 3 sets - 10 reps - Supine Shoulder Circles with Weight  - 1 x daily - 3 x weekly - 3 sets - 10 reps - Sidelying Shoulder Flexion 15 Degrees  - 1 x daily - 3 x weekly - 3 sets - 10 reps - Isometric Shoulder Extension with Ball at [employer]  - 1 x daily - 3 x weekly - 3 sets - 10 reps - Isometric Shoulder Flexion with Ball at [employer]  - 1 x daily - 3 x weekly - 3 sets - 10 reps - Supine Shoulder Abduction AROM  - 1 x daily - 3 x weekly - 3 sets - 10 reps - Supine Scapular Protraction in Flexion with Dumbbells  - 1 x daily - 3 x weekly - 3 sets - 15 reps - Seated Shoulder Flexion AAROM with Pulley Behind  - 1 x daily - 7 x weekly - 3 sets - 10 reps - Seated Shoulder Scaption AAROM with Pulley at Side  - 1 x daily - 7 x weekly - 3 sets - 10 reps - Supine Shoulder External Rotation PROM with Caregiver  - 1 x daily - 3 reps - 5-10 min hold   ASSESSMENT:   CLINICAL IMPRESSION:  Pt presents s/p 9 weeks for RTC repair and subacromial decompression. Pt continues to be limited by pain and muscle guarding in left shoulder AROM flexion and abduction that places her behind timeline for rehab protocol. Given muscular restrictions with passive and active ROM, PT has concern for adhesive capsulitis. PT to reach out to medical team for possibility of further evaluation and treatment. She will continue to benefit from skilled therapy to address remaining deficits in order to improve overall QoL and return to PLOF   OBJECTIVE IMPAIRMENTS decreased ROM, decreased strength, impaired UE functional use, and pain.    ACTIVITY LIMITATIONS lifting, dressing, reach over head, and hygiene/grooming   PARTICIPATION LIMITATIONS: cleaning, laundry, driving, and shopping   PERSONAL  FACTORS  Diabetes mellitus  are also affecting patient's functional outcome.    REHAB POTENTIAL: Good   CLINICAL DECISION MAKING: Stable/uncomplicated   EVALUATION COMPLEXITY: Low     GOALS: Goals reviewed with patient? No   SHORT TERM GOALS: Target date: 09/14/2021  (Remove Blue Hyperlink)   Pt will be independent with HEP in order to improve strength and balance in order to decrease fall risk and improve function at home and work. Baseline: NT  Goal status: Ongoing    2.  Patient will be able to  remove left arm from sling for increased mobility and as a sign of healing.  Baseline:  Currently wearing sling Goal status: Ongoing    3.  Patient will achieve full PROM of left shoulder as a sign of successful healing and to progress to week 4-10 of protocol.  Baseline: Shoulder Flex R/L 180/ PROM70, Shoulder Abd R/L 180/ PROM 80, Shoulder ER R/L 90/ PROM 30 Goal status: Ongoing        LONG TERM GOALS: Target date: 11/09/2021  (Remove Blue Hyperlink)   Patient will have improved function and activity level as evidenced by an increase in FOTO score by 10 points or more.  Baseline: 23/100 with target of 100 Goal status: Ongoing    2.  Patient will exhibit symmetrical AROM between right and left shoulder to perform reaching, lifting and overhead tasks to carry out ADLs. Baseline: Shoulder Flex R/L 180/ PROM70, Shoulder Abd R/L 180/ PROM 80, Shoulder ER R/L 90/ PROM 30  10/10/21: Shoulder Flex R/L 120/30 (120 on LUE with PROM), Shoulder Abduction AROM R/L 120/80,  Shoulder ER AROM R/L 90/40 Goal status: Ongoing    3.  Patient will exhibit symmetrical shoulder strength between right and left shoulder perform reaching, lifting and overhead tasks to carry out ADLs. Baseline: Unable to test left shoulder  Goal status: Ongoing      PLAN: PT FREQUENCY: 2x/week   PT DURATION: 10 weeks   PLANNED INTERVENTIONS: Therapeutic exercises, Neuromuscular re-education, Patient/Family education,  Self Care, Joint mobilization, Joint manipulation, DME instructions, Dry Needling, Electrical stimulation, Cryotherapy, Moist heat, scar mobilization, Manual therapy, and Re-evaluation   PLAN FOR NEXT SESSION:  Progress AROM and shoulder and parascapular strengthening exercises    Bradly Chris PT, DPT 10/17/21, 2:26 PM

## 2021-10-18 ENCOUNTER — Encounter: Payer: Self-pay | Admitting: Orthopaedic Surgery

## 2021-10-19 ENCOUNTER — Ambulatory Visit: Payer: Managed Care, Other (non HMO) | Admitting: Physical Therapy

## 2021-10-19 ENCOUNTER — Encounter: Payer: Self-pay | Admitting: Physical Therapy

## 2021-10-19 DIAGNOSIS — Z9889 Other specified postprocedural states: Secondary | ICD-10-CM | POA: Diagnosis not present

## 2021-10-19 NOTE — Therapy (Signed)
OUTPATIENT PHYSICAL THERAPY TREATMENT  NOTE   Patient Name: Tina Stephenson MRN: [id number] DOB:1955-12-26, 66 y.o., female Today's Date: 10/19/2021  PCP: Delsa Grana PA-C  REFERRING PROVIDER: Dwana Melena PA-C  END OF SESSION:   PT End of Session - 10/19/21 1421     Visit Number 13    Number of Visits 20    Date for PT Re-Evaluation 11/09/21    Authorization Type [employer] 2023    Authorization - Visit Number 49    Authorization - Number of Visits 40    Progress Note Due on Visit 53    PT Start Time 9628    PT Stop Time 1500    PT Time Calculation (min) 43 min    Activity Tolerance Patient limited by pain    Behavior During Therapy [facility] for tasks assessed/performed              Past Medical History:  Diagnosis Date   Bronchitis    Depression    Diabetes mellitus without complication ([place])    Gallbladder polyp    GERD (gastroesophageal reflux disease)    Hypertension    Hypertensive disorder 03/05/2015   Liver tumor (benign)    Sacrum and coccyx fracture ([place])    Sciatica    Past Surgical History:  Procedure Laterality Date   APPENDECTOMY     NASAL SEPTUM SURGERY     WISDOM TOOTH EXTRACTION     Patient Active Problem List   Diagnosis Date Noted   Nontraumatic complete tear of left rotator cuff 08/15/2021   Nontraumatic complete tear of right rotator cuff 07/29/2021   Upper respiratory infection, acute 12/20/2020   Bruised toe 12/20/2020   Traumatic tear of supraspinatus tendon of right shoulder 09/24/2020   Tendinopathy of right biceps tendon 09/24/2020   Traumatic tear of supraspinatus tendon of left shoulder 09/24/2020   Leg swelling 08/02/2020   Dyslipidemia 01/05/2020   Cervical polyp 08/22/2018   Nocturia 08/22/2018   Intertrigo 08/22/2018   Type 2 diabetes mellitus without complication, without long-term current use of insulin ([place]) 08/22/2018   Cervical spine arthritis with nerve pain 06/25/2018   Herniated disc, cervical 06/25/2018   History of  migraine 03/25/2018   Scoliosis 03/25/2018   History of cyst of breast 03/25/2018   Sacrum and coccyx fracture, sequela 02/21/2018   Class 3 severe obesity due to excess calories without serious comorbidity with body mass index (BMI) of 45.0 to 49.9 in adult ([place]) 02/21/2018   Onychomycosis 02/21/2018   History of hypertension 02/21/2018   Vitamin D deficiency 03/19/2015   Essential hypertension 03/05/2015    REFERRING DIAG: Z98.890 (ICD-10-CM) - H/O repair of rotator cuff  THERAPY DIAG:  S/P left rotator cuff repair  Rationale for Evaluation and Treatment Rehabilitation  PERTINENT HISTORY: S/p left rtc repair and subacrominal decompression on 08/18/21. H/o same surgery on right shoulder several years ago with successful recovery. She has been icing repeatedly throughout the day for pain and swelling management.   PRECAUTIONS:   SUBJECTIVE: Pt reports feeling increased pain in left shoulder after performing ER stretch in supine. She was able to perform all exercises but they did increase her pain.     PAIN:  2-3/10 in left anterior shoulder; with pain medicine prior to arrival    OBJECTIVE: (objective measures completed at initial evaluation unless otherwise dated)  VITALS: BP 125/67 HR 80 SpO2 100     DIAGNOSTIC FINDINGS:  CLINICAL DATA:  Shoulder pain   EXAM: RIGHT SHOULDER - 3  VIEW   COMPARISON:  None.   FINDINGS: There is no evidence of fracture or dislocation. Mild degenerative changes of the acromioclavicular joint. Bone anchors of the proximal humerus. Soft tissues are unremarkable.   IMPRESSION: No acute osseous abnormality.     Electronically Signed   By: Yetta Glassman M.D.   On: 02/15/2021 12:15     PATIENT SURVEYS:  FOTO 25/100 with target of 53    COGNITION: Overall cognitive status: Within functional limits for tasks assessed                                  SENSATION: WFL   POSTURE: Normal posture    UPPER EXTREMITY ROM:       Cervical: Not performed during eval AROM  Flex    45 Ext      45  Lat Side Bend R/L 45/45 Rotation       R/L     60/60       Active ROM Right eval Left Eval/ All PROM  Shoulder flexion 180 70*  Shoulder extension      Shoulder abduction 180  80*  Shoulder adduction      Shoulder internal rotation 70 70  Shoulder external rotation 90 30*  Elbow flexion 150 150  Elbow extension 60 60  Wrist flexion 80 80  Wrist extension 70 70  Wrist ulnar deviation 30 30  Wrist radial deviation 20 20  Wrist pronation      Wrist supination      (Blank rows = not tested)             UPPER EXTREMITY MMT:   MMT Left  10/05/21  Right eval  Shoulder flexion 3-     Shoulder extension      Shoulder abduction  3+    Shoulder adduction      Shoulder internal rotation  3    Shoulder external rotation 3+     Middle trapezius      Lower trapezius      Elbow flexion      Elbow extension      Wrist flexion  4+  Wrist extension   4+  Wrist ulnar deviation   4+  Wrist radial deviation   4+  Wrist pronation      Wrist supination      Grip strength (lbs)   Fair   (Blank rows = not tested)        JOINT MOBILITY TESTING:  Not performed    PALPATION:  Not performed              TODAY'S TREATMENT:   10/19/21:   THEREX:   Shoulder Pulleys AAROM Flex/ Ext  with RUE active only and LUE PROM 3 x 10  Shoulder Pulleys AAROM Abd/ Add  with RUE active only and LUE PROM 3 x 10  Supine Shoulder Flexion AAROM wand  between 95  with 2.5 lbs 3 x 10  Supine Shoulder Left Abduction AAROM wand between 0 to 75 3 x 10   MANUAL   Shoulder Flexion left to 120 degrees with heat pack  Shoulder abduction left to 90 degrees with heat pack MFR of left upper trap  MFR of right rhomboid     10/17/21:  Shoulder Pulleys AAROM Flex/ Ext  with RUE active only and LUE PROM 3 x 10  Shoulder Pulleys AAROM Abd/ Add  with RUE active only  and LUE PROM 3 x 10  Supine Shoulder AAROM Flexion/Ext with 2.5  AW with PVC wand 1 x 10  -Pt reports an increase in NPS to 5-6/10  Supine Shoulder AAROM Flexion/Ext with PVC wand 2 x 10  Supine Shoulder AAROM Abduction/Adduction with PVC 1 x 10  Supine Shoulder ER at 45 deg abduction with 2 lbs DB 5 min hold    Manual Therapy   Grade III Inferior Glide  on LUE x 30   10/12/21   Shoulder Pulleys AAROM Flex/ Ext  with RUE active only and LUE PROM 3 x 10  Shoulder Pulleys AAROM Abd/ Add  with RUE active only and LUE PROM 3 x 10  OMEGA Seated Rows with #5 1 x 10  Left Shoulder Abduction AROM [employer] 1 x 10  Left Shoulder Flexion AROM White Ball Rolls 1 x 10   Supine Shoulder AAROM Flexion to 90 degrees with PVC pipe 3 x 10  Supine Left  Shoulder Circles Clockwise 3 x 10  Supine Left Shoulder Circles Counter Clockwise 3 x 10  Serratus Punches 3 x 15   10/10/21  MANUAL  Left upper trap and rhomboid trigger point release   THEREX   Shoulder Pulleys AAROM Flex/ Ext  with RUE active only and LUE PROM 3 x 10  Shoulder Pulleys AAROM Abd/ Add  with RUE active only and LUE PROM 3 x 10   See Shoulder ROM under goals  Shoulder Blade Depression Push Ups from Chair 2 x 10   Standing weight shifts in standing 2 x 10  Supine Shoulder Abduction on LLE 2 x 10 -Pt provided inferior glides grade I and II for pain modulation   Seated Shoulder Blade Depression 1 x 10  -Pt unable to complete because of increased pain    10/05/21   THEREX   MMT Left  10/05/21  Shoulder flexion 3-   Shoulder abduction 3+  Shoulder internal rotation 3  Shoulder external rotation 3+     Left Side Lying Shoulder Flexion and Extension 1 x 10 with #1 DB  Left Shoulder AROM Flexion/Extension Towel Slides with elevated table 2 x 10  Left Shoulder AROM Flexion/Extension Red Physioball 3 x 10  Left Shoulder AROM Abduction/Adduction Red Physioball 1 x 10  Left Shoulder AROM Abduction/Adduction with towel slides 1 x 10  Shoulder IR/ER with red band at 0 degrees  abduction 1 x 10   MANUAL THERAPY   Upper trap trigger point release on left upper trap  Left upper trap stretch    10/03/21  TherEx: Shoulder Pulleys AAROM Flex/ Ext  with RUE active only and LUE PROM 3 x 10  Shoulder Pulleys AAROM Abd/ Add  with RUE active only and LUE PROM 3 x 10  Standing shoulder isometrics into physioball in flexion/abduction/adduction/extension for increased strength without pain x10 each direction    MMT Right 10/03/21  Shoulder flexion 5   Shoulder abduction 4+  Shoulder adduction 4+  Shoulder internal rotation 4   Shoulder external rotation 4+   Unable to perform MMT of the L side due to inability to perform full ROM against gravity    Manual:  Supine STM to cervical region to increase extensibility of the paraspinals Supine UT/Levator stretch, 30 sec bouts to increase tissue extensibility of the cervical region Supine STM with TP release technique applied to UT/Levator to decreased tissue restrictions preventing full ROM    Trigger Point Dry Needling (TDN), unbilled Education performed with patient  regarding potential benefit of TDN. Reviewed precautions and risks with patient. Reviewed special precautions/risks over lung fields which include pneumothorax. Reviewed signs and symptoms of pneumothorax and advised pt to go to ER immediately if these symptoms develop advise them of dry needling treatment. Extensive time spent with pt to ensure full understanding of TDN risks. Pt provided verbal consent to treatment. TDN performed to B Upper Trapezius with 0.3 x 30 single needle placements with local twitch response (LTR). Pistoning technique utilized. Improved pain-free motion following intervention.       PATIENT EDUCATION: Education details: form and technique for appropriate exercise and explanation of plan of care.  Person educated: Patient Education method: Customer service manager Education comprehension: verbalized understanding and  returned demonstration     HOME EXERCISE PROGRAM: Access Code: [email or web address] URL: [email or web address] Date: 10/17/2021 Prepared by: Bradly Chris  Exercises - Seated Gentle Upper Trapezius Stretch  - 1 x daily - 3 reps - 30 hold - Seated Shoulder Row with Anchored Resistance  - 3 x weekly - 3 sets - 10 reps - Standing Single Arm Shoulder Flexion Towel Slide at Table Top  - 1 x daily - 7 x weekly - 3 sets - 10 reps - Shoulder Abduction Towel Slide at Table Top  - 1 x daily - 7 x weekly - 3 sets - 10 reps - Seated Shoulder Flexion Self PROM  - 1 x daily - 7 x weekly - 3 sets - 10 reps - Shoulder External Rotation and Scapular Retraction with Resistance  - 1 x daily - 3 x weekly - 3 sets - 10 reps - Supine Shoulder Circles with Weight  - 1 x daily - 3 x weekly - 3 sets - 10 reps - Sidelying Shoulder Flexion 15 Degrees  - 1 x daily - 3 x weekly - 3 sets - 10 reps - Isometric Shoulder Extension with Ball at [employer]  - 1 x daily - 3 x weekly - 3 sets - 10 reps - Isometric Shoulder Flexion with Ball at [employer]  - 1 x daily - 3 x weekly - 3 sets - 10 reps - Supine Shoulder Abduction AROM  - 1 x daily - 3 x weekly - 3 sets - 10 reps - Supine Scapular Protraction in Flexion with Dumbbells  - 1 x daily - 3 x weekly - 3 sets - 15 reps - Seated Shoulder Flexion AAROM with Pulley Behind  - 1 x daily - 7 x weekly - 3 sets - 10 reps - Seated Shoulder Scaption AAROM with Pulley at Side  - 1 x daily - 7 x weekly - 3 sets - 10 reps - Supine Shoulder External Rotation PROM with Caregiver  - 1 x daily - 3 reps - 5-10 min hold   ASSESSMENT:   CLINICAL IMPRESSION:  Pt presents s/p 9 weeks for RTC repair and subacromial decompression. Pt shows limitations with left shoulder ROM due to pain. Pt demonstrated improved PROM with significant increase in pain. PT still has growing concern of adhesive capsulities and will be reaching out to medical team for further evaluation and treatment to explain pt's  continued ROM deficits along with pain.  She will continue to benefit from skilled therapy to address remaining deficits in order to improve overall QoL and return to PLOF.   OBJECTIVE IMPAIRMENTS decreased ROM, decreased strength, impaired UE functional use, and pain.    ACTIVITY LIMITATIONS lifting, dressing, reach over head, and hygiene/grooming   PARTICIPATION LIMITATIONS: cleaning, laundry, driving, and  shopping   PERSONAL FACTORS  Diabetes mellitus  are also affecting patient's functional outcome.    REHAB POTENTIAL: Good   CLINICAL DECISION MAKING: Stable/uncomplicated   EVALUATION COMPLEXITY: Low     GOALS: Goals reviewed with patient? No   SHORT TERM GOALS: Target date: 09/14/2021  (Remove Blue Hyperlink)   Pt will be independent with HEP in order to improve strength and balance in order to decrease fall risk and improve function at home and work. Baseline: NT  Goal status: Ongoing    2.  Patient will be able to remove left arm from sling for increased mobility and as a sign of healing.  Baseline:  Currently wearing sling Goal status: Ongoing    3.  Patient will achieve full PROM of left shoulder as a sign of successful healing and to progress to week 4-10 of protocol.  Baseline: Shoulder Flex R/L 180/ PROM70, Shoulder Abd R/L 180/ PROM 80, Shoulder ER R/L 90/ PROM 30 Goal status: Ongoing        LONG TERM GOALS: Target date: 11/09/2021  (Remove Blue Hyperlink)   Patient will have improved function and activity level as evidenced by an increase in FOTO score by 10 points or more.  Baseline: 23/100 with target of 100 Goal status: Ongoing    2.  Patient will exhibit symmetrical AROM between right and left shoulder to perform reaching, lifting and overhead tasks to carry out ADLs. Baseline: Shoulder Flex R/L 180/ PROM70, Shoulder Abd R/L 180/ PROM 80, Shoulder ER R/L 90/ PROM 30  10/10/21: Shoulder Flex R/L 120/30 (120 on LUE with PROM), Shoulder Abduction AROM R/L 120/80,   Shoulder ER AROM R/L 90/40 Goal status: Ongoing    3.  Patient will exhibit symmetrical shoulder strength between right and left shoulder perform reaching, lifting and overhead tasks to carry out ADLs. Baseline: Unable to test left shoulder  Goal status: Ongoing      PLAN: PT FREQUENCY: 2x/week   PT DURATION: 10 weeks   PLANNED INTERVENTIONS: Therapeutic exercises, Neuromuscular re-education, Patient/Family education, Self Care, Joint mobilization, Joint manipulation, DME instructions, Dry Needling, Electrical stimulation, Cryotherapy, Moist heat, scar mobilization, Manual therapy, and Re-evaluation   PLAN FOR NEXT SESSION:  Progress AROM and shoulder and parascapular strengthening exercises    Bradly Chris PT, DPT 10/19/21, 2:22 PM

## 2021-10-24 ENCOUNTER — Ambulatory Visit: Payer: Managed Care, Other (non HMO) | Admitting: Physical Therapy

## 2021-10-24 DIAGNOSIS — Z9889 Other specified postprocedural states: Secondary | ICD-10-CM | POA: Diagnosis not present

## 2021-10-24 DIAGNOSIS — M79622 Pain in left upper arm: Secondary | ICD-10-CM

## 2021-10-24 NOTE — Therapy (Signed)
OUTPATIENT PHYSICAL THERAPY TREATMENT  NOTE   Patient Name: Tina Stephenson MRN: [id number] DOB:04/26/1955, 66 y.o., female Today's Date: 10/24/2021  PCP: Delsa Grana PA-C  REFERRING PROVIDER: Dwana Melena PA-C  END OF SESSION:   PT End of Session - 10/24/21 1411     Visit Number 14    Number of Visits 20    Date for PT Re-Evaluation 11/09/21    Authorization Type [employer] 2023    Authorization Time Period 08/30/21-11/09/21    Authorization - Visit Number 68    Authorization - Number of Visits 40    Progress Note Due on Visit 64    PT Start Time 0865    PT Stop Time 1500    PT Time Calculation (min) 45 min    Activity Tolerance Patient limited by pain    Behavior During Therapy [facility] for tasks assessed/performed              Past Medical History:  Diagnosis Date   Bronchitis    Depression    Diabetes mellitus without complication ([place])    Gallbladder polyp    GERD (gastroesophageal reflux disease)    Hypertension    Hypertensive disorder 03/05/2015   Liver tumor (benign)    Sacrum and coccyx fracture ([place])    Sciatica    Past Surgical History:  Procedure Laterality Date   APPENDECTOMY     NASAL SEPTUM SURGERY     WISDOM TOOTH EXTRACTION     Patient Active Problem List   Diagnosis Date Noted   Nontraumatic complete tear of left rotator cuff 08/15/2021   Nontraumatic complete tear of right rotator cuff 07/29/2021   Upper respiratory infection, acute 12/20/2020   Bruised toe 12/20/2020   Traumatic tear of supraspinatus tendon of right shoulder 09/24/2020   Tendinopathy of right biceps tendon 09/24/2020   Traumatic tear of supraspinatus tendon of left shoulder 09/24/2020   Leg swelling 08/02/2020   Dyslipidemia 01/05/2020   Cervical polyp 08/22/2018   Nocturia 08/22/2018   Intertrigo 08/22/2018   Type 2 diabetes mellitus without complication, without long-term current use of insulin ([place]) 08/22/2018   Cervical spine arthritis with nerve pain 06/25/2018    Herniated disc, cervical 06/25/2018   History of migraine 03/25/2018   Scoliosis 03/25/2018   History of cyst of breast 03/25/2018   Sacrum and coccyx fracture, sequela 02/21/2018   Class 3 severe obesity due to excess calories without serious comorbidity with body mass index (BMI) of 45.0 to 49.9 in adult ([place]) 02/21/2018   Onychomycosis 02/21/2018   History of hypertension 02/21/2018   Vitamin D deficiency 03/19/2015   Essential hypertension 03/05/2015    REFERRING DIAG: Z98.890 (ICD-10-CM) - H/O repair of rotator cuff  THERAPY DIAG:  S/P left rotator cuff repair  Pain in left upper arm  Rationale for Evaluation and Treatment Rehabilitation  PERTINENT HISTORY: S/p left rtc repair and subacrominal decompression on 08/18/21. H/o same surgery on right shoulder several years ago with successful recovery. She has been icing repeatedly throughout the day for pain and swelling management.   PRECAUTIONS:   SUBJECTIVE: Pt reports improvement in her left shoulder pain and she has been able to progress seated row exercises.    PAIN:  2-3/10 in left anterior shoulder; with pain medicine prior to arrival    OBJECTIVE: (objective measures completed at initial evaluation unless otherwise dated)  VITALS: BP 125/67 HR 80 SpO2 100     DIAGNOSTIC FINDINGS:  CLINICAL DATA:  Shoulder pain   EXAM: RIGHT  SHOULDER - 3 VIEW   COMPARISON:  None.   FINDINGS: There is no evidence of fracture or dislocation. Mild degenerative changes of the acromioclavicular joint. Bone anchors of the proximal humerus. Soft tissues are unremarkable.   IMPRESSION: No acute osseous abnormality.     Electronically Signed   By: Yetta Glassman M.D.   On: 02/15/2021 12:15     PATIENT SURVEYS:  FOTO 25/100 with target of 53    COGNITION: Overall cognitive status: Within functional limits for tasks assessed                                  SENSATION: WFL   POSTURE: Normal posture    UPPER  EXTREMITY ROM:      Cervical: Not performed during eval AROM  Flex    45 Ext      45  Lat Side Bend R/L 45/45 Rotation       R/L     60/60       Active ROM Right eval Left Eval/ All PROM  Shoulder flexion 180 70*  Shoulder extension      Shoulder abduction 180  80*  Shoulder adduction      Shoulder internal rotation 70 70  Shoulder external rotation 90 30*  Elbow flexion 150 150  Elbow extension 60 60  Wrist flexion 80 80  Wrist extension 70 70  Wrist ulnar deviation 30 30  Wrist radial deviation 20 20  Wrist pronation      Wrist supination      (Blank rows = not tested)             UPPER EXTREMITY MMT:   MMT Left  10/05/21  Right eval  Shoulder flexion 3-     Shoulder extension      Shoulder abduction  3+    Shoulder adduction      Shoulder internal rotation  3    Shoulder external rotation 3+     Middle trapezius      Lower trapezius      Elbow flexion      Elbow extension      Wrist flexion  4+  Wrist extension   4+  Wrist ulnar deviation   4+  Wrist radial deviation   4+  Wrist pronation      Wrist supination      Grip strength (lbs)   Fair   (Blank rows = not tested)        JOINT MOBILITY TESTING:  Not performed    PALPATION:  Not performed              TODAY'S TREATMENT:   10/24/21:  Shoulder Pulleys AAROM Flex/ Ext  with RUE active only and LUE PROM 3 x 10  Shoulder Pulleys AAROM Abd/ Add  with RUE active only and LUE PROM 3 x 10  Supine Shoulder Flexion AAROM wand  between 120 with 2.5 lbs 3 x 10  -NPS 3 to 4 /10  Supine Shoulder Left D2 Flexion to 0 degree flexion 1 x 10  -NPS 8/10, pt self terminated  Right Side Lying Left Shoulder Flexion to 120 deg 1 x 10  Right Side Lying Left Shoulder Flexion to 120 deg with #1 DB 1 x 10  Side Lying Lat Stretch with PT providing over pressure over left hip 3 x 30 sec  Supine Pec Stretch with half roll underneath 30 sec  -Pt  experiences too much pain to extend left elbow    10/19/21:    THEREX:   Shoulder Pulleys AAROM Flex/ Ext  with RUE active only and LUE PROM 3 x 10  Shoulder Pulleys AAROM Abd/ Add  with RUE active only and LUE PROM 3 x 10  Supine Shoulder Flexion AAROM wand  between 95  with 2.5 lbs 3 x 10  Supine Shoulder Left Abduction AAROM wand between 0 to 75 3 x 10   MANUAL   Shoulder Flexion left to 120 degrees with heat pack  Shoulder abduction left to 90 degrees with heat pack MFR of left upper trap  MFR of right rhomboid     10/17/21:  Shoulder Pulleys AAROM Flex/ Ext  with RUE active only and LUE PROM 3 x 10  Shoulder Pulleys AAROM Abd/ Add  with RUE active only and LUE PROM 3 x 10  Supine Shoulder AAROM Flexion/Ext with 2.5 AW with PVC wand 1 x 10  -Pt reports an increase in NPS to 5-6/10  Supine Shoulder AAROM Flexion/Ext with PVC wand 2 x 10  Supine Shoulder AAROM Abduction/Adduction with PVC 1 x 10  Supine Shoulder ER at 45 deg abduction with 2 lbs DB 5 min hold    Manual Therapy   Grade III Inferior Glide  on LUE x 30   10/12/21   Shoulder Pulleys AAROM Flex/ Ext  with RUE active only and LUE PROM 3 x 10  Shoulder Pulleys AAROM Abd/ Add  with RUE active only and LUE PROM 3 x 10  OMEGA Seated Rows with #5 1 x 10  Left Shoulder Abduction AROM [employer] 1 x 10  Left Shoulder Flexion AROM White Ball Rolls 1 x 10   Supine Shoulder AAROM Flexion to 90 degrees with PVC pipe 3 x 10  Supine Left  Shoulder Circles Clockwise 3 x 10  Supine Left Shoulder Circles Counter Clockwise 3 x 10  Serratus Punches 3 x 15    PATIENT EDUCATION: Education details: form and technique for appropriate exercise and explanation of plan of care.  Person educated: Patient Education method: Customer service manager Education comprehension: verbalized understanding and returned demonstration     HOME EXERCISE PROGRAM: Access Code: [email or web address] URL: [email or web address] Date: 10/17/2021 Prepared by: Bradly Chris  Exercises -  Seated Gentle Upper Trapezius Stretch  - 1 x daily - 3 reps - 30 hold - Seated Shoulder Row with Anchored Resistance  - 3 x weekly - 3 sets - 10 reps - Standing Single Arm Shoulder Flexion Towel Slide at Table Top  - 1 x daily - 7 x weekly - 3 sets - 10 reps - Shoulder Abduction Towel Slide at Table Top  - 1 x daily - 7 x weekly - 3 sets - 10 reps - Seated Shoulder Flexion Self PROM  - 1 x daily - 7 x weekly - 3 sets - 10 reps - Shoulder External Rotation and Scapular Retraction with Resistance  - 1 x daily - 3 x weekly - 3 sets - 10 reps - Supine Shoulder Circles with Weight  - 1 x daily - 3 x weekly - 3 sets - 10 reps - Sidelying Shoulder Flexion 15 Degrees  - 1 x daily - 3 x weekly - 3 sets - 10 reps - Isometric Shoulder Extension with Ball at Wall  - 1 x daily - 3 x weekly - 3 sets - 10 reps - Isometric Shoulder Flexion with Ball at [employer]  - 1  x daily - 3 x weekly - 3 sets - 10 reps - Supine Shoulder Abduction AROM  - 1 x daily - 3 x weekly - 3 sets - 10 reps - Supine Scapular Protraction in Flexion with Dumbbells  - 1 x daily - 3 x weekly - 3 sets - 15 reps - Seated Shoulder Flexion AAROM with Pulley Behind  - 1 x daily - 7 x weekly - 3 sets - 10 reps - Seated Shoulder Scaption AAROM with Pulley at Side  - 1 x daily - 7 x weekly - 3 sets - 10 reps - Supine Shoulder External Rotation PROM with Caregiver  - 1 x daily - 3 reps - 5-10 min hold   ASSESSMENT:   CLINICAL IMPRESSION:  Pt presents s/p 10 weeks for RTC repair and subacromial decompression. She continues to show limitations with left shoulder PROM due to pain especially with abduction and external rotation even though she should now be at full PROM at this point in her rehab protocol. Pt has been scheduled to meet with orthopedist about her ongoing limitations in her ROM. PT instructed her to continue her HEP.  She will continue to benefit from skilled therapy to address remaining deficits in order to improve overall QoL and return to  PLOF.   OBJECTIVE IMPAIRMENTS decreased ROM, decreased strength, impaired UE functional use, and pain.    ACTIVITY LIMITATIONS lifting, dressing, reach over head, and hygiene/grooming   PARTICIPATION LIMITATIONS: cleaning, laundry, driving, and shopping   PERSONAL FACTORS  Diabetes mellitus  are also affecting patient's functional outcome.    REHAB POTENTIAL: Good   CLINICAL DECISION MAKING: Stable/uncomplicated   EVALUATION COMPLEXITY: Low     GOALS: Goals reviewed with patient? No   SHORT TERM GOALS: Target date: 09/14/2021  (Remove Blue Hyperlink)   Pt will be independent with HEP in order to improve strength and balance in order to decrease fall risk and improve function at home and work. Baseline: NT  Goal status: Ongoing    2.  Patient will be able to remove left arm from sling for increased mobility and as a sign of healing.  Baseline:  Currently wearing sling Goal status: Ongoing    3.  Patient will achieve full PROM of left shoulder as a sign of successful healing and to progress to week 4-10 of protocol.  Baseline: Shoulder Flex R/L 180/ PROM70, Shoulder Abd R/L 180/ PROM 80, Shoulder ER R/L 90/ PROM 30 Goal status: Ongoing        LONG TERM GOALS: Target date: 11/09/2021  (Remove Blue Hyperlink)   Patient will have improved function and activity level as evidenced by an increase in FOTO score by 10 points or more.  Baseline: 23/100 with target of 100 Goal status: Ongoing    2.  Patient will exhibit symmetrical AROM between right and left shoulder to perform reaching, lifting and overhead tasks to carry out ADLs. Baseline: Shoulder Flex R/L 180/ PROM70, Shoulder Abd R/L 180/ PROM 80, Shoulder ER R/L 90/ PROM 30  10/10/21: Shoulder Flex R/L 120/30 (120 on LUE with PROM), Shoulder Abduction AROM R/L 120/80,  Shoulder ER AROM R/L 90/40 Goal status: Ongoing    3.  Patient will exhibit symmetrical shoulder strength between right and left shoulder perform reaching,  lifting and overhead tasks to carry out ADLs. Baseline: Unable to test left shoulder  Goal status: Ongoing      PLAN: PT FREQUENCY: 2x/week   PT DURATION: 10 weeks   PLANNED INTERVENTIONS:  Therapeutic exercises, Neuromuscular re-education, Patient/Family education, Self Care, Joint mobilization, Joint manipulation, DME instructions, Dry Needling, Electrical stimulation, Cryotherapy, Moist heat, scar mobilization, Manual therapy, and Re-evaluation   PLAN FOR NEXT SESSION:  Progress AROM and shoulder and parascapular strengthening exercises within pain free ROM.    Bradly Chris PT, DPT 10/24/21, 2:13 PM

## 2021-10-25 ENCOUNTER — Encounter: Payer: Self-pay | Admitting: Orthopaedic Surgery

## 2021-10-26 ENCOUNTER — Ambulatory Visit: Payer: Managed Care, Other (non HMO) | Admitting: Physical Therapy

## 2021-10-26 ENCOUNTER — Encounter: Payer: Self-pay | Admitting: Physical Therapy

## 2021-10-26 ENCOUNTER — Ambulatory Visit (INDEPENDENT_AMBULATORY_CARE_PROVIDER_SITE_OTHER): Payer: Managed Care, Other (non HMO) | Admitting: Orthopaedic Surgery

## 2021-10-26 ENCOUNTER — Encounter: Payer: Self-pay | Admitting: Orthopaedic Surgery

## 2021-10-26 DIAGNOSIS — Z9889 Other specified postprocedural states: Secondary | ICD-10-CM | POA: Diagnosis not present

## 2021-10-26 DIAGNOSIS — M79622 Pain in left upper arm: Secondary | ICD-10-CM

## 2021-10-26 MED ORDER — ACETAMINOPHEN-CODEINE 300-30 MG PO TABS: 1.0000 | ORAL_TABLET | Freq: Every day | ORAL | 2 refills | Status: DC | PRN

## 2021-10-26 NOTE — Progress Notes (Signed)
Post-Op Visit Note   Patient: Tina Stephenson           Date of Birth: March 24, 1955           MRN: [id number] Visit Date: 10/26/2021 PCP: Delsa Grana, PA-C   Assessment & Plan:  Chief Complaint:  Chief Complaint  Patient presents with   Left Shoulder - Follow-up    Left shoulder scope 08/18/2021   Visit Diagnoses:  1. S/P left rotator cuff repair     Plan: Adisyn returns today 8 weeks status post left rotator cuff repair.  She is having some difficulty with regaining range of motion.  She also feels pain with lowering her arm.  She requests a refill on Tylenol 3.  Examination left shoulder shows fully healed surgical scars.  Forward flexion to about 120 degrees with pain.  Isolated abduction to 85 degrees with pain.  External rotation to 35 degrees with pain.  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  The same thing happened with her right shoulder around this time as well.  We will order a glenohumeral injection presents looks soon as possible.  She can resume PT once her symptoms feel better.  I would like to recheck her in 6 weeks.  Follow-Up Instructions: Return in about 6 weeks (around 12/07/2021).   Orders:  No orders of the defined types were placed in this encounter.  Meds ordered this encounter  Medications   acetaminophen-codeine (TYLENOL #3) 300-30 MG tablet    Sig: Take 1-2 tablets by mouth daily as needed for moderate pain.    Dispense:  60 tablet    Refill:  2    Imaging: No results found.  PMFS History: Patient Active Problem List   Diagnosis Date Noted   Nontraumatic complete tear of left rotator cuff 08/15/2021   Nontraumatic complete tear of right rotator cuff 07/29/2021   Upper respiratory infection, acute 12/20/2020   Bruised toe 12/20/2020   Traumatic tear of supraspinatus tendon of right shoulder 09/24/2020   Tendinopathy of right biceps tendon 09/24/2020   Traumatic tear of supraspinatus tendon of  left shoulder 09/24/2020   Leg swelling 08/02/2020   Dyslipidemia 01/05/2020   Cervical polyp 08/22/2018   Nocturia 08/22/2018   Intertrigo 08/22/2018   Type 2 diabetes mellitus without complication, without long-term current use of insulin ([place]) 08/22/2018   Cervical spine arthritis with nerve pain 06/25/2018   Herniated disc, cervical 06/25/2018   History of migraine 03/25/2018   Scoliosis 03/25/2018   History of cyst of breast 03/25/2018   Sacrum and coccyx fracture, sequela 02/21/2018   Class 3 severe obesity due to excess calories without serious comorbidity with body mass index (BMI) of 45.0 to 49.9 in adult ([place]) 02/21/2018   Onychomycosis 02/21/2018   History of hypertension 02/21/2018   Vitamin D deficiency 03/19/2015   Essential hypertension 03/05/2015   Past Medical History:  Diagnosis Date   Bronchitis    Depression    Diabetes mellitus without complication ([facility])    Gallbladder polyp    GERD (gastroesophageal reflux disease)    Hypertension    Hypertensive disorder 03/05/2015   Liver tumor (benign)    Sacrum and coccyx fracture ([place])    Sciatica     Family History  Problem Relation Age of Onset   COPD Mother    Cancer Mother    Heart disease Father    Breast cancer Paternal Aunt    Diabetes Brother  Diabetes Maternal Grandfather    Ovarian cancer Neg Hx    Colon cancer Neg Hx     Past Surgical History:  Procedure Laterality Date   APPENDECTOMY     NASAL SEPTUM SURGERY     WISDOM TOOTH EXTRACTION     Social History   Occupational History   Not on file  Tobacco Use   Smoking status: Never   Smokeless tobacco: Never  Vaping Use   Vaping Use: Never used  Substance and Sexual Activity   Alcohol use: Never   Drug use: Never   Sexual activity: Not Currently    Birth control/protection: None

## 2021-10-26 NOTE — Therapy (Signed)
OUTPATIENT PHYSICAL THERAPY TREATMENT  NOTE   Patient Name: Tina Stephenson MRN: [id number] DOB:16-Aug-1955, 66 y.o., female Today's Date: 10/26/2021  PCP: Delsa Grana PA-C  REFERRING PROVIDER: Dwana Melena PA-C  END OF SESSION:   PT End of Session - 10/26/21 1419     Visit Number 15    Number of Visits 20    Date for PT Re-Evaluation 11/09/21    Authorization Type [employer] [id number]    Authorization Time Period 08/30/21-11/09/21    Authorization - Visit Number 15    Authorization - Number of Visits 40    Progress Note Due on Visit 98    PT Start Time 0109    PT Stop Time 1500    PT Time Calculation (min) 45 min    Activity Tolerance Patient limited by pain    Behavior During Therapy [facility] for tasks assessed/performed               Past Medical History:  Diagnosis Date   Bronchitis    Depression    Diabetes mellitus without complication ([place])    Gallbladder polyp    GERD (gastroesophageal reflux disease)    Hypertension    Hypertensive disorder 03/05/2015   Liver tumor (benign)    Sacrum and coccyx fracture ([place])    Sciatica    Past Surgical History:  Procedure Laterality Date   APPENDECTOMY     NASAL SEPTUM SURGERY     WISDOM TOOTH EXTRACTION     Patient Active Problem List   Diagnosis Date Noted   Nontraumatic complete tear of left rotator cuff 08/15/2021   Nontraumatic complete tear of right rotator cuff 07/29/2021   Upper respiratory infection, acute 12/20/2020   Bruised toe 12/20/2020   Traumatic tear of supraspinatus tendon of right shoulder 09/24/2020   Tendinopathy of right biceps tendon 09/24/2020   Traumatic tear of supraspinatus tendon of left shoulder 09/24/2020   Leg swelling 08/02/2020   Dyslipidemia 01/05/2020   Cervical polyp 08/22/2018   Nocturia 08/22/2018   Intertrigo 08/22/2018   Type 2 diabetes mellitus without complication, without long-term current use of insulin ([place]) 08/22/2018   Cervical spine arthritis with nerve pain 06/25/2018    Herniated disc, cervical 06/25/2018   History of migraine 03/25/2018   Scoliosis 03/25/2018   History of cyst of breast 03/25/2018   Sacrum and coccyx fracture, sequela 02/21/2018   Class 3 severe obesity due to excess calories without serious comorbidity with body mass index (BMI) of 45.0 to 49.9 in adult ([place]) 02/21/2018   Onychomycosis 02/21/2018   History of hypertension 02/21/2018   Vitamin D deficiency 03/19/2015   Essential hypertension 03/05/2015    REFERRING DIAG: Z98.890 (ICD-10-CM) - H/O repair of rotator cuff  THERAPY DIAG:  S/P left rotator cuff repair  Pain in left upper arm  Rationale for Evaluation and Treatment Rehabilitation  PERTINENT HISTORY: S/p left rtc repair and subacrominal decompression on 08/18/21. H/o same surgery on right shoulder several years ago with successful recovery. She has been icing repeatedly throughout the day for pain and swelling management.   PRECAUTIONS:   SUBJECTIVE: Pt reports that she was informed by orthopedist that she has a partially frozen left shoulder and that she will be receiving an injection on Monday next week for pain. She experienced significant pain with performing D2 flexion in supine.    PAIN:  2-3/10 in left anterior shoulder; with pain medicine prior to arrival    OBJECTIVE: (objective measures completed at initial evaluation unless otherwise dated)  VITALS: BP 125/67 HR 80 SpO2 100     DIAGNOSTIC FINDINGS:  CLINICAL DATA:  Shoulder pain   EXAM: RIGHT SHOULDER - 3 VIEW   COMPARISON:  None.   FINDINGS: There is no evidence of fracture or dislocation. Mild degenerative changes of the acromioclavicular joint. Bone anchors of the proximal humerus. Soft tissues are unremarkable.   IMPRESSION: No acute osseous abnormality.     Electronically Signed   By: Yetta Glassman M.D.   On: 02/15/2021 12:15     PATIENT SURVEYS:  FOTO 25/100 with target of 53    COGNITION: Overall cognitive status: Within  functional limits for tasks assessed                                  SENSATION: WFL   POSTURE: Normal posture    UPPER EXTREMITY ROM:      Cervical: Not performed during eval AROM  Flex    45 Ext      45  Lat Side Bend R/L 45/45 Rotation       R/L     60/60       Active ROM Right eval Left Eval/ All PROM  Shoulder flexion 180 70*  Shoulder extension      Shoulder abduction 180  80*  Shoulder adduction      Shoulder internal rotation 70 70  Shoulder external rotation 90 30*  Elbow flexion 150 150  Elbow extension 60 60  Wrist flexion 80 80  Wrist extension 70 70  Wrist ulnar deviation 30 30  Wrist radial deviation 20 20  Wrist pronation      Wrist supination      (Blank rows = not tested)             UPPER EXTREMITY MMT:   MMT Left  10/05/21  Right eval  Shoulder flexion 3-     Shoulder extension      Shoulder abduction  3+    Shoulder adduction      Shoulder internal rotation  3    Shoulder external rotation 3+     Middle trapezius      Lower trapezius      Elbow flexion      Elbow extension      Wrist flexion  4+  Wrist extension   4+  Wrist ulnar deviation   4+  Wrist radial deviation   4+  Wrist pronation      Wrist supination      Grip strength (lbs)   Fair   (Blank rows = not tested)        JOINT MOBILITY TESTING:  Not performed    PALPATION:  Not performed              TODAY'S TREATMENT:   10/26/21:  Shoulder Pulleys AAROM Flex/ Ext  with RUE active only and LUE PROM 3 x 10  Shoulder Pulleys AAROM Abd/ Add  with RUE active only and LUE PROM 3 x 10  Shoulder Flexion AAROM with #3 AW 3 x 10  Ranger Shoulder Flexion on LLE to 90 deg 1 x 5  -Pt needs to stop due to pain  Ranger Shoulder Abduction on LLE to 90 deg 1 x 5  -Pt needs to stop due to pain   MANUAL   Right side lying lat stretch and massage  -Rotation of pelvis counter to shoulder Prone Chest and Lat Stretch seated  with head on table 3 x 30 sec  -min VC for hand  and head placement  Upper trap trigger point release and massage   10/24/21:  Shoulder Pulleys AAROM Flex/ Ext  with RUE active only and LUE PROM 3 x 10  Shoulder Pulleys AAROM Abd/ Add  with RUE active only and LUE PROM 3 x 10  Supine Shoulder Flexion AAROM wand  between 120 with 2.5 lbs 3 x 10  -NPS 3 to 4 /10  Supine Shoulder Left D2 Flexion to 0 degree flexion 1 x 10  -NPS 8/10, pt self terminated  Right Side Lying Left Shoulder Flexion to 120 deg 1 x 10  Right Side Lying Left Shoulder Flexion to 120 deg with #1 DB 1 x 10  Side Lying Lat Stretch with PT providing over pressure over left hip 3 x 30 sec  Supine Pec Stretch with half roll underneath 30 sec  -Pt experiences too much pain to extend left elbow    10/19/21:   THEREX:   Shoulder Pulleys AAROM Flex/ Ext  with RUE active only and LUE PROM 3 x 10  Shoulder Pulleys AAROM Abd/ Add  with RUE active only and LUE PROM 3 x 10  Supine Shoulder Flexion AAROM wand  between 95  with 2.5 lbs 3 x 10  Supine Shoulder Left Abduction AAROM wand between 0 to 75 3 x 10   MANUAL   Shoulder Flexion left to 120 degrees with heat pack  Shoulder abduction left to 90 degrees with heat pack MFR of left upper trap  MFR of right rhomboid     10/17/21:  Shoulder Pulleys AAROM Flex/ Ext  with RUE active only and LUE PROM 3 x 10  Shoulder Pulleys AAROM Abd/ Add  with RUE active only and LUE PROM 3 x 10  Supine Shoulder AAROM Flexion/Ext with 2.5 AW with PVC wand 1 x 10  -Pt reports an increase in NPS to 5-6/10  Supine Shoulder AAROM Flexion/Ext with PVC wand 2 x 10  Supine Shoulder AAROM Abduction/Adduction with PVC 1 x 10  Supine Shoulder ER at 45 deg abduction with 2 lbs DB 5 min hold    Manual Therapy   Grade III Inferior Glide  on LUE x 30   10/12/21   Shoulder Pulleys AAROM Flex/ Ext  with RUE active only and LUE PROM 3 x 10  Shoulder Pulleys AAROM Abd/ Add  with RUE active only and LUE PROM 3 x 10  OMEGA Seated Rows with #5 1  x 10  Left Shoulder Abduction AROM [employer] 1 x 10  Left Shoulder Flexion AROM White Ball Rolls 1 x 10   Supine Shoulder AAROM Flexion to 90 degrees with PVC pipe 3 x 10  Supine Left  Shoulder Circles Clockwise 3 x 10  Supine Left Shoulder Circles Counter Clockwise 3 x 10  Serratus Punches 3 x 15    PATIENT EDUCATION: Education details: form and technique for appropriate exercise and explanation of plan of care.  Person educated: Patient Education method: Customer service manager Education comprehension: verbalized understanding and returned demonstration     HOME EXERCISE PROGRAM: Access Code: [email or web address] URL: [email or web address] Date: 10/17/2021 Prepared by: Bradly Chris  Exercises - Seated Gentle Upper Trapezius Stretch  - 1 x daily - 3 reps - 30 hold - Seated Shoulder Row with Anchored Resistance  - 3 x weekly - 3 sets - 10 reps - Standing Single Arm Shoulder Flexion Towel Slide at Table Top  -  1 x daily - 7 x weekly - 3 sets - 10 reps - Shoulder Abduction Towel Slide at Table Top  - 1 x daily - 7 x weekly - 3 sets - 10 reps - Seated Shoulder Flexion Self PROM  - 1 x daily - 7 x weekly - 3 sets - 10 reps - Shoulder External Rotation and Scapular Retraction with Resistance  - 1 x daily - 3 x weekly - 3 sets - 10 reps - Supine Shoulder Circles with Weight  - 1 x daily - 3 x weekly - 3 sets - 10 reps - Sidelying Shoulder Flexion 15 Degrees  - 1 x daily - 3 x weekly - 3 sets - 10 reps - Isometric Shoulder Extension with Ball at [employer]  - 1 x daily - 3 x weekly - 3 sets - 10 reps - Isometric Shoulder Flexion with Ball at [employer]  - 1 x daily - 3 x weekly - 3 sets - 10 reps - Supine Shoulder Abduction AROM  - 1 x daily - 3 x weekly - 3 sets - 10 reps - Supine Scapular Protraction in Flexion with Dumbbells  - 1 x daily - 3 x weekly - 3 sets - 15 reps - Seated Shoulder Flexion AAROM with Pulley Behind  - 1 x daily - 7 x weekly - 3 sets - 10 reps - Seated  Shoulder Scaption AAROM with Pulley at Side  - 1 x daily - 7 x weekly - 3 sets - 10 reps - Supine Shoulder External Rotation PROM with Caregiver  - 1 x daily - 3 reps - 5-10 min hold   ASSESSMENT:   CLINICAL IMPRESSION: Pt presents s/p 10 weeks for RTC repair and subacromial decompression. She continues to experience increased pain that is limiting her left shoulder ROM. She will wait to reschedule appointment after joint injection which she will receive next week. She will continue to benefit from skilled therapy to address remaining deficits in order to improve overall QoL and return to PLOF.    OBJECTIVE IMPAIRMENTS decreased ROM, decreased strength, impaired UE functional use, and pain.    ACTIVITY LIMITATIONS lifting, dressing, reach over head, and hygiene/grooming   PARTICIPATION LIMITATIONS: cleaning, laundry, driving, and shopping   PERSONAL FACTORS  Diabetes mellitus  are also affecting patient's functional outcome.    REHAB POTENTIAL: Good   CLINICAL DECISION MAKING: Stable/uncomplicated   EVALUATION COMPLEXITY: Low     GOALS: Goals reviewed with patient? No   SHORT TERM GOALS: Target date: 09/14/2021     Pt will be independent with HEP in order to improve strength and balance in order to decrease fall risk and improve function at home and work. Baseline:  Doing exercises independently  Goal status: Achieved    2.  Patient will be able to remove left arm from sling for increased mobility and as a sign of healing.  Baseline:  Currently not wearing sling  Goal status: Achieved    3.  Patient will achieve full PROM of left shoulder as a sign of successful healing and to progress to week 4-10 of protocol.  Baseline: Shoulder Flex R/L 180/ PROM70, Shoulder Abd R/L 180/ PROM 80, Shoulder ER R/L 90/ PROM 30 Goal status: Ongoing        LONG TERM GOALS: Target date: 11/09/2021  (Remove Blue Hyperlink)   Patient will have improved function and activity level as evidenced by  an increase in FOTO score by 10 points or more.  Baseline: 23/100  with target of 100 Goal status: Ongoing    2.  Patient will exhibit symmetrical AROM between right and left shoulder to perform reaching, lifting and overhead tasks to carry out ADLs. Baseline: Shoulder Flex R/L 180/ PROM70, Shoulder Abd R/L 180/ PROM 80, Shoulder ER R/L 90/ PROM 30  10/10/21: Shoulder Flex R/L 120/30 (120 on LUE with PROM), Shoulder Abduction AROM R/L 120/80,  Shoulder ER AROM R/L 90/40 Goal status: Ongoing    3.  Patient will exhibit symmetrical shoulder strength between right and left shoulder perform reaching, lifting and overhead tasks to carry out ADLs. Baseline: Unable to test left shoulder  Goal status: Ongoing      PLAN: PT FREQUENCY: 2x/week   PT DURATION: 10 weeks   PLANNED INTERVENTIONS: Therapeutic exercises, Neuromuscular re-education, Patient/Family education, Self Care, Joint mobilization, Joint manipulation, DME instructions, Dry Needling, Electrical stimulation, Cryotherapy, Moist heat, scar mobilization, Manual therapy, and Re-evaluation   PLAN FOR NEXT SESSION:  Progress PROM/AROM and shoulder and parascapular strengthening exercises within pain free ROM.    Bradly Chris PT, DPT 10/26/21, 2:22 PM

## 2021-10-26 NOTE — Telephone Encounter (Signed)
Not sure how we go about doing this other than increasing frequency, and having her not take this amount as her insurance/pharmacy will only approve for a 7 day supply.  Can we look into what we need to do?

## 2021-10-31 ENCOUNTER — Encounter: Payer: Self-pay | Admitting: Sports Medicine

## 2021-10-31 ENCOUNTER — Encounter: Payer: Managed Care, Other (non HMO) | Admitting: Physical Therapy

## 2021-10-31 ENCOUNTER — Ambulatory Visit: Payer: Managed Care, Other (non HMO) | Admitting: Sports Medicine

## 2021-10-31 ENCOUNTER — Ambulatory Visit: Payer: Self-pay

## 2021-10-31 VITALS — BP 144/79 | HR 83

## 2021-10-31 DIAGNOSIS — Z9889 Other specified postprocedural states: Secondary | ICD-10-CM

## 2021-10-31 DIAGNOSIS — M25512 Pain in left shoulder: Secondary | ICD-10-CM

## 2021-10-31 DIAGNOSIS — G8929 Other chronic pain: Secondary | ICD-10-CM

## 2021-10-31 MED ORDER — LIDOCAINE HCL 1 % IJ SOLN
2.0000 mL | INTRAMUSCULAR | Status: AC | PRN
  Administered 2021-10-31: 2 mL

## 2021-10-31 MED ORDER — METHYLPREDNISOLONE ACETATE 40 MG/ML IJ SUSP
40.0000 mg | INTRAMUSCULAR | Status: AC | PRN
  Administered 2021-10-31: 40 mg via INTRA_ARTICULAR

## 2021-10-31 MED ORDER — BUPIVACAINE HCL 0.25 % IJ SOLN
2.0000 mL | INTRAMUSCULAR | Status: AC | PRN
  Administered 2021-10-31: 2 mL via INTRA_ARTICULAR

## 2021-10-31 NOTE — Progress Notes (Signed)
   Procedure Note  Patient: Tina Stephenson             Date of Birth: 1955/08/09           MRN: [id number]             Visit Date: 10/31/2021  Procedures: Visit Diagnoses:  1. Chronic left shoulder pain   2. S/P left rotator cuff repair    Large Joint Inj: L glenohumeral on 10/31/2021 8:51 AM Indications: pain Details: 22 G 3.5 in needle, ultrasound-guided posterior approach Medications: 2 mL lidocaine 1 %; 2 mL bupivacaine 0.25 %; 40 mg methylPREDNISolone acetate 40 MG/ML Outcome: tolerated well, no immediate complications  US-guided glenohumeral joint injection, Left shoulder After discussion on risks/benefits/indications, informed verbal consent was obtained. A timeout was then performed. The patient was positioned lying lateral recumbent on examination table. The patient's shoulder was prepped with betadine and multiple alcohol swabs and utilizing ultrasound guidance, the patient's glenohumeral joint was identified on ultrasound. Using ultrasound guidance a 22-gauge, 3.5 inch needle with a mixture of 2:2:1 cc's lidocaine:bupivicaine:depomedrol was directed from a lateral to medial direction via in-plane technique into the glenohumeral joint with visualization of appropriate spread of injectate into the joint. Patient tolerated the procedure well without immediate complications.      Procedure, treatment alternatives, risks and benefits explained, specific risks discussed. Consent was given by the patient. Immediately prior to procedure a time out was called to verify the correct patient, procedure, equipment, support staff and site/side marked as required. Patient was prepped and draped in the usual sterile fashion.     - I evaluated the patient about 10 minutes post-injection and she had definite improvement in pain and range of motion -We will see her back in 2 weeks if she does not have at least 90% improvement, we will consider 1 additional injection at that time.  Continue to work  on range of motion exercises for adhesive capsulitis - follow-up with Dr. Erlinda Hong as indicated  Elba Barman, DO [place]  This note was dictated using Dragon naturally speaking software and may contain errors in syntax, spelling, or content which have not been identified prior to signing this note.

## 2021-11-02 ENCOUNTER — Encounter: Payer: Managed Care, Other (non HMO) | Admitting: Physical Therapy

## 2021-11-02 ENCOUNTER — Telehealth: Payer: Self-pay | Admitting: Orthopaedic Surgery

## 2021-11-02 NOTE — Telephone Encounter (Signed)
Disability forms received. Please advise if patient still oow or current work status for [employer]. Thank you

## 2021-11-02 NOTE — Telephone Encounter (Signed)
Out of work 

## 2021-11-05 ENCOUNTER — Other Ambulatory Visit: Payer: Self-pay | Admitting: Family Medicine

## 2021-11-05 DIAGNOSIS — I10 Essential (primary) hypertension: Secondary | ICD-10-CM

## 2021-11-07 ENCOUNTER — Encounter: Payer: Self-pay | Admitting: Family Medicine

## 2021-11-07 ENCOUNTER — Ambulatory Visit: Payer: Managed Care, Other (non HMO) | Admitting: Family Medicine

## 2021-11-07 ENCOUNTER — Encounter: Payer: Self-pay | Admitting: Physical Therapy

## 2021-11-07 ENCOUNTER — Ambulatory Visit: Payer: Managed Care, Other (non HMO) | Admitting: Physical Therapy

## 2021-11-07 VITALS — BP 122/68 | HR 98 | Temp 98.7°F | Resp 16 | Ht 63.0 in | Wt 219.8 lb

## 2021-11-07 DIAGNOSIS — B351 Tinea unguium: Secondary | ICD-10-CM

## 2021-11-07 DIAGNOSIS — E669 Obesity, unspecified: Secondary | ICD-10-CM | POA: Insufficient documentation

## 2021-11-07 DIAGNOSIS — Z23 Encounter for immunization: Secondary | ICD-10-CM | POA: Diagnosis not present

## 2021-11-07 DIAGNOSIS — Z6838 Body mass index (BMI) 38.0-38.9, adult: Secondary | ICD-10-CM

## 2021-11-07 DIAGNOSIS — Z9889 Other specified postprocedural states: Secondary | ICD-10-CM | POA: Diagnosis not present

## 2021-11-07 DIAGNOSIS — E785 Hyperlipidemia, unspecified: Secondary | ICD-10-CM | POA: Diagnosis not present

## 2021-11-07 DIAGNOSIS — M7989 Other specified soft tissue disorders: Secondary | ICD-10-CM

## 2021-11-07 DIAGNOSIS — M545 Low back pain, unspecified: Secondary | ICD-10-CM | POA: Insufficient documentation

## 2021-11-07 DIAGNOSIS — E119 Type 2 diabetes mellitus without complications: Secondary | ICD-10-CM

## 2021-11-07 DIAGNOSIS — M79622 Pain in left upper arm: Secondary | ICD-10-CM

## 2021-11-07 DIAGNOSIS — I10 Essential (primary) hypertension: Secondary | ICD-10-CM

## 2021-11-07 MED ORDER — HYDROCHLOROTHIAZIDE 12.5 MG PO TABS: ORAL_TABLET | ORAL | 3 refills | Status: DC

## 2021-11-07 NOTE — Assessment & Plan Note (Signed)
Good statin compliance w/o SE or concerns Last lipid panel reviewed and at goal w/o recent changes Lipid panel annually -due next April Lab Results  Component Value Date   CHOL 130 05/06/2021   HDL 57 05/06/2021   LDLCALC 52 05/06/2021   TRIG 120 05/06/2021   CHOLHDL 2.3 05/06/2021

## 2021-11-07 NOTE — Progress Notes (Signed)
Name: Tina Stephenson   MRN: [id number]    DOB: 05/29/1955   Date:11/07/2021       Progress Note  Chief Complaint  Patient presents with   Follow-up   Hypertension   Hyperlipidemia   Diabetes     Subjective:   Tina Stephenson is a 66 y.o. female, presents to clinic for routine f/up  Hypertension:  Currently managed on HCTZ Pt reports good med compliance and denies any SE.   Blood pressure today is well controlled. BP Readings from Last 3 Encounters:  11/07/21 122/68  10/31/21 (!) 144/79  09/10/21 (!) 90/49   Pt denies CP, SOB, exertional sx, LE edema, palpitation, Ha's, visual disturbances, lightheadedness, hypotension, syncope. Dietary efforts for BP?  Healthier diet/loosing weight LE edema well controlled on this med Previously on lisinopril but stopped that   DM:   Pt managing DM with metformin 1000 mg BID and diet/lifestyle Denies: Polyuria, polydipsia, vision changes, neuropathy, hypoglycemia Recent pertinent labs: Lab Results  Component Value Date   HGBA1C 5.7 (H) 05/06/2021   HGBA1C 6.4 (H) 09/24/2020   HGBA1C 5.9 (H) 05/07/2020   Lab Results  Component Value Date   LDLCALC 52 05/06/2021   CREATININE 0.64 05/06/2021   Standard of care and health maintenance: Urine Microalbumin:  due Foot exam:  done today  DM eye exam:  scheduled ACEI/ARB: none Statin:  yes  Wants referral for nail fungal disease, tried and failed topical and oral meds  Hyperlipidemia: Currently treated with crestor, pt reports good med compliance Last Lipids: Lab Results  Component Value Date   CHOL 130 05/06/2021   HDL 57 05/06/2021   LDLCALC 52 05/06/2021   TRIG 120 05/06/2021   CHOLHDL 2.3 05/06/2021   - Denies: Chest pain, shortness of breath, myalgias, claudication  Requests obesity and BMI be changed on problem list Wt Readings from Last 5 Encounters:  11/07/21 219 lb 12.8 oz (99.7 kg)  05/24/21 227 lb (103 kg)  05/06/21 230 lb (104.3 kg)  02/16/21 243 lb 12.8 oz  (110.6 kg)  02/15/21 242 lb 15.2 oz (110.2 kg)   BMI Readings from Last 5 Encounters:  11/07/21 38.94 kg/m  05/24/21 40.21 kg/m  05/06/21 40.74 kg/m  02/16/21 43.19 kg/m  02/15/21 43.04 kg/m      Current Outpatient Medications:    acetaminophen (TYLENOL) 500 MG tablet, Take 1,000 mg by mouth 2 (two) times daily., Disp: , Rfl:    acetaminophen-codeine (TYLENOL #3) 300-30 MG tablet, Take 1-2 tablets by mouth daily as needed for moderate pain., Disp: 60 tablet, Rfl: 2   albuterol (PROVENTIL) (2.5 MG/3ML) 0.083% nebulizer solution, Take 3 mLs (2.5 mg total) by nebulization every 6 (six) hours as needed for wheezing or shortness of breath., Disp: 150 mL, Rfl: 3   blood glucose meter kit and supplies KIT, Dispense based on patient and insurance preference. Use once daily as directed. (FOR ICD-10 E11.9)., Disp: 1 each, Rfl: 0   glucose blood test strip, Use as instructed, Disp: 100 each, Rfl: 1   Lancets (ONETOUCH DELICA PLUS [id number]) MISC, USE TO CHECK BLOOD SUGAR TWICE DAILY, Disp: 100 each, Rfl: 5   metFORMIN (GLUCOPHAGE) 500 MG tablet, TAKE 2 TABLETS(1000 MG) BY MOUTH TWICE DAILY WITH A MEAL, Disp: 360 tablet, Rfl: 3   Multiple Vitamins-Minerals (MULTIVITAMIN WITH MINERALS) tablet, Take 1 tablet by mouth daily., Disp: , Rfl:    rosuvastatin (CRESTOR) 5 MG tablet, Take 1 tablet (5 mg total) by mouth daily., Disp: 90 tablet, Rfl: 3  SUMAtriptan (IMITREX) 25 MG tablet, Take 25-50 mg po at migraine onset, and may repeat dose in 2 hours as needed for unresolved HA, Maximum daily dose 200 mg in 24 hours, Disp: 20 tablet, Rfl: 3   hydrochlorothiazide (HYDRODIURIL) 12.5 MG tablet, TAKE 1 TABLET(12.5 MG) BY MOUTH DAILY, Disp: 90 tablet, Rfl: 3  Patient Active Problem List   Diagnosis Date Noted   Nontraumatic complete tear of left rotator cuff 08/15/2021   Nontraumatic complete tear of right rotator cuff 07/29/2021   Upper respiratory infection, acute 12/20/2020   Bruised toe 12/20/2020    Traumatic tear of supraspinatus tendon of right shoulder 09/24/2020   Tendinopathy of right biceps tendon 09/24/2020   Traumatic tear of supraspinatus tendon of left shoulder 09/24/2020   Leg swelling 08/02/2020   Dyslipidemia 01/05/2020   Cervical polyp 08/22/2018   Nocturia 08/22/2018   Intertrigo 08/22/2018   Type 2 diabetes mellitus without complication, without long-term current use of insulin ([place]) 08/22/2018   Cervical spine arthritis with nerve pain 06/25/2018   Herniated disc, cervical 06/25/2018   History of migraine 03/25/2018   Scoliosis 03/25/2018   History of cyst of breast 03/25/2018   Sacrum and coccyx fracture, sequela 02/21/2018   Class 3 severe obesity due to excess calories without serious comorbidity with body mass index (BMI) of 45.0 to 49.9 in adult ([place]) 02/21/2018   Onychomycosis 02/21/2018   History of hypertension 02/21/2018   Vitamin D deficiency 03/19/2015   Essential hypertension 03/05/2015    Past Surgical History:  Procedure Laterality Date   APPENDECTOMY     NASAL SEPTUM SURGERY     VASECTOMY     WISDOM TOOTH EXTRACTION      Family History  Problem Relation Age of Onset   COPD Mother    Cancer Mother    Heart disease Father    Breast cancer Paternal Aunt    Diabetes Brother    Diabetes Maternal Grandfather    Ovarian cancer Neg Hx    Colon cancer Neg Hx     Social History   Tobacco Use   Smoking status: Never   Smokeless tobacco: Never  Vaping Use   Vaping Use: Never used  Substance Use Topics   Alcohol use: Never   Drug use: Never     Allergies  Allergen Reactions   Penicillins Other (See Comments)    "lose my hearing"    Health Maintenance  Topic Date Due   Diabetic kidney evaluation - Urine ACR  Never done   FOOT EXAM  09/24/2021   HEMOGLOBIN A1C  11/05/2021   OPHTHALMOLOGY EXAM  11/16/2021   MAMMOGRAM  01/12/2022   Diabetic kidney evaluation - GFR measurement  05/07/2022   COLONOSCOPY (Pts 45-27yr Insurance  coverage will need to be confirmed)  12/07/2025   TETANUS/TDAP  08/21/2028   Hepatitis C Screening  Completed   HIV Screening  Completed   HPV VACCINES  Aged Out   INFLUENZA VACCINE  Discontinued   COVID-19 Vaccine  Discontinued   Zoster Vaccines- Shingrix  Discontinued    Chart Review Today: I personally reviewed active problem list, medication list, allergies, family history, social history, health maintenance, notes from last encounter, lab results, imaging with the patient/caregiver today.   Review of Systems  Constitutional: Negative.   HENT: Negative.    Eyes: Negative.   Respiratory: Negative.    Cardiovascular: Negative.   Gastrointestinal: Negative.   Endocrine: Negative.   Genitourinary: Negative.   Musculoskeletal: Negative.   Skin: Negative.  Allergic/Immunologic: Negative.   Neurological: Negative.   Hematological: Negative.   Psychiatric/Behavioral: Negative.    All other systems reviewed and are negative.    Objective:   Vitals:   11/07/21 1533  BP: 122/68  Pulse: 98  Resp: 16  Temp: 98.7 F (37.1 C)  TempSrc: Oral  SpO2: 96%  Weight: 219 lb 12.8 oz (99.7 kg)  Height: 5' 3" (1.6 m)    Body mass index is 38.94 kg/m.  Physical Exam Vitals and nursing note reviewed.  Constitutional:      Appearance: Normal appearance. She is well-developed and well-groomed. She is obese. She is not ill-appearing, toxic-appearing or diaphoretic.  HENT:     Head: Normocephalic and atraumatic.     Nose: Nose normal.  Eyes:     General: No scleral icterus.       Right eye: No discharge.        Left eye: No discharge.     Conjunctiva/sclera: Conjunctivae normal.  Neck:     Trachea: No tracheal deviation.  Cardiovascular:     Rate and Rhythm: Normal rate and regular rhythm.     Pulses: Normal pulses.     Heart sounds: Normal heart sounds. No murmur heard.    No friction rub. No gallop.  Pulmonary:     Effort: Pulmonary effort is normal. No respiratory  distress.     Breath sounds: Normal breath sounds. No stridor.  Musculoskeletal:     Right lower leg: No edema.     Left lower leg: No edema.  Skin:    General: Skin is warm and dry.     Findings: No rash.  Neurological:     Mental Status: She is alert. Mental status is at baseline.     Motor: No abnormal muscle tone.     Coordination: Coordination normal.  Psychiatric:        Behavior: Behavior normal. Behavior is cooperative.     Diabetic Foot Exam - Simple   Simple Foot Form Diabetic Foot exam was performed with the following findings: Yes 11/07/2021  3:45 PM  Visual Inspection No deformities, no ulcerations, no other skin breakdown bilaterally: Yes Sensation Testing Intact to touch and monofilament testing bilaterally: Yes Pulse Check Posterior Tibialis and Dorsalis pulse intact bilaterally: Yes Comments Thickened raised brittle nails - bilateral great toe         Assessment & Plan:   Problem List Items Addressed This Visit       Cardiovascular and Mediastinum   Essential hypertension - Primary    Stable, BP well controlled and at goal today on HCTZ 12.5 mg daily Also helps with her mild LE edema She should be on ACEI/ARB for renal protection - will see if willing to restart lower dose lisinopril Recent labs reviewed She is going to send in copy of labs from [facility]       Relevant Medications   hydrochlorothiazide (HYDRODIURIL) 12.5 MG tablet     Endocrine   Type 2 diabetes mellitus without complication, without long-term current use of insulin ([facility])    Well controlled and at goal with diet/lifestyle efforts and metformin 1000 mg BID Lab Results  Component Value Date   HGBA1C 5.7 (H) 05/06/2021   HGBA1C 6.4 (H) 09/24/2020   HGBA1C 5.9 (H) 05/07/2020    Standard of care and health maintenance: Urine Microalbumin:  Ordered today Foot exam:  Done today DM eye exam:  scheduled ACEI/ARB:  NEEDS Statin:  yes  Relevant  Orders   Urine Microalbumin w/creat. ratio   Ambulatory referral to Podiatry     Musculoskeletal and Integument   Onychomycosis    Failed topical and oral tx, pt asks for podiatry referral today      Relevant Orders   Ambulatory referral to Podiatry     Other   Dyslipidemia    Good statin compliance w/o SE or concerns Last lipid panel reviewed and at goal w/o recent changes Lipid panel annually -due next April Lab Results  Component Value Date   CHOL 130 05/06/2021   HDL 57 05/06/2021   LDLCALC 52 05/06/2021   TRIG 120 05/06/2021   CHOLHDL 2.3 05/06/2021        Leg swelling    Stable sx, well controlled with diet/lifestyle efforts and HCTZ      Class 2 obesity with body mass index (BMI) of 38.0 to 38.9 in adult    Loosing weight with sig diet/lifestyle efforts Wt Readings from Last 5 Encounters:  11/07/21 219 lb 12.8 oz (99.7 kg)  05/24/21 227 lb (103 kg)  05/06/21 230 lb (104.3 kg)  02/16/21 243 lb 12.8 oz (110.6 kg)  02/15/21 242 lb 15.2 oz (110.2 kg)  associated HTN, HLD, DM all well controlled         Other Visit Diagnoses     Need for influenza vaccination       refused        Return in about 6 months (around 05/09/2022) for Annual Physical.   Delsa Grana, PA-C 11/07/21 3:44 PM

## 2021-11-07 NOTE — Assessment & Plan Note (Signed)
Well controlled and at goal with diet/lifestyle efforts and metformin 1000 mg BID Lab Results  Component Value Date   HGBA1C 5.7 (H) 05/06/2021   HGBA1C 6.4 (H) 09/24/2020   HGBA1C 5.9 (H) 05/07/2020    Standard of care and health maintenance: Urine Microalbumin:  Ordered today Foot exam:  Done today DM eye exam:  scheduled ACEI/ARB:  NEEDS Statin:  yes

## 2021-11-07 NOTE — Therapy (Signed)
OUTPATIENT PHYSICAL THERAPY TREATMENT  NOTE   Patient Name: Tina Stephenson MRN: [id number] DOB:[birth year], 66 y.o., female Today's Date: 11/07/2021  PCP: Delsa Grana PA-C  REFERRING PROVIDER: Dwana Melena PA-C  END OF SESSION:   PT End of Session - 11/07/21 1328     Visit Number 16    Number of Visits 20    Date for PT Re-Evaluation 11/09/21    Authorization Type [employer] 2023    Authorization Time Period 08/30/21-11/09/21    Authorization - Visit Number 52    Authorization - Number of Visits 40    Progress Note Due on Visit 21    PT Start Time 1325    PT Stop Time 1410    PT Time Calculation (min) 45 min    Activity Tolerance Patient tolerated treatment well    Behavior During Therapy [facility] for tasks assessed/performed               Past Medical History:  Diagnosis Date   Bronchitis    Depression    Diabetes mellitus without complication ([place])    Gallbladder polyp    GERD (gastroesophageal reflux disease)    Hypertension    Hypertensive disorder 03/05/2015   Liver tumor (benign)    Sacrum and coccyx fracture ([place])    Sciatica    Past Surgical History:  Procedure Laterality Date   APPENDECTOMY     NASAL SEPTUM SURGERY     VASECTOMY     WISDOM TOOTH EXTRACTION     Patient Active Problem List   Diagnosis Date Noted   Nontraumatic complete tear of left rotator cuff 08/15/2021   Nontraumatic complete tear of right rotator cuff 07/29/2021   Upper respiratory infection, acute 12/20/2020   Bruised toe 12/20/2020   Traumatic tear of supraspinatus tendon of right shoulder 09/24/2020   Tendinopathy of right biceps tendon 09/24/2020   Traumatic tear of supraspinatus tendon of left shoulder 09/24/2020   Leg swelling 08/02/2020   Dyslipidemia 01/05/2020   Cervical polyp 08/22/2018   Nocturia 08/22/2018   Intertrigo 08/22/2018   Type 2 diabetes mellitus without complication, without long-term current use of insulin ([place]) 08/22/2018   Cervical spine arthritis with nerve  pain 06/25/2018   Herniated disc, cervical 06/25/2018   History of migraine 03/25/2018   Scoliosis 03/25/2018   History of cyst of breast 03/25/2018   Sacrum and coccyx fracture, sequela 02/21/2018   Class 3 severe obesity due to excess calories without serious comorbidity with body mass index (BMI) of 45.0 to 49.9 in adult ([place]) 02/21/2018   Onychomycosis 02/21/2018   History of hypertension 02/21/2018   Vitamin D deficiency 03/19/2015   Essential hypertension 03/05/2015    REFERRING DIAG: Z98.890 (ICD-10-CM) - H/O repair of rotator cuff  THERAPY DIAG:  Pain in left upper arm  S/P left rotator cuff repair  Rationale for Evaluation and Treatment Rehabilitation  PERTINENT HISTORY: S/p left rtc repair and subacrominal decompression on 08/18/21. H/o same surgery on right shoulder several years ago with successful recovery. She has been icing repeatedly throughout the day for pain and swelling management.   PRECAUTIONS:   SUBJECTIVE: Pt reports a significant improvement in left shoulder pain since receiving injection. She now has near full ROM and when reaching terminal ROM she feels increased muscular tension but no pain.    PAIN:  2-3/10 in left anterior shoulder; with pain medicine prior to arrival    OBJECTIVE: (objective measures completed at initial evaluation unless otherwise dated)  VITALS: BP 125/67 HR  80 SpO2 100     DIAGNOSTIC FINDINGS:  CLINICAL DATA:  Shoulder pain   EXAM: RIGHT SHOULDER - 3 VIEW   COMPARISON:  None.   FINDINGS: There is no evidence of fracture or dislocation. Mild degenerative changes of the acromioclavicular joint. Bone anchors of the proximal humerus. Soft tissues are unremarkable.   IMPRESSION: No acute osseous abnormality.     Electronically Signed   By: Yetta Glassman M.D.   On: 02/15/2021 12:15     PATIENT SURVEYS:  FOTO 25/100 with target of 53    COGNITION: Overall cognitive status: Within functional limits for tasks  assessed                                  SENSATION: WFL   POSTURE: Normal posture    UPPER EXTREMITY ROM:      Cervical: Not performed during eval AROM  Flex    45 Ext      45  Lat Side Bend R/L 45/45 Rotation       R/L     60/60       Active ROM Right eval Left Eval/ All PROM  Shoulder flexion 180 70*  Shoulder extension      Shoulder abduction 180  80*  Shoulder adduction      Shoulder internal rotation 70 70  Shoulder external rotation 90 30*  Elbow flexion 150 150  Elbow extension 60 60  Wrist flexion 80 80  Wrist extension 70 70  Wrist ulnar deviation 30 30  Wrist radial deviation 20 20  Wrist pronation      Wrist supination      (Blank rows = not tested)             UPPER EXTREMITY MMT:   MMT Left  10/05/21  Right eval  Shoulder flexion 3-     Shoulder extension      Shoulder abduction  3+    Shoulder adduction      Shoulder internal rotation  3    Shoulder external rotation 3+     Middle trapezius      Lower trapezius      Elbow flexion      Elbow extension      Wrist flexion  4+  Wrist extension   4+  Wrist ulnar deviation   4+  Wrist radial deviation   4+  Wrist pronation      Wrist supination      Grip strength (lbs)   Fair   (Blank rows = not tested)        JOINT MOBILITY TESTING:  Not performed    PALPATION:  Not performed              TODAY'S TREATMENT:   11/07/21:  UBE with 0 resistance 2.5 min forward and 2.5 min backward  Internal rotation stretch of LLE 3 x 30 sec  Supine Shoulder Flexion AAROM #3 AW 3 x 10  Supine Shoulder Abduction AAROM on LUE 1 x 5 -Pt reports increased pain to point where she needed to stop   Pulleys AAROM Shoulder Abduction on LUE 3 x 10   Shoulder AROM:  -Flexion R/L 160/160  -Abduction R/L 160/120   Shoulder PROM: -Abduction L 160 with increased pain   Shoulder Flexion AROM on LUE 1 x 10  Shoulder Flexion AROM on LUE #1 DB 1 x 10    10/26/21:  Shoulder  Pulleys AAROM Flex/ Ext   with RUE active only and LUE PROM 3 x 10  Shoulder Pulleys AAROM Abd/ Add  with RUE active only and LUE PROM 3 x 10  Shoulder Flexion AAROM with #3 AW 3 x 10  Ranger Shoulder Flexion on LLE to 90 deg 1 x 5  -Pt needs to stop due to pain  Ranger Shoulder Abduction on LLE to 90 deg 1 x 5  -Pt needs to stop due to pain   MANUAL   Right side lying lat stretch and massage  -Rotation of pelvis counter to shoulder Prone Chest and Lat Stretch seated with head on table 3 x 30 sec  -min VC for hand and head placement  Upper trap trigger point release and massage   10/24/21:  Shoulder Pulleys AAROM Flex/ Ext  with RUE active only and LUE PROM 3 x 10  Shoulder Pulleys AAROM Abd/ Add  with RUE active only and LUE PROM 3 x 10  Supine Shoulder Flexion AAROM wand  between 120 with 2.5 lbs 3 x 10  -NPS 3 to 4 /10  Supine Shoulder Left D2 Flexion to 0 degree flexion 1 x 10  -NPS 8/10, pt self terminated  Right Side Lying Left Shoulder Flexion to 120 deg 1 x 10  Right Side Lying Left Shoulder Flexion to 120 deg with #1 DB 1 x 10  Side Lying Lat Stretch with PT providing over pressure over left hip 3 x 30 sec  Supine Pec Stretch with half roll underneath 30 sec  -Pt experiences too much pain to extend left elbow    10/19/21:   THEREX:   Shoulder Pulleys AAROM Flex/ Ext  with RUE active only and LUE PROM 3 x 10  Shoulder Pulleys AAROM Abd/ Add  with RUE active only and LUE PROM 3 x 10  Supine Shoulder Flexion AAROM wand  between 95  with 2.5 lbs 3 x 10  Supine Shoulder Left Abduction AAROM wand between 0 to 75 3 x 10   MANUAL   Shoulder Flexion left to 120 degrees with heat pack  Shoulder abduction left to 90 degrees with heat pack MFR of left upper trap  MFR of right rhomboid     10/17/21:  Shoulder Pulleys AAROM Flex/ Ext  with RUE active only and LUE PROM 3 x 10  Shoulder Pulleys AAROM Abd/ Add  with RUE active only and LUE PROM 3 x 10  Supine Shoulder AAROM Flexion/Ext with 2.5 AW  with PVC wand 1 x 10  -Pt reports an increase in NPS to 5-6/10  Supine Shoulder AAROM Flexion/Ext with PVC wand 2 x 10  Supine Shoulder AAROM Abduction/Adduction with PVC 1 x 10  Supine Shoulder ER at 45 deg abduction with 2 lbs DB 5 min hold    Manual Therapy   Grade III Inferior Glide  on LUE x 30   10/12/21   Shoulder Pulleys AAROM Flex/ Ext  with RUE active only and LUE PROM 3 x 10  Shoulder Pulleys AAROM Abd/ Add  with RUE active only and LUE PROM 3 x 10  OMEGA Seated Rows with #5 1 x 10  Left Shoulder Abduction AROM [employer] 1 x 10  Left Shoulder Flexion AROM White Ball Rolls 1 x 10   Supine Shoulder AAROM Flexion to 90 degrees with PVC pipe 3 x 10  Supine Left  Shoulder Circles Clockwise 3 x 10  Supine Left Shoulder Circles Counter Clockwise 3 x 10  Serratus Punches 3 x 15    PATIENT EDUCATION: Education details: form and technique for appropriate exercise and explanation of plan of care.  Person educated: Patient Education method: Customer service manager Education comprehension: verbalized understanding and returned demonstration     HOME EXERCISE PROGRAM: Access Code: [email or web address] URL: [email or web address] Date: 11/07/2021 Prepared by: Bradly Chris  Exercises - Seated Gentle Upper Trapezius Stretch  - 1 x daily - 3 reps - 30 hold - Standing Shoulder Internal Rotation Stretch with Towel  - 1 x daily - 3 reps - 60 hold - Prone Chest Stretch on Chair  - 1 x daily - 3 reps - 30-60sec  hold - Seated Rhomboid Stretch  - 1 x daily - 3 reps - 60 sec hold - Seated Shoulder Row with Anchored Resistance  - 3 x weekly - 3 sets - 10 reps - Seated Shoulder Flexion AAROM with Pulley Behind  - 1 x daily - 7 x weekly - 3 sets - 10 reps - Seated Shoulder Scaption AAROM with Pulley at Side  - 1 x daily - 7 x weekly - 3 sets - 10 reps - Standing Shoulder Flexion with Resistance  - 3 x weekly - 3 sets - 10 reps - Shoulder Abduction with Dumbbells - Thumbs  Up  - 3 x weekly - 3 sets - 10 reps   ASSESSMENT:   CLINICAL IMPRESSION: Pt presents s/p 11 weeks for  left RTC repair and subacromial decompression. She shows improvement with left shoulder ROM especially compared to prior to injection. She continues to experience decreased left shoulder AROM with abduction and internal rotation along with increased pain. Because pt left shoulder PROM>AROM, she has decreased left shoulder strength with limitations due to pain. She will continue to benefit from skilled therapy to address remaining deficits in order to improve overall QoL and return to PLOF.   OBJECTIVE IMPAIRMENTS decreased ROM, decreased strength, impaired UE functional use, and pain.    ACTIVITY LIMITATIONS lifting, dressing, reach over head, and hygiene/grooming   PARTICIPATION LIMITATIONS: cleaning, laundry, driving, and shopping   PERSONAL FACTORS  Diabetes mellitus  are also affecting patient's functional outcome.    REHAB POTENTIAL: Good   CLINICAL DECISION MAKING: Stable/uncomplicated   EVALUATION COMPLEXITY: Low     GOALS: Goals reviewed with patient? No   SHORT TERM GOALS: Target date: 09/14/2021     Pt will be independent with HEP in order to improve strength and balance in order to decrease fall risk and improve function at home and work. Baseline:  Doing exercises independently  Goal status: Achieved    2.  Patient will be able to remove left arm from sling for increased mobility and as a sign of healing.  Baseline:  Currently not wearing sling  Goal status: Achieved    3.  Patient will achieve full PROM of left shoulder as a sign of successful healing and to progress to week 4-10 of protocol.  Baseline: Shoulder Flex R/L 180/ PROM70, Shoulder Abd R/L 180/ PROM 80, Shoulder ER R/L 90/ PROM 30 11/07/21: Shoulder Flex R/L AROM 160/160, Shoulder Abd AROM R/L 160/90 , Shoulder Abd L PROM 160 Goal status: Partially Achieved        LONG TERM GOALS: Target date:  11/09/2021  (Remove Blue Hyperlink)   Patient will have improved function and activity level as evidenced by an increase in FOTO score by 10 points or more.  Baseline: 23/100 with target of 100 Goal status: Ongoing    2.  Patient will exhibit symmetrical AROM between right and left shoulder to perform reaching, lifting and overhead tasks to carry out ADLs. Baseline: Shoulder Flex R/L 180/ PROM70, Shoulder Abd R/L 180/ PROM 80, Shoulder ER R/L 90/ PROM 30  10/10/21: Shoulder Flex R/L 120/30 (120 on LUE with PROM), Shoulder Abduction AROM R/L 120/80,  Shoulder ER AROM R/L 90/40 11/07/21: Shoulder Flex R/L AROM 160/160, Shoulder Abd AROM R/L 160/90 , Shoulder Abd L PROM 160 Goal status: Partially Achieved    3.  Patient will exhibit symmetrical shoulder strength between right and left shoulder perform reaching, lifting and overhead tasks to carry out ADLs. Baseline: Unable to test left shoulder  Goal status: Ongoing      PLAN: PT FREQUENCY: 2x/week   PT DURATION: 10 weeks   PLANNED INTERVENTIONS: Therapeutic exercises, Neuromuscular re-education, Patient/Family education, Self Care, Joint mobilization, Joint manipulation, DME instructions, Dry Needling, Electrical stimulation, Cryotherapy, Moist heat, scar mobilization, Manual therapy, and Re-evaluation   PLAN FOR NEXT SESSION: Reassess goals shoulder IR and ER.   Progress PROM/AROM and shoulder and parascapular strengthening exercises within pain free ROM.    Bradly Chris PT, DPT 11/07/21, 1:32 PM

## 2021-11-07 NOTE — Assessment & Plan Note (Signed)
Stable sx, well controlled with diet/lifestyle efforts and HCTZ

## 2021-11-07 NOTE — Assessment & Plan Note (Signed)
Loosing weight with sig diet/lifestyle efforts Wt Readings from Last 5 Encounters:  11/07/21 219 lb 12.8 oz (99.7 kg)  05/24/21 227 lb (103 kg)  05/06/21 230 lb (104.3 kg)  02/16/21 243 lb 12.8 oz (110.6 kg)  02/15/21 242 lb 15.2 oz (110.2 kg)  associated HTN, HLD, DM all well controlled

## 2021-11-07 NOTE — Assessment & Plan Note (Signed)
Stable, BP well controlled and at goal today on HCTZ 12.5 mg daily Also helps with her mild LE edema She should be on ACEI/ARB for renal protection - will see if willing to restart lower dose lisinopril Recent labs reviewed She is going to send in copy of labs from [place] screening

## 2021-11-07 NOTE — Assessment & Plan Note (Signed)
Failed topical and oral tx, pt asks for podiatry referral today

## 2021-11-08 ENCOUNTER — Encounter: Payer: Self-pay | Admitting: Family Medicine

## 2021-11-09 ENCOUNTER — Encounter: Payer: Self-pay | Admitting: Physical Therapy

## 2021-11-09 ENCOUNTER — Encounter: Payer: Self-pay | Admitting: Orthopaedic Surgery

## 2021-11-09 ENCOUNTER — Encounter: Payer: Self-pay | Admitting: Sports Medicine

## 2021-11-09 ENCOUNTER — Ambulatory Visit: Payer: Managed Care, Other (non HMO) | Admitting: Dermatology

## 2021-11-09 ENCOUNTER — Ambulatory Visit: Payer: Managed Care, Other (non HMO) | Attending: Physician Assistant | Admitting: Physical Therapy

## 2021-11-09 DIAGNOSIS — L700 Acne vulgaris: Secondary | ICD-10-CM | POA: Diagnosis not present

## 2021-11-09 DIAGNOSIS — L814 Other melanin hyperpigmentation: Secondary | ICD-10-CM

## 2021-11-09 DIAGNOSIS — L720 Epidermal cyst: Secondary | ICD-10-CM

## 2021-11-09 DIAGNOSIS — L578 Other skin changes due to chronic exposure to nonionizing radiation: Secondary | ICD-10-CM | POA: Diagnosis not present

## 2021-11-09 DIAGNOSIS — M6281 Muscle weakness (generalized): Secondary | ICD-10-CM | POA: Diagnosis present

## 2021-11-09 DIAGNOSIS — L820 Inflamed seborrheic keratosis: Secondary | ICD-10-CM

## 2021-11-09 DIAGNOSIS — Z9889 Other specified postprocedural states: Secondary | ICD-10-CM | POA: Diagnosis present

## 2021-11-09 DIAGNOSIS — D229 Melanocytic nevi, unspecified: Secondary | ICD-10-CM

## 2021-11-09 DIAGNOSIS — L821 Other seborrheic keratosis: Secondary | ICD-10-CM

## 2021-11-09 DIAGNOSIS — D485 Neoplasm of uncertain behavior of skin: Secondary | ICD-10-CM | POA: Diagnosis not present

## 2021-11-09 DIAGNOSIS — M79622 Pain in left upper arm: Secondary | ICD-10-CM | POA: Insufficient documentation

## 2021-11-09 NOTE — Patient Instructions (Signed)
Due to recent changes in healthcare laws, you may see results of your pathology and/or laboratory studies on MyChart before the doctors have had a chance to review them. We understand that in some cases there may be results that are confusing or concerning to you. Please understand that not all results are received at the same time and often the doctors may need to interpret multiple results in order to provide you with the best plan of care or course of treatment. Therefore, we ask that you please give us 2 business days to thoroughly review all your results before contacting the office for clarification. Should we see a critical lab result, you will be contacted sooner.   If You Need Anything After Your Visit  If you have any questions or concerns for your doctor, please call our main line at [phone] and press option 4 to reach your doctor's medical assistant. If no one answers, please leave a voicemail as directed and we will return your call as soon as possible. Messages left after 4 pm will be answered the following business day.   You may also send us a message via MyChart. We typically respond to MyChart messages within 1-2 business days.  For prescription refills, please ask your pharmacy to contact our office. Our fax number is [phone].  If you have an urgent issue when the clinic is closed that cannot wait until the next business day, you can page your doctor at the number below.    Please note that while we do our best to be available for urgent issues outside of office hours, we are not available 24/7.   If you have an urgent issue and are unable to reach us, you may choose to seek medical care at your doctor's office, retail clinic, urgent care center, or emergency room.  If you have a medical emergency, please immediately call 911 or go to the emergency department.  Pager Numbers  - Dr. Kowalski: [phone]  - Dr. Moye: [phone]  - Dr. Stewart:  [phone]  In the event of inclement weather, please call our main line at [phone] for an update on the status of any delays or closures.  Dermatology Medication Tips: Please keep the boxes that topical medications come in in order to help keep track of the instructions about where and how to use these. Pharmacies typically print the medication instructions only on the boxes and not directly on the medication tubes.   If your medication is too expensive, please contact our office at [phone] option 4 or send us a message through MyChart.   We are unable to tell what your co-pay for medications will be in advance as this is different depending on your insurance coverage. However, we may be able to find a substitute medication at lower cost or fill out paperwork to get insurance to cover a needed medication.   If a prior authorization is required to get your medication covered by your insurance company, please allow us 1-2 business days to complete this process.  Drug prices often vary depending on where the prescription is filled and some pharmacies may offer cheaper prices.  The website www.goodrx.com contains coupons for medications through different pharmacies. The prices here do not account for what the cost may be with help from insurance (it may be cheaper with your insurance), but the website can give you the price if you did not use any insurance.  - You can print the associated coupon and take it with   your prescription to the pharmacy.  - You may also stop by our office during regular business hours and pick up a GoodRx coupon card.  - If you need your prescription sent electronically to a different pharmacy, notify our office through [facility] MyChart or by phone at [phone] option 4.     Si Usted Necesita Algo Despus de Su Visita  Tambin puede enviarnos un mensaje a travs de MyChart. Por lo general respondemos a los mensajes de MyChart en el transcurso de 1 a 2  das hbiles.  Para renovar recetas, por favor pida a su farmacia que se ponga en contacto con nuestra oficina. Nuestro nmero de fax es el [phone].  Si tiene un asunto urgente cuando la clnica est cerrada y que no puede esperar hasta el siguiente da hbil, puede llamar/localizar a su doctor(a) al nmero que aparece a continuacin.   Por favor, tenga en cuenta que aunque hacemos todo lo posible para estar disponibles para asuntos urgentes fuera del horario de oficina, no estamos disponibles las 24 horas del da, los 7 das de la semana.   Si tiene un problema urgente y no puede comunicarse con nosotros, puede optar por buscar atencin mdica  en el consultorio de su doctor(a), en una clnica privada, en un centro de atencin urgente o en una sala de emergencias.  Si tiene una emergencia mdica, por favor llame inmediatamente al 911 o vaya a la sala de emergencias.  Nmeros de bper  - Dr. Kowalski: [phone]  - Dra. Moye: [phone]  - Dra. Stewart: [phone]  En caso de inclemencias del tiempo, por favor llame a nuestra lnea principal al [phone] para una actualizacin sobre el estado de cualquier retraso o cierre.  Consejos para la medicacin en dermatologa: Por favor, guarde las cajas en las que vienen los medicamentos de uso tpico para ayudarle a seguir las instrucciones sobre dnde y cmo usarlos. Las farmacias generalmente imprimen las instrucciones del medicamento slo en las cajas y no directamente en los tubos del medicamento.   Si su medicamento es muy caro, por favor, pngase en contacto con nuestra oficina llamando al [phone] y presione la opcin 4 o envenos un mensaje a travs de MyChart.   No podemos decirle cul ser su copago por los medicamentos por adelantado ya que esto es diferente dependiendo de la cobertura de su seguro. Sin embargo, es posible que podamos encontrar un medicamento sustituto a menor costo o llenar un formulario para que el  seguro cubra el medicamento que se considera necesario.   Si se requiere una autorizacin previa para que su compaa de seguros cubra su medicamento, por favor permtanos de 1 a 2 das hbiles para completar este proceso.  Los precios de los medicamentos varan con frecuencia dependiendo del lugar de dnde se surte la receta y alguna farmacias pueden ofrecer precios ms baratos.  El sitio web www.goodrx.com tiene cupones para medicamentos de diferentes farmacias. Los precios aqu no tienen en cuenta lo que podra costar con la ayuda del seguro (puede ser ms barato con su seguro), pero el sitio web puede darle el precio si no utiliz ningn seguro.  - Puede imprimir el cupn correspondiente y llevarlo con su receta a la farmacia.  - Tambin puede pasar por nuestra oficina durante el horario de atencin regular y recoger una tarjeta de cupones de GoodRx.  - Si necesita que su receta se enve electrnicamente a una farmacia diferente, informe a nuestra oficina a travs de MyChart de [facility]   o por telfono llamando al [phone] y presione la opcin 4.   Pre-Operative Instructions  You are scheduled for a surgical procedure at [facility]. We recommend you read the following instructions. If you have any questions or concerns, please call the office at [phone].  Shower and wash the entire body with soap and water the day of your surgery paying special attention to cleansing at and around the planned surgery site.  Avoid aspirin or aspirin containing products at least fourteen (14) days prior to your surgical procedure and for at least one week (7 Days) after your surgical procedure. If you take aspirin on a regular basis for heart disease or history of stroke or for any other reason, we may recommend you continue taking aspirin but please notify us if you take this on a regular basis. Aspirin can cause more bleeding to occur during surgery as well as prolonged bleeding and  bruising after surgery.   Avoid other nonsteroidal pain medications at least one week prior to surgery and at least one week prior to your surgery. These include medications such as Ibuprofen (Motrin, Advil and Nuprin), Naprosyn, Voltaren, Relafen, etc. If medications are used for therapeutic reasons, please inform us as they can cause increased bleeding or prolonged bleeding during and bruising after surgical procedures.   Please advise [place] if you are taking any "blood thinner" medications such as Coumadin or Dipyridamole or Plavix or similar medications. These cause increased bleeding and prolonged bleeding during procedures and bruising after surgical procedures. We may have to consider discontinuing these medications briefly prior to and shortly after your surgery if safe to do so.   Please inform us of all medications you are currently taking. All medications that are taken regularly should be taken the day of surgery as you always do. Nevertheless, we need to be informed of what medications you are taking prior to surgery to know whether they will affect the procedure or cause any complications.   Please inform us of any medication allergies. Also inform us of whether you have allergies to Latex or rubber products or whether you have had any adverse reaction to Lidocaine or Epinephrine.  Please inform us of any prosthetic or artificial body parts such as artificial heart valve, joint replacements, etc., or similar condition that might require preoperative antibiotics.   We recommend avoidance of alcohol at least two weeks prior to surgery and continued avoidance for at least two weeks after surgery.   We recommend discontinuation of tobacco smoking at least two weeks prior to surgery and continued abstinence for at least two weeks after surgery.  Do not plan strenuous exercise, strenuous work or strenuous lifting for approximately four weeks after your surgery.   We request if you are unable  to make your scheduled surgical appointment, please call us at least a week in advance or as soon as you are aware of a problem so that we can cancel or reschedule the appointment.   You MAY TAKE TYLENOL (acetaminophen) for pain as it is not a blood thinner.   PLEASE PLAN TO BE IN TOWN FOR TWO WEEKS FOLLOWING SURGERY, THIS IS IMPORTANT SO YOU CAN BE CHECKED FOR DRESSING CHANGES, SUTURE REMOVAL AND TO MONITOR FOR POSSIBLE COMPLICATIONS.

## 2021-11-09 NOTE — Progress Notes (Signed)
New Patient Visit  Subjective  Tina Stephenson is a 66 y.o. female who presents for the following: Irregular skin lesions (On the scalp, face, and back - patient is concerned and would like lesions checked). The patient has spots, moles and lesions to be evaluated, some may be new or changing.  The following portions of the chart were reviewed this encounter and updated as appropriate:   Tobacco  Allergies  Meds  Problems  Med Hx  Surg Hx  Fam Hx     Review of Systems:  No other skin or systemic complaints except as noted in HPI or Assessment and Plan.  Objective  Well appearing patient in no apparent distress; mood and affect are within normal limits.  A focused examination was performed including the scalp, face, and back. Relevant physical exam findings are noted in the Assessment and Plan.  L upper back, L back above braline Subcutaneous nodule. 1.0 cm and 0.6 cm.  Sup forehead x 1, L infra scapular x 1 (2) Erythematous stuck-on, waxy papule or plaque  Back Open comedones of the back.  Left Upper Back 1.2 cm irregular brown macule.    Assessment & Plan  Epidermal inclusion cyst L upper back, L back above braline Benign-appearing. Exam most consistent with an epidermal inclusion cyst. Discussed that a cyst is a benign growth that can grow over time and sometimes get irritated or inflamed. Recommend observation if it is not bothersome. Discussed option of surgical excision to remove it if it is growing, symptomatic, or other changes noted. Please call for new or changing lesions so they can be evaluated.  Inflamed seborrheic keratosis (2) Sup forehead x 1, L infra scapular x 1 Symptomatic, irritating, patient would like treated. Destruction of lesion - Sup forehead x 1, L infra scapular x 1 Complexity: simple   Destruction method: cryotherapy   Informed consent: discussed and consent obtained   Timeout:  patient name, date of birth, surgical site, and procedure  verified Lesion destroyed using liquid nitrogen: Yes   Region frozen until ice ball extended beyond lesion: Yes   Outcome: patient tolerated procedure well with no complications   Post-procedure details: wound care instructions given    Open comedone Back Benign-appearing.  Observation.  Call clinic for new or changing lesions.  Recommend daily use of broad spectrum spf 30+ sunscreen to sun-exposed areas.   Neoplasm of uncertain behavior of skin Left Upper Back Plan biopsy at next appointment.   Actinic Damage - chronic, secondary to cumulative UV radiation exposure/sun exposure over time - diffuse scaly erythematous macules with underlying dyspigmentation - Recommend daily broad spectrum sunscreen SPF 30+ to sun-exposed areas, reapply every 2 hours as needed.  - Recommend staying in the shade or wearing long sleeves, sun glasses (UVA+UVB protection) and wide brim hats (4-inch brim around the entire circumference of the hat). - Call for new or changing lesions.  Seborrheic Keratoses - Stuck-on, waxy, tan-brown papules and/or plaques  - Benign-appearing - Discussed benign etiology and prognosis. - Observe - Call for any changes  Melanocytic Nevi - Tan-brown and/or pink-flesh-colored symmetric macules and papules - Benign appearing on exam today - Observation - Call clinic for new or changing moles - Recommend daily use of broad spectrum spf 30+ sunscreen to sun-exposed areas.   Return for surgery/cyst excision.  Luther Redo, CMA, am acting as scribe for Sarina Ser, MD . Documentation: I have reviewed the above documentation for accuracy and completeness, and I agree with the above.  Sarina Ser, MD

## 2021-11-09 NOTE — Telephone Encounter (Signed)
As long as she can be careful, we can write the note

## 2021-11-09 NOTE — Therapy (Signed)
OUTPATIENT PHYSICAL THERAPY TREATMENT  NOTE/ Re-Certification  Dates of Reporting: 08/30/21- 11/09/21  Patient Name: Tina Stephenson MRN: [id number] DOB:12-19-1955, 66 y.o., female Today's Date: 11/09/2021  PCP: Delsa Grana PA-C  REFERRING PROVIDER: Dwana Melena PA-C  END OF SESSION:   PT End of Session - 11/09/21 1326     Visit Number 17    Number of Visits 20    Date for PT Re-Evaluation 11/09/21    Authorization Type [employer] [id number]    Authorization Time Period 08/30/21-11/09/21    Authorization - Visit Number 17    Authorization - Number of Visits 40    Progress Note Due on Visit 20    PT Start Time 1330    PT Stop Time 1415    PT Time Calculation (min) 45 min    Activity Tolerance Patient tolerated treatment well    Behavior During Therapy [facility] for tasks assessed/performed               Past Medical History:  Diagnosis Date   Bronchitis    Bruised toe 12/20/2020   Cervical polyp 08/22/2018   Depression    Diabetes mellitus without complication ([place])    Gallbladder polyp    GERD (gastroesophageal reflux disease)    History of cyst of breast 03/25/2018   Had removal of 3 cysts of breasts in approx 2008-2012.   Hypertension    Hypertensive disorder 03/05/2015   Liver tumor (benign)    Nocturia 08/22/2018   Nontraumatic complete tear of left rotator cuff 08/15/2021   Nontraumatic complete tear of right rotator cuff 07/29/2021   Sacrum and coccyx fracture ([place])    Sacrum and coccyx fracture, sequela 02/21/2018   Occurred in [place] at age 41, still have residual in bilateral low back.   Sciatica    Tendinopathy of right biceps tendon 09/24/2020   Traumatic tear of supraspinatus tendon of left shoulder 09/24/2020   Traumatic tear of supraspinatus tendon of right shoulder 09/24/2020   Past Surgical History:  Procedure Laterality Date   APPENDECTOMY     NASAL SEPTUM SURGERY     VASECTOMY     WISDOM TOOTH EXTRACTION     Patient Active Problem List   Diagnosis Date  Noted   Low back pain 11/07/2021   Class 2 obesity with body mass index (BMI) of 38.0 to 38.9 in adult 11/07/2021   Leg swelling 08/02/2020   Dyslipidemia 01/05/2020   Type 2 diabetes mellitus without complication, without long-term current use of insulin ([place]) 08/22/2018   Scoliosis 03/25/2018   Onychomycosis 02/21/2018   Vitamin D deficiency 03/19/2015   Essential hypertension 03/05/2015    REFERRING DIAG: Z98.890 (ICD-10-CM) - H/O repair of rotator cuff  THERAPY DIAG:  S/P left rotator cuff repair  Pain in left upper arm  Rationale for Evaluation and Treatment Rehabilitation  PERTINENT HISTORY: S/p left rtc repair and subacrominal decompression on 08/18/21. H/o same surgery on right shoulder several years ago with successful recovery. She has been icing repeatedly throughout the day for pain and swelling management.   PRECAUTIONS:   SUBJECTIVE: Pt reports feeling increased pain with shoulder IR exercise. Otherwise, she was able to do all of the other exercises.    PAIN:  1-2/10 in left anterior shoulder; with pain medicine prior to arrival    OBJECTIVE: (objective measures completed at initial evaluation unless otherwise dated)  VITALS: BP 125/67 HR 80 SpO2 100     DIAGNOSTIC FINDINGS:  CLINICAL DATA:  Shoulder pain   EXAM:  RIGHT SHOULDER - 3 VIEW   COMPARISON:  None.   FINDINGS: There is no evidence of fracture or dislocation. Mild degenerative changes of the acromioclavicular joint. Bone anchors of the proximal humerus. Soft tissues are unremarkable.   IMPRESSION: No acute osseous abnormality.     Electronically Signed   By: Yetta Glassman M.D.   On: 02/15/2021 12:15     PATIENT SURVEYS:  FOTO 25/100 with target of 53    COGNITION: Overall cognitive status: Within functional limits for tasks assessed                                  SENSATION: WFL   POSTURE: Normal posture    UPPER EXTREMITY ROM:      Cervical: Not performed during  eval AROM  Flex    45 Ext      45  Lat Side Bend R/L 45/45 Rotation       R/L     60/60       Active ROM Right eval Left Eval/ All PROM Left 11/09/21  Shoulder flexion 180 70* 160  Shoulder extension       Shoulder abduction 180  80* 120*  Shoulder adduction       Shoulder internal rotation 70 70   Shoulder external rotation 90 30* 45*  Elbow flexion 150 150   Elbow extension 60 60   Wrist flexion 80 80   Wrist extension 70 70   Wrist ulnar deviation 30 30   Wrist radial deviation 20 20   Wrist pronation       Wrist supination       (Blank rows = not tested)             UPPER EXTREMITY MMT:   MMT Left  10/05/21  Right eval  Shoulder flexion 3-     Shoulder extension      Shoulder abduction  3+    Shoulder adduction      Shoulder internal rotation  3    Shoulder external rotation 3+     Middle trapezius      Lower trapezius      Elbow flexion      Elbow extension      Wrist flexion  4+  Wrist extension   4+  Wrist ulnar deviation   4+  Wrist radial deviation   4+  Wrist pronation      Wrist supination      Grip strength (lbs)   Fair   (Blank rows = not tested)        JOINT MOBILITY TESTING:  Not performed    PALPATION:  Not performed              TODAY'S TREATMENT:   11/09/21:  UBE with 2 resistance 2.5 min forward and 2.5 min backward  Shoulder ER Stretch with bent elbow in sitting with forward flexion against table 3 x 60 sec  Shoulder ER Stretch with straight elbow in sitting with forward flexion against table 3 x 60 sec  -Added pillow for increased height to demonstrate how to progress exercise  OMEGA shoulder rows with #10 lbs 3 x 10 Shoulder IR/ER with yellow band 1 x 5  -Pt unable to move left shoulder through ROM  Shoulder IR/ER AROM 3 x 10  -NPS is 2/10 Shoulder IR/ER with #1 DB 1 x 10  -NPS is 3/10  Shoulder IR/ER with #2  DB 1 x 10  -NPS is 4/10   FOTO: 59/100 with target of 53  11/07/21:  UBE with 0 resistance 2.5 min  forward and 2.5 min backward  Internal rotation stretch of LLE 3 x 30 sec  Supine Shoulder Flexion AAROM #3 AW 3 x 10  Supine Shoulder Abduction AAROM on LUE 1 x 5 -Pt reports increased pain to point where she needed to stop   Pulleys AAROM Shoulder Abduction on LUE 3 x 10   Shoulder AROM:  -Flexion R/L 160/160  -Abduction R/L 160/120   Shoulder PROM: -Abduction L 160 with increased pain   Shoulder Flexion AROM on LUE 1 x 10  Shoulder Flexion AROM on LUE #1 DB 1 x 10    10/26/21:  Shoulder Pulleys AAROM Flex/ Ext  with RUE active only and LUE PROM 3 x 10  Shoulder Pulleys AAROM Abd/ Add  with RUE active only and LUE PROM 3 x 10  Shoulder Flexion AAROM with #3 AW 3 x 10  Ranger Shoulder Flexion on LLE to 90 deg 1 x 5  -Pt needs to stop due to pain  Ranger Shoulder Abduction on LLE to 90 deg 1 x 5  -Pt needs to stop due to pain   MANUAL   Right side lying lat stretch and massage  -Rotation of pelvis counter to shoulder Prone Chest and Lat Stretch seated with head on table 3 x 30 sec  -min VC for hand and head placement  Upper trap trigger point release and massage   10/24/21:  Shoulder Pulleys AAROM Flex/ Ext  with RUE active only and LUE PROM 3 x 10  Shoulder Pulleys AAROM Abd/ Add  with RUE active only and LUE PROM 3 x 10  Supine Shoulder Flexion AAROM wand  between 120 with 2.5 lbs 3 x 10  -NPS 3 to 4 /10  Supine Shoulder Left D2 Flexion to 0 degree flexion 1 x 10  -NPS 8/10, pt self terminated  Right Side Lying Left Shoulder Flexion to 120 deg 1 x 10  Right Side Lying Left Shoulder Flexion to 120 deg with #1 DB 1 x 10  Side Lying Lat Stretch with PT providing over pressure over left hip 3 x 30 sec  Supine Pec Stretch with half roll underneath 30 sec  -Pt experiences too much pain to extend left elbow    10/19/21:   THEREX:   Shoulder Pulleys AAROM Flex/ Ext  with RUE active only and LUE PROM 3 x 10  Shoulder Pulleys AAROM Abd/ Add  with RUE active only and LUE  PROM 3 x 10  Supine Shoulder Flexion AAROM wand  between 95  with 2.5 lbs 3 x 10  Supine Shoulder Left Abduction AAROM wand between 0 to 75 3 x 10   MANUAL   Shoulder Flexion left to 120 degrees with heat pack  Shoulder abduction left to 90 degrees with heat pack MFR of left upper trap  MFR of right rhomboid    PATIENT EDUCATION: Education details: form and technique for appropriate exercise and explanation of plan of care.  Person educated: Patient Education method: Customer service manager Education comprehension: verbalized understanding and returned demonstration     HOME EXERCISE PROGRAM: Access Code: [email or web address] URL: [email or web address] Date: 11/09/2021 Prepared by: Bradly Chris  Exercises - Seated Gentle Upper Trapezius Stretch  - 1 x daily - 3 reps - 30 hold - Standing Shoulder Internal Rotation Stretch with Towel  - 1 x daily - 3  reps - 60 hold - Prone Chest Stretch on Chair  - 1 x daily - 3 reps - 30-60sec  hold - Seated Rhomboid Stretch  - 1 x daily - 3 reps - 60 sec hold - Seated Shoulder Row with Anchored Resistance  - 3 x weekly - 3 sets - 10 reps - Seated Shoulder Flexion AAROM with Pulley Behind  - 1 x daily - 7 x weekly - 3 sets - 10 reps - Seated Shoulder Scaption AAROM with Pulley at Side  - 1 x daily - 7 x weekly - 3 sets - 10 reps - Standing Shoulder Flexion with Resistance  - 3 x weekly - 3 sets - 10 reps - Shoulder Abduction with Dumbbells - Thumbs Up  - 3 x weekly - 3 sets - 10 reps - Shoulder External Rotation and Scapular Retraction with Resistance  - 3 x weekly - 3 sets - 10 reps - Seated Shoulder External Rotation PROM on Table  - 1 x daily - 3 reps - 60 sec  hold  ASSESSMENT:   CLINICAL IMPRESSION:  Pt presents s/p 11 weeks and continues to show improvement with left shoulder ROM with ability to abduct arm to 120 and improved perception of UE functional ability as evidenced by improved FOTO score. She is still experiencing  pain and weakness in left shoulder that is limiting her left shoulder abduction and making it so she cannot perform overhead activities. She will be meeting with physiatrist soon for consultation about an injection and whether this will improve pain response to abduction. She will continue to benefit from skilled therapy to address remaining deficits in order to improve overall QoL and return to PLOF.   OBJECTIVE IMPAIRMENTS decreased ROM, decreased strength, impaired UE functional use, and pain.    ACTIVITY LIMITATIONS lifting, dressing, reach over head, and hygiene/grooming   PARTICIPATION LIMITATIONS: cleaning, laundry, driving, and shopping   PERSONAL FACTORS  Diabetes mellitus  are also affecting patient's functional outcome.    REHAB POTENTIAL: Good   CLINICAL DECISION MAKING: Stable/uncomplicated   EVALUATION COMPLEXITY: Low     GOALS: Goals reviewed with patient? No   SHORT TERM GOALS: Target date: 09/14/2021     Pt will be independent with HEP in order to improve strength and balance in order to decrease fall risk and improve function at home and work. Baseline:  Doing exercises independently  Goal status: Achieved    2.  Patient will be able to remove left arm from sling for increased mobility and as a sign of healing.  Baseline:  Currently not wearing sling  Goal status: Achieved    3.  Patient will achieve full PROM of left shoulder as a sign of successful healing and to progress to week 4-10 of protocol.  Baseline: Shoulder Flex R/L 180/ PROM70, Shoulder Abd R/L 180/ PROM 80, Shoulder ER R/L 90/ PROM 30 11/07/21: Shoulder Flex R/L AROM 160/160, Shoulder Abd AROM R/L 160/90 , Shoulder Abd L PROM 160 Goal status: Partially Achieved        LONG TERM GOALS: Target date: 11/09/2021  (Remove Blue Hyperlink)   Patient will have improved function and activity level as evidenced by an increase in FOTO score by 10 points or more.  Baseline: 23/100 with target of 100  11/09/21:  59/100 Goal status: Achieved    2.  Patient will exhibit symmetrical AROM between right and left shoulder to perform reaching, lifting and overhead tasks to carry out ADLs. Baseline: Shoulder Flex R/L  180/ PROM70, Shoulder Abd R/L 180/ PROM 80, Shoulder ER R/L 90/ PROM 30  10/10/21: Shoulder Flex R/L 120/30 (120 on LUE with PROM), Shoulder Abduction AROM R/L 120/80,  Shoulder ER AROM R/L 90/40 11/07/21: Shoulder Flex R/L AROM 160/160, Shoulder Abd AROM R/L 160/120 , Shoulder Abd L PROM 160 Goal status: Partially Achieved    3.  Patient will exhibit symmetrical shoulder strength between right and left shoulder perform reaching, lifting and overhead tasks to carry out ADLs. Baseline: Partially Achieved  Goal status: Ongoing      PLAN: PT FREQUENCY: 2x/week   PT DURATION: 10 weeks   PLANNED INTERVENTIONS: Therapeutic exercises, Neuromuscular re-education, Patient/Family education, Self Care, Joint mobilization, Joint manipulation, DME instructions, Dry Needling, Electrical stimulation, Cryotherapy, Moist heat, scar mobilization, Manual therapy, and Re-evaluation   PLAN FOR NEXT SESSION: Reassess goals shoulder IR and ER.   Progress PROM/AROM and shoulder and parascapular strengthening exercises within pain free ROM.    Bradly Chris PT, DPT 11/09/21, 1:26 PM

## 2021-11-10 ENCOUNTER — Encounter: Payer: Managed Care, Other (non HMO) | Admitting: Physical Therapy

## 2021-11-10 ENCOUNTER — Encounter: Payer: Managed Care, Other (non HMO) | Admitting: Orthopaedic Surgery

## 2021-11-10 LAB — MICROALBUMIN / CREATININE URINE RATIO
Creatinine, Urine: 60.4 mg/dL
Microalb/Creat Ratio: <5 mg/g creat (ref 0–29)
Microalbumin, Urine: <3 ug/mL

## 2021-11-10 NOTE — Telephone Encounter (Signed)
Work note emailed per pts request

## 2021-11-11 ENCOUNTER — Encounter: Payer: Managed Care, Other (non HMO) | Admitting: Orthopaedic Surgery

## 2021-11-15 ENCOUNTER — Ambulatory Visit: Payer: Managed Care, Other (non HMO) | Admitting: Podiatry

## 2021-11-15 ENCOUNTER — Ambulatory Visit: Payer: Managed Care, Other (non HMO) | Admitting: Sports Medicine

## 2021-11-15 ENCOUNTER — Ambulatory Visit: Payer: Managed Care, Other (non HMO) | Admitting: Physical Therapy

## 2021-11-15 DIAGNOSIS — Z9889 Other specified postprocedural states: Secondary | ICD-10-CM

## 2021-11-15 DIAGNOSIS — L603 Nail dystrophy: Secondary | ICD-10-CM | POA: Diagnosis not present

## 2021-11-15 DIAGNOSIS — M79622 Pain in left upper arm: Secondary | ICD-10-CM

## 2021-11-15 NOTE — Therapy (Signed)
OUTPATIENT PHYSICAL THERAPY TREATMENT  NOTE/ Re-Certification  Dates of Reporting: 08/30/21- 11/09/21  Patient Name: Tina Stephenson MRN: [id number] DOB:1955-12-19, 66 y.o., female Today's Date: 11/15/2021  PCP: Delsa Grana PA-C  REFERRING PROVIDER: Dwana Melena PA-C  END OF SESSION:   PT End of Session - 11/15/21 1632     Visit Number 18    Number of Visits 20    Date for PT Re-Evaluation 12/09/21    Authorization Type [employer] [id number]    Authorization Time Period 11/10/21-01/10/22    Authorization - Visit Number 18    Authorization - Number of Visits 40    Progress Note Due on Visit 20    PT Start Time 0350    PT Stop Time 1630    PT Time Calculation (min) 45 min    Activity Tolerance Patient limited by pain    Behavior During Therapy [facility] for tasks assessed/performed                Past Medical History:  Diagnosis Date   Bronchitis    Bruised toe 12/20/2020   Cervical polyp 08/22/2018   Depression    Diabetes mellitus without complication ([place])    Gallbladder polyp    GERD (gastroesophageal reflux disease)    History of cyst of breast 03/25/2018   Had removal of 3 cysts of breasts in approx 2008-2012.   Hypertension    Hypertensive disorder 03/05/2015   Liver tumor (benign)    Nocturia 08/22/2018   Nontraumatic complete tear of left rotator cuff 08/15/2021   Nontraumatic complete tear of right rotator cuff 07/29/2021   Sacrum and coccyx fracture ([place])    Sacrum and coccyx fracture, sequela 02/21/2018   Occurred in [place] at age 34, still have residual in bilateral low back.   Sciatica    Tendinopathy of right biceps tendon 09/24/2020   Traumatic tear of supraspinatus tendon of left shoulder 09/24/2020   Traumatic tear of supraspinatus tendon of right shoulder 09/24/2020   Past Surgical History:  Procedure Laterality Date   APPENDECTOMY     NASAL SEPTUM SURGERY     VASECTOMY     WISDOM TOOTH EXTRACTION     Patient Active Problem List   Diagnosis Date Noted    Low back pain 11/07/2021   Class 2 obesity with body mass index (BMI) of 38.0 to 38.9 in adult 11/07/2021   Leg swelling 08/02/2020   Dyslipidemia 01/05/2020   Type 2 diabetes mellitus without complication, without long-term current use of insulin ([place]) 08/22/2018   Scoliosis 03/25/2018   Onychomycosis 02/21/2018   Vitamin D deficiency 03/19/2015   Essential hypertension 03/05/2015    REFERRING DIAG: Z98.890 (ICD-10-CM) - H/O repair of rotator cuff  THERAPY DIAG:  Pain in left upper arm  S/P left rotator cuff repair  Rationale for Evaluation and Treatment Rehabilitation  PERTINENT HISTORY: S/p left rtc repair and subacrominal decompression on 08/18/21. H/o same surgery on right shoulder several years ago with successful recovery. She has been icing repeatedly throughout the day for pain and swelling management.   PRECAUTIONS:   SUBJECTIVE: She continues to feel pain when performing left shoulder IR stretch.    PAIN:  1-2/10 in left anterior shoulder; with pain medicine prior to arrival    OBJECTIVE: (objective measures completed at initial evaluation unless otherwise dated)  VITALS: BP 125/67 HR 80 SpO2 100     DIAGNOSTIC FINDINGS:  CLINICAL DATA:  Shoulder pain   EXAM: RIGHT SHOULDER - 3 VIEW   COMPARISON:  None.   FINDINGS: There is no evidence of fracture or dislocation. Mild degenerative changes of the acromioclavicular joint. Bone anchors of the proximal humerus. Soft tissues are unremarkable.   IMPRESSION: No acute osseous abnormality.     Electronically Signed   By: Yetta Glassman M.D.   On: 02/15/2021 12:15     PATIENT SURVEYS:  FOTO 25/100 with target of 53    COGNITION: Overall cognitive status: Within functional limits for tasks assessed                                  SENSATION: WFL   POSTURE: Normal posture    UPPER EXTREMITY ROM:      Cervical: Not performed during eval AROM  Flex    45 Ext      45  Lat Side Bend R/L  45/45 Rotation       R/L     60/60       Active ROM Right eval Left Eval/ All PROM Left 11/09/21  Shoulder flexion 180 70* 160  Shoulder extension       Shoulder abduction 180  80* 120*  Shoulder adduction       Shoulder internal rotation 70 70   Shoulder external rotation 90 30* 60*  Elbow flexion 150 150   Elbow extension 60 60   Wrist flexion 80 80   Wrist extension 70 70   Wrist ulnar deviation 30 30   Wrist radial deviation 20 20   Wrist pronation       Wrist supination       (Blank rows = not tested)             UPPER EXTREMITY MMT:   MMT Left  10/05/21  Right eval  Shoulder flexion 3-     Shoulder extension      Shoulder abduction  3+    Shoulder adduction      Shoulder internal rotation  3    Shoulder external rotation 3+     Middle trapezius      Lower trapezius      Elbow flexion      Elbow extension      Wrist flexion  4+  Wrist extension   4+  Wrist ulnar deviation   4+  Wrist radial deviation   4+  Wrist pronation      Wrist supination      Grip strength (lbs)   Fair   (Blank rows = not tested)        JOINT MOBILITY TESTING:  Not performed    PALPATION:  Not performed              TODAY'S TREATMENT:   11/15/21:  UBE with 2 resistance 2.5 min forward and 2.5 min backward  Shoulder Pulleys AAROM Abduction/Adduction 3 x 10  Supine Shoulder Flexion AAROM #3 AW 3 x 15  Supine AAROM flexion of LUE using RUE 1 x 10  -Pt reports an increase in her left arm  Supine LUE AROM flexion 1 x 10  -NPS 4/10  Seated Shoulder Flexion with #2 lb DB 2 x 8  Supine Left Shoulder ER with #1 DB 1 x 10  Seated Left Shoulder ER with #1 DB 3 x 10       11/09/21:  UBE with 2 resistance 2.5 min forward and 2.5 min backward  Shoulder ER Stretch with bent elbow in sitting with forward  flexion against table 3 x 60 sec  Shoulder ER Stretch with straight elbow in sitting with forward flexion against table 3 x 60 sec  -Added pillow for increased height to  demonstrate how to progress exercise  OMEGA shoulder rows with #10 lbs 3 x 10 Shoulder IR/ER with yellow band 1 x 5  -Pt unable to move left shoulder through ROM  Shoulder IR/ER AROM 3 x 10  -NPS is 2/10 Shoulder IR/ER with #1 DB 1 x 10  -NPS is 3/10  Shoulder IR/ER with #2 DB 1 x 10  -NPS is 4/10   FOTO: 59/100 with target of 53  11/07/21:  UBE with 0 resistance 2.5 min forward and 2.5 min backward  Internal rotation stretch of LLE 3 x 30 sec  Supine Shoulder Flexion AAROM #3 AW 3 x 10  Supine Shoulder Abduction AAROM on LUE 1 x 5 -Pt reports increased pain to point where she needed to stop   Pulleys AAROM Shoulder Abduction on LUE 3 x 10   Shoulder AROM:  -Flexion R/L 160/160  -Abduction R/L 160/120   Shoulder PROM: -Abduction L 160 with increased pain   Shoulder Flexion AROM on LUE 1 x 10  Shoulder Flexion AROM on LUE #1 DB 1 x 10    10/26/21:  Shoulder Pulleys AAROM Flex/ Ext  with RUE active only and LUE PROM 3 x 10  Shoulder Pulleys AAROM Abd/ Add  with RUE active only and LUE PROM 3 x 10  Shoulder Flexion AAROM with #3 AW 3 x 10  Ranger Shoulder Flexion on LLE to 90 deg 1 x 5  -Pt needs to stop due to pain  Ranger Shoulder Abduction on LLE to 90 deg 1 x 5  -Pt needs to stop due to pain   MANUAL   Right side lying lat stretch and massage  -Rotation of pelvis counter to shoulder Prone Chest and Lat Stretch seated with head on table 3 x 30 sec  -min VC for hand and head placement  Upper trap trigger point release and massage   10/24/21:  Shoulder Pulleys AAROM Flex/ Ext  with RUE active only and LUE PROM 3 x 10  Shoulder Pulleys AAROM Abd/ Add  with RUE active only and LUE PROM 3 x 10  Supine Shoulder Flexion AAROM wand  between 120 with 2.5 lbs 3 x 10  -NPS 3 to 4 /10  Supine Shoulder Left D2 Flexion to 0 degree flexion 1 x 10  -NPS 8/10, pt self terminated  Right Side Lying Left Shoulder Flexion to 120 deg 1 x 10  Right Side Lying Left Shoulder Flexion  to 120 deg with #1 DB 1 x 10  Side Lying Lat Stretch with PT providing over pressure over left hip 3 x 30 sec  Supine Pec Stretch with half roll underneath 30 sec  -Pt experiences too much pain to extend left elbow       PATIENT EDUCATION: Education details: form and technique for appropriate exercise and explanation of plan of care.  Person educated: Patient Education method: Customer service manager Education comprehension: verbalized understanding and returned demonstration     HOME EXERCISE PROGRAM: Access Code: [email or web address] URL: [email or web address] Date: 11/15/2021 Prepared by: Bradly Chris  Exercises - Seated Gentle Upper Trapezius Stretch  - 1 x daily - 3 reps - 30 hold - Prone Chest Stretch on Chair  - 1 x daily - 3 reps - 30-60sec  hold - Seated Rhomboid Stretch  - 1 x  daily - 3 reps - 60 sec hold - Seated Shoulder Row with Anchored Resistance  - 3 x weekly - 3 sets - 10 reps - Seated Shoulder Flexion AAROM with Pulley Behind  - 1 x daily - 7 x weekly - 3 sets - 10 reps - Seated Shoulder Scaption AAROM with Pulley at Side  - 1 x daily - 7 x weekly - 3 sets - 10 reps - Standing Shoulder Flexion with Resistance  - 3 x weekly - 3 sets - 8 reps - Shoulder Abduction with Dumbbells - Thumbs Up  - 3 x weekly - 3 sets - 10 reps - Shoulder External Rotation and Scapular Retraction with Resistance  - 3 x weekly - 3 sets - 10 reps - Seated Shoulder External Rotation PROM on Table  - 1 x daily - 3 reps - 60 sec  hold - Seated Shoulder External Rotation in Abduction Supported with Dumbbell  - 3 x weekly - 3 sets - 10 reps - Supine Shoulder Flexion Extension Full Range AROM  - 3 x weekly - 3 sets - 15 reps  ASSESSMENT:   CLINICAL IMPRESSION:  Pt presents s/p 12 weeks for left RTC arthroplasty. She shows improvement with left shoulder ER and flexion strength with ability to perform exercises within increased resistance. She continues to be limited with left  shoulder abduction but she is following up with physician for an additional steroid injection to help with pain. She will continue to benefit from skilled therapy to address remaining deficits in order to improve overall QoL and return to PLOF.   OBJECTIVE IMPAIRMENTS decreased ROM, decreased strength, impaired UE functional use, and pain.    ACTIVITY LIMITATIONS lifting, dressing, reach over head, and hygiene/grooming   PARTICIPATION LIMITATIONS: cleaning, laundry, driving, and shopping   PERSONAL FACTORS  Diabetes mellitus  are also affecting patient's functional outcome.    REHAB POTENTIAL: Good   CLINICAL DECISION MAKING: Stable/uncomplicated   EVALUATION COMPLEXITY: Low     GOALS: Goals reviewed with patient? No   SHORT TERM GOALS: Target date: 09/14/2021     Pt will be independent with HEP in order to improve strength and balance in order to decrease fall risk and improve function at home and work. Baseline:  Doing exercises independently  Goal status: Achieved    2.  Patient will be able to remove left arm from sling for increased mobility and as a sign of healing.  Baseline:  Currently not wearing sling  Goal status: Achieved    3.  Patient will achieve full PROM of left shoulder as a sign of successful healing and to progress to week 4-10 of protocol.  Baseline: Shoulder Flex R/L 180/ PROM70, Shoulder Abd R/L 180/ PROM 80, Shoulder ER R/L 90/ PROM 30 11/07/21: Shoulder Flex R/L AROM 160/160, Shoulder Abd AROM R/L 160/90 , Shoulder Abd L PROM 160 Goal status: Partially Achieved        LONG TERM GOALS: Target date: 11/09/2021  (Remove Blue Hyperlink)   Patient will have improved function and activity level as evidenced by an increase in FOTO score by 10 points or more.  Baseline: 23/100 with target of 100  11/09/21: 59/100 Goal status: Achieved    2.  Patient will exhibit symmetrical AROM between right and left shoulder to perform reaching, lifting and overhead tasks to  carry out ADLs. Baseline: Shoulder Flex R/L 180/ PROM70, Shoulder Abd R/L 180/ PROM 80, Shoulder ER R/L 90/ PROM 30  10/10/21: Shoulder Flex R/L  120/30 (120 on LUE with PROM), Shoulder Abduction AROM R/L 120/80,  Shoulder ER AROM R/L 90/40 11/07/21: Shoulder Flex R/L AROM 160/160, Shoulder Abd AROM R/L 160/120 , Shoulder Abd L PROM 160 Goal status: Partially Achieved    3.  Patient will exhibit symmetrical shoulder strength between right and left shoulder perform reaching, lifting and overhead tasks to carry out ADLs. Baseline: Partially Achieved  Goal status: Ongoing      PLAN: PT FREQUENCY: 2x/week   PT DURATION: 10 weeks   PLANNED INTERVENTIONS: Therapeutic exercises, Neuromuscular re-education, Patient/Family education, Self Care, Joint mobilization, Joint manipulation, DME instructions, Dry Needling, Electrical stimulation, Cryotherapy, Moist heat, scar mobilization, Manual therapy, and Re-evaluation   PLAN FOR NEXT SESSION: Reassess goals shoulder IR and ER.   Progress PROM/AROM and shoulder and parascapular strengthening exercises within pain free ROM.    Bradly Chris PT, DPT 11/15/21, 4:34 PM

## 2021-11-15 NOTE — Progress Notes (Signed)
Chief Complaint  Patient presents with   Nail Problem    Patient is here for bilateral great toe nail fungus, she states that she has taken Lamisil for the fungus and it di not work.    HPI: 66 y.o. female presenting today as a new patient for evaluation of discoloration with thickening and nail dystrophy to the bilateral toenails that has been present for several years.  She has tried multiple rounds of oral antifungal medication with no improvement as well as topical antifungal.  She presents today for further treatment and evaluation  Past Medical History:  Diagnosis Date   Bronchitis    Bruised toe 12/20/2020   Cervical polyp 08/22/2018   Depression    Diabetes mellitus without complication ([facility])    Gallbladder polyp    GERD (gastroesophageal reflux disease)    History of cyst of breast 03/25/2018   Had removal of 3 cysts of breasts in approx 2008-2012.   Hypertension    Hypertensive disorder 03/05/2015   Liver tumor (benign)    Nocturia 08/22/2018   Nontraumatic complete tear of left rotator cuff 08/15/2021   Nontraumatic complete tear of right rotator cuff 07/29/2021   Sacrum and coccyx fracture ([place])    Sacrum and coccyx fracture, sequela 02/21/2018   Occurred in [place] at age 73, still have residual in bilateral low back.   Sciatica    Tendinopathy of right biceps tendon 09/24/2020   Traumatic tear of supraspinatus tendon of left shoulder 09/24/2020   Traumatic tear of supraspinatus tendon of right shoulder 09/24/2020    Past Surgical History:  Procedure Laterality Date   APPENDECTOMY     NASAL SEPTUM SURGERY     VASECTOMY     WISDOM TOOTH EXTRACTION      Allergies  Allergen Reactions   Penicillins Other (See Comments)    "lose my hearing"     Physical Exam: General: The patient is alert and oriented x3 in no acute distress.  Dermatology: Skin is warm, dry and supple bilateral lower extremities.  Hyperkeratotic dystrophic nails noted to the bilateral great  toes left greater than the right  Vascular: Palpable pedal pulses bilaterally. Capillary refill within normal limits.  Negative for any significant edema or erythema  Neurological: Light touch and protective threshold grossly intact  Musculoskeletal Exam: No pedal deformities noted   Assessment: 1.  Dystrophic toenails bilateral great toes secondary to prior history of trauma   Plan of Care:  1. Patient evaluated.  2.  Unfortunately the patient has tried multiple rounds of oral and topical antifungal medications with no improvement.  I do not believe she is suffering from fungal nail infection and more likely history of microtrauma to the toenails causing nail dystrophy 3.  Mechanical debridement of the nails was performed today using a nail nipper and filed smooth with a rotary bur 4.  OTC Tolcylen antifungal topical dispensed at checkout 5.  Return to clinic as needed     Edrick Kins, DPM [facility]  Dr. Edrick Kins, DPM    [address], [place] [address]                Office [phone]  Fax [phone]

## 2021-11-16 ENCOUNTER — Ambulatory Visit: Payer: Managed Care, Other (non HMO) | Admitting: Sports Medicine

## 2021-11-16 ENCOUNTER — Ambulatory Visit: Payer: Self-pay

## 2021-11-16 ENCOUNTER — Encounter: Payer: Self-pay | Admitting: Sports Medicine

## 2021-11-16 DIAGNOSIS — G8929 Other chronic pain: Secondary | ICD-10-CM | POA: Diagnosis not present

## 2021-11-16 DIAGNOSIS — Z9889 Other specified postprocedural states: Secondary | ICD-10-CM

## 2021-11-16 DIAGNOSIS — M25512 Pain in left shoulder: Secondary | ICD-10-CM

## 2021-11-16 DIAGNOSIS — M7502 Adhesive capsulitis of left shoulder: Secondary | ICD-10-CM | POA: Diagnosis not present

## 2021-11-16 MED ORDER — LIDOCAINE HCL 1 % IJ SOLN
2.0000 mL | INTRAMUSCULAR | Status: AC | PRN
  Administered 2021-11-16: 2 mL

## 2021-11-16 MED ORDER — METHYLPREDNISOLONE ACETATE 40 MG/ML IJ SUSP
40.0000 mg | INTRAMUSCULAR | Status: AC | PRN
  Administered 2021-11-16: 40 mg via INTRA_ARTICULAR

## 2021-11-16 MED ORDER — BUPIVACAINE HCL 0.25 % IJ SOLN
2.0000 mL | INTRAMUSCULAR | Status: AC | PRN
  Administered 2021-11-16: 2 mL via INTRA_ARTICULAR

## 2021-11-16 NOTE — Progress Notes (Signed)
Feels like the first injection helped some, but her ROM is still very limited

## 2021-11-16 NOTE — Progress Notes (Signed)
   Procedure Note  Patient: Tina Stephenson             Date of Birth: Jan 02, 1956           MRN: [id number]             Visit Date: 11/16/2021  Procedures: Visit Diagnoses:  1. S/P left rotator cuff repair    Large Joint Inj: L glenohumeral on 11/16/2021 9:53 AM Indications: pain Details: 22 G 3.5 in needle, ultrasound-guided posterior approach Medications: 2 mL lidocaine 1 %; 2 mL bupivacaine 0.25 %; 40 mg methylPREDNISolone acetate 40 MG/ML Outcome: tolerated well, no immediate complications  US-guided glenohumeral joint injection, left shoulder After discussion on risks/benefits/indications, informed verbal consent was obtained. A timeout was then performed. The patient was positioned lying lateral recumbent on examination table. The patient's shoulder was prepped with betadine and multiple alcohol swabs and utilizing ultrasound guidance, the patient's glenohumeral joint was identified on ultrasound. Using ultrasound guidance a 22-gauge, 3.5 inch needle with a mixture of 2:2:1 cc's lidocaine:bupivicaine:depomedrol was directed from a lateral to medial direction via in-plane technique into the glenohumeral joint with visualization of appropriate spread of injectate into the joint. Patient tolerated the procedure well without immediate complications.      Procedure, treatment alternatives, risks and benefits explained, specific risks discussed. Consent was given by the patient. Immediately prior to procedure a time out was called to verify the correct patient, procedure, equipment, support staff and site/side marked as required. Patient was prepped and draped in the usual sterile fashion.     - I evaluated the patient about 10 minutes post-injection and she had further improvement in pain and range of motion - follow-up with Dr. Erlinda Hong as indicated; I am happy to see them as needed  Elba Barman, DO [place]  This note was  dictated using Dragon naturally speaking software and may contain errors in syntax, spelling, or content which have not been identified prior to signing this note.

## 2021-11-17 ENCOUNTER — Ambulatory Visit: Payer: Managed Care, Other (non HMO) | Admitting: Physical Therapy

## 2021-11-22 ENCOUNTER — Ambulatory Visit: Payer: Managed Care, Other (non HMO) | Admitting: Physical Therapy

## 2021-11-22 ENCOUNTER — Encounter: Payer: Self-pay | Admitting: Physical Therapy

## 2021-11-22 DIAGNOSIS — Z9889 Other specified postprocedural states: Secondary | ICD-10-CM

## 2021-11-22 DIAGNOSIS — M79622 Pain in left upper arm: Secondary | ICD-10-CM | POA: Diagnosis not present

## 2021-11-22 NOTE — Therapy (Signed)
OUTPATIENT PHYSICAL THERAPY TREATMENT  NOTE/ Re-Certification  Dates of Reporting: 08/30/21- 11/09/21  Patient Name: Tina Stephenson MRN: [id number] DOB:[birth year], 66 y.o., female Today's Date: 11/22/2021  PCP: Delsa Grana PA-C  REFERRING PROVIDER: Dwana Melena PA-C  END OF SESSION:   PT End of Session - 11/22/21 1552     Visit Number 19    Number of Visits 20    Date for PT Re-Evaluation 12/09/21    Authorization Type [employer] [id number]    Authorization Time Period 11/10/21-01/10/22    Authorization - Visit Number 20    Authorization - Number of Visits 40    Progress Note Due on Visit 20    PT Start Time 0102    PT Stop Time 1630    PT Time Calculation (min) 45 min    Activity Tolerance Patient tolerated treatment well    Behavior During Therapy [facility] for tasks assessed/performed                Past Medical History:  Diagnosis Date   Bronchitis    Bruised toe 12/20/2020   Cervical polyp 08/22/2018   Depression    Diabetes mellitus without complication ([place])    Gallbladder polyp    GERD (gastroesophageal reflux disease)    History of cyst of breast 03/25/2018   Had removal of 3 cysts of breasts in approx 2008-2012.   Hypertension    Hypertensive disorder 03/05/2015   Liver tumor (benign)    Nocturia 08/22/2018   Nontraumatic complete tear of left rotator cuff 08/15/2021   Nontraumatic complete tear of right rotator cuff 07/29/2021   Sacrum and coccyx fracture ([place])    Sacrum and coccyx fracture, sequela 02/21/2018   Occurred in [place] at age 56, still have residual in bilateral low back.   Sciatica    Tendinopathy of right biceps tendon 09/24/2020   Traumatic tear of supraspinatus tendon of left shoulder 09/24/2020   Traumatic tear of supraspinatus tendon of right shoulder 09/24/2020   Past Surgical History:  Procedure Laterality Date   APPENDECTOMY     NASAL SEPTUM SURGERY     VASECTOMY     WISDOM TOOTH EXTRACTION     Patient Active Problem List   Diagnosis Date  Noted   Low back pain 11/07/2021   Class 2 obesity with body mass index (BMI) of 38.0 to 38.9 in adult 11/07/2021   Leg swelling 08/02/2020   Dyslipidemia 01/05/2020   Type 2 diabetes mellitus without complication, without long-term current use of insulin ([place]) 08/22/2018   Scoliosis 03/25/2018   Onychomycosis 02/21/2018   Vitamin D deficiency 03/19/2015   Essential hypertension 03/05/2015    REFERRING DIAG: Z98.890 (ICD-10-CM) - H/O repair of rotator cuff  THERAPY DIAG:  Pain in left upper arm  S/P left rotator cuff repair  Rationale for Evaluation and Treatment Rehabilitation  PERTINENT HISTORY: S/p left rtc repair and subacrominal decompression on 08/18/21. H/o same surgery on right shoulder several years ago with successful recovery. She has been icing repeatedly throughout the day for pain and swelling management.   PRECAUTIONS:   SUBJECTIVE: Pt reports improvement in left shoulder pain since receiving her second injection. She has since resumed her job last week.    PAIN:  1-2/10 in left anterior shoulder; with pain medicine prior to arrival    OBJECTIVE: (objective measures completed at initial evaluation unless otherwise dated)  VITALS: BP 125/67 HR 80 SpO2 100     DIAGNOSTIC FINDINGS:  CLINICAL DATA:  Shoulder pain  EXAM: RIGHT SHOULDER - 3 VIEW   COMPARISON:  None.   FINDINGS: There is no evidence of fracture or dislocation. Mild degenerative changes of the acromioclavicular joint. Bone anchors of the proximal humerus. Soft tissues are unremarkable.   IMPRESSION: No acute osseous abnormality.     Electronically Signed   By: Yetta Glassman M.D.   On: 02/15/2021 12:15     PATIENT SURVEYS:  FOTO 25/100 with target of 53    COGNITION: Overall cognitive status: Within functional limits for tasks assessed                                  SENSATION: WFL   POSTURE: Normal posture    UPPER EXTREMITY ROM:      Cervical: Not performed during  eval AROM  Flex    45 Ext      45  Lat Side Bend R/L 45/45 Rotation       R/L     60/60       Active ROM Right eval Left Eval/ All PROM Left 11/09/21 Left  11/22/21 A/PROM  Shoulder flexion 180 70* 160 160  Shoulder extension        Shoulder abduction 180  80* 120* 120/160  Shoulder adduction        Shoulder internal rotation 70 70    Shoulder external rotation 90 30* 60*   Elbow flexion 150 150    Elbow extension 60 60    Wrist flexion 80 80    Wrist extension 70 70    Wrist ulnar deviation 30 30    Wrist radial deviation 20 20    Wrist pronation        Wrist supination        (Blank rows = not tested)             UPPER EXTREMITY MMT:   MMT Left  10/05/21  Right eval  Shoulder flexion 3-     Shoulder extension      Shoulder abduction  3+    Shoulder adduction      Shoulder internal rotation  3    Shoulder external rotation 3+     Middle trapezius      Lower trapezius      Elbow flexion      Elbow extension      Wrist flexion  4+  Wrist extension   4+  Wrist ulnar deviation   4+  Wrist radial deviation   4+  Wrist pronation      Wrist supination      Grip strength (lbs)   Fair   (Blank rows = not tested)        JOINT MOBILITY TESTING:  Not performed    PALPATION:  Not performed              TODAY'S TREATMENT:   11/22/21:  UBE with 2 resistance 2.5 min forward and 2.5 min backward   LUE Shoulder Flexion to 160 deg AROM  LUE Shoulder Abduction 90 deg AROM  LUE Shoulder Abduction 160 deg PROM   LUE Shoulder Abduction Wall Walks 1 x 10  LUE Shoulder Abduction in supine to 120 deg  -Pt limited by shoulder stiffness starting at 90 deg  LUE D2 PNF Contract Relax  1 x 10  LUE Shoulder combined IR stretch with strap 2 x 30 sec  -Pt reports 9/10 on NPS  LUE Shoulder Abduction  AROM 1 x 10 to 90 deg  -NPS lower than weight at 6/10 LUE Shoulder ER stretch in supine with 2 lb   11/15/21:  UBE with 2 resistance 2.5 min forward and 2.5 min  backward  Shoulder Pulleys AAROM Abduction/Adduction 3 x 10  Supine Shoulder Flexion AAROM #3 AW 3 x 15  Supine AAROM flexion of LUE using RUE 1 x 10  -Pt reports an increase in her left arm  Supine LUE AROM flexion 1 x 10  -NPS 4/10  Seated Shoulder Flexion with #2 lb DB 2 x 8  Supine Left Shoulder ER with #1 DB 1 x 10  Seated Left Shoulder ER with #1 DB 3 x 10    11/09/21:  UBE with 2 resistance 2.5 min forward and 2.5 min backward  Shoulder ER Stretch with bent elbow in sitting with forward flexion against table 3 x 60 sec  Shoulder ER Stretch with straight elbow in sitting with forward flexion against table 3 x 60 sec  -Added pillow for increased height to demonstrate how to progress exercise  OMEGA shoulder rows with #10 lbs 3 x 10 Shoulder IR/ER with yellow band 1 x 5  -Pt unable to move left shoulder through ROM  Shoulder IR/ER AROM 3 x 10  -NPS is 2/10 Shoulder IR/ER with #1 DB 1 x 10  -NPS is 3/10  Shoulder IR/ER with #2 DB 1 x 10  -NPS is 4/10   FOTO: 59/100 with target of 53  11/07/21:  UBE with 0 resistance 2.5 min forward and 2.5 min backward  Internal rotation stretch of LLE 3 x 30 sec  Supine Shoulder Flexion AAROM #3 AW 3 x 10  Supine Shoulder Abduction AAROM on LUE 1 x 5 -Pt reports increased pain to point where she needed to stop   Pulleys AAROM Shoulder Abduction on LUE 3 x 10   Shoulder AROM:  -Flexion R/L 160/160  -Abduction R/L 160/120   Shoulder PROM: -Abduction L 160 with increased pain   Shoulder Flexion AROM on LUE 1 x 10  Shoulder Flexion AROM on LUE #1 DB 1 x 10    10/26/21:  Shoulder Pulleys AAROM Flex/ Ext  with RUE active only and LUE PROM 3 x 10  Shoulder Pulleys AAROM Abd/ Add  with RUE active only and LUE PROM 3 x 10  Shoulder Flexion AAROM with #3 AW 3 x 10  [place] Shoulder Flexion on LLE to 90 deg 1 x 5  -Pt needs to stop due to pain  [place]anger Shoulder Abduction on LLE to 90 deg 1 x 5  -Pt needs to stop due to pain   MANUAL    Right side lying lat stretch and massage  -Rotation of pelvis counter to shoulder Prone Chest and Lat Stretch seated with head on table 3 x 30 sec  -min VC for hand and head placement  Upper trap trigger point release and massage   10/24/21:  Shoulder Pulleys AAROM Flex/ Ext  with RUE active only and LUE PROM 3 x 10  Shoulder Pulleys AAROM Abd/ Add  with RUE active only and LUE PROM 3 x 10  Supine Shoulder Flexion AAROM wand  between 120 with 2.5 lbs 3 x 10  -NPS 3 to 4 /10  Supine Shoulder Left D2 Flexion to 0 degree flexion 1 x 10  -NPS 8/10, pt self terminated  Right Side Lying Left Shoulder Flexion to 120 deg 1 x 10  Right Side Lying Left Shoulder Flexion  to 120 deg with #1 DB 1 x 10  Side Lying Lat Stretch with PT providing over pressure over left hip 3 x 30 sec  Supine Pec Stretch with half roll underneath 30 sec  -Pt experiences too much pain to extend left elbow       PATIENT EDUCATION: Education details: form and technique for appropriate exercise and explanation of plan of care.  Person educated: Patient Education method: Customer service manager Education comprehension: verbalized understanding and returned demonstration     HOME EXERCISE PROGRAM: Access Code: [email or web address] URL: [email or web address] Date: 11/22/2021 Prepared by: Bradly Chris  Exercises - Seated Gentle Upper Trapezius Stretch  - 1 x daily - 3 reps - 30 hold - Seated Rhomboid Stretch  - 1 x daily - 3 reps - 60 sec hold - Seated Shoulder Row with Anchored Resistance  - 3 x weekly - 3 sets - 10 reps - Seated Shoulder Scaption AAROM with Pulley at Side  - 1 x daily - 7 x weekly - 3 sets - 10 reps - Standing Shoulder Flexion with Resistance  - 3 x weekly - 3 sets - 8 reps - Shoulder Abduction with Dumbbells - Thumbs Up  - 3 x weekly - 3 sets - 10 reps - Shoulder External Rotation and Scapular Retraction with Resistance  - 3 x weekly - 3 sets - 10 reps - Seated Shoulder External  Rotation PROM on Table  - 1 x daily - 3 reps - 60 sec  hold - Shoulder PNF D2 Flexion  - 1 x daily - 3 sets - 10 reps - Standing Shoulder Abduction Finger Walk at Wall  - 3 x weekly - 3 sets - 10 reps  ASSESSMENT:   CLINICAL IMPRESSION: Pt presents s/p 13 weeks for left RTC arthroplasty and s/p recent joint injection for pain relief. She show improvement in PROM of left shoulder abductio, but still shows limitations with left shoulder abduction strength as evidenced by PROM>AROM. HEP modified to include increased shoulder abduction AROM exercises including wall walks and D2 PNF flexion and extension pattern exercises. She still experiences increased pain at end range abduction and internal rotation. She will continue to benefit from skilled therapy to address remaining deficits in order to improve overall QoL and return to PLOF.  OBJECTIVE IMPAIRMENTS decreased ROM, decreased strength, impaired UE functional use, and pain.    ACTIVITY LIMITATIONS lifting, dressing, reach over head, and hygiene/grooming   PARTICIPATION LIMITATIONS: cleaning, laundry, driving, and shopping   PERSONAL FACTORS  Diabetes mellitus  are also affecting patient's functional outcome.    REHAB POTENTIAL: Good   CLINICAL DECISION MAKING: Stable/uncomplicated   EVALUATION COMPLEXITY: Low     GOALS: Goals reviewed with patient? No   SHORT TERM GOALS: Target date: 09/14/2021     Pt will be independent with HEP in order to improve strength and balance in order to decrease fall risk and improve function at home and work. Baseline:  Doing exercises independently  Goal status: Achieved    2.  Patient will be able to remove left arm from sling for increased mobility and as a sign of healing.  Baseline:  Currently not wearing sling  Goal status: Achieved    3.  Patient will achieve full PROM of left shoulder as a sign of successful healing and to progress to week 4-10 of protocol.  Baseline: Shoulder Flex R/L 180/  PROM70, Shoulder Abd R/L 180/ PROM 80, Shoulder ER R/L 90/ PROM 30 11/07/21: Shoulder Flex R/L AROM  160/160, Shoulder Abd AROM R/L 160/90 , Shoulder Abd L PROM 160 Goal status: Partially Achieved        LONG TERM GOALS: Target date: 11/09/2021  (Remove Blue Hyperlink)   Patient will have improved function and activity level as evidenced by an increase in FOTO score by 10 points or more.  Baseline: 23/100 with target of 100  11/09/21: 59/100 Goal status: Achieved    2.  Patient will exhibit symmetrical AROM between right and left shoulder to perform reaching, lifting and overhead tasks to carry out ADLs. Baseline: Shoulder Flex R/L 180/ PROM70, Shoulder Abd R/L 180/ PROM 80, Shoulder ER R/L 90/ PROM 30  10/10/21: Shoulder Flex R/L 120/30 (120 on LUE with PROM), Shoulder Abduction AROM R/L 120/80,  Shoulder ER AROM R/L 90/40 11/07/21: Shoulder Flex R/L AROM 160/160, Shoulder Abd AROM R/L 160/120 , Shoulder Abd L PROM 160 Goal status: Partially Achieved    3.  Patient will exhibit symmetrical shoulder strength between right and left shoulder perform reaching, lifting and overhead tasks to carry out ADLs. Baseline: Partially Achieved  Goal status: Ongoing      PLAN: PT FREQUENCY: 2x/week   PT DURATION: 10 weeks   PLANNED INTERVENTIONS: Therapeutic exercises, Neuromuscular re-education, Patient/Family education, Self Care, Joint mobilization, Joint manipulation, DME instructions, Dry Needling, Electrical stimulation, Cryotherapy, Moist heat, scar mobilization, Manual therapy, and Re-evaluation   PLAN FOR NEXT SESSION: Reassess goals. PNF with D1 and D2 movement patterns and scapular movement patterns.    Bradly Chris PT, DPT 11/22/21, 5:26 PM

## 2021-11-24 ENCOUNTER — Ambulatory Visit: Payer: Managed Care, Other (non HMO) | Admitting: Physical Therapy

## 2021-11-24 ENCOUNTER — Encounter: Payer: Self-pay | Admitting: Physical Therapy

## 2021-11-24 DIAGNOSIS — Z9889 Other specified postprocedural states: Secondary | ICD-10-CM

## 2021-11-24 DIAGNOSIS — M79622 Pain in left upper arm: Secondary | ICD-10-CM

## 2021-11-24 NOTE — Therapy (Addendum)
OUTPATIENT PHYSICAL THERAPY PROGRESS NOTE   Dates of Reporting: 08/30/21- 11/09/21  Patient Name: Tina Stephenson MRN: [id number] DOB:[birth year], 66 y.o., female Today's Date: 11/25/2021  PCP: Delsa Grana PA-C  REFERRING PROVIDER: Dwana Melena PA-C  END OF SESSION:   PT End of Session - 11/24/21 1549     Visit Number 20    Number of Visits 40    Date for PT Re-Evaluation 12/09/21    Authorization Type [employer] [id number]    Authorization Time Period 11/10/21-01/10/22    Authorization - Visit Number 19    Authorization - Number of Visits 40    Progress Note Due on Visit 20    PT Start Time 8588    PT Stop Time 1630    PT Time Calculation (min) 45 min    Activity Tolerance Patient tolerated treatment well    Behavior During Therapy [facility] for tasks assessed/performed                Past Medical History:  Diagnosis Date   Bronchitis    Bruised toe 12/20/2020   Cervical polyp 08/22/2018   Depression    Diabetes mellitus without complication ([place])    Gallbladder polyp    GERD (gastroesophageal reflux disease)    History of cyst of breast 03/25/2018   Had removal of 3 cysts of breasts in approx 2008-2012.   Hypertension    Hypertensive disorder 03/05/2015   Liver tumor (benign)    Nocturia 08/22/2018   Nontraumatic complete tear of left rotator cuff 08/15/2021   Nontraumatic complete tear of right rotator cuff 07/29/2021   Sacrum and coccyx fracture ([place])    Sacrum and coccyx fracture, sequela 02/21/2018   Occurred in [place] at age 72, still have residual in bilateral low back.   Sciatica    Tendinopathy of right biceps tendon 09/24/2020   Traumatic tear of supraspinatus tendon of left shoulder 09/24/2020   Traumatic tear of supraspinatus tendon of right shoulder 09/24/2020   Past Surgical History:  Procedure Laterality Date   APPENDECTOMY     NASAL SEPTUM SURGERY     VASECTOMY     WISDOM TOOTH EXTRACTION     Patient Active Problem List   Diagnosis Date Noted   Low back  pain 11/07/2021   Class 2 obesity with body mass index (BMI) of 38.0 to 38.9 in adult 11/07/2021   Leg swelling 08/02/2020   Dyslipidemia 01/05/2020   Type 2 diabetes mellitus without complication, without long-term current use of insulin ([place]) 08/22/2018   Scoliosis 03/25/2018   Onychomycosis 02/21/2018   Vitamin D deficiency 03/19/2015   Essential hypertension 03/05/2015    REFERRING DIAG: Z98.890 (ICD-10-CM) - H/O repair of rotator cuff  THERAPY DIAG:  Pain in left upper arm  S/P left rotator cuff repair  Rationale for Evaluation and Treatment Rehabilitation  PERTINENT HISTORY: S/p left rtc repair and subacrominal decompression on 08/18/21. H/o same surgery on right shoulder several years ago with successful recovery. She has been icing repeatedly throughout the day for pain and swelling management.   PRECAUTIONS:   SUBJECTIVE: Pt continues to feel increased pain with abduction but it is less than before she received the joint.    PAIN:  1-2/10 in left anterior shoulder; with pain medicine prior to arrival    OBJECTIVE: (objective measures completed at initial evaluation unless otherwise dated)  VITALS: BP 125/67 HR 80 SpO2 100     DIAGNOSTIC FINDINGS:  CLINICAL DATA:  Shoulder pain   EXAM: RIGHT SHOULDER -  3 VIEW   COMPARISON:  None.   FINDINGS: There is no evidence of fracture or dislocation. Mild degenerative changes of the acromioclavicular joint. Bone anchors of the proximal humerus. Soft tissues are unremarkable.   IMPRESSION: No acute osseous abnormality.     Electronically Signed   By: Yetta Glassman M.D.   On: 02/15/2021 12:15     PATIENT SURVEYS:  FOTO 25/100 with target of 53    COGNITION: Overall cognitive status: Within functional limits for tasks assessed                                  SENSATION: WFL   POSTURE: Normal posture    UPPER EXTREMITY ROM:      Cervical: Not performed during eval AROM  Flex    45 Ext      45   Lat Side Bend R/L 45/45 Rotation       R/L     60/60       Active ROM Right eval Left Eval/ All PROM Left 11/09/21 Left  11/22/21 A/PROM  Shoulder flexion 180 70* 160 160  Shoulder extension        Shoulder abduction 180  80* 120* 120/160  Shoulder adduction        Shoulder internal rotation 70 70    Shoulder external rotation 90 30* 60*   Elbow flexion 150 150    Elbow extension 60 60    Wrist flexion 80 80    Wrist extension 70 70    Wrist ulnar deviation 30 30    Wrist radial deviation 20 20    Wrist pronation        Wrist supination        (Blank rows = not tested)             UPPER EXTREMITY MMT:   MMT Left  10/05/21  Right eval  Shoulder flexion 3-     Shoulder extension      Shoulder abduction  3+    Shoulder adduction      Shoulder internal rotation  3    Shoulder external rotation 3+     Middle trapezius      Lower trapezius      Elbow flexion      Elbow extension      Wrist flexion  4+  Wrist extension   4+  Wrist ulnar deviation   4+  Wrist radial deviation   4+  Wrist pronation      Wrist supination      Grip strength (lbs)   Fair   (Blank rows = not tested)        JOINT MOBILITY TESTING:  Not performed    PALPATION:  Not performed              TODAY'S TREATMENT:   11/24/21:   THEREX   UBE with 3 resistance 2.5 min forward and 2.5 backward  PNF D2 Flex/Ext with quick stretch on LUE 3 x 10  Supine Shoulder Abduction 3 x 10  -Pt reports feeling LUE NPS of 4/10   MANUAL:   Pec Minor myofascial release of LUE  Subscapularis myofascial release of LUE  Delotoid massage Pec minor stretch    11/22/21:  UBE with 2 resistance 2.5 min forward and 2.5 min backward   LUE Shoulder Flexion to 160 deg AROM  LUE Shoulder Abduction 90 deg AROM  LUE Shoulder  Abduction 160 deg PROM   LUE Shoulder Abduction Wall Walks 1 x 10  LUE Shoulder Abduction in supine to 120 deg  -Pt limited by shoulder stiffness starting at 90 deg  LUE D2  PNF Contract Relax  1 x 10  LUE Shoulder combined IR stretch with strap 2 x 30 sec  -Pt reports 9/10 on NPS  LUE Shoulder Abduction AROM 1 x 10 to 90 deg  -NPS lower than weight at 6/10 LUE Shoulder ER stretch in supine with 2 lb   11/15/21:  UBE with 2 resistance 2.5 min forward and 2.5 min backward  Shoulder Pulleys AAROM Abduction/Adduction 3 x 10  Supine Shoulder Flexion AAROM #3 AW 3 x 15  Supine AAROM flexion of LUE using RUE 1 x 10  -Pt reports an increase in her left arm  Supine LUE AROM flexion 1 x 10  -NPS 4/10  Seated Shoulder Flexion with #2 lb DB 2 x 8  Supine Left Shoulder ER with #1 DB 1 x 10  Seated Left Shoulder ER with #1 DB 3 x 10    11/09/21:  UBE with 2 resistance 2.5 min forward and 2.5 min backward  Shoulder ER Stretch with bent elbow in sitting with forward flexion against table 3 x 60 sec  Shoulder ER Stretch with straight elbow in sitting with forward flexion against table 3 x 60 sec  -Added pillow for increased height to demonstrate how to progress exercise  OMEGA shoulder rows with #10 lbs 3 x 10 Shoulder IR/ER with yellow band 1 x 5  -Pt unable to move left shoulder through ROM  Shoulder IR/ER AROM 3 x 10  -NPS is 2/10 Shoulder IR/ER with #1 DB 1 x 10  -NPS is 3/10  Shoulder IR/ER with #2 DB 1 x 10  -NPS is 4/10   FOTO: 59/100 with target of 53  11/07/21:  UBE with 0 resistance 2.5 min forward and 2.5 min backward  Internal rotation stretch of LLE 3 x 30 sec  Supine Shoulder Flexion AAROM #3 AW 3 x 10  Supine Shoulder Abduction AAROM on LUE 1 x 5 -Pt reports increased pain to point where she needed to stop   Pulleys AAROM Shoulder Abduction on LUE 3 x 10   Shoulder AROM:  -Flexion R/L 160/160  -Abduction R/L 160/120   Shoulder PROM: -Abduction L 160 with increased pain   Shoulder Flexion AROM on LUE 1 x 10  Shoulder Flexion AROM on LUE #1 DB 1 x 10    PATIENT EDUCATION: Education details: form and technique for appropriate exercise  and explanation of plan of care.  Person educated: Patient Education method: Customer service manager Education comprehension: verbalized understanding and returned demonstration     HOME EXERCISE PROGRAM: Access Code: [email or web address] URL: [email or web address] Date: 11/22/2021 Prepared by: Bradly Chris  Exercises - Seated Gentle Upper Trapezius Stretch  - 1 x daily - 3 reps - 30 hold - Seated Rhomboid Stretch  - 1 x daily - 3 reps - 60 sec hold - Seated Shoulder Row with Anchored Resistance  - 3 x weekly - 3 sets - 10 reps - Seated Shoulder Scaption AAROM with Pulley at Side  - 1 x daily - 7 x weekly - 3 sets - 10 reps - Standing Shoulder Flexion with Resistance  - 3 x weekly - 3 sets - 8 reps - Shoulder Abduction with Dumbbells - Thumbs Up  - 3 x weekly - 3 sets - 10 reps - Shoulder External  Rotation and Scapular Retraction with Resistance  - 3 x weekly - 3 sets - 10 reps - Seated Shoulder External Rotation PROM on Table  - 1 x daily - 3 reps - 60 sec  hold - Shoulder PNF D2 Flexion  - 1 x daily - 3 sets - 10 reps - Standing Shoulder Abduction Finger Walk at Wall  - 3 x weekly - 3 sets - 10 reps  ASSESSMENT:   CLINICAL IMPRESSION: Pt presents s/p 13 weeks for left RTC arthroplasty and s/p recent joint injection for pain relief. She continues to make progress towards goals with an improvement in left shoulder abduction PROM and AROM. Future sessions to focus on regaining functional, task specific movements with little to no pain and to achieved AROM movements with more consistency. Soft tissue work did not improve overall shoulder stiffness, but further consideration for palpation of pec minor and subscapularis for improved effectiveness of myofascial release. She will continue to benefit from skilled therapy to address remaining deficits in order to improve overall QoL and return to PLOF.  OBJECTIVE IMPAIRMENTS decreased ROM, decreased strength, impaired UE functional  use, and pain.    ACTIVITY LIMITATIONS lifting, dressing, reach over head, and hygiene/grooming   PARTICIPATION LIMITATIONS: cleaning, laundry, driving, and shopping   PERSONAL FACTORS  Diabetes mellitus  are also affecting patient's functional outcome.    REHAB POTENTIAL: Good   CLINICAL DECISION MAKING: Stable/uncomplicated   EVALUATION COMPLEXITY: Low     GOALS: Goals reviewed with patient? No   SHORT TERM GOALS: Target date: 09/14/2021     Pt will be independent with HEP in order to improve strength and balance in order to decrease fall risk and improve function at home and work. Baseline:  Doing exercises independently  Goal status: Achieved    2.  Patient will be able to remove left arm from sling for increased mobility and as a sign of healing.  Baseline:  Currently not wearing sling  Goal status: Achieved    3.  Patient will achieve full PROM of left shoulder as a sign of successful healing and to progress to week 4-10 of protocol.  Baseline: Shoulder Flex R/L 180/ PROM70, Shoulder Abd R/L 180/ PROM 80, Shoulder ER R/L 90/ PROM 30 11/07/21: Shoulder Flex R/L AROM 160/160, Shoulder Abd AROM R/L 160/90 , Shoulder Abd L PROM 160  11/24/21:  Left Shoulder Abduction 160 Goal status: Partially Achieved        LONG TERM GOALS: Target date: 11/09/2021  (Remove Blue Hyperlink)   Patient will have improved function and activity level as evidenced by an increase in FOTO score by 10 points or more.  Baseline: 23/100 with target of 100  11/09/21: 59/100 Goal status: Achieved    2.  Patient will exhibit symmetrical AROM between right and left shoulder to perform reaching, lifting and overhead tasks to carry out ADLs. Baseline: Shoulder Flex R/L 180/ PROM70, Shoulder Abd R/L 180/ PROM 80, Shoulder ER R/L 90/ PROM 30  10/10/21: Shoulder Flex R/L 120/30 (120 on LUE with PROM), Shoulder Abduction AROM R/L 120/80,  Shoulder ER AROM R/L 90/40 11/07/21: Shoulder Flex R/L AROM 160/160, Shoulder  Abd AROM R/L 160/120 , Shoulder Abd L PROM 160 11/24/21: Shoulder Abd AROM R/L 160/160 (Need for consistency)  Goal status: Partially Achieved    3.  Patient will exhibit symmetrical shoulder strength between right and left shoulder perform reaching, lifting and overhead tasks to carry out ADLs. Baseline: Unable to perform many tasks 11/24/21: Unable  to reach bra strap through internal rotation and abduct in non scaption plane  Goal status: Partially achieved      PLAN: PT FREQUENCY: 2x/week   PT DURATION: 10 weeks   PLANNED INTERVENTIONS: Therapeutic exercises, Neuromuscular re-education, Patient/Family education, Self Care, Joint mobilization, Joint manipulation, DME instructions, Dry Needling, Electrical stimulation, Cryotherapy, Moist heat, scar mobilization, Manual therapy, and Re-evaluation   PLAN FOR NEXT SESSION: FOTO. Pec Minor and Subscapularis release.  PNF with D1 and D2 movement patterns and scapular movement patterns.    Bradly Chris PT, DPT 11/25/21, 8:44 AM

## 2021-11-25 ENCOUNTER — Encounter: Payer: Self-pay | Admitting: Dermatology

## 2021-11-26 LAB — HM DIABETES EYE EXAM

## 2021-11-29 ENCOUNTER — Encounter: Payer: Self-pay | Admitting: Physical Therapy

## 2021-11-29 ENCOUNTER — Ambulatory Visit: Payer: Managed Care, Other (non HMO) | Admitting: Physical Therapy

## 2021-11-29 DIAGNOSIS — Z9889 Other specified postprocedural states: Secondary | ICD-10-CM

## 2021-11-29 DIAGNOSIS — M79622 Pain in left upper arm: Secondary | ICD-10-CM

## 2021-11-29 NOTE — Therapy (Signed)
OUTPATIENT PHYSICAL THERAPY PROGRESS NOTE   Dates of Reporting: 08/30/21- 11/09/21  Patient Name: Tina Stephenson MRN: [id number] DOB:[birth year], 66 y.o., female Today's Date: 11/29/2021  PCP: Delsa Grana PA-C  REFERRING PROVIDER: Dwana Melena PA-C  END OF SESSION:   PT End of Session - 11/29/21 1542     Visit Number 21    Number of Visits 40    Date for PT Re-Evaluation 12/09/21    Authorization Type [employer] [id number]    Authorization Time Period 11/10/21-01/10/22    Authorization - Visit Number 21    Authorization - Number of Visits 40    Progress Note Due on Visit 20    PT Start Time 0923    PT Stop Time 1630    PT Time Calculation (min) 45 min    Activity Tolerance Patient tolerated treatment well    Behavior During Therapy [facility] for tasks assessed/performed                Past Medical History:  Diagnosis Date   Bronchitis    Bruised toe 12/20/2020   Cervical polyp 08/22/2018   Depression    Diabetes mellitus without complication ([place])    Gallbladder polyp    GERD (gastroesophageal reflux disease)    History of cyst of breast 03/25/2018   Had removal of 3 cysts of breasts in approx 2008-2012.   Hypertension    Hypertensive disorder 03/05/2015   Liver tumor (benign)    Nocturia 08/22/2018   Nontraumatic complete tear of left rotator cuff 08/15/2021   Nontraumatic complete tear of right rotator cuff 07/29/2021   Sacrum and coccyx fracture ([place])    Sacrum and coccyx fracture, sequela 02/21/2018   Occurred in [place] at age 61, still have residual in bilateral low back.   Sciatica    Tendinopathy of right biceps tendon 09/24/2020   Traumatic tear of supraspinatus tendon of left shoulder 09/24/2020   Traumatic tear of supraspinatus tendon of right shoulder 09/24/2020   Past Surgical History:  Procedure Laterality Date   APPENDECTOMY     NASAL SEPTUM SURGERY     VASECTOMY     WISDOM TOOTH EXTRACTION     Patient Active Problem List   Diagnosis Date Noted   Low back  pain 11/07/2021   Class 2 obesity with body mass index (BMI) of 38.0 to 38.9 in adult 11/07/2021   Leg swelling 08/02/2020   Dyslipidemia 01/05/2020   Type 2 diabetes mellitus without complication, without long-term current use of insulin ([place]) 08/22/2018   Scoliosis 03/25/2018   Onychomycosis 02/21/2018   Vitamin D deficiency 03/19/2015   Essential hypertension 03/05/2015    REFERRING DIAG: Z98.890 (ICD-10-CM) - H/O repair of rotator cuff  THERAPY DIAG:  Pain in left upper arm  S/P left rotator cuff repair  Rationale for Evaluation and Treatment Rehabilitation  PERTINENT HISTORY: S/p left rtc repair and subacrominal decompression on 08/18/21. H/o same surgery on right shoulder several years ago with successful recovery. She has been icing repeatedly throughout the day for pain and swelling management.   PRECAUTIONS:   SUBJECTIVE: Pt reports that her exercises have been going better, but she is still struggling to do IR.    PAIN:  1-2/10 in left anterior shoulder; with pain medicine prior to arrival    OBJECTIVE: (objective measures completed at initial evaluation unless otherwise dated)  VITALS: BP 125/67 HR 80 SpO2 100     DIAGNOSTIC FINDINGS:  CLINICAL DATA:  Shoulder pain   EXAM: RIGHT SHOULDER -  3 VIEW   COMPARISON:  None.   FINDINGS: There is no evidence of fracture or dislocation. Mild degenerative changes of the acromioclavicular joint. Bone anchors of the proximal humerus. Soft tissues are unremarkable.   IMPRESSION: No acute osseous abnormality.     Electronically Signed   By: Yetta Glassman M.D.   On: 02/15/2021 12:15     PATIENT SURVEYS:  FOTO 25/100 with target of 53   11/29/21: 56/100   COGNITION: Overall cognitive status: Within functional limits for tasks assessed                                  SENSATION: WFL   POSTURE: Normal posture    UPPER EXTREMITY ROM:      Cervical: Not performed during eval AROM  Flex    45 Ext       45  Lat Side Bend R/L 45/45 Rotation       R/L     60/60       Active ROM Right eval Left Eval/ All PROM Left 11/09/21 Left  11/22/21 A/PROM  Shoulder flexion 180 70* 160 160  Shoulder extension        Shoulder abduction 180  80* 120* 120/160  Shoulder adduction        Shoulder internal rotation 70 70    Shoulder external rotation 90 30* 60*   Elbow flexion 150 150    Elbow extension 60 60    Wrist flexion 80 80    Wrist extension 70 70    Wrist ulnar deviation 30 30    Wrist radial deviation 20 20    Wrist pronation        Wrist supination        (Blank rows = not tested)             UPPER EXTREMITY MMT:   MMT Left  10/05/21  Right eval  Shoulder flexion 3-     Shoulder extension      Shoulder abduction  3+    Shoulder adduction      Shoulder internal rotation  3    Shoulder external rotation 3+     Middle trapezius      Lower trapezius      Elbow flexion      Elbow extension      Wrist flexion  4+  Wrist extension   4+  Wrist ulnar deviation   4+  Wrist radial deviation   4+  Wrist pronation      Wrist supination      Grip strength (lbs)   Fair   (Blank rows = not tested)        JOINT MOBILITY TESTING:  Not performed    PALPATION:  Not performed              TODAY'S TREATMENT:   11/29/21:  THEREX   UBE resistance 3 forward and backward with seat at 9 for 5 min  Prone Shoulder T's on LUE 1 x 10  Prone Shoulder T's on LUE #1 DB 1 x 10  Prone Shoulder Extension LUE 1 x 10  Prone Shoulder Extension LUE #1 DB 2 x 10  Prone Shoulder Row LUE #8 DB 1 x 10  Prone Shoulder Row LUE #5 DB 2 x 10  Prone  Shoulder Y's on LUE 3 x 10  Supine Serratus Punches with #5 on LUE 1 x 10   -  Pt shows increased compensation -shoulder horiz abduction  Supine Serratus Punches with #2 on LUE 1 x 10  -Decreased compensation   11/24/21:   THEREX   UBE with 3 resistance 2.5 min forward and 2.5 backward  PNF D2 Flex/Ext with quick stretch on LUE 3 x 10   Supine Shoulder Abduction 3 x 10  -Pt reports feeling LUE NPS of 4/10   MANUAL:   Pec Minor myofascial release of LUE  Subscapularis myofascial release of LUE  Delotoid massage Pec minor stretch    11/22/21:  UBE with 2 resistance 2.5 min forward and 2.5 min backward   LUE Shoulder Flexion to 160 deg AROM  LUE Shoulder Abduction 90 deg AROM  LUE Shoulder Abduction 160 deg PROM   LUE Shoulder Abduction Wall Walks 1 x 10  LUE Shoulder Abduction in supine to 120 deg  -Pt limited by shoulder stiffness starting at 90 deg  LUE D2 PNF Contract Relax  1 x 10  LUE Shoulder combined IR stretch with strap 2 x 30 sec  -Pt reports 9/10 on NPS  LUE Shoulder Abduction AROM 1 x 10 to 90 deg  -NPS lower than weight at 6/10 LUE Shoulder ER stretch in supine with 2 lb   11/15/21:  UBE with 2 resistance 2.5 min forward and 2.5 min backward  Shoulder Pulleys AAROM Abduction/Adduction 3 x 10  Supine Shoulder Flexion AAROM #3 AW 3 x 15  Supine AAROM flexion of LUE using RUE 1 x 10  -Pt reports an increase in her left arm  Supine LUE AROM flexion 1 x 10  -NPS 4/10  Seated Shoulder Flexion with #2 lb DB 2 x 8  Supine Left Shoulder ER with #1 DB 1 x 10  Seated Left Shoulder ER with #1 DB 3 x 10    11/09/21:  UBE with 2 resistance 2.5 min forward and 2.5 min backward  Shoulder ER Stretch with bent elbow in sitting with forward flexion against table 3 x 60 sec  Shoulder ER Stretch with straight elbow in sitting with forward flexion against table 3 x 60 sec  -Added pillow for increased height to demonstrate how to progress exercise  OMEGA shoulder rows with #10 lbs 3 x 10 Shoulder IR/ER with yellow band 1 x 5  -Pt unable to move left shoulder through ROM  Shoulder IR/ER AROM 3 x 10  -NPS is 2/10 Shoulder IR/ER with #1 DB 1 x 10  -NPS is 3/10  Shoulder IR/ER with #2 DB 1 x 10  -NPS is 4/10   FOTO: 59/100 with target of 53    PATIENT EDUCATION: Education details: form and technique for  appropriate exercise and explanation of plan of care.  Person educated: Patient Education method: Customer service manager Education comprehension: verbalized understanding and returned demonstration     HOME EXERCISE PROGRAM: Access Code: [email or web address] URL: [email or web address] Date: 11/29/2021 Prepared by: Bradly Chris  Exercises - Seated Gentle Upper Trapezius Stretch  - 1 x daily - 3 reps - 30 hold - Seated Rhomboid Stretch  - 1 x daily - 3 reps - 60 sec hold - Seated Shoulder Scaption AAROM with Pulley at Side  - 1 x daily - 7 x weekly - 3 sets - 10 reps - Standing Shoulder Flexion with Resistance  - 3 x weekly - 3 sets - 8 reps - Seated Shoulder External Rotation PROM on Table  - 1 x daily - 3 reps - 60 sec  hold - Standing Shoulder Abduction  Finger Walk at Wall  - 3 x weekly - 3 sets - 10 reps - Prone Shoulder Horizontal Abduction  - 3 x weekly - 3 sets - 10 reps - Prone Shoulder Extension with Dumbbells  - 3 x weekly - 3 sets - 10 reps - Prone Shoulder Row  - 3 x weekly - 3 sets - 10 reps - Prone Single Arm Shoulder Y  - 3 x weekly - 3 sets - 10 reps - Single Arm Serratus Punches in Supine with Dumbbell  - 3 x weekly - 3 sets - 10 reps ASSESSMENT:   CLINICAL IMPRESSION: Pt presents s/p 14 weeks for left RTC arthroplasty and two joint injections for pain relief. Pt shows improvement in parascapular strength with ability to perform exercises in prone. She did not experience an increase in her left shoulder pain after performing exercises. She also shows an improvement in left shoulder ROM with ability to perform shoulder Y's. She will continue to benefit from skilled therapy to address remaining deficits in order to improve overall QoL and return to PLOF.   OBJECTIVE IMPAIRMENTS decreased ROM, decreased strength, impaired UE functional use, and pain.    ACTIVITY LIMITATIONS lifting, dressing, reach over head, and hygiene/grooming   PARTICIPATION LIMITATIONS:  cleaning, laundry, driving, and shopping   PERSONAL FACTORS  Diabetes mellitus  are also affecting patient's functional outcome.    REHAB POTENTIAL: Good   CLINICAL DECISION MAKING: Stable/uncomplicated   EVALUATION COMPLEXITY: Low     GOALS: Goals reviewed with patient? No   SHORT TERM GOALS: Target date: 09/14/2021     Pt will be independent with HEP in order to improve strength and balance in order to decrease fall risk and improve function at home and work. Baseline:  Doing exercises independently  Goal status: Achieved    2.  Patient will be able to remove left arm from sling for increased mobility and as a sign of healing.  Baseline:  Currently not wearing sling  Goal status: Achieved    3.  Patient will achieve full PROM of left shoulder as a sign of successful healing and to progress to week 4-10 of protocol.  Baseline: Shoulder Flex R/L 180/ PROM70, Shoulder Abd R/L 180/ PROM 80, Shoulder ER R/L 90/ PROM 30 11/07/21: Shoulder Flex R/L AROM 160/160, Shoulder Abd AROM R/L 160/90 , Shoulder Abd L PROM 160  11/24/21:  Left Shoulder Abduction 160 Goal status: Partially Achieved        LONG TERM GOALS: Target date: 11/09/2021  (Remove Blue Hyperlink)   Patient will have improved function and activity level as evidenced by an increase in FOTO score by 10 points or more.  Baseline: 23/100 with target of 100  11/09/21: 59/100  11/29/21: 56/100 Goal status: Achieved    2.  Patient will exhibit symmetrical AROM between right and left shoulder to perform reaching, lifting and overhead tasks to carry out ADLs. Baseline: Shoulder Flex R/L 180/ PROM70, Shoulder Abd R/L 180/ PROM 80, Shoulder ER R/L 90/ PROM 30  10/10/21: Shoulder Flex R/L 120/30 (120 on LUE with PROM), Shoulder Abduction AROM R/L 120/80,  Shoulder ER AROM R/L 90/40 11/07/21: Shoulder Flex R/L AROM 160/160, Shoulder Abd AROM R/L 160/120 , Shoulder Abd L PROM 160 11/24/21: Shoulder Abd AROM R/L 160/160 (Need for consistency)   Goal status: Partially Achieved    3.  Patient will exhibit symmetrical shoulder strength between right and left shoulder perform reaching, lifting and overhead tasks to carry out ADLs. Baseline: Unable  to perform many tasks 11/24/21: Unable to reach bra strap through internal rotation and abduct in non scaption plane  Goal status: Partially achieved      PLAN: PT FREQUENCY: 2x/week   PT DURATION: 10 weeks   PLANNED INTERVENTIONS: Therapeutic exercises, Neuromuscular re-education, Patient/Family education, Self Care, Joint mobilization, Joint manipulation, DME instructions, Dry Needling, Electrical stimulation, Cryotherapy, Moist heat, scar mobilization, Manual therapy, and Re-evaluation   PLAN FOR NEXT SESSION: Progress to shoulder flexion and abduction with increased resistance and ROM. Suboccipital release and upper trap myofascial release    Bradly Chris PT, DPT 11/29/21, 3:48 PM

## 2021-12-05 ENCOUNTER — Ambulatory Visit: Payer: Managed Care, Other (non HMO)

## 2021-12-05 ENCOUNTER — Encounter: Payer: Self-pay | Admitting: Physical Therapy

## 2021-12-05 DIAGNOSIS — M79622 Pain in left upper arm: Secondary | ICD-10-CM

## 2021-12-05 DIAGNOSIS — Z9889 Other specified postprocedural states: Secondary | ICD-10-CM

## 2021-12-05 NOTE — Therapy (Signed)
OUTPATIENT PHYSICAL THERAPY TREATMENT NOTE     Patient Name: Tina Stephenson MRN: [id number] DOB:02-Jul-1955, 66 y.o., female Today's Date: 12/05/2021  PCP: Delsa Grana PA-C  REFERRING PROVIDER: Dwana Melena PA-C  END OF SESSION:   PT End of Session - 12/05/21 1719     Visit Number 22    Number of Visits 40    Date for PT Re-Evaluation 12/09/21    Authorization Type [employer] [id number]    Authorization Time Period 11/10/21-01/10/22    Authorization - Number of Visits 40    Progress Note Due on Visit 20    PT Start Time 1716    PT Stop Time 1800    PT Time Calculation (min) 44 min    Activity Tolerance Patient tolerated treatment well    Behavior During Therapy [facility] for tasks assessed/performed                Past Medical History:  Diagnosis Date   Bronchitis    Bruised toe 12/20/2020   Cervical polyp 08/22/2018   Depression    Diabetes mellitus without complication ([place])    Gallbladder polyp    GERD (gastroesophageal reflux disease)    History of cyst of breast 03/25/2018   Had removal of 3 cysts of breasts in approx 2008-2012.   Hypertension    Hypertensive disorder 03/05/2015   Liver tumor (benign)    Nocturia 08/22/2018   Nontraumatic complete tear of left rotator cuff 08/15/2021   Nontraumatic complete tear of right rotator cuff 07/29/2021   Sacrum and coccyx fracture ([place])    Sacrum and coccyx fracture, sequela 02/21/2018   Occurred in [place] at age 77, still have residual in bilateral low back.   Sciatica    Tendinopathy of right biceps tendon 09/24/2020   Traumatic tear of supraspinatus tendon of left shoulder 09/24/2020   Traumatic tear of supraspinatus tendon of right shoulder 09/24/2020   Past Surgical History:  Procedure Laterality Date   APPENDECTOMY     NASAL SEPTUM SURGERY     VASECTOMY     WISDOM TOOTH EXTRACTION     Patient Active Problem List   Diagnosis Date Noted   Low back pain 11/07/2021   Class 2 obesity with body mass index (BMI) of 38.0 to  38.9 in adult 11/07/2021   Leg swelling 08/02/2020   Dyslipidemia 01/05/2020   Type 2 diabetes mellitus without complication, without long-term current use of insulin ([place]) 08/22/2018   Scoliosis 03/25/2018   Onychomycosis 02/21/2018   Vitamin D deficiency 03/19/2015   Essential hypertension 03/05/2015    REFERRING DIAG: Z98.890 (ICD-10-CM) - H/O repair of rotator cuff  THERAPY DIAG:  Pain in left upper arm  S/P left rotator cuff repair  Rationale for Evaluation and Treatment Rehabilitation  PERTINENT HISTORY: S/p left rtc repair and subacrominal decompression on 08/18/21. H/o same surgery on right shoulder several years ago with successful recovery. She has been icing repeatedly throughout the day for pain and swelling management.   PRECAUTIONS:   SUBJECTIVE: Pt reports no pain currently, having troubles with shoulder extension and IR functionally.   PAIN:  1-2/10 in left anterior shoulder; with pain medicine prior to arrival    OBJECTIVE: (objective measures completed at initial evaluation unless otherwise dated)  VITALS: BP 125/67 HR 80 SpO2 100     DIAGNOSTIC FINDINGS:  CLINICAL DATA:  Shoulder pain   EXAM: RIGHT SHOULDER - 3 VIEW   COMPARISON:  None.   FINDINGS: There is no evidence of fracture or  dislocation. Mild degenerative changes of the acromioclavicular joint. Bone anchors of the proximal humerus. Soft tissues are unremarkable.   IMPRESSION: No acute osseous abnormality.     Electronically Signed   By: Yetta Glassman M.D.   On: 02/15/2021 12:15     PATIENT SURVEYS:  FOTO 25/100 with target of 53   11/29/21: 56/100   COGNITION: Overall cognitive status: Within functional limits for tasks assessed                                  SENSATION: WFL   POSTURE: Normal posture    UPPER EXTREMITY ROM:      Cervical: Not performed during eval AROM  Flex    45 Ext      45  Lat Side Bend R/L 45/45 Rotation       R/L     60/60        Active ROM Right eval Left Eval/ All PROM Left 11/09/21 Left  11/22/21 A/PROM  Shoulder flexion 180 70* 160 160  Shoulder extension        Shoulder abduction 180  80* 120* 120/160  Shoulder adduction        Shoulder internal rotation 70 70    Shoulder external rotation 90 30* 60*   Elbow flexion 150 150    Elbow extension 60 60    Wrist flexion 80 80    Wrist extension 70 70    Wrist ulnar deviation 30 30    Wrist radial deviation 20 20    Wrist pronation        Wrist supination        (Blank rows = not tested)             UPPER EXTREMITY MMT:   MMT Left  10/05/21  Right eval  Shoulder flexion 3-     Shoulder extension      Shoulder abduction  3+    Shoulder adduction      Shoulder internal rotation  3    Shoulder external rotation 3+     Middle trapezius      Lower trapezius      Elbow flexion      Elbow extension      Wrist flexion  4+  Wrist extension   4+  Wrist ulnar deviation   4+  Wrist radial deviation   4+  Wrist pronation      Wrist supination      Grip strength (lbs)   Fair   (Blank rows = not tested)        JOINT MOBILITY TESTING:  Not performed    PALPATION:  Not performed              TODAY'S TREATMENT:  12/05/21:  THEREX:   2# DB behind back passes for shoulder IR and extension: x20 CW and CCW   Standing L shoulder extension: x20   Standing L shoulder abduction: x20   Prone exercise:   LUE Y's 2# DB, 2x10    LUE shoulder extension: 2# DB, 2x10  L shoulder T row: 5# DB, 2x10   Supine:   Serratus anterior punches: 5#, 3x10   L shoulder ER/IR with 2# DB: x40 total   Standing:   L shoulder at ~120 deg flexion, ball rotations CW, CCW: 3x30 sec/direction   L shoulder gentle overhead ball slams for RTC stabilization overhead. x20  UBE 2 minutes forward,  2 minutes backwards for cool down  PATIENT EDUCATION: Education details: form and technique for appropriate exercise and explanation of plan of care.  Person educated:  Patient Education method: Customer service manager Education comprehension: verbalized understanding and returned demonstration     HOME EXERCISE PROGRAM: Access Code: [email or web address] URL: [email or web address] Date: 11/29/2021 Prepared by: Bradly Chris  Exercises - Seated Gentle Upper Trapezius Stretch  - 1 x daily - 3 reps - 30 hold - Seated Rhomboid Stretch  - 1 x daily - 3 reps - 60 sec hold - Seated Shoulder Scaption AAROM with Pulley at Side  - 1 x daily - 7 x weekly - 3 sets - 10 reps - Standing Shoulder Flexion with Resistance  - 3 x weekly - 3 sets - 8 reps - Seated Shoulder External Rotation PROM on Table  - 1 x daily - 3 reps - 60 sec  hold - Standing Shoulder Abduction Finger Walk at Wall  - 3 x weekly - 3 sets - 10 reps - Prone Shoulder Horizontal Abduction  - 3 x weekly - 3 sets - 10 reps - Prone Shoulder Extension with Dumbbells  - 3 x weekly - 3 sets - 10 reps - Prone Shoulder Row  - 3 x weekly - 3 sets - 10 reps - Prone Single Arm Shoulder Y  - 3 x weekly - 3 sets - 10 reps - Single Arm Serratus Punches in Supine with Dumbbell  - 3 x weekly - 3 sets - 10 reps  ASSESSMENT:   CLINICAL IMPRESSION: Continuing PT POC with focus on progressive L shoulder strengthening. Introduction to standing overhead shoulder stabilization exercises and working to improve shoulder combined extension and IR for dressing ADL's. No pain concerns throughout session with good understanding of exercise. Will continue POC as able to address remaining deficits to address all goals prior to discharge.   OBJECTIVE IMPAIRMENTS decreased ROM, decreased strength, impaired UE functional use, and pain.    ACTIVITY LIMITATIONS lifting, dressing, reach over head, and hygiene/grooming   PARTICIPATION LIMITATIONS: cleaning, laundry, driving, and shopping   PERSONAL FACTORS  Diabetes mellitus  are also affecting patient's functional outcome.    REHAB POTENTIAL: Good   CLINICAL DECISION  MAKING: Stable/uncomplicated   EVALUATION COMPLEXITY: Low     GOALS: Goals reviewed with patient? No   SHORT TERM GOALS: Target date: 09/14/2021     Pt will be independent with HEP in order to improve strength and balance in order to decrease fall risk and improve function at home and work. Baseline:  Doing exercises independently  Goal status: Achieved    2.  Patient will be able to remove left arm from sling for increased mobility and as a sign of healing.  Baseline:  Currently not wearing sling  Goal status: Achieved    3.  Patient will achieve full PROM of left shoulder as a sign of successful healing and to progress to week 4-10 of protocol.  Baseline: Shoulder Flex R/L 180/ PROM70, Shoulder Abd R/L 180/ PROM 80, Shoulder ER R/L 90/ PROM 30 11/07/21: Shoulder Flex R/L AROM 160/160, Shoulder Abd AROM R/L 160/90 , Shoulder Abd L PROM 160  11/24/21:  Left Shoulder Abduction 160 Goal status: Partially Achieved        LONG TERM GOALS: Target date: 11/09/2021  (Remove Blue Hyperlink)   Patient will have improved function and activity level as evidenced by an increase in FOTO score by 10 points or more.  Baseline: 23/100 with target of 100  11/09/21: 59/100  11/29/21: 56/100 Goal status: Achieved    2.  Patient will exhibit symmetrical AROM between right and left shoulder to perform reaching, lifting and overhead tasks to carry out ADLs. Baseline: Shoulder Flex R/L 180/ PROM70, Shoulder Abd R/L 180/ PROM 80, Shoulder ER R/L 90/ PROM 30  10/10/21: Shoulder Flex R/L 120/30 (120 on LUE with PROM), Shoulder Abduction AROM R/L 120/80,  Shoulder ER AROM R/L 90/40 11/07/21: Shoulder Flex R/L AROM 160/160, Shoulder Abd AROM R/L 160/120 , Shoulder Abd L PROM 160 11/24/21: Shoulder Abd AROM R/L 160/160 (Need for consistency)  Goal status: Partially Achieved    3.  Patient will exhibit symmetrical shoulder strength between right and left shoulder perform reaching, lifting and overhead tasks to carry  out ADLs. Baseline: Unable to perform many tasks 11/24/21: Unable to reach bra strap through internal rotation and abduct in non scaption plane  Goal status: Partially achieved      PLAN: PT FREQUENCY: 2x/week   PT DURATION: 10 weeks   PLANNED INTERVENTIONS: Therapeutic exercises, Neuromuscular re-education, Patient/Family education, Self Care, Joint mobilization, Joint manipulation, DME instructions, Dry Needling, Electrical stimulation, Cryotherapy, Moist heat, scar mobilization, Manual therapy, and Re-evaluation   PLAN FOR NEXT SESSION: Progress to shoulder flexion and abduction with increased resistance and ROM. Suboccipital release and upper trap myofascial release   Salem Caster. Fairly IV, PT, DPT Physical Therapist- [facility]  12/05/21, 6:00 PM

## 2021-12-07 ENCOUNTER — Ambulatory Visit: Payer: Managed Care, Other (non HMO)

## 2021-12-07 ENCOUNTER — Ambulatory Visit: Payer: Managed Care, Other (non HMO) | Admitting: Orthopaedic Surgery

## 2021-12-07 ENCOUNTER — Encounter: Payer: Self-pay | Admitting: Physical Therapy

## 2021-12-07 DIAGNOSIS — Z9889 Other specified postprocedural states: Secondary | ICD-10-CM

## 2021-12-07 DIAGNOSIS — M6281 Muscle weakness (generalized): Secondary | ICD-10-CM

## 2021-12-07 DIAGNOSIS — M79622 Pain in left upper arm: Secondary | ICD-10-CM | POA: Diagnosis not present

## 2021-12-07 NOTE — Therapy (Signed)
OUTPATIENT PHYSICAL THERAPY TREATMENT NOTE     Patient Name: Tina Stephenson MRN: [id number] DOB:10/20/1955, 66 y.o., female Today's Date: 12/07/2021  PCP: Delsa Grana PA-C  REFERRING PROVIDER: Dwana Melena PA-C  END OF SESSION:   PT End of Session - 12/07/21 1728     Visit Number 23    Number of Visits 40    Date for PT Re-Evaluation 12/09/21    Authorization Type [employer] [id number]    Authorization Time Period 11/10/21-01/10/22    Authorization - Number of Visits 40    Progress Note Due on Visit 20    PT Start Time 1716    PT Stop Time 1753    PT Time Calculation (min) 37 min    Activity Tolerance Patient tolerated treatment well    Behavior During Therapy [facility] for tasks assessed/performed                Past Medical History:  Diagnosis Date   Bronchitis    Bruised toe 12/20/2020   Cervical polyp 08/22/2018   Depression    Diabetes mellitus without complication ([place])    Gallbladder polyp    GERD (gastroesophageal reflux disease)    History of cyst of breast 03/25/2018   Had removal of 3 cysts of breasts in approx 2008-2012.   Hypertension    Hypertensive disorder 03/05/2015   Liver tumor (benign)    Nocturia 08/22/2018   Nontraumatic complete tear of left rotator cuff 08/15/2021   Nontraumatic complete tear of right rotator cuff 07/29/2021   Sacrum and coccyx fracture ([place])    Sacrum and coccyx fracture, sequela 02/21/2018   Occurred in [place] at age 57, still have residual in bilateral low back.   Sciatica    Tendinopathy of right biceps tendon 09/24/2020   Traumatic tear of supraspinatus tendon of left shoulder 09/24/2020   Traumatic tear of supraspinatus tendon of right shoulder 09/24/2020   Past Surgical History:  Procedure Laterality Date   APPENDECTOMY     NASAL SEPTUM SURGERY     VASECTOMY     WISDOM TOOTH EXTRACTION     Patient Active Problem List   Diagnosis Date Noted   Low back pain 11/07/2021   Class 2 obesity with body mass index (BMI) of 38.0 to  38.9 in adult 11/07/2021   Leg swelling 08/02/2020   Dyslipidemia 01/05/2020   Type 2 diabetes mellitus without complication, without long-term current use of insulin ([place]) 08/22/2018   Scoliosis 03/25/2018   Onychomycosis 02/21/2018   Vitamin D deficiency 03/19/2015   Essential hypertension 03/05/2015    REFERRING DIAG: Z98.890 (ICD-10-CM) - H/O repair of rotator cuff  THERAPY DIAG:  Pain in left upper arm  S/P left rotator cuff repair  Muscle weakness (generalized)  Rationale for Evaluation and Treatment Rehabilitation  PERTINENT HISTORY: S/p left rtc repair and subacrominal decompression on 08/18/21. H/o same surgery on right shoulder several years ago with successful recovery. She has been icing repeatedly throughout the day for pain and swelling management.   PRECAUTIONS:   SUBJECTIVE: Pt reports no pain currently. Minor soreness after last session.    PAIN:  1-2/10 in left anterior shoulder; with pain medicine prior to arrival    OBJECTIVE: (objective measures completed at initial evaluation unless otherwise dated)  VITALS: BP 125/67 HR 80 SpO2 100     DIAGNOSTIC FINDINGS:  CLINICAL DATA:  Shoulder pain   EXAM: RIGHT SHOULDER - 3 VIEW   COMPARISON:  None.   FINDINGS: There is no evidence of  fracture or dislocation. Mild degenerative changes of the acromioclavicular joint. Bone anchors of the proximal humerus. Soft tissues are unremarkable.   IMPRESSION: No acute osseous abnormality.     Electronically Signed   By: Yetta Glassman M.D.   On: 02/15/2021 12:15     PATIENT SURVEYS:  FOTO 25/100 with target of 53   11/29/21: 56/100   COGNITION: Overall cognitive status: Within functional limits for tasks assessed                                  SENSATION: WFL   POSTURE: Normal posture    UPPER EXTREMITY ROM:      Cervical: Not performed during eval AROM  Flex    45 Ext      45  Lat Side Bend R/L 45/45 Rotation       R/L     60/60        Active ROM Right eval Left Eval/ All PROM Left 11/09/21 Left  11/22/21 A/PROM  Shoulder flexion 180 70* 160 160  Shoulder extension        Shoulder abduction 180  80* 120* 120/160  Shoulder adduction        Shoulder internal rotation 70 70    Shoulder external rotation 90 30* 60*   Elbow flexion 150 150    Elbow extension 60 60    Wrist flexion 80 80    Wrist extension 70 70    Wrist ulnar deviation 30 30    Wrist radial deviation 20 20    Wrist pronation        Wrist supination        (Blank rows = not tested)             UPPER EXTREMITY MMT:   MMT Left  10/05/21  Right eval  Shoulder flexion 3-     Shoulder extension      Shoulder abduction  3+    Shoulder adduction      Shoulder internal rotation  3    Shoulder external rotation 3+     Middle trapezius      Lower trapezius      Elbow flexion      Elbow extension      Wrist flexion  4+  Wrist extension   4+  Wrist ulnar deviation   4+  Wrist radial deviation   4+  Wrist pronation      Wrist supination      Grip strength (lbs)   Fair   (Blank rows = not tested)        JOINT MOBILITY TESTING:  Not performed    PALPATION:  Not performed              TODAY'S TREATMENT:  12/07/21:  THEREX:   UBE L3 resistance 2.5 min forward, 2.5 min backward for warm up   Standing:    L shoulder at ~120 deg flexion, ball rotations CW, CCW: 3x30 sec/direction    L shoulder gentle overhead ball slams for RTC stabilization overhead. X20   L shoulder flexion Yellow body blade x25.    2# DB behind back passes for shoulder IR and extension: x20 CW and CCW   Prone exercise:    LUE Y's 3# DB, 3x10    LUE shoulder extension: 3# DB, 3x10   L shoulder T row: 5# DB, 3x10   Supine serratus punches: 5 # DB, 3x10  Side lying L shoulder ER/IR with 2# DB. 3x15.      PATIENT EDUCATION: Education details: form and technique for appropriate exercise and explanation of plan of care.  Person educated:  Patient Education method: Customer service manager Education comprehension: verbalized understanding and returned demonstration     HOME EXERCISE PROGRAM: Access Code: [email or web address] URL: [email or web address] Date: 11/29/2021 Prepared by: Bradly Chris  Exercises - Seated Gentle Upper Trapezius Stretch  - 1 x daily - 3 reps - 30 hold - Seated Rhomboid Stretch  - 1 x daily - 3 reps - 60 sec hold - Seated Shoulder Scaption AAROM with Pulley at Side  - 1 x daily - 7 x weekly - 3 sets - 10 reps - Standing Shoulder Flexion with Resistance  - 3 x weekly - 3 sets - 8 reps - Seated Shoulder External Rotation PROM on Table  - 1 x daily - 3 reps - 60 sec  hold - Standing Shoulder Abduction Finger Walk at Wall  - 3 x weekly - 3 sets - 10 reps - Prone Shoulder Horizontal Abduction  - 3 x weekly - 3 sets - 10 reps - Prone Shoulder Extension with Dumbbells  - 3 x weekly - 3 sets - 10 reps - Prone Shoulder Row  - 3 x weekly - 3 sets - 10 reps - Prone Single Arm Shoulder Y  - 3 x weekly - 3 sets - 10 reps - Single Arm Serratus Punches in Supine with Dumbbell  - 3 x weekly - 3 sets - 10 reps  ASSESSMENT:   CLINICAL IMPRESSION: Continuing PT POC  with further focus on standing with focus on progressive L shoulder strengthening. Pt remains highly motivated tolerating all exercises with minimal elevation of pain. Will continue to progress shoulder IR and overhead strengthening to return AROM for functional tasks and overhead ADL's. Will continue POC as able to address remaining deficits to address all goals prior to discharge.   OBJECTIVE IMPAIRMENTS decreased ROM, decreased strength, impaired UE functional use, and pain.    ACTIVITY LIMITATIONS lifting, dressing, reach over head, and hygiene/grooming   PARTICIPATION LIMITATIONS: cleaning, laundry, driving, and shopping   PERSONAL FACTORS  Diabetes mellitus  are also affecting patient's functional outcome.    REHAB POTENTIAL: Good    CLINICAL DECISION MAKING: Stable/uncomplicated   EVALUATION COMPLEXITY: Low     GOALS: Goals reviewed with patient? No   SHORT TERM GOALS: Target date: 09/14/2021     Pt will be independent with HEP in order to improve strength and balance in order to decrease fall risk and improve function at home and work. Baseline:  Doing exercises independently  Goal status: Achieved    2.  Patient will be able to remove left arm from sling for increased mobility and as a sign of healing.  Baseline:  Currently not wearing sling  Goal status: Achieved    3.  Patient will achieve full PROM of left shoulder as a sign of successful healing and to progress to week 4-10 of protocol.  Baseline: Shoulder Flex R/L 180/ PROM70, Shoulder Abd R/L 180/ PROM 80, Shoulder ER R/L 90/ PROM 30 11/07/21: Shoulder Flex R/L AROM 160/160, Shoulder Abd AROM R/L 160/90 , Shoulder Abd L PROM 160  11/24/21:  Left Shoulder Abduction 160 Goal status: Partially Achieved        LONG TERM GOALS: Target date: 11/09/2021  (Remove Blue Hyperlink)   Patient will have improved function and activity level as evidenced by an increase in  FOTO score by 10 points or more.  Baseline: 23/100 with target of 100  11/09/21: 59/100  11/29/21: 56/100 Goal status: Achieved    2.  Patient will exhibit symmetrical AROM between right and left shoulder to perform reaching, lifting and overhead tasks to carry out ADLs. Baseline: Shoulder Flex R/L 180/ PROM70, Shoulder Abd R/L 180/ PROM 80, Shoulder ER R/L 90/ PROM 30  10/10/21: Shoulder Flex R/L 120/30 (120 on LUE with PROM), Shoulder Abduction AROM R/L 120/80,  Shoulder ER AROM R/L 90/40 11/07/21: Shoulder Flex R/L AROM 160/160, Shoulder Abd AROM R/L 160/120 , Shoulder Abd L PROM 160 11/24/21: Shoulder Abd AROM R/L 160/160 (Need for consistency)  Goal status: Partially Achieved    3.  Patient will exhibit symmetrical shoulder strength between right and left shoulder perform reaching, lifting and  overhead tasks to carry out ADLs. Baseline: Unable to perform many tasks 11/24/21: Unable to reach bra strap through internal rotation and abduct in non scaption plane  Goal status: Partially achieved      PLAN: PT FREQUENCY: 2x/week   PT DURATION: 10 weeks   PLANNED INTERVENTIONS: Therapeutic exercises, Neuromuscular re-education, Patient/Family education, Self Care, Joint mobilization, Joint manipulation, DME instructions, Dry Needling, Electrical stimulation, Cryotherapy, Moist heat, scar mobilization, Manual therapy, and Re-evaluation   PLAN FOR NEXT SESSION: Progress to shoulder flexion and abduction with increased resistance and ROM. Suboccipital release and upper trap myofascial release   Salem Caster. Fairly IV, PT, DPT Physical Therapist- [facility]  12/07/21, 5:55 PM

## 2021-12-08 ENCOUNTER — Ambulatory Visit (INDEPENDENT_AMBULATORY_CARE_PROVIDER_SITE_OTHER): Payer: Managed Care, Other (non HMO) | Admitting: Physician Assistant

## 2021-12-08 DIAGNOSIS — Z9889 Other specified postprocedural states: Secondary | ICD-10-CM

## 2021-12-08 NOTE — Progress Notes (Signed)
Post-Op Visit Note   Patient: Tina Stephenson           Date of Birth: [birth year]           MRN: [id number] Visit Date: 12/08/2021 PCP: Delsa Grana, PA-C   Assessment & Plan:  Chief Complaint:  Chief Complaint  Patient presents with   Left Shoulder - Follow-up   Visit Diagnoses:  1. S/P left rotator cuff repair     Plan: Patient is a pleasant 66 year old female comes in today approximately 3 months status post left shoulder rotator cuff repair 08/18/2021.  She has been doing well.  Minimal to no pain.  She has been progressing in physical therapy quite a bit.  She notes that the 2 intra-articular cortisone injections for frozen shoulder helped tremendously.  Examination of her left shoulder reveals forward flexion to approximately 170 degrees, abduction to approximately 120 degrees and internal rotation to L5.  4 out of 5 strength throughout.  She is neurovascular intact distally.  At this point, I would like for her to continue with formal physical therapy as well as an home exercise program.  She will follow-up with [place] in 6 weeks for recheck.  Call with concerns or questions.  Follow-Up Instructions: Return in about 6 weeks (around 01/19/2022).   Orders:  No orders of the defined types were placed in this encounter.  No orders of the defined types were placed in this encounter.   Imaging: No new imaging  PMFS History: Patient Active Problem List   Diagnosis Date Noted   Low back pain 11/07/2021   Class 2 obesity with body mass index (BMI) of 38.0 to 38.9 in adult 11/07/2021   Leg swelling 08/02/2020   Dyslipidemia 01/05/2020   Type 2 diabetes mellitus without complication, without long-term current use of insulin ([place]) 08/22/2018   Scoliosis 03/25/2018   Onychomycosis 02/21/2018   Vitamin D deficiency 03/19/2015   Essential hypertension 03/05/2015   Past Medical History:  Diagnosis Date   Bronchitis    Bruised toe 12/20/2020   Cervical polyp 08/22/2018   Depression     Diabetes mellitus without complication ([facility])    Gallbladder polyp    GERD (gastroesophageal reflux disease)    History of cyst of breast 03/25/2018   Had removal of 3 cysts of breasts in approx 2008-2012.   Hypertension    Hypertensive disorder 03/05/2015   Liver tumor (benign)    Nocturia 08/22/2018   Nontraumatic complete tear of left rotator cuff 08/15/2021   Nontraumatic complete tear of right rotator cuff 07/29/2021   Sacrum and coccyx fracture ([place])    Sacrum and coccyx fracture, sequela 02/21/2018   Occurred in [place] at age 34, still have residual in bilateral low back.   Sciatica    Tendinopathy of right biceps tendon 09/24/2020   Traumatic tear of supraspinatus tendon of left shoulder 09/24/2020   Traumatic tear of supraspinatus tendon of right shoulder 09/24/2020    Family History  Problem Relation Age of Onset   COPD Mother    Cancer Mother    Heart disease Father    Breast cancer Paternal Aunt    Diabetes Brother    Diabetes Maternal Grandfather    Ovarian cancer Neg Hx    Colon cancer Neg Hx     Past Surgical History:  Procedure Laterality Date   APPENDECTOMY     NASAL SEPTUM SURGERY     VASECTOMY     WISDOM TOOTH EXTRACTION     Social  History   Occupational History   Not on file  Tobacco Use   Smoking status: Never   Smokeless tobacco: Never  Vaping Use   Vaping Use: Never used  Substance and Sexual Activity   Alcohol use: Never   Drug use: Never   Sexual activity: Not Currently    Birth control/protection: None

## 2021-12-13 ENCOUNTER — Ambulatory Visit: Payer: Managed Care, Other (non HMO) | Attending: Physician Assistant

## 2021-12-13 ENCOUNTER — Ambulatory Visit: Payer: Managed Care, Other (non HMO) | Admitting: Dermatology

## 2021-12-13 DIAGNOSIS — M6281 Muscle weakness (generalized): Secondary | ICD-10-CM | POA: Diagnosis present

## 2021-12-13 DIAGNOSIS — L918 Other hypertrophic disorders of the skin: Secondary | ICD-10-CM

## 2021-12-13 DIAGNOSIS — M79622 Pain in left upper arm: Secondary | ICD-10-CM | POA: Diagnosis present

## 2021-12-13 DIAGNOSIS — L720 Epidermal cyst: Secondary | ICD-10-CM | POA: Diagnosis not present

## 2021-12-13 DIAGNOSIS — D485 Neoplasm of uncertain behavior of skin: Secondary | ICD-10-CM

## 2021-12-13 DIAGNOSIS — Z9889 Other specified postprocedural states: Secondary | ICD-10-CM | POA: Insufficient documentation

## 2021-12-13 NOTE — Therapy (Signed)
OUTPATIENT PHYSICAL THERAPY TREATMENT NOTE     Patient Name: Tina Stephenson MRN: [id number] DOB:1955-11-23, 66 y.o., female Today's Date: 12/13/2021  PCP: Delsa Grana PA-C  REFERRING PROVIDER: Dwana Melena PA-C  END OF SESSION:   PT End of Session - 12/13/21 1719     Visit Number 24    Number of Visits 40    Date for PT Re-Evaluation 01/10/22    Authorization Type [employer] [id number]    Authorization Time Period 11/10/21-01/10/22    Authorization - Number of Visits 40    Progress Note Due on Visit 20    PT Start Time 7622    PT Stop Time 1758    PT Time Calculation (min) 43 min    Activity Tolerance Patient tolerated treatment well    Behavior During Therapy [facility] for tasks assessed/performed                Past Medical History:  Diagnosis Date   Bronchitis    Bruised toe 12/20/2020   Cervical polyp 08/22/2018   Depression    Diabetes mellitus without complication ([place])    Gallbladder polyp    GERD (gastroesophageal reflux disease)    History of cyst of breast 03/25/2018   Had removal of 3 cysts of breasts in approx 2008-2012.   Hypertension    Hypertensive disorder 03/05/2015   Liver tumor (benign)    Nocturia 08/22/2018   Nontraumatic complete tear of left rotator cuff 08/15/2021   Nontraumatic complete tear of right rotator cuff 07/29/2021   Sacrum and coccyx fracture ([place])    Sacrum and coccyx fracture, sequela 02/21/2018   Occurred in [place] at age 72, still have residual in bilateral low back.   Sciatica    Tendinopathy of right biceps tendon 09/24/2020   Traumatic tear of supraspinatus tendon of left shoulder 09/24/2020   Traumatic tear of supraspinatus tendon of right shoulder 09/24/2020   Past Surgical History:  Procedure Laterality Date   APPENDECTOMY     NASAL SEPTUM SURGERY     VASECTOMY     WISDOM TOOTH EXTRACTION     Patient Active Problem List   Diagnosis Date Noted   Low back pain 11/07/2021   Class 2 obesity with body mass index (BMI) of 38.0 to  38.9 in adult 11/07/2021   Leg swelling 08/02/2020   Dyslipidemia 01/05/2020   Type 2 diabetes mellitus without complication, without long-term current use of insulin ([place]) 08/22/2018   Scoliosis 03/25/2018   Onychomycosis 02/21/2018   Vitamin D deficiency 03/19/2015   Essential hypertension 03/05/2015    REFERRING DIAG: Z98.890 (ICD-10-CM) - H/O repair of rotator cuff  THERAPY DIAG:  Pain in left upper arm  S/P left rotator cuff repair  Muscle weakness (generalized)  Rationale for Evaluation and Treatment Rehabilitation  PERTINENT HISTORY: S/p left rtc repair and subacrominal decompression on 08/18/21. H/o same surgery on right shoulder several years ago with successful recovery. She has been icing repeatedly throughout the day for pain and swelling management.   PRECAUTIONS:   SUBJECTIVE: Pt reports no pain currently.Has R knee pain and stiffness. Had cyst removed on her L neck. Reports L shoudler extension and IR is coming along with improved mobility and less pain performing dressing ADL's.   PAIN:  1-2/10 in left anterior shoulder; with pain medicine prior to arrival    OBJECTIVE: (objective measures completed at initial evaluation unless otherwise dated)  VITALS: BP 125/67 HR 80 SpO2 100     DIAGNOSTIC FINDINGS:  CLINICAL DATA:  Shoulder pain   EXAM: RIGHT SHOULDER - 3 VIEW   COMPARISON:  None.   FINDINGS: There is no evidence of fracture or dislocation. Mild degenerative changes of the acromioclavicular joint. Bone anchors of the proximal humerus. Soft tissues are unremarkable.   IMPRESSION: No acute osseous abnormality.     Electronically Signed   By: Yetta Glassman M.D.   On: 02/15/2021 12:15     PATIENT SURVEYS:  FOTO 25/100 with target of 53   11/29/21: 56/100   COGNITION: Overall cognitive status: Within functional limits for tasks assessed                                  SENSATION: WFL   POSTURE: Normal posture    UPPER EXTREMITY  ROM:      Cervical: Not performed during eval AROM  Flex    45 Ext      45  Lat Side Bend R/L 45/45 Rotation       R/L     60/60       Active ROM Right eval Left Eval/ All PROM Left 11/09/21 Left  11/22/21 A/PROM  Shoulder flexion 180 70* 160 160  Shoulder extension        Shoulder abduction 180  80* 120* 120/160  Shoulder adduction        Shoulder internal rotation 70 70    Shoulder external rotation 90 30* 60*   Elbow flexion 150 150    Elbow extension 60 60    Wrist flexion 80 80    Wrist extension 70 70    Wrist ulnar deviation 30 30    Wrist radial deviation 20 20    Wrist pronation        Wrist supination        (Blank rows = not tested)             UPPER EXTREMITY MMT:   MMT Left  10/05/21  Right eval  Shoulder flexion 3-     Shoulder extension      Shoulder abduction  3+    Shoulder adduction      Shoulder internal rotation  3    Shoulder external rotation 3+     Middle trapezius      Lower trapezius      Elbow flexion      Elbow extension      Wrist flexion  4+  Wrist extension   4+  Wrist ulnar deviation   4+  Wrist radial deviation   4+  Wrist pronation      Wrist supination      Grip strength (lbs)   Fair   (Blank rows = not tested)        JOINT MOBILITY TESTING:  Not performed    PALPATION:  Not performed              TODAY'S TREATMENT:  12/13/21:  THEREX:   UBE L4 resistance 2.5 min forward, 2.5 min backward for warm up   Standing:    L shoulder at ~120 deg flexion, ball rotations CW, CCW: 3x30 sec/direction    L shoulder gentle overhead ball slams for RTC stabilization overhead. X20   L shoulder flexion Yellow body blade x25   3 # DB behind back passes for shoulder IR and extension: 2x20 CW and CCW    Seated:    L shoulder 90 deg abduction on mat table: RTB,  L Shoulder ER/IR, 3x10  Prone exercise:    LUE Y's 3# DB, 3x10    LUE shoulder extension: 3# DB, 3x10   L shoulder T row: 5# DB, 3x10   PATIENT  EDUCATION: Education details: form and technique for appropriate exercise and explanation of plan of care.  Person educated: Patient Education method: Customer service manager Education comprehension: verbalized understanding and returned demonstration     HOME EXERCISE PROGRAM: Access Code: [email or web address] URL: [email or web address] Date: 11/29/2021 Prepared by: Bradly Chris  Exercises - Seated Gentle Upper Trapezius Stretch  - 1 x daily - 3 reps - 30 hold - Seated Rhomboid Stretch  - 1 x daily - 3 reps - 60 sec hold - Seated Shoulder Scaption AAROM with Pulley at Side  - 1 x daily - 7 x weekly - 3 sets - 10 reps - Standing Shoulder Flexion with Resistance  - 3 x weekly - 3 sets - 8 reps - Seated Shoulder External Rotation PROM on Table  - 1 x daily - 3 reps - 60 sec  hold - Standing Shoulder Abduction Finger Walk at Wall  - 3 x weekly - 3 sets - 10 reps - Prone Shoulder Horizontal Abduction  - 3 x weekly - 3 sets - 10 reps - Prone Shoulder Extension with Dumbbells  - 3 x weekly - 3 sets - 10 reps - Prone Shoulder Row  - 3 x weekly - 3 sets - 10 reps - Prone Single Arm Shoulder Y  - 3 x weekly - 3 sets - 10 reps - Single Arm Serratus Punches in Supine with Dumbbell  - 3 x weekly - 3 sets - 10 reps  ASSESSMENT:   CLINICAL IMPRESSION: Continuing PT POC  with further focus on standing with focus on progressive L shoulder strengthening. Pt further progressing in increased reps and resistance. Progressing to 90 deg abduction with ER/IR rotator cuff strengthening. Pt does demonstrate increased weakness with shoulder abducted to 90 deg compared to shoulder in neutral positioning. Pt will continue to benefit from progressive strengthening POC as able to address remaining deficits to address all goals prior to discharge.   OBJECTIVE IMPAIRMENTS decreased ROM, decreased strength, impaired UE functional use, and pain.    ACTIVITY LIMITATIONS lifting, dressing, reach over head,  and hygiene/grooming   PARTICIPATION LIMITATIONS: cleaning, laundry, driving, and shopping   PERSONAL FACTORS  Diabetes mellitus  are also affecting patient's functional outcome.    REHAB POTENTIAL: Good   CLINICAL DECISION MAKING: Stable/uncomplicated   EVALUATION COMPLEXITY: Low     GOALS: Goals reviewed with patient? No   SHORT TERM GOALS: Target date: 09/14/2021     Pt will be independent with HEP in order to improve strength and balance in order to decrease fall risk and improve function at home and work. Baseline:  Doing exercises independently  Goal status: Achieved    2.  Patient will be able to remove left arm from sling for increased mobility and as a sign of healing.  Baseline:  Currently not wearing sling  Goal status: Achieved    3.  Patient will achieve full PROM of left shoulder as a sign of successful healing and to progress to week 4-10 of protocol.  Baseline: Shoulder Flex R/L 180/ PROM70, Shoulder Abd R/L 180/ PROM 80, Shoulder ER R/L 90/ PROM 30 11/07/21: Shoulder Flex R/L AROM 160/160, Shoulder Abd AROM R/L 160/90 , Shoulder Abd L PROM 160  11/24/21:  Left Shoulder Abduction 160 Goal status: Partially Achieved  LONG TERM GOALS: Target date: 11/09/2021  (Remove Blue Hyperlink)   Patient will have improved function and activity level as evidenced by an increase in FOTO score by 10 points or more.  Baseline: 23/100 with target of 100  11/09/21: 59/100  11/29/21: 56/100 Goal status: Achieved    2.  Patient will exhibit symmetrical AROM between right and left shoulder to perform reaching, lifting and overhead tasks to carry out ADLs. Baseline: Shoulder Flex R/L 180/ PROM70, Shoulder Abd R/L 180/ PROM 80, Shoulder ER R/L 90/ PROM 30  10/10/21: Shoulder Flex R/L 120/30 (120 on LUE with PROM), Shoulder Abduction AROM R/L 120/80,  Shoulder ER AROM R/L 90/40 11/07/21: Shoulder Flex R/L AROM 160/160, Shoulder Abd AROM R/L 160/120 , Shoulder Abd L PROM 160 11/24/21:  Shoulder Abd AROM R/L 160/160 (Need for consistency)  Goal status: Partially Achieved    3.  Patient will exhibit symmetrical shoulder strength between right and left shoulder perform reaching, lifting and overhead tasks to carry out ADLs. Baseline: Unable to perform many tasks 11/24/21: Unable to reach bra strap through internal rotation and abduct in non scaption plane  Goal status: Partially achieved      PLAN: PT FREQUENCY: 2x/week   PT DURATION: 10 weeks   PLANNED INTERVENTIONS: Therapeutic exercises, Neuromuscular re-education, Patient/Family education, Self Care, Joint mobilization, Joint manipulation, DME instructions, Dry Needling, Electrical stimulation, Cryotherapy, Moist heat, scar mobilization, Manual therapy, and Re-evaluation   PLAN FOR NEXT SESSION: Progress to shoulder flexion and abduction with increased resistance and ROM. Suboccipital release and upper trap myofascial release   Salem Caster. Fairly IV, PT, DPT Physical Therapist- [facility]  12/13/21, 5:55 PM

## 2021-12-15 ENCOUNTER — Ambulatory Visit: Payer: Managed Care, Other (non HMO) | Admitting: Physical Therapy

## 2021-12-15 ENCOUNTER — Ambulatory Visit (INDEPENDENT_AMBULATORY_CARE_PROVIDER_SITE_OTHER): Payer: Managed Care, Other (non HMO)

## 2021-12-15 DIAGNOSIS — M79622 Pain in left upper arm: Secondary | ICD-10-CM | POA: Diagnosis not present

## 2021-12-15 DIAGNOSIS — M6281 Muscle weakness (generalized): Secondary | ICD-10-CM

## 2021-12-15 DIAGNOSIS — Z9889 Other specified postprocedural states: Secondary | ICD-10-CM

## 2021-12-15 DIAGNOSIS — D485 Neoplasm of uncertain behavior of skin: Secondary | ICD-10-CM

## 2021-12-15 DIAGNOSIS — Z4802 Encounter for removal of sutures: Secondary | ICD-10-CM

## 2021-12-15 NOTE — Progress Notes (Signed)
   Follow-Up Visit   Subjective  Tina Stephenson is a 66 y.o. female who presents for the following: Two day dressing change  The following portions of the chart were reviewed this encounter and updated as appropriate:      Review of Systems: No other skin or systemic complaints.  Objective  Well appearing patient in no apparent distress; mood and affect are within normal limits.  A focused examination was performed including Left upper back. Relevant physical exam findings are noted in the Assessment and Plan.  Left Upper Back Healing excision site.    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 Pressure dressing removed, area cleaned with Puracyn, applied mupirocin ointment with clean telfa and tegaderm.     Return as scheduled.  Johnsie Kindred, RMA

## 2021-12-15 NOTE — Patient Instructions (Signed)
Due to recent changes in healthcare laws, you may see results of your pathology and/or laboratory studies on MyChart before the doctors have had a chance to review them. We understand that in some cases there may be results that are confusing or concerning to you. Please understand that not all results are received at the same time and often the doctors may need to interpret multiple results in order to provide you with the best plan of care or course of treatment. Therefore, we ask that you please give us 2 business days to thoroughly review all your results before contacting the office for clarification. Should we see a critical lab result, you will be contacted sooner.   If You Need Anything After Your Visit  If you have any questions or concerns for your doctor, please call our main line at [phone] and press option 4 to reach your doctor's medical assistant. If no one answers, please leave a voicemail as directed and we will return your call as soon as possible. Messages left after 4 pm will be answered the following business day.   You may also send us a message via MyChart. We typically respond to MyChart messages within 1-2 business days.  For prescription refills, please ask your pharmacy to contact our office. Our fax number is [phone].  If you have an urgent issue when the clinic is closed that cannot wait until the next business day, you can page your doctor at the number below.    Please note that while we do our best to be available for urgent issues outside of office hours, we are not available 24/7.   If you have an urgent issue and are unable to reach us, you may choose to seek medical care at your doctor's office, retail clinic, urgent care center, or emergency room.  If you have a medical emergency, please immediately call 911 or go to the emergency department.  Pager Numbers  - Dr. Kowalski: [phone]  - Dr. Moye: [phone]  - Dr. Stewart:  [phone]  In the event of inclement weather, please call our main line at [phone] for an update on the status of any delays or closures.  Dermatology Medication Tips: Please keep the boxes that topical medications come in in order to help keep track of the instructions about where and how to use these. Pharmacies typically print the medication instructions only on the boxes and not directly on the medication tubes.   If your medication is too expensive, please contact our office at [phone] option 4 or send us a message through MyChart.   We are unable to tell what your co-pay for medications will be in advance as this is different depending on your insurance coverage. However, we may be able to find a substitute medication at lower cost or fill out paperwork to get insurance to cover a needed medication.   If a prior authorization is required to get your medication covered by your insurance company, please allow us 1-2 business days to complete this process.  Drug prices often vary depending on where the prescription is filled and some pharmacies may offer cheaper prices.  The website www.goodrx.com contains coupons for medications through different pharmacies. The prices here do not account for what the cost may be with help from insurance (it may be cheaper with your insurance), but the website can give you the price if you did not use any insurance.  - You can print the associated coupon and take it with   your prescription to the pharmacy.  - You may also stop by our office during regular business hours and pick up a GoodRx coupon card.  - If you need your prescription sent electronically to a different pharmacy, notify our office through [place] MyChart or by phone at [phone] option 4.     Si Usted Necesita Algo Despus de Su Visita  Tambin puede enviarnos un mensaje a travs de MyChart. Por lo general respondemos a los mensajes de MyChart en el transcurso de 1 a 2  das hbiles.  Para renovar recetas, por favor pida a su farmacia que se ponga en contacto con nuestra oficina. Nuestro nmero de fax es el [phone].  Si tiene un asunto urgente cuando la clnica est cerrada y que no puede esperar hasta el siguiente da hbil, puede llamar/localizar a su doctor(a) al nmero que aparece a continuacin.   Por favor, tenga en cuenta que aunque hacemos todo lo posible para estar disponibles para asuntos urgentes fuera del horario de oficina, no estamos disponibles las 24 horas del da, los 7 das de la semana.   Si tiene un problema urgente y no puede comunicarse con nosotros, puede optar por buscar atencin mdica  en el consultorio de su doctor(a), en una clnica privada, en un centro de atencin urgente o en una sala de emergencias.  Si tiene una emergencia mdica, por favor llame inmediatamente al 911 o vaya a la sala de emergencias.  Nmeros de bper  - Dr. Kowalski: [phone]  - Dra. Moye: [phone]  - Dra. Stewart: [phone]  En caso de inclemencias del tiempo, por favor llame a nuestra lnea principal al [phone] para una actualizacin sobre el estado de cualquier retraso o cierre.  Consejos para la medicacin en dermatologa: Por favor, guarde las cajas en las que vienen los medicamentos de uso tpico para ayudarle a seguir las instrucciones sobre dnde y cmo usarlos. Las farmacias generalmente imprimen las instrucciones del medicamento slo en las cajas y no directamente en los tubos del medicamento.   Si su medicamento es muy caro, por favor, pngase en contacto con nuestra oficina llamando al [phone] y presione la opcin 4 o envenos un mensaje a travs de MyChart.   No podemos decirle cul ser su copago por los medicamentos por adelantado ya que esto es diferente dependiendo de la cobertura de su seguro. Sin embargo, es posible que podamos encontrar un medicamento sustituto a menor costo o llenar un formulario para que el  seguro cubra el medicamento que se considera necesario.   Si se requiere una autorizacin previa para que su compaa de seguros cubra su medicamento, por favor permtanos de 1 a 2 das hbiles para completar este proceso.  Los precios de los medicamentos varan con frecuencia dependiendo del lugar de dnde se surte la receta y alguna farmacias pueden ofrecer precios ms baratos.  El sitio web www.goodrx.com tiene cupones para medicamentos de diferentes farmacias. Los precios aqu no tienen en cuenta lo que podra costar con la ayuda del seguro (puede ser ms barato con su seguro), pero el sitio web puede darle el precio si no utiliz ningn seguro.  - Puede imprimir el cupn correspondiente y llevarlo con su receta a la farmacia.  - Tambin puede pasar por nuestra oficina durante el horario de atencin regular y recoger una tarjeta de cupones de GoodRx.  - Si necesita que su receta se enve electrnicamente a una farmacia diferente, informe a nuestra oficina a travs de MyChart de    o por telfono llamando al [phone] y presione la opcin 4.  

## 2021-12-15 NOTE — Therapy (Signed)
OUTPATIENT PHYSICAL THERAPY TREATMENT NOTE     Patient Name: Tina Stephenson MRN: [id number] DOB:[birth year], 66 y.o., female Today's Date: 12/15/2021  PCP: Delsa Grana PA-C  REFERRING PROVIDER: Dwana Melena PA-C  END OF SESSION:   PT End of Session - 12/15/21 1557     Visit Number 25    Number of Visits 40    Date for PT Re-Evaluation 01/10/22    Authorization Type [employer] [id number]    Authorization Time Period 11/10/21-01/10/22    Authorization - Number of Visits 40    Progress Note Due on Visit 20    PT Start Time 1548    PT Stop Time 1630    PT Time Calculation (min) 42 min    Activity Tolerance Patient tolerated treatment well    Behavior During Therapy [facility] for tasks assessed/performed                Past Medical History:  Diagnosis Date   Bronchitis    Bruised toe 12/20/2020   Cervical polyp 08/22/2018   Depression    Diabetes mellitus without complication ([place])    Gallbladder polyp    GERD (gastroesophageal reflux disease)    History of cyst of breast 03/25/2018   Had removal of 3 cysts of breasts in approx 2008-2012.   Hypertension    Hypertensive disorder 03/05/2015   Liver tumor (benign)    Nocturia 08/22/2018   Nontraumatic complete tear of left rotator cuff 08/15/2021   Nontraumatic complete tear of right rotator cuff 07/29/2021   Sacrum and coccyx fracture ([place])    Sacrum and coccyx fracture, sequela 02/21/2018   Occurred in [place] at age 59, still have residual in bilateral low back.   Sciatica    Tendinopathy of right biceps tendon 09/24/2020   Traumatic tear of supraspinatus tendon of left shoulder 09/24/2020   Traumatic tear of supraspinatus tendon of right shoulder 09/24/2020   Past Surgical History:  Procedure Laterality Date   APPENDECTOMY     NASAL SEPTUM SURGERY     VASECTOMY     WISDOM TOOTH EXTRACTION     Patient Active Problem List   Diagnosis Date Noted   Low back pain 11/07/2021   Class 2 obesity with body mass index (BMI) of 38.0 to  38.9 in adult 11/07/2021   Leg swelling 08/02/2020   Dyslipidemia 01/05/2020   Type 2 diabetes mellitus without complication, without long-term current use of insulin ([place]) 08/22/2018   Scoliosis 03/25/2018   Onychomycosis 02/21/2018   Vitamin D deficiency 03/19/2015   Essential hypertension 03/05/2015    REFERRING DIAG: Z98.890 (ICD-10-CM) - H/O repair of rotator cuff  THERAPY DIAG:  Pain in left upper arm  S/P left rotator cuff repair  Muscle weakness (generalized)  Rationale for Evaluation and Treatment Rehabilitation  PERTINENT HISTORY: S/p left rtc repair and subacrominal decompression on 08/18/21. H/o same surgery on right shoulder several years ago with successful recovery. She has been icing repeatedly throughout the day for pain and swelling management.   PRECAUTIONS:   SUBJECTIVE: Pt reports 10/10 pain after last session but due to cyst removal earlier that day mixed with PT. No pain currently.   PAIN:  N/A    OBJECTIVE: (objective measures completed at initial evaluation unless otherwise dated)  VITALS: BP 125/67 HR 80 SpO2 100     DIAGNOSTIC FINDINGS:  CLINICAL DATA:  Shoulder pain   EXAM: RIGHT SHOULDER - 3 VIEW   COMPARISON:  None.   FINDINGS: There is no evidence of  fracture or dislocation. Mild degenerative changes of the acromioclavicular joint. Bone anchors of the proximal humerus. Soft tissues are unremarkable.   IMPRESSION: No acute osseous abnormality.     Electronically Signed   By: Yetta Glassman M.D.   On: 02/15/2021 12:15     PATIENT SURVEYS:  FOTO 25/100 with target of 53   11/29/21: 56/100   COGNITION: Overall cognitive status: Within functional limits for tasks assessed                                  SENSATION: WFL   POSTURE: Normal posture    UPPER EXTREMITY ROM:      Cervical: Not performed during eval AROM  Flex    45 Ext      45  Lat Side Bend R/L 45/45 Rotation       R/L     60/60       Active ROM  Right eval Left Eval/ All PROM Left 11/09/21 Left  11/22/21 A/PROM  Shoulder flexion 180 70* 160 160  Shoulder extension        Shoulder abduction 180  80* 120* 120/160  Shoulder adduction        Shoulder internal rotation 70 70    Shoulder external rotation 90 30* 60*   Elbow flexion 150 150    Elbow extension 60 60    Wrist flexion 80 80    Wrist extension 70 70    Wrist ulnar deviation 30 30    Wrist radial deviation 20 20    Wrist pronation        Wrist supination        (Blank rows = not tested)             UPPER EXTREMITY MMT:   MMT Left  10/05/21  Right eval  Shoulder flexion 3-     Shoulder extension      Shoulder abduction  3+    Shoulder adduction      Shoulder internal rotation  3    Shoulder external rotation 3+     Middle trapezius      Lower trapezius      Elbow flexion      Elbow extension      Wrist flexion  4+  Wrist extension   4+  Wrist ulnar deviation   4+  Wrist radial deviation   4+  Wrist pronation      Wrist supination      Grip strength (lbs)   Fair   (Blank rows = not tested)     JOINT MOBILITY TESTING:  Not performed    PALPATION:  Not performed              TODAY'S TREATMENT:  12/13/21:  THEREX:   UBE L4 resistance 2.5 min forward, 2.5 min backward for warm up    Standing: AAROM with long PVC pipe at end ranges of L shoulder flexion and abduction: 2x10/plane   L shoulder at ~120 deg flexion, ball rotations CW, CCW: 3x30 sec/direction   3 # DB behind back passes for shoulder IR and extension: 2x20 CW and CCW   Seated:  L shoulder rhythmic stabilizations: 4x30 sec at distal elbow for RTC stabilization  L shoulder 90 deg abduction on mat table: RTB, L Shoulder ER/IR, 3x10   PATIENT EDUCATION: Education details: form and technique for appropriate exercise and explanation of plan of care.  Person  educated: Patient Education method: Customer service manager Education comprehension: verbalized understanding and  returned demonstration     HOME EXERCISE PROGRAM: Access Code: [email or web address] URL: [email or web address] Date: 11/29/2021 Prepared by: Bradly Chris  Exercises - Seated Gentle Upper Trapezius Stretch  - 1 x daily - 3 reps - 30 hold - Seated Rhomboid Stretch  - 1 x daily - 3 reps - 60 sec hold - Seated Shoulder Scaption AAROM with Pulley at Side  - 1 x daily - 7 x weekly - 3 sets - 10 reps - Standing Shoulder Flexion with Resistance  - 3 x weekly - 3 sets - 8 reps - Seated Shoulder External Rotation PROM on Table  - 1 x daily - 3 reps - 60 sec  hold - Standing Shoulder Abduction Finger Walk at Wall  - 3 x weekly - 3 sets - 10 reps - Prone Shoulder Horizontal Abduction  - 3 x weekly - 3 sets - 10 reps - Prone Shoulder Extension with Dumbbells  - 3 x weekly - 3 sets - 10 reps - Prone Shoulder Row  - 3 x weekly - 3 sets - 10 reps - Prone Single Arm Shoulder Y  - 3 x weekly - 3 sets - 10 reps - Single Arm Serratus Punches in Supine with Dumbbell  - 3 x weekly - 3 sets - 10 reps  ASSESSMENT:   CLINICAL IMPRESSION: Lighter session performed this date due to reports of healing from cyst removal to prevent another onset of severe pain. Pt tolerating therex targeted at improving L shoulder flexion, abduction, extension, IR/ER and periscapular strengthening without incident. Pt will continue to benefit from progressive strengthening POC as able to address remaining deficits to address all goals prior to discharge.  OBJECTIVE IMPAIRMENTS decreased ROM, decreased strength, impaired UE functional use, and pain.    ACTIVITY LIMITATIONS lifting, dressing, reach over head, and hygiene/grooming   PARTICIPATION LIMITATIONS: cleaning, laundry, driving, and shopping   PERSONAL FACTORS  Diabetes mellitus  are also affecting patient's functional outcome.    REHAB POTENTIAL: Good   CLINICAL DECISION MAKING: Stable/uncomplicated   EVALUATION COMPLEXITY: Low     GOALS: Goals reviewed with  patient? No   SHORT TERM GOALS: Target date: 09/14/2021     Pt will be independent with HEP in order to improve strength and balance in order to decrease fall risk and improve function at home and work. Baseline:  Doing exercises independently  Goal status: Achieved    2.  Patient will be able to remove left arm from sling for increased mobility and as a sign of healing.  Baseline:  Currently not wearing sling  Goal status: Achieved    3.  Patient will achieve full PROM of left shoulder as a sign of successful healing and to progress to week 4-10 of protocol.  Baseline: Shoulder Flex R/L 180/ PROM70, Shoulder Abd R/L 180/ PROM 80, Shoulder ER R/L 90/ PROM 30 11/07/21: Shoulder Flex R/L AROM 160/160, Shoulder Abd AROM R/L 160/90 , Shoulder Abd L PROM 160  11/24/21:  Left Shoulder Abduction 160 Goal status: Partially Achieved        LONG TERM GOALS: Target date: 11/09/2021  (Remove Blue Hyperlink)   Patient will have improved function and activity level as evidenced by an increase in FOTO score by 10 points or more.  Baseline: 23/100 with target of 100  11/09/21: 59/100  11/29/21: 56/100 Goal status: Achieved    2.  Patient will exhibit symmetrical AROM between right and  left shoulder to perform reaching, lifting and overhead tasks to carry out ADLs. Baseline: Shoulder Flex R/L 180/ PROM70, Shoulder Abd R/L 180/ PROM 80, Shoulder ER R/L 90/ PROM 30  10/10/21: Shoulder Flex R/L 120/30 (120 on LUE with PROM), Shoulder Abduction AROM R/L 120/80,  Shoulder ER AROM R/L 90/40 11/07/21: Shoulder Flex R/L AROM 160/160, Shoulder Abd AROM R/L 160/120 , Shoulder Abd L PROM 160 11/24/21: Shoulder Abd AROM R/L 160/160 (Need for consistency)  Goal status: Partially Achieved    3.  Patient will exhibit symmetrical shoulder strength between right and left shoulder perform reaching, lifting and overhead tasks to carry out ADLs. Baseline: Unable to perform many tasks 11/24/21: Unable to reach bra strap through  internal rotation and abduct in non scaption plane  Goal status: Partially achieved      PLAN: PT FREQUENCY: 2x/week   PT DURATION: 10 weeks   PLANNED INTERVENTIONS: Therapeutic exercises, Neuromuscular re-education, Patient/Family education, Self Care, Joint mobilization, Joint manipulation, DME instructions, Dry Needling, Electrical stimulation, Cryotherapy, Moist heat, scar mobilization, Manual therapy, and Re-evaluation   PLAN FOR NEXT SESSION: Progress to shoulder flexion and abduction with increased resistance and ROM. Suboccipital release and upper trap myofascial release   Salem Caster. Fairly IV, PT, DPT Physical Therapist- [facility]  12/15/21, 4:38 PM

## 2021-12-15 NOTE — Progress Notes (Signed)
   Follow-Up Visit   Subjective  Tina Stephenson is a 66 y.o. female who presents for the following: Cyst (Vs other of left upper back - excise today).  The following portions of the chart were reviewed this encounter and updated as appropriate:   Tobacco  Allergies  Meds  Problems  Med Hx  Surg Hx  Fam Hx     Review of Systems:  No other skin or systemic complaints except as noted in HPI or Assessment and Plan.  Objective  Well appearing patient in no apparent distress; mood and affect are within normal limits.  A focused examination was performed including back. Relevant physical exam findings are noted in the Assessment and Plan.  Left Upper Back 2.2 cm cystic papule  Right neck Fleshy, skin-colored pedunculated papules.     Assessment & Plan  Neoplasm of uncertain behavior of skin Left Upper Back Skin excision  Lesion length (cm):  2.2 Lesion width (cm):  2.2 Margin per side (cm):  0 Total excision diameter (cm):  2.2 Informed consent: discussed and consent obtained   Timeout: patient name, date of birth, surgical site, and procedure verified   Procedure prep:  Patient was prepped and draped in usual sterile fashion Prep type:  Isopropyl alcohol and povidone-iodine Anesthesia: the lesion was anesthetized in a standard fashion   Anesthetic:  1% lidocaine w/ epinephrine 1-100,000 buffered w/ 8.4% NaHCO3 Instrument used: #15 blade   Hemostasis achieved with: pressure   Hemostasis achieved with comment:  Electrocautery Outcome: patient tolerated procedure well with no complications   Post-procedure details: sterile dressing applied and wound care instructions given   Dressing type: bandage and pressure dressing (mupirocin)    Skin repair Complexity:  Complex Final length (cm):  3 Reason for type of repair: reduce tension to allow closure, reduce the risk of dehiscence, infection, and necrosis, reduce subcutaneous dead space and avoid a hematoma, allow closure of  the large defect, preserve normal anatomy, preserve normal anatomical and functional relationships and enhance both functionality and cosmetic results   Undermining: area extensively undermined   Undermining comment:  Undermining defect 2.2 cm Subcutaneous layers (deep stitches):  Suture size:  2-0 Suture type: Vicryl (polyglactin 910)   Subcutaneous suture technique: inverted dermal. Fine/surface layer approximation (top stitches):  Suture size:  2-0 Suture type: nylon   Stitches: simple running   Suture removal (days):  7 Hemostasis achieved with: suture and pressure Outcome: patient tolerated procedure well with no complications   Post-procedure details: sterile dressing applied and wound care instructions given   Dressing type: bandage and pressure dressing (mupirocin)    Related Procedures Anatomic Pathology Report  Skin tag Right neck Will plan snip excision on follow up  Return in about 1 week (around 12/20/2021).  I, Ashok Cordia, CMA, am acting as scribe for Sarina Ser, MD . Documentation: I have reviewed the above documentation for accuracy and completeness, and I agree with the above.  Sarina Ser, MD

## 2021-12-16 ENCOUNTER — Encounter: Payer: Self-pay | Admitting: Dermatology

## 2021-12-20 ENCOUNTER — Ambulatory Visit: Payer: Managed Care, Other (non HMO) | Admitting: Dermatology

## 2021-12-20 ENCOUNTER — Ambulatory Visit: Payer: Managed Care, Other (non HMO)

## 2021-12-20 DIAGNOSIS — M6281 Muscle weakness (generalized): Secondary | ICD-10-CM

## 2021-12-20 DIAGNOSIS — D225 Melanocytic nevi of trunk: Secondary | ICD-10-CM | POA: Diagnosis not present

## 2021-12-20 DIAGNOSIS — L700 Acne vulgaris: Secondary | ICD-10-CM | POA: Diagnosis not present

## 2021-12-20 DIAGNOSIS — D229 Melanocytic nevi, unspecified: Secondary | ICD-10-CM

## 2021-12-20 DIAGNOSIS — M79622 Pain in left upper arm: Secondary | ICD-10-CM | POA: Diagnosis not present

## 2021-12-20 DIAGNOSIS — D492 Neoplasm of unspecified behavior of bone, soft tissue, and skin: Secondary | ICD-10-CM

## 2021-12-20 DIAGNOSIS — L720 Epidermal cyst: Secondary | ICD-10-CM | POA: Diagnosis not present

## 2021-12-20 DIAGNOSIS — Z9889 Other specified postprocedural states: Secondary | ICD-10-CM

## 2021-12-20 LAB — ANATOMIC PATHOLOGY REPORT

## 2021-12-20 NOTE — Therapy (Signed)
OUTPATIENT PHYSICAL THERAPY TREATMENT NOTE     Patient Name: Tina Stephenson MRN: [id number] DOB:10-21-1955, 66 y.o., female Today's Date: 12/20/2021  PCP: Delsa Grana PA-C  REFERRING PROVIDER: Dwana Melena PA-C  END OF SESSION:   PT End of Session - 12/20/21 1722     Visit Number 26    Number of Visits 40    Date for PT Re-Evaluation 01/10/22    Authorization Type [employer] 2023    Authorization Time Period 11/10/21-01/10/22    Authorization - Number of Visits 40    Progress Note Due on Visit 20    PT Start Time 1716    PT Stop Time 1800    PT Time Calculation (min) 44 min    Activity Tolerance Patient tolerated treatment well    Behavior During Therapy [facility] for tasks assessed/performed                Past Medical History:  Diagnosis Date   Bronchitis    Bruised toe 12/20/2020   Cervical polyp 08/22/2018   Depression    Diabetes mellitus without complication ([place])    Gallbladder polyp    GERD (gastroesophageal reflux disease)    History of cyst of breast 03/25/2018   Had removal of 3 cysts of breasts in approx 2008-2012.   Hypertension    Hypertensive disorder 03/05/2015   Liver tumor (benign)    Nocturia 08/22/2018   Nontraumatic complete tear of left rotator cuff 08/15/2021   Nontraumatic complete tear of right rotator cuff 07/29/2021   Sacrum and coccyx fracture ([place])    Sacrum and coccyx fracture, sequela 02/21/2018   Occurred in [place] at age 54, still have residual in bilateral low back.   Sciatica    Tendinopathy of right biceps tendon 09/24/2020   Traumatic tear of supraspinatus tendon of left shoulder 09/24/2020   Traumatic tear of supraspinatus tendon of right shoulder 09/24/2020   Past Surgical History:  Procedure Laterality Date   APPENDECTOMY     NASAL SEPTUM SURGERY     VASECTOMY     WISDOM TOOTH EXTRACTION     Patient Active Problem List   Diagnosis Date Noted   Low back pain 11/07/2021   Class 2 obesity with body mass index (BMI) of 38.0 to  38.9 in adult 11/07/2021   Leg swelling 08/02/2020   Dyslipidemia 01/05/2020   Type 2 diabetes mellitus without complication, without long-term current use of insulin ([place]) 08/22/2018   Scoliosis 03/25/2018   Onychomycosis 02/21/2018   Vitamin D deficiency 03/19/2015   Essential hypertension 03/05/2015    REFERRING DIAG: Z98.890 (ICD-10-CM) - H/O repair of rotator cuff  THERAPY DIAG:  Pain in left upper arm  S/P left rotator cuff repair  Muscle weakness (generalized)  Rationale for Evaluation and Treatment Rehabilitation  PERTINENT HISTORY: S/p left rtc repair and subacrominal decompression on 08/18/21. H/o same surgery on right shoulder several years ago with successful recovery. She has been icing repeatedly throughout the day for pain and swelling management.   PRECAUTIONS:   SUBJECTIVE: Pt reports minor pain primarily soreness due to having another cyst removal. Reports modifying session was helpful last week. R shoulder in a lot of pain, she is unsure why.   PAIN:  N/A    OBJECTIVE: (objective measures completed at initial evaluation unless otherwise dated)  VITALS: BP 125/67 HR 80 SpO2 100     DIAGNOSTIC FINDINGS:  CLINICAL DATA:  Shoulder pain   EXAM: RIGHT SHOULDER - 3 VIEW   COMPARISON:  None.   FINDINGS: There is no evidence of fracture or dislocation. Mild degenerative changes of the acromioclavicular joint. Bone anchors of the proximal humerus. Soft tissues are unremarkable.   IMPRESSION: No acute osseous abnormality.     Electronically Signed   By: Yetta Glassman M.D.   On: 02/15/2021 12:15     PATIENT SURVEYS:  FOTO 25/100 with target of 53   11/29/21: 56/100   COGNITION: Overall cognitive status: Within functional limits for tasks assessed                                  SENSATION: WFL   POSTURE: Normal posture    UPPER EXTREMITY ROM:      Cervical: Not performed during eval AROM  Flex    45 Ext      45  Lat Side Bend R/L  45/45 Rotation       R/L     60/60       Active ROM Right eval Left Eval/ All PROM Left 11/09/21 Left  11/22/21 A/PROM  Shoulder flexion 180 70* 160 160  Shoulder extension        Shoulder abduction 180  80* 120* 120/160  Shoulder adduction        Shoulder internal rotation 70 70    Shoulder external rotation 90 30* 60*   Elbow flexion 150 150    Elbow extension 60 60    Wrist flexion 80 80    Wrist extension 70 70    Wrist ulnar deviation 30 30    Wrist radial deviation 20 20    Wrist pronation        Wrist supination        (Blank rows = not tested)             UPPER EXTREMITY MMT:   MMT Left  10/05/21  Right eval  Shoulder flexion 3-     Shoulder extension      Shoulder abduction  3+    Shoulder adduction      Shoulder internal rotation  3    Shoulder external rotation 3+     Middle trapezius      Lower trapezius      Elbow flexion      Elbow extension      Wrist flexion  4+  Wrist extension   4+  Wrist ulnar deviation   4+  Wrist radial deviation   4+  Wrist pronation      Wrist supination      Grip strength (lbs)   Fair   (Blank rows = not tested)     JOINT MOBILITY TESTING:  Not performed    PALPATION:  Not performed              TODAY'S TREATMENT:  12/20/21:  THEREX:   UBE L4 resistance 2.5 min forward, 2.5 min backward for warm up    Standing: AAROM with long PVC pipe at end ranges of L shoulder flexion and abduction: 2x10/plane  L shoulder flexion and abduction: 2x10/plane  L shoulder at ~120 deg flexion, ball rotations CW, CCW: 3x30 sec/direction   3 # DB behind back passes for shoulder IR and extension: 1x20 CW and CCW   Seated:  L shoulder rhythmic stabilizations in flexion and abduction: 3x30 sec at distal elbow for RTC stabilization  L shoulder 90 deg abduction on mat table: GTB, L Shoulder ER/IR, 3x8  PATIENT EDUCATION: Education details: form and technique for appropriate exercise and explanation of plan of care.   Person educated: Patient Education method: Customer service manager Education comprehension: verbalized understanding and returned demonstration     HOME EXERCISE PROGRAM: Access Code: [email or web address] URL: [email or web address] Date: 11/29/2021 Prepared by: Bradly Chris  Exercises - Seated Gentle Upper Trapezius Stretch  - 1 x daily - 3 reps - 30 hold - Seated Rhomboid Stretch  - 1 x daily - 3 reps - 60 sec hold - Seated Shoulder Scaption AAROM with Pulley at Side  - 1 x daily - 7 x weekly - 3 sets - 10 reps - Standing Shoulder Flexion with Resistance  - 3 x weekly - 3 sets - 8 reps - Seated Shoulder External Rotation PROM on Table  - 1 x daily - 3 reps - 60 sec  hold - Standing Shoulder Abduction Finger Walk at Wall  - 3 x weekly - 3 sets - 10 reps - Prone Shoulder Horizontal Abduction  - 3 x weekly - 3 sets - 10 reps - Prone Shoulder Extension with Dumbbells  - 3 x weekly - 3 sets - 10 reps - Prone Shoulder Row  - 3 x weekly - 3 sets - 10 reps - Prone Single Arm Shoulder Y  - 3 x weekly - 3 sets - 10 reps - Single Arm Serratus Punches in Supine with Dumbbell  - 3 x weekly - 3 sets - 10 reps  ASSESSMENT:   CLINICAL IMPRESSION: Continuing with lighter session as pt had additional cyst removal. Pt able to progress in rested RTC strengthening in 90 degrees abduction without incident or pain. Will continue progressive RTC strengthening and overhead strengthening to address remaining deficits to complete all goals prior to discharge.   OBJECTIVE IMPAIRMENTS decreased ROM, decreased strength, impaired UE functional use, and pain.    ACTIVITY LIMITATIONS lifting, dressing, reach over head, and hygiene/grooming   PARTICIPATION LIMITATIONS: cleaning, laundry, driving, and shopping   PERSONAL FACTORS  Diabetes mellitus  are also affecting patient's functional outcome.    REHAB POTENTIAL: Good   CLINICAL DECISION MAKING: Stable/uncomplicated   EVALUATION COMPLEXITY:  Low     GOALS: Goals reviewed with patient? No   SHORT TERM GOALS: Target date: 09/14/2021     Pt will be independent with HEP in order to improve strength and balance in order to decrease fall risk and improve function at home and work. Baseline:  Doing exercises independently  Goal status: Achieved    2.  Patient will be able to remove left arm from sling for increased mobility and as a sign of healing.  Baseline:  Currently not wearing sling  Goal status: Achieved    3.  Patient will achieve full PROM of left shoulder as a sign of successful healing and to progress to week 4-10 of protocol.  Baseline: Shoulder Flex R/L 180/ PROM70, Shoulder Abd R/L 180/ PROM 80, Shoulder ER R/L 90/ PROM 30 11/07/21: Shoulder Flex R/L AROM 160/160, Shoulder Abd AROM R/L 160/90 , Shoulder Abd L PROM 160  11/24/21:  Left Shoulder Abduction 160 Goal status: Partially Achieved        LONG TERM GOALS: Target date: 11/09/2021  (Remove Blue Hyperlink)   Patient will have improved function and activity level as evidenced by an increase in FOTO score by 10 points or more.  Baseline: 23/100 with target of 100  11/09/21: 59/100  11/29/21: 56/100 Goal status: Achieved    2.  Patient will exhibit  symmetrical AROM between right and left shoulder to perform reaching, lifting and overhead tasks to carry out ADLs. Baseline: Shoulder Flex R/L 180/ PROM70, Shoulder Abd R/L 180/ PROM 80, Shoulder ER R/L 90/ PROM 30  10/10/21: Shoulder Flex R/L 120/30 (120 on LUE with PROM), Shoulder Abduction AROM R/L 120/80,  Shoulder ER AROM R/L 90/40 11/07/21: Shoulder Flex R/L AROM 160/160, Shoulder Abd AROM R/L 160/120 , Shoulder Abd L PROM 160 11/24/21: Shoulder Abd AROM R/L 160/160 (Need for consistency)  Goal status: Partially Achieved    3.  Patient will exhibit symmetrical shoulder strength between right and left shoulder perform reaching, lifting and overhead tasks to carry out ADLs. Baseline: Unable to perform many tasks  11/24/21: Unable to reach bra strap through internal rotation and abduct in non scaption plane  Goal status: Partially achieved      PLAN: PT FREQUENCY: 2x/week   PT DURATION: 10 weeks   PLANNED INTERVENTIONS: Therapeutic exercises, Neuromuscular re-education, Patient/Family education, Self Care, Joint mobilization, Joint manipulation, DME instructions, Dry Needling, Electrical stimulation, Cryotherapy, Moist heat, scar mobilization, Manual therapy, and Re-evaluation   PLAN FOR NEXT SESSION: Progress to shoulder flexion and abduction with increased resistance and ROM. Suboccipital release and upper trap myofascial release   Salem Caster. Fairly IV, PT, DPT Physical Therapist- [facility]  12/20/21, 6:01 PM

## 2021-12-20 NOTE — Patient Instructions (Addendum)

## 2021-12-20 NOTE — Progress Notes (Signed)
Follow-Up Visit   Subjective  Tina Stephenson is a 66 y.o. female who presents for the following: Cyst vs other (L back above braline, pt presents for excision) and Cyst bx proven (L upper back, pt presents for suture removal). The patient has spots, moles and lesions to be evaluated, some may be new or changing and the patient has concerns that these could be cancer.  The following portions of the chart were reviewed this encounter and updated as appropriate:   Tobacco  Allergies  Meds  Problems  Med Hx  Surg Hx  Fam Hx     Review of Systems:  No other skin or systemic complaints except as noted in HPI or Assessment and Plan.  Objective  Well appearing patient in no apparent distress; mood and affect are within normal limits.  A focused examination was performed including back. Relevant physical exam findings are noted in the Assessment and Plan.  L back above braline Cystic pap 1.2cm  Left Upper Back Healing excision site  back [place] back  Upper back Slightly irregular brown macules   Assessment & Plan  Neoplasm of skin L back above braline Skin excision  Lesion length (cm):  1.2 Lesion width (cm):  1.2 Margin per side (cm):  0 Total excision diameter (cm):  1.2 Informed consent: discussed and consent obtained   Timeout: patient name, date of birth, surgical site, and procedure verified   Procedure prep:  Patient was prepped and draped in usual sterile fashion Prep type:  Isopropyl alcohol and povidone-iodine Anesthesia: the lesion was anesthetized in a standard fashion   Anesthetic:  1% lidocaine w/ epinephrine 1-100,000 buffered w/ 8.4% NaHCO3 (3cc lido w/ epi, 3cc bupivicaine, Total of 6cc) Instrument used comment:  #15c blade Hemostasis achieved with: pressure   Hemostasis achieved with comment:  Electrocautery Outcome: patient tolerated procedure well with no complications   Post-procedure details: sterile dressing applied and wound care instructions  given   Dressing type: bandage, pressure dressing and bacitracin (Mupirocin)    Skin repair Complexity:  Complex Final length (cm):  2.4 Reason for type of repair: reduce tension to allow closure, reduce the risk of dehiscence, infection, and necrosis, reduce subcutaneous dead space and avoid a hematoma, allow closure of the large defect, preserve normal anatomy, preserve normal anatomical and functional relationships and enhance both functionality and cosmetic results   Undermining: area extensively undermined   Undermining comment:  Undermining Defect 1.2cm Subcutaneous layers (deep stitches):  Suture size:  3-0 Suture type: Vicryl (polyglactin 910)   Subcutaneous suture technique: Inverted Dermal. Fine/surface layer approximation (top stitches):  Suture size:  3-0 Suture type: nylon   Stitches: horizontal mattress and simple interrupted   Stitches comment:  Horizontal mattress x 2, simple interrupted x 1 Suture removal (days):  7 Hemostasis achieved with: pressure Outcome: patient tolerated procedure well with no complications   Post-procedure details: sterile dressing applied and wound care instructions given   Dressing type: bandage, pressure dressing and bacitracin (Mupirocin)    Specimen 1 - Surgical pathology Differential Diagnosis: D48.5 Cyst vs other Check Margins: yes Cystic pap Cyst vs other, excised today Start Mupirocin oint qd Related Procedures Anatomic Pathology Report  Epidermal cyst -that is post excision 7 days ago Left Upper Back Cyst bx proven, healing well  Encounter for Removal of Sutures - Incision site at the left upper back is clean, dry and intact - Wound cleansed, sutures removed, wound cleansed and steri strips applied.  - Discussed pathology results showing  cyst  - Patient advised to keep steri-strips dry until they fall off. - Scars remodel for a full year. - Once steri-strips fall off, patient can apply over-the-counter silicone scar cream  each night to help with scar remodeling if desired. - Patient advised to call with any concerns or if they notice any new or changing lesions.   Acne vulgaris -open comedones back Discussed extraction  Nevus -irregular x 3 Upper back Plan shave removal x 3 on f/u  Return in about 1 week (around 12/27/2021) for suture removal and bx x 3 back.  I, Othelia Pulling, RMA, am acting as scribe for Sarina Ser, MD . Documentation: I have reviewed the above documentation for accuracy and completeness, and I agree with the above.  Sarina Ser, MD

## 2021-12-21 ENCOUNTER — Telehealth: Payer: Self-pay

## 2021-12-21 NOTE — Telephone Encounter (Signed)
Pt doing fine after yesterdays surgery./sh 

## 2021-12-22 ENCOUNTER — Ambulatory Visit: Payer: Managed Care, Other (non HMO)

## 2021-12-22 DIAGNOSIS — M6281 Muscle weakness (generalized): Secondary | ICD-10-CM

## 2021-12-22 DIAGNOSIS — M79622 Pain in left upper arm: Secondary | ICD-10-CM

## 2021-12-22 DIAGNOSIS — Z9889 Other specified postprocedural states: Secondary | ICD-10-CM

## 2021-12-22 NOTE — Therapy (Signed)
OUTPATIENT PHYSICAL THERAPY TREATMENT NOTE     Patient Name: Tina Stephenson MRN: [id number] DOB:[birth year], 66 y.o., female Today's Date: 12/22/2021  PCP: Delsa Grana PA-C  REFERRING PROVIDER: Dwana Melena PA-C  END OF SESSION:   PT End of Session - 12/22/21 1725     Visit Number 27    Number of Visits 40    Date for PT Re-Evaluation 01/10/22    Authorization Type [employer] 2023    Authorization Time Period 11/10/21-01/10/22    Authorization - Number of Visits 40    Progress Note Due on Visit 20    PT Start Time 2725    PT Stop Time 1759    PT Time Calculation (min) 44 min    Activity Tolerance Patient tolerated treatment well    Behavior During Therapy [facility] for tasks assessed/performed                Past Medical History:  Diagnosis Date   Bronchitis    Bruised toe 12/20/2020   Cervical polyp 08/22/2018   Depression    Diabetes mellitus without complication ([place])    Gallbladder polyp    GERD (gastroesophageal reflux disease)    History of cyst of breast 03/25/2018   Had removal of 3 cysts of breasts in approx 2008-2012.   Hypertension    Hypertensive disorder 03/05/2015   Liver tumor (benign)    Nocturia 08/22/2018   Nontraumatic complete tear of left rotator cuff 08/15/2021   Nontraumatic complete tear of right rotator cuff 07/29/2021   Sacrum and coccyx fracture ([place])    Sacrum and coccyx fracture, sequela 02/21/2018   Occurred in [place] at age 60, still have residual in bilateral low back.   Sciatica    Tendinopathy of right biceps tendon 09/24/2020   Traumatic tear of supraspinatus tendon of left shoulder 09/24/2020   Traumatic tear of supraspinatus tendon of right shoulder 09/24/2020   Past Surgical History:  Procedure Laterality Date   APPENDECTOMY     NASAL SEPTUM SURGERY     VASECTOMY     WISDOM TOOTH EXTRACTION     Patient Active Problem List   Diagnosis Date Noted   Low back pain 11/07/2021   Class 2 obesity with body mass index (BMI) of 38.0 to  38.9 in adult 11/07/2021   Leg swelling 08/02/2020   Dyslipidemia 01/05/2020   Type 2 diabetes mellitus without complication, without long-term current use of insulin ([place]) 08/22/2018   Scoliosis 03/25/2018   Onychomycosis 02/21/2018   Vitamin D deficiency 03/19/2015   Essential hypertension 03/05/2015    REFERRING DIAG: Z98.890 (ICD-10-CM) - H/O repair of rotator cuff  THERAPY DIAG:  Pain in left upper arm  S/P left rotator cuff repair  Muscle weakness (generalized)  Rationale for Evaluation and Treatment Rehabilitation  PERTINENT HISTORY: S/p left rtc repair and subacrominal decompression on 08/18/21. H/o same surgery on right shoulder several years ago with successful recovery. She has been icing repeatedly throughout the day for pain and swelling management.   PRECAUTIONS:   SUBJECTIVE: Pt reports another flare up of neck/upper back pain after second cyst removal. Pain improving but is still moderate.   PAIN:  N/A    OBJECTIVE: (objective measures completed at initial evaluation unless otherwise dated)  VITALS: BP 125/67 HR 80 SpO2 100     DIAGNOSTIC FINDINGS:  CLINICAL DATA:  Shoulder pain   EXAM: RIGHT SHOULDER - 3 VIEW   COMPARISON:  None.   FINDINGS: There is no evidence of fracture or  dislocation. Mild degenerative changes of the acromioclavicular joint. Bone anchors of the proximal humerus. Soft tissues are unremarkable.   IMPRESSION: No acute osseous abnormality.     Electronically Signed   By: Yetta Glassman M.D.   On: 02/15/2021 12:15     PATIENT SURVEYS:  FOTO 25/100 with target of 53   11/29/21: 56/100   COGNITION: Overall cognitive status: Within functional limits for tasks assessed                                  SENSATION: WFL   POSTURE: Normal posture    UPPER EXTREMITY ROM:      Cervical: Not performed during eval AROM  Flex    45 Ext      45  Lat Side Bend R/L 45/45 Rotation       R/L     60/60       Active ROM  Right eval Left Eval/ All PROM Left 11/09/21 Left  11/22/21 A/PROM  Shoulder flexion 180 70* 160 160  Shoulder extension        Shoulder abduction 180  80* 120* 120/160  Shoulder adduction        Shoulder internal rotation 70 70    Shoulder external rotation 90 30* 60*   Elbow flexion 150 150    Elbow extension 60 60    Wrist flexion 80 80    Wrist extension 70 70    Wrist ulnar deviation 30 30    Wrist radial deviation 20 20    Wrist pronation        Wrist supination        (Blank rows = not tested)             UPPER EXTREMITY MMT:   MMT Left  10/05/21  Right eval  Shoulder flexion 3-     Shoulder extension      Shoulder abduction  3+    Shoulder adduction      Shoulder internal rotation  3    Shoulder external rotation 3+     Middle trapezius      Lower trapezius      Elbow flexion      Elbow extension      Wrist flexion  4+  Wrist extension   4+  Wrist ulnar deviation   4+  Wrist radial deviation   4+  Wrist pronation      Wrist supination      Grip strength (lbs)   Fair   (Blank rows = not tested)     JOINT MOBILITY TESTING:  Not performed    PALPATION:  Not performed              TODAY'S TREATMENT:  12/22/21:  THEREX:   UBE L4 resistance 2.5 min forward, 2.5 min backward for warm up   Seated:  L shoulder flexion and abduction: 2x20/plane  3 # DB behind back passes for shoulder IR and extension: 1x20 CW and CCW  L shoulder rhythmic stabilizations in flexion and abduction: 3x30 sec at distal elbow and at wrist level for RTC stabilization  L shoulder at 90 deg abduction on mat table: GTB, L Shoulder ER/IR, 3x8   RTB scap retractions for light periscapular activation: 2x12   PATIENT EDUCATION: Education details: form and technique for appropriate exercise and explanation of plan of care.  Person educated: Patient Education method: Customer service manager Education comprehension: verbalized  understanding and returned demonstration      HOME EXERCISE PROGRAM: Access Code: [email or web address] URL: [email or web address] Date: 11/29/2021 Prepared by: Bradly Chris  Exercises - Seated Gentle Upper Trapezius Stretch  - 1 x daily - 3 reps - 30 hold - Seated Rhomboid Stretch  - 1 x daily - 3 reps - 60 sec hold - Seated Shoulder Scaption AAROM with Pulley at Side  - 1 x daily - 7 x weekly - 3 sets - 10 reps - Standing Shoulder Flexion with Resistance  - 3 x weekly - 3 sets - 8 reps - Seated Shoulder External Rotation PROM on Table  - 1 x daily - 3 reps - 60 sec  hold - Standing Shoulder Abduction Finger Walk at Wall  - 3 x weekly - 3 sets - 10 reps - Prone Shoulder Horizontal Abduction  - 3 x weekly - 3 sets - 10 reps - Prone Shoulder Extension with Dumbbells  - 3 x weekly - 3 sets - 10 reps - Prone Shoulder Row  - 3 x weekly - 3 sets - 10 reps - Prone Single Arm Shoulder Y  - 3 x weekly - 3 sets - 10 reps - Single Arm Serratus Punches in Supine with Dumbbell  - 3 x weekly - 3 sets - 10 reps  ASSESSMENT:   CLINICAL IMPRESSION: Lighter session performed this date due to flare up of cervical/upper thoracic pain from second cyst removal. Performing all therex in seated due to also aggravated chronic knee pain. Lighter resistance performed and slight regression in exercise but maintained consistent GTB RTC strengthening without aggravation of symptoms. Encouraged pt on gentle/pain free mobility and HEP performance along with rest to recover. PT will continue progressive RTC strengthening and overhead strengthening to address remaining deficits to complete all goals prior to discharge.   OBJECTIVE IMPAIRMENTS decreased ROM, decreased strength, impaired UE functional use, and pain.    ACTIVITY LIMITATIONS lifting, dressing, reach over head, and hygiene/grooming   PARTICIPATION LIMITATIONS: cleaning, laundry, driving, and shopping   PERSONAL FACTORS  Diabetes mellitus  are also affecting patient's functional outcome.    REHAB  POTENTIAL: Good   CLINICAL DECISION MAKING: Stable/uncomplicated   EVALUATION COMPLEXITY: Low     GOALS: Goals reviewed with patient? No   SHORT TERM GOALS: Target date: 09/14/2021     Pt will be independent with HEP in order to improve strength and balance in order to decrease fall risk and improve function at home and work. Baseline:  Doing exercises independently  Goal status: Achieved    2.  Patient will be able to remove left arm from sling for increased mobility and as a sign of healing.  Baseline:  Currently not wearing sling  Goal status: Achieved    3.  Patient will achieve full PROM of left shoulder as a sign of successful healing and to progress to week 4-10 of protocol.  Baseline: Shoulder Flex R/L 180/ PROM70, Shoulder Abd R/L 180/ PROM 80, Shoulder ER R/L 90/ PROM 30 11/07/21: Shoulder Flex R/L AROM 160/160, Shoulder Abd AROM R/L 160/90 , Shoulder Abd L PROM 160  11/24/21:  Left Shoulder Abduction 160 Goal status: Partially Achieved        LONG TERM GOALS: Target date: 11/09/2021  (Remove Blue Hyperlink)   Patient will have improved function and activity level as evidenced by an increase in FOTO score by 10 points or more.  Baseline: 23/100 with target of 100  11/09/21: 59/100  11/29/21: 56/100 Goal status: Achieved  2.  Patient will exhibit symmetrical AROM between right and left shoulder to perform reaching, lifting and overhead tasks to carry out ADLs. Baseline: Shoulder Flex R/L 180/ PROM70, Shoulder Abd R/L 180/ PROM 80, Shoulder ER R/L 90/ PROM 30  10/10/21: Shoulder Flex R/L 120/30 (120 on LUE with PROM), Shoulder Abduction AROM R/L 120/80,  Shoulder ER AROM R/L 90/40 11/07/21: Shoulder Flex R/L AROM 160/160, Shoulder Abd AROM R/L 160/120 , Shoulder Abd L PROM 160 11/24/21: Shoulder Abd AROM R/L 160/160 (Need for consistency)  Goal status: Partially Achieved    3.  Patient will exhibit symmetrical shoulder strength between right and left shoulder perform  reaching, lifting and overhead tasks to carry out ADLs. Baseline: Unable to perform many tasks 11/24/21: Unable to reach bra strap through internal rotation and abduct in non scaption plane  Goal status: Partially achieved      PLAN: PT FREQUENCY: 2x/week   PT DURATION: 10 weeks   PLANNED INTERVENTIONS: Therapeutic exercises, Neuromuscular re-education, Patient/Family education, Self Care, Joint mobilization, Joint manipulation, DME instructions, Dry Needling, Electrical stimulation, Cryotherapy, Moist heat, scar mobilization, Manual therapy, and Re-evaluation   PLAN FOR NEXT SESSION: Progress to shoulder flexion and abduction with increased resistance and ROM. RTC strengthening in abducted/flexed ranges of motion to improve strength overhead. Shoulder IR/extension AROM  Salem Caster. Fairly IV, PT, DPT Physical Therapist- [facility]  12/22/21, 6:04 PM

## 2021-12-25 ENCOUNTER — Encounter: Payer: Self-pay | Admitting: Dermatology

## 2021-12-26 ENCOUNTER — Ambulatory Visit: Payer: Managed Care, Other (non HMO) | Admitting: Physical Therapy

## 2021-12-26 DIAGNOSIS — M79622 Pain in left upper arm: Secondary | ICD-10-CM

## 2021-12-26 DIAGNOSIS — Z9889 Other specified postprocedural states: Secondary | ICD-10-CM

## 2021-12-26 NOTE — Therapy (Unsigned)
OUTPATIENT PHYSICAL THERAPY TREATMENT NOTE     Patient Name: Tina Stephenson MRN: [id number] DOB:1956-01-01, 66 y.o., female Today's Date: 12/26/2021  PCP: Delsa Grana PA-C  REFERRING PROVIDER: Dwana Melena PA-C  END OF SESSION:   PT End of Session - 12/26/21 1723     Visit Number 28    Number of Visits 40    Date for PT Re-Evaluation 01/10/22    Authorization Type [employer] 2023    Authorization Time Period 11/10/21-01/10/22    Authorization - Visit Number 28    Authorization - Number of Visits 40    Progress Note Due on Visit 30    PT Start Time 4765    PT Stop Time 1800    PT Time Calculation (min) 45 min    Activity Tolerance Patient tolerated treatment well    Behavior During Therapy [facility] for tasks assessed/performed                 Past Medical History:  Diagnosis Date   Bronchitis    Bruised toe 12/20/2020   Cervical polyp 08/22/2018   Depression    Diabetes mellitus without complication ([place])    Gallbladder polyp    GERD (gastroesophageal reflux disease)    History of cyst of breast 03/25/2018   Had removal of 3 cysts of breasts in approx 2008-2012.   Hypertension    Hypertensive disorder 03/05/2015   Liver tumor (benign)    Nocturia 08/22/2018   Nontraumatic complete tear of left rotator cuff 08/15/2021   Nontraumatic complete tear of right rotator cuff 07/29/2021   Sacrum and coccyx fracture ([place])    Sacrum and coccyx fracture, sequela 02/21/2018   Occurred in [place] at age 36, still have residual in bilateral low back.   Sciatica    Tendinopathy of right biceps tendon 09/24/2020   Traumatic tear of supraspinatus tendon of left shoulder 09/24/2020   Traumatic tear of supraspinatus tendon of right shoulder 09/24/2020   Past Surgical History:  Procedure Laterality Date   APPENDECTOMY     NASAL SEPTUM SURGERY     VASECTOMY     WISDOM TOOTH EXTRACTION     Patient Active Problem List   Diagnosis Date Noted   Low back pain 11/07/2021   Class 2 obesity  with body mass index (BMI) of 38.0 to 38.9 in adult 11/07/2021   Leg swelling 08/02/2020   Dyslipidemia 01/05/2020   Type 2 diabetes mellitus without complication, without long-term current use of insulin ([place]) 08/22/2018   Scoliosis 03/25/2018   Onychomycosis 02/21/2018   Vitamin D deficiency 03/19/2015   Essential hypertension 03/05/2015    REFERRING DIAG: Z98.890 (ICD-10-CM) - H/O repair of rotator cuff  THERAPY DIAG:  Pain in left upper arm  S/P left rotator cuff repair  Rationale for Evaluation and Treatment Rehabilitation  PERTINENT HISTORY: S/p left rtc repair and subacrominal decompression on 08/18/21. H/o same surgery on right shoulder several years ago with successful recovery. She has been icing repeatedly throughout the day for pain and swelling management.   PRECAUTIONS:   SUBJECTIVE: Pt reports NPS of 1-2/10 for left shoulder.   PAIN:  1-2/10 NPS in left shoulder     OBJECTIVE: (objective measures completed at initial evaluation unless otherwise dated)  VITALS: BP 125/67 HR 80 SpO2 100     DIAGNOSTIC FINDINGS:  CLINICAL DATA:  Shoulder pain   EXAM: RIGHT SHOULDER - 3 VIEW   COMPARISON:  None.   FINDINGS: There is no evidence of fracture or dislocation.  Mild degenerative changes of the acromioclavicular joint. Bone anchors of the proximal humerus. Soft tissues are unremarkable.   IMPRESSION: No acute osseous abnormality.     Electronically Signed   By: Yetta Glassman M.D.   On: 02/15/2021 12:15     PATIENT SURVEYS:  FOTO 25/100 with target of 53   11/29/21: 56/100   COGNITION: Overall cognitive status: Within functional limits for tasks assessed                                  SENSATION: WFL   POSTURE: Normal posture    UPPER EXTREMITY ROM:      Cervical: Not performed during eval AROM  Flex    45 Ext      45  Lat Side Bend R/L 45/45 Rotation       R/L     60/60       Active ROM Right eval Left Eval/ All PROM Left  11/09/21 Left  11/22/21 A/PROM  Shoulder flexion 180 70* 160 160  Shoulder extension        Shoulder abduction 180  80* 120* 120/160  Shoulder adduction        Shoulder internal rotation 70 70    Shoulder external rotation 90 30* 60*   Elbow flexion 150 150    Elbow extension 60 60    Wrist flexion 80 80    Wrist extension 70 70    Wrist ulnar deviation 30 30    Wrist radial deviation 20 20    Wrist pronation        Wrist supination        (Blank rows = not tested)             UPPER EXTREMITY MMT:   MMT Left  10/05/21  Right eval  Shoulder flexion 3-     Shoulder extension      Shoulder abduction  3+    Shoulder adduction      Shoulder internal rotation  3    Shoulder external rotation 3+     Middle trapezius      Lower trapezius      Elbow flexion      Elbow extension      Wrist flexion  4+  Wrist extension   4+  Wrist ulnar deviation   4+  Wrist radial deviation   4+  Wrist pronation      Wrist supination      Grip strength (lbs)   Fair   (Blank rows = not tested)     JOINT MOBILITY TESTING:  Not performed    PALPATION:  Not performed              TODAY'S TREATMENT:  12/26/21:  Shoulder Flexion/Extension wall walks on LUE 1 x 10  Shoulder Abduction/Adduction wall walks on LUE 1 x 10 -min VC to perform stretch at terminal flexion ROM   Shoulder AAROM with wand and #5 AW 1 x 10 -Pt reports shooting pain on anterior and lateral surface of left shoulder  Side Lying Shoulder Flexion LUE  1 x 10 Side Lying Shoulder Flexion LUE #1 DB 1 x 10    12/22/21:  THEREX:   UBE L4 resistance 2.5 min forward, 2.5 min backward for warm up   Seated:  L shoulder flexion and abduction: 2x20/plane  3 # DB behind back passes for shoulder IR and extension: 1x20 CW and CCW  L shoulder rhythmic stabilizations in flexion and abduction: 3x30 sec at distal elbow and at wrist level for RTC stabilization  L shoulder at 90 deg abduction on mat table: GTB, L Shoulder  ER/IR, 3x8   RTB scap retractions for light periscapular activation: 2x12   PATIENT EDUCATION: Education details: form and technique for appropriate exercise and explanation of plan of care.  Person educated: Patient Education method: Customer service manager Education comprehension: verbalized understanding and returned demonstration     HOME EXERCISE PROGRAM: Access Code: [email or web address] URL: [email or web address] Date: 12/26/2021 Prepared by: Bradly Chris  Exercises - Seated Gentle Upper Trapezius Stretch  - 1 x daily - 3 reps - 30 hold - Seated Rhomboid Stretch  - 1 x daily - 3 reps - 60 sec hold - Seated Shoulder Scaption AAROM with Pulley at Side  - 1 x daily - 7 x weekly - 3 sets - 10 reps - Standing Shoulder Abduction Finger Walk at Wall  - 1 x daily - 10 reps - Prone Shoulder Horizontal Abduction  - 3 x weekly - 3 sets - 10 reps - Prone Shoulder Extension with Dumbbells  - 3 x weekly - 3 sets - 10 reps - Prone Shoulder Row  - 3 x weekly - 3 sets - 10 reps - Prone Single Arm Shoulder Y  - 3 x weekly - 3 sets - 10 reps - Single Arm Serratus Punches in Supine with Dumbbell  - 3 x weekly - 3 sets - 10 reps - Sidelying Shoulder Flexion 15 Degrees  - 3 x weekly - 3 sets - 10 reps - Standing Shoulder Flexion Wall Walk  - 1 x daily - 10 reps - Supine Shoulder External Rotation Stretch  - 1 x daily - 3 reps - 30-60 sec  hold Around world   ASSESSMENT:   CLINICAL IMPRESSION: Lighter session performed this date due to flare up of cervical/upper thoracic pain from second cyst removal. Performing all therex in seated due to also aggravated chronic knee pain. Lighter resistance performed and slight regression in exercise but maintained consistent GTB RTC strengthening without aggravation of symptoms. Encouraged pt on gentle/pain free mobility and HEP performance along with rest to recover. PT will continue progressive RTC strengthening and overhead strengthening to address  remaining deficits to complete all goals prior to discharge.   OBJECTIVE IMPAIRMENTS decreased ROM, decreased strength, impaired UE functional use, and pain.    ACTIVITY LIMITATIONS lifting, dressing, reach over head, and hygiene/grooming   PARTICIPATION LIMITATIONS: cleaning, laundry, driving, and shopping   PERSONAL FACTORS  Diabetes mellitus  are also affecting patient's functional outcome.    REHAB POTENTIAL: Good   CLINICAL DECISION MAKING: Stable/uncomplicated   EVALUATION COMPLEXITY: Low     GOALS: Goals reviewed with patient? No   SHORT TERM GOALS: Target date: 09/14/2021     Pt will be independent with HEP in order to improve strength and balance in order to decrease fall risk and improve function at home and work. Baseline:  Doing exercises independently  Goal status: Achieved    2.  Patient will be able to remove left arm from sling for increased mobility and as a sign of healing.  Baseline:  Currently not wearing sling  Goal status: Achieved    3.  Patient will achieve full PROM of left shoulder as a sign of successful healing and to progress to week 4-10 of protocol.  Baseline: Shoulder Flex R/L 180/ PROM70, Shoulder Abd R/L 180/ PROM 80, Shoulder ER R/L  90/ PROM 30 11/07/21: Shoulder Flex R/L AROM 160/160, Shoulder Abd AROM R/L 160/90 , Shoulder Abd L PROM 160  11/24/21:  Left Shoulder Abduction 160 Goal status: Partially Achieved        LONG TERM GOALS: Target date: 11/09/2021  (Remove Blue Hyperlink)   Patient will have improved function and activity level as evidenced by an increase in FOTO score by 10 points or more.  Baseline: 23/100 with target of 100  11/09/21: 59/100  11/29/21: 56/100 Goal status: Achieved    2.  Patient will exhibit symmetrical AROM between right and left shoulder to perform reaching, lifting and overhead tasks to carry out ADLs. Baseline: Shoulder Flex R/L 180/ PROM70, Shoulder Abd R/L 180/ PROM 80, Shoulder ER R/L 90/ PROM 30  10/10/21:  Shoulder Flex R/L 120/30 (120 on LUE with PROM), Shoulder Abduction AROM R/L 120/80,  Shoulder ER AROM R/L 90/40 11/07/21: Shoulder Flex R/L AROM 160/160, Shoulder Abd AROM R/L 160/120 , Shoulder Abd L PROM 160 11/24/21: Shoulder Abd AROM R/L 160/160 (Need for consistency)  Goal status: Partially Achieved    3.  Patient will exhibit symmetrical shoulder strength between right and left shoulder perform reaching, lifting and overhead tasks to carry out ADLs. Baseline: Unable to perform many tasks 11/24/21: Unable to reach bra strap through internal rotation and abduct in non scaption plane  Goal status: Partially achieved      PLAN: PT FREQUENCY: 2x/week   PT DURATION: 10 weeks   PLANNED INTERVENTIONS: Therapeutic exercises, Neuromuscular re-education, Patient/Family education, Self Care, Joint mobilization, Joint manipulation, DME instructions, Dry Needling, Electrical stimulation, Cryotherapy, Moist heat, scar mobilization, Manual therapy, and Re-evaluation   PLAN FOR NEXT SESSION: Progress to shoulder flexion and abduction with increased resistance and ROM. RTC strengthening in abducted/flexed ranges of motion to improve strength overhead. Shoulder IR/extension AROM  Bradly Chris PT, DPT  Physical Therapist- [facility]  12/26/21, 5:24 PM

## 2021-12-27 ENCOUNTER — Encounter: Payer: Self-pay | Admitting: Dermatology

## 2021-12-27 ENCOUNTER — Ambulatory Visit: Payer: Managed Care, Other (non HMO) | Admitting: Dermatology

## 2021-12-27 DIAGNOSIS — L578 Other skin changes due to chronic exposure to nonionizing radiation: Secondary | ICD-10-CM

## 2021-12-27 DIAGNOSIS — D485 Neoplasm of uncertain behavior of skin: Secondary | ICD-10-CM

## 2021-12-27 DIAGNOSIS — L820 Inflamed seborrheic keratosis: Secondary | ICD-10-CM

## 2021-12-27 DIAGNOSIS — D225 Melanocytic nevi of trunk: Secondary | ICD-10-CM | POA: Diagnosis not present

## 2021-12-27 DIAGNOSIS — L821 Other seborrheic keratosis: Secondary | ICD-10-CM | POA: Diagnosis not present

## 2021-12-27 DIAGNOSIS — L918 Other hypertrophic disorders of the skin: Secondary | ICD-10-CM | POA: Diagnosis not present

## 2021-12-27 DIAGNOSIS — D229 Melanocytic nevi, unspecified: Secondary | ICD-10-CM

## 2021-12-27 DIAGNOSIS — D239 Other benign neoplasm of skin, unspecified: Secondary | ICD-10-CM

## 2021-12-27 HISTORY — DX: Other benign neoplasm of skin, unspecified: D23.9

## 2021-12-27 NOTE — Patient Instructions (Addendum)
Wound Care Instructions  Cleanse wound gently with soap and water once a day then pat dry with clean gauze. Apply a thin coat of Petrolatum (petroleum jelly, "Vaseline") over the wound (unless you have an allergy to this). We recommend that you use a new, sterile tube of Vaseline. Do not pick or remove scabs. Do not remove the yellow or white "healing tissue" from the base of the wound.  Cover the wound with fresh, clean, nonstick gauze and secure with paper tape. You may use Band-Aids in place of gauze and tape if the wound is small enough, but would recommend trimming much of the tape off as there is often too much. Sometimes Band-Aids can irritate the skin.  You should call the office for your biopsy report after 1 week if you have not already been contacted.  If you experience any problems, such as abnormal amounts of bleeding, swelling, significant bruising, significant pain, or evidence of infection, please call the office immediately.  FOR ADULT SURGERY PATIENTS: If you need something for pain relief you may take 1 extra strength Tylenol (acetaminophen) AND 2 Ibuprofen (200mg each) together every 4 hours as needed for pain. (do not take these if you are allergic to them or if you have a reason you should not take them.) Typically, you may only need pain medication for 1 to 3 days.     Due to recent changes in healthcare laws, you may see results of your pathology and/or laboratory studies on MyChart before the doctors have had a chance to review them. We understand that in some cases there may be results that are confusing or concerning to you. Please understand that not all results are received at the same time and often the doctors may need to interpret multiple results in order to provide you with the best plan of care or course of treatment. Therefore, we ask that you please give us 2 business days to thoroughly review all your results before contacting the office for clarification. Should  we see a critical lab result, you will be contacted sooner.   If You Need Anything After Your Visit  If you have any questions or concerns for your doctor, please call our main line at [phone] and press option 4 to reach your doctor's medical assistant. If no one answers, please leave a voicemail as directed and we will return your call as soon as possible. Messages left after 4 pm will be answered the following business day.   You may also send us a message via MyChart. We typically respond to MyChart messages within 1-2 business days.  For prescription refills, please ask your pharmacy to contact our office. Our fax number is [phone].  If you have an urgent issue when the clinic is closed that cannot wait until the next business day, you can page your doctor at the number below.    Please note that while we do our best to be available for urgent issues outside of office hours, we are not available 24/7.   If you have an urgent issue and are unable to reach us, you may choose to seek medical care at your doctor's office, retail clinic, urgent care center, or emergency room.  If you have a medical emergency, please immediately call 911 or go to the emergency department.  Pager Numbers  - Dr. Kowalski: [phone]  - Dr. Moye: [phone]  - Dr. Stewart: [phone]  In the event of inclement weather, please call our main line at   [phone] for an update on the status of any delays or closures.  Dermatology Medication Tips: Please keep the boxes that topical medications come in in order to help keep track of the instructions about where and how to use these. Pharmacies typically print the medication instructions only on the boxes and not directly on the medication tubes.   If your medication is too expensive, please contact our office at [phone] option 4 or send us a message through MyChart.   We are unable to tell what your co-pay for medications will be in  advance as this is different depending on your insurance coverage. However, we may be able to find a substitute medication at lower cost or fill out paperwork to get insurance to cover a needed medication.   If a prior authorization is required to get your medication covered by your insurance company, please allow us 1-2 business days to complete this process.  Drug prices often vary depending on where the prescription is filled and some pharmacies may offer cheaper prices.  The website www.goodrx.com contains coupons for medications through different pharmacies. The prices here do not account for what the cost may be with help from insurance (it may be cheaper with your insurance), but the website can give you the price if you did not use any insurance.  - You can print the associated coupon and take it with your prescription to the pharmacy.  - You may also stop by our office during regular business hours and pick up a GoodRx coupon card.  - If you need your prescription sent electronically to a different pharmacy, notify our office through [place] MyChart or by phone at [phone] option 4.     Si Usted Necesita Algo Despus de Su Visita  Tambin puede enviarnos un mensaje a travs de MyChart. Por lo general respondemos a los mensajes de MyChart en el transcurso de 1 a 2 das hbiles.  Para renovar recetas, por favor pida a su farmacia que se ponga en contacto con nuestra oficina. Nuestro nmero de fax es el [phone].  Si tiene un asunto urgente cuando la clnica est cerrada y que no puede esperar hasta el siguiente da hbil, puede llamar/localizar a su doctor(a) al nmero que aparece a continuacin.   Por favor, tenga en cuenta que aunque hacemos todo lo posible para estar disponibles para asuntos urgentes fuera del horario de oficina, no estamos disponibles las 24 horas del da, los 7 das de la semana.   Si tiene un problema urgente y no puede comunicarse con nosotros, puede  optar por buscar atencin mdica  en el consultorio de su doctor(a), en una clnica privada, en un centro de atencin urgente o en una sala de emergencias.  Si tiene una emergencia mdica, por favor llame inmediatamente al 911 o vaya a la sala de emergencias.  Nmeros de bper  - Dr. Kowalski: [phone]  - Dra. Moye: [phone]  - Dra. Stewart: [phone]  En caso de inclemencias del tiempo, por favor llame a nuestra lnea principal al [phone] para una actualizacin sobre el estado de cualquier retraso o cierre.  Consejos para la medicacin en dermatologa: Por favor, guarde las cajas en las que vienen los medicamentos de uso tpico para ayudarle a seguir las instrucciones sobre dnde y cmo usarlos. Las farmacias generalmente imprimen las instrucciones del medicamento slo en las cajas y no directamente en los tubos del medicamento.   Si su medicamento es muy caro, por favor, pngase en contacto con   nuestra oficina llamando al [phone] y presione la opcin 4 o envenos un mensaje a travs de MyChart.   No podemos decirle cul ser su copago por los medicamentos por adelantado ya que esto es diferente dependiendo de la cobertura de su seguro. Sin embargo, es posible que podamos encontrar un medicamento sustituto a menor costo o llenar un formulario para que el seguro cubra el medicamento que se considera necesario.   Si se requiere una autorizacin previa para que su compaa de seguros cubra su medicamento, por favor permtanos de 1 a 2 das hbiles para completar este proceso.  Los precios de los medicamentos varan con frecuencia dependiendo del lugar de dnde se surte la receta y alguna farmacias pueden ofrecer precios ms baratos.  El sitio web www.goodrx.com tiene cupones para medicamentos de diferentes farmacias. Los precios aqu no tienen en cuenta lo que podra costar con la ayuda del seguro (puede ser ms barato con su seguro), pero el sitio web puede darle el  precio si no utiliz ningn seguro.  - Puede imprimir el cupn correspondiente y llevarlo con su receta a la farmacia.  - Tambin puede pasar por nuestra oficina durante el horario de atencin regular y recoger una tarjeta de cupones de GoodRx.  - Si necesita que su receta se enve electrnicamente a una farmacia diferente, informe a nuestra oficina a travs de MyChart de [place] o por telfono llamando al [phone] y presione la opcin 4.  

## 2021-12-27 NOTE — Progress Notes (Signed)
Follow-Up Visit   Subjective  Tina Stephenson is a 66 y.o. female who presents for the following: Suture removal (neoplasm that was excised last week on the L back above bra line - pathology results are not yet available, patient is here today for suture removal), Irregular nevi (On the back x 3 - patient is here today for biopsies ), and Irritated skin lesions (On the scalp and forehead - previously treated with LN2 but has not resolved, patient would like treated today). The patient has spots, moles and lesions to be evaluated, some may be new or changing and the patient has concerns.  The following portions of the chart were reviewed this encounter and updated as appropriate:   Tobacco  Allergies  Meds  Problems  Med Hx  Surg Hx  Fam Hx     Review of Systems:  No other skin or systemic complaints except as noted in HPI or Assessment and Plan.  Objective  Well appearing patient in no apparent distress; mood and affect are within normal limits.  A focused examination was performed including the face, trunk, . Relevant physical exam findings are noted in the Assessment and Plan.  L middle medial scapula 1.1 cm irregular brown macule.   Scalp x 1, forehead x 1 (2) Erythematous stuck-on, waxy papule or plaque   Assessment & Plan  Neoplasm of uncertain behavior of skin (3) L middle medial scapula  Epidermal / dermal shaving  Lesion diameter (cm):  1.1 Informed consent: discussed and consent obtained   Timeout: patient name, date of birth, surgical site, and procedure verified   Procedure prep:  Patient was prepped and draped in usual sterile fashion Prep type:  Isopropyl alcohol Anesthesia: the lesion was anesthetized in a standard fashion   Anesthetic:  1% lidocaine w/ epinephrine 1-100,000 buffered w/ 8.4% NaHCO3 Instrument used: flexible razor blade   Hemostasis achieved with: pressure, aluminum chloride and electrodesiccation   Outcome: patient tolerated procedure well    Post-procedure details: sterile dressing applied and wound care instructions given   Dressing type: bandage and petrolatum    R upper back paraspinal  Epidermal / dermal shaving  Lesion diameter (cm):  1.1 Informed consent: discussed and consent obtained   Timeout: patient name, date of birth, surgical site, and procedure verified   Procedure prep:  Patient was prepped and draped in usual sterile fashion Prep type:  Isopropyl alcohol Anesthesia: the lesion was anesthetized in a standard fashion   Anesthetic:  1% lidocaine w/ epinephrine 1-100,000 buffered w/ 8.4% NaHCO3 Instrument used: flexible razor blade   Hemostasis achieved with: pressure, aluminum chloride and electrodesiccation   Outcome: patient tolerated procedure well   Post-procedure details: sterile dressing applied and wound care instructions given   Dressing type: bandage and petrolatum    R middle medial scapula  Epidermal / dermal shaving  Lesion diameter (cm):  1.1 Informed consent: discussed and consent obtained   Timeout: patient name, date of birth, surgical site, and procedure verified   Procedure prep:  Patient was prepped and draped in usual sterile fashion Prep type:  Isopropyl alcohol Anesthesia: the lesion was anesthetized in a standard fashion   Anesthetic:  1% lidocaine w/ epinephrine 1-100,000 buffered w/ 8.4% NaHCO3 Instrument used: flexible razor blade   Hemostasis achieved with: pressure, aluminum chloride and electrodesiccation   Outcome: patient tolerated procedure well   Post-procedure details: sterile dressing applied and wound care instructions given   Dressing type: bandage and petrolatum    Related Procedures  Anatomic Pathology Report  Inflamed seborrheic keratosis (2) Scalp x 1, forehead x 1  Destruction of lesion - Scalp x 1, forehead x 1 Complexity: simple   Destruction method: cryotherapy   Informed consent: discussed and consent obtained   Timeout:  patient name, date of  birth, surgical site, and procedure verified Lesion destroyed using liquid nitrogen: Yes   Region frozen until ice ball extended beyond lesion: Yes   Outcome: patient tolerated procedure well with no complications   Post-procedure details: wound care instructions given    Actinic skin damage  Melanocytic nevus, unspecified location  Seborrheic keratosis  Skin tags, multiple acquired  Acrochordons (Skin Tags) - R neck - Fleshy, skin-colored pedunculated papules - Benign appearing.  - Observe. - If desired, they can be removed with an in office procedure that is not covered by insurance. - Please call the clinic if you notice any new or changing lesions.   Seborrheic Keratoses - Stuck-on, waxy, tan-brown papules and/or plaques  - Benign-appearing - Discussed benign etiology and prognosis. - Observe - Call for any changes  Actinic Damage - chronic, secondary to cumulative UV radiation exposure/sun exposure over time - diffuse scaly erythematous macules with underlying dyspigmentation - Recommend daily broad spectrum sunscreen SPF 30+ to sun-exposed areas, reapply every 2 hours as needed.  - Recommend staying in the shade or wearing long sleeves, sun glasses (UVA+UVB protection) and wide brim hats (4-inch brim around the entire circumference of the hat). - Call for new or changing lesions.  Melanocytic Nevi - Tan-brown and/or pink-flesh-colored symmetric macules and papules - Benign appearing on exam today - Observation - Call clinic for new or changing moles - Recommend daily use of broad spectrum spf 30+ sunscreen to sun-exposed areas.   Return for appointment as needed for skin tag removal .  I, Rudell Cobb, CMA, am acting as scribe for Sarina Ser, MD . Documentation: I have reviewed the above documentation for accuracy and completeness, and I agree with the above.  Sarina Ser, MD

## 2021-12-28 LAB — ANATOMIC PATHOLOGY REPORT

## 2021-12-29 ENCOUNTER — Ambulatory Visit: Payer: Managed Care, Other (non HMO) | Admitting: Physical Therapy

## 2021-12-29 ENCOUNTER — Encounter: Payer: Self-pay | Admitting: Physical Therapy

## 2021-12-29 DIAGNOSIS — M79622 Pain in left upper arm: Secondary | ICD-10-CM

## 2021-12-29 DIAGNOSIS — Z9889 Other specified postprocedural states: Secondary | ICD-10-CM

## 2021-12-29 NOTE — Therapy (Signed)
OUTPATIENT PHYSICAL THERAPY TREATMENT NOTE     Patient Name: Tina Stephenson MRN: [id number] DOB:11-26-1955, 66 y.o., female Today's Date: 12/29/2021  PCP: Delsa Grana PA-C  REFERRING PROVIDER: Dwana Melena PA-C  END OF SESSION:   PT End of Session - 12/29/21 1723     Visit Number 29    Number of Visits 40    Date for PT Re-Evaluation 01/10/22    Authorization Type [employer] 2023    Authorization Time Period 11/10/21-01/10/22    Authorization - Visit Number 10    Authorization - Number of Visits 40    Progress Note Due on Visit 30    PT Start Time 7253    PT Stop Time 1800    PT Time Calculation (min) 45 min    Activity Tolerance Patient tolerated treatment well    Behavior During Therapy [facility] for tasks assessed/performed                 Past Medical History:  Diagnosis Date   Bronchitis    Bruised toe 12/20/2020   Cervical polyp 08/22/2018   Depression    Diabetes mellitus without complication ([place])    Gallbladder polyp    GERD (gastroesophageal reflux disease)    History of cyst of breast 03/25/2018   Had removal of 3 cysts of breasts in approx 2008-2012.   Hypertension    Hypertensive disorder 03/05/2015   Liver tumor (benign)    Nocturia 08/22/2018   Nontraumatic complete tear of left rotator cuff 08/15/2021   Nontraumatic complete tear of right rotator cuff 07/29/2021   Sacrum and coccyx fracture ([place])    Sacrum and coccyx fracture, sequela 02/21/2018   Occurred in [place] at age 20, still have residual in bilateral low back.   Sciatica    Tendinopathy of right biceps tendon 09/24/2020   Traumatic tear of supraspinatus tendon of left shoulder 09/24/2020   Traumatic tear of supraspinatus tendon of right shoulder 09/24/2020   Past Surgical History:  Procedure Laterality Date   APPENDECTOMY     NASAL SEPTUM SURGERY     VASECTOMY     WISDOM TOOTH EXTRACTION     Patient Active Problem List   Diagnosis Date Noted   Low back pain 11/07/2021   Class 2 obesity  with body mass index (BMI) of 38.0 to 38.9 in adult 11/07/2021   Leg swelling 08/02/2020   Dyslipidemia 01/05/2020   Type 2 diabetes mellitus without complication, without long-term current use of insulin ([place]) 08/22/2018   Scoliosis 03/25/2018   Onychomycosis 02/21/2018   Vitamin D deficiency 03/19/2015   Essential hypertension 03/05/2015    REFERRING DIAG: Z98.890 (ICD-10-CM) - H/O repair of rotator cuff  THERAPY DIAG:  Pain in left upper arm  S/P left rotator cuff repair  Rationale for Evaluation and Treatment Rehabilitation  PERTINENT HISTORY: S/p left rtc repair and subacrominal decompression on 08/18/21. H/o same surgery on right shoulder several years ago with successful recovery. She has been icing repeatedly throughout the day for pain and swelling management.   PRECAUTIONS:   SUBJECTIVE: Reports increased back pain after having procedure where cyst on back. Her shoulder exercises have been otherwise good.    PAIN:  1-2/10 NPS in left shoulder     OBJECTIVE: (objective measures completed at initial evaluation unless otherwise dated)  VITALS: BP 125/67 HR 80 SpO2 100     DIAGNOSTIC FINDINGS:  CLINICAL DATA:  Shoulder pain   EXAM: RIGHT SHOULDER - 3 VIEW   COMPARISON:  None.  FINDINGS: There is no evidence of fracture or dislocation. Mild degenerative changes of the acromioclavicular joint. Bone anchors of the proximal humerus. Soft tissues are unremarkable.   IMPRESSION: No acute osseous abnormality.     Electronically Signed   By: Yetta Glassman M.D.   On: 02/15/2021 12:15     PATIENT SURVEYS:  FOTO 25/100 with target of 53   11/29/21: 56/100   COGNITION: Overall cognitive status: Within functional limits for tasks assessed                                  SENSATION: WFL   POSTURE: Normal posture    UPPER EXTREMITY ROM:      Cervical: Not performed during eval AROM  Flex    45 Ext      45  Lat Side Bend R/L 45/45 Rotation        R/L     60/60       Active ROM Right eval Left Eval/ All PROM Left 11/09/21 Left  11/22/21 A/PROM  Shoulder flexion 180 70* 160 160  Shoulder extension        Shoulder abduction 180  80* 120* 120/160  Shoulder adduction        Shoulder internal rotation 70 70    Shoulder external rotation 90 30* 60*   Elbow flexion 150 150    Elbow extension 60 60    Wrist flexion 80 80    Wrist extension 70 70    Wrist ulnar deviation 30 30    Wrist radial deviation 20 20    Wrist pronation        Wrist supination        (Blank rows = not tested)             UPPER EXTREMITY MMT:   MMT Left  10/05/21  Right eval  Shoulder flexion 3-     Shoulder extension      Shoulder abduction  3+    Shoulder adduction      Shoulder internal rotation  3    Shoulder external rotation 3+     Middle trapezius      Lower trapezius      Elbow flexion      Elbow extension      Wrist flexion  4+  Wrist extension   4+  Wrist ulnar deviation   4+  Wrist radial deviation   4+  Wrist pronation      Wrist supination      Grip strength (lbs)   Fair   (Blank rows = not tested)     JOINT MOBILITY TESTING:  Not performed    PALPATION:  Not performed              TODAY'S TREATMENT:  12/29/21:  UBE level 7 for seat with resistance level 2 for 5 min  Side Lying Left Shoulder ER #3 1 x 10 Side Lying Left Shoulder ER # 2 2 x 10 Shoulder ER with LUE #2 DB 3 x 10  Wall ladder climbs with bound wrists with yellow band 2 x 10 Serratus Slides with pillow case and protracted scapulae 2 x 5   12/26/21:  Shoulder Flexion/Extension wall walks on LUE 1 x 10  Shoulder Abduction/Adduction wall walks on LUE 1 x 10 -min VC to perform stretch at terminal flexion ROM   Shoulder AAROM with wand and #5 AW 1 x 10 -  Pt reports shooting pain on anterior and lateral surface of left shoulder  Side Lying Shoulder Flexion LUE  1 x 10 Side Lying Shoulder Flexion LUE #1 DB 2 x 10  Supine left shoulder abduction AROM 2  x 10  -Pt needs to terminate due to increased pain in anterior surface of shoulder joint   12/22/21:  THEREX:   UBE L4 resistance 2.5 min forward, 2.5 min backward for warm up   Seated:  L shoulder flexion and abduction: 2x20/plane  3 # DB behind back passes for shoulder IR and extension: 1x20 CW and CCW  L shoulder rhythmic stabilizations in flexion and abduction: 3x30 sec at distal elbow and at wrist level for RTC stabilization  L shoulder at 90 deg abduction on mat table: GTB, L Shoulder ER/IR, 3x8   RTB scap retractions for light periscapular activation: 2x12   PATIENT EDUCATION: Education details: form and technique for appropriate exercise and explanation of plan of care.  Person educated: Patient Education method: Customer service manager Education comprehension: verbalized understanding and returned demonstration     HOME EXERCISE PROGRAM: Access Code: [email or web address] URL: [email or web address] Date: 12/29/2021 Prepared by: Bradly Chris  Exercises - Seated Gentle Upper Trapezius Stretch  - 1 x daily - 3 reps - 30 hold - Seated Rhomboid Stretch  - 1 x daily - 3 reps - 60 sec hold - Standing Shoulder Abduction Finger Walk at Wall  - 1 x daily - 10 reps - Prone Shoulder Horizontal Abduction  - 3 x weekly - 3 sets - 10 reps - Prone Shoulder Extension with Dumbbells  - 3 x weekly - 3 sets - 10 reps - Prone Shoulder Row  - 3 x weekly - 3 sets - 10 reps - Prone Single Arm Shoulder Y  - 3 x weekly - 3 sets - 10 reps - Sidelying Shoulder Flexion 15 Degrees  - 3 x weekly - 3 sets - 10 reps - Standing Shoulder Flexion Wall Walk  - 1 x daily - 10 reps - Supine Shoulder External Rotation Stretch  - 1 x daily - 3 reps - 30-60 sec  hold - Serratus Activation at Wall  - 1 x daily - 7 x weekly - 3 sets - 10 reps - Sidelying Shoulder ER with Towel and Dumbbell  - 1 x daily - 7 x weekly - 3 sets - 10 reps - Seated Shoulder External Rotation in Abduction Supported with  Dumbbell  - 3 x weekly - 3 sets - 10 reps  ASSESSMENT:   CLINICAL IMPRESSION:  Continued POC with focus on RTC and parascapular strengthening. Pt able to perform without an increase her left shoulder pain. Next session is progress note and reassessment will be done. She is nearing end of POC and close to achieving all of her goals. She will continue to benefit from skilled PT to progress left shoulder AROM and strength to return to performing overhead activities for recreational gardening and self care tasks.    OBJECTIVE IMPAIRMENTS decreased ROM, decreased strength, impaired UE functional use, and pain.    ACTIVITY LIMITATIONS lifting, dressing, reach over head, and hygiene/grooming   PARTICIPATION LIMITATIONS: cleaning, laundry, driving, and shopping   PERSONAL FACTORS  Diabetes mellitus  are also affecting patient's functional outcome.    REHAB POTENTIAL: Good   CLINICAL DECISION MAKING: Stable/uncomplicated   EVALUATION COMPLEXITY: Low     GOALS: Goals reviewed with patient? No   SHORT TERM GOALS: Target date: 09/14/2021     Pt  will be independent with HEP in order to improve strength and balance in order to decrease fall risk and improve function at home and work. Baseline:  Doing exercises independently  Goal status: Achieved    2.  Patient will be able to remove left arm from sling for increased mobility and as a sign of healing.  Baseline:  Currently not wearing sling  Goal status: Achieved    3.  Patient will achieve full PROM of left shoulder as a sign of successful healing and to progress to week 4-10 of protocol.  Baseline: Shoulder Flex R/L 180/ PROM70, Shoulder Abd R/L 180/ PROM 80, Shoulder ER R/L 90/ PROM 30 11/07/21: Shoulder Flex R/L AROM 160/160, Shoulder Abd AROM R/L 160/90 , Shoulder Abd L PROM 160  11/24/21:  Left Shoulder Abduction 160 Goal status: Partially Achieved        LONG TERM GOALS: Target date: 11/09/2021  (Remove Blue Hyperlink)   Patient  will have improved function and activity level as evidenced by an increase in FOTO score by 10 points or more.  Baseline: 23/100 with target of 100  11/09/21: 59/100  11/29/21: 56/100 Goal status: Achieved    2.  Patient will exhibit symmetrical AROM between right and left shoulder to perform reaching, lifting and overhead tasks to carry out ADLs. Baseline: Shoulder Flex R/L 180/ PROM70, Shoulder Abd R/L 180/ PROM 80, Shoulder ER R/L 90/ PROM 30  10/10/21: Shoulder Flex R/L 120/30 (120 on LUE with PROM), Shoulder Abduction AROM R/L 120/80,  Shoulder ER AROM R/L 90/40 11/07/21: Shoulder Flex R/L AROM 160/160, Shoulder Abd AROM R/L 160/120 , Shoulder Abd L PROM 160 11/24/21: Shoulder Abd AROM R/L 160/160 (Need for consistency)  Goal status: Partially Achieved    3.  Patient will exhibit symmetrical shoulder strength between right and left shoulder perform reaching, lifting and overhead tasks to carry out ADLs. Baseline: Unable to perform many tasks 11/24/21: Unable to reach bra strap through internal rotation and abduct in non scaption plane  Goal status: Partially achieved      PLAN: PT FREQUENCY: 2x/week   PT DURATION: 10 weeks   PLANNED INTERVENTIONS: Therapeutic exercises, Neuromuscular re-education, Patient/Family education, Self Care, Joint mobilization, Joint manipulation, DME instructions, Dry Needling, Electrical stimulation, Cryotherapy, Moist heat, scar mobilization, Manual therapy, and Re-evaluation   PLAN FOR NEXT SESSION: Reassess goals. Progress RTC and parascapular strengthening exercises   Bradly Chris PT, DPT  Physical Therapist- [facility]  12/29/21, 7:59 PM

## 2022-01-03 ENCOUNTER — Ambulatory Visit: Payer: Managed Care, Other (non HMO) | Admitting: Physical Therapy

## 2022-01-03 ENCOUNTER — Encounter: Payer: Self-pay | Admitting: Physical Therapy

## 2022-01-03 DIAGNOSIS — M79622 Pain in left upper arm: Secondary | ICD-10-CM | POA: Diagnosis not present

## 2022-01-03 DIAGNOSIS — Z9889 Other specified postprocedural states: Secondary | ICD-10-CM

## 2022-01-03 NOTE — Therapy (Addendum)
OUTPATIENT PHYSICAL THERAPY TREATMENT NOTE / Re-certification   Episodes of Care: 11/10/21-01/10/22   Patient Name: Tina Stephenson MRN: [id number] DOB:December 26, 1955, 66 y.o., female Today's Date: 01/05/2022  PCP: Delsa Grana PA-C  REFERRING PROVIDER: Dwana Melena PA-C  END OF SESSION:   PT End of Session - 01/05/22 1500     Visit Number 31    Number of Visits 40    Date for PT Re-Evaluation 01/10/22    Authorization Type [employer] [id number]    Authorization Time Period 11/10/21-01/10/22    Authorization - Visit Number 30    Authorization - Number of Visits 40    Progress Note Due on Visit 30    PT Start Time 1500    PT Stop Time 1545    PT Time Calculation (min) 45 min    Activity Tolerance Patient tolerated treatment well    Behavior During Therapy [facility] for tasks assessed/performed                 Past Medical History:  Diagnosis Date   Bronchitis    Bruised toe 12/20/2020   Cervical polyp 08/22/2018   Depression    Diabetes mellitus without complication ([place])    Dysplastic nevus 12/27/2021   Left medial mid scapula. Mild atypia   Gallbladder polyp    GERD (gastroesophageal reflux disease)    History of cyst of breast 03/25/2018   Had removal of 3 cysts of breasts in approx 2008-2012.   Hypertension    Hypertensive disorder 03/05/2015   Liver tumor (benign)    Nocturia 08/22/2018   Nontraumatic complete tear of left rotator cuff 08/15/2021   Nontraumatic complete tear of right rotator cuff 07/29/2021   Sacrum and coccyx fracture ([place])    Sacrum and coccyx fracture, sequela 02/21/2018   Occurred in [place] at age 32, still have residual in bilateral low back.   Sciatica    Tendinopathy of right biceps tendon 09/24/2020   Traumatic tear of supraspinatus tendon of left shoulder 09/24/2020   Traumatic tear of supraspinatus tendon of right shoulder 09/24/2020   Past Surgical History:  Procedure Laterality Date   APPENDECTOMY     NASAL SEPTUM SURGERY      VASECTOMY     WISDOM TOOTH EXTRACTION     Patient Active Problem List   Diagnosis Date Noted   Low back pain 11/07/2021   Class 2 obesity with body mass index (BMI) of 38.0 to 38.9 in adult 11/07/2021   Leg swelling 08/02/2020   Dyslipidemia 01/05/2020   Type 2 diabetes mellitus without complication, without long-term current use of insulin ([place]) 08/22/2018   Scoliosis 03/25/2018   Onychomycosis 02/21/2018   Vitamin D deficiency 03/19/2015   Essential hypertension 03/05/2015    REFERRING DIAG: Z98.890 (ICD-10-CM) - H/O repair of rotator cuff  THERAPY DIAG:  Pain in left upper arm  S/P left rotator cuff repair  Muscle weakness (generalized)  Rationale for Evaluation and Treatment Rehabilitation  PERTINENT HISTORY: S/p left rtc repair and subacrominal decompression on 08/18/21. H/o same surgery on right shoulder several years ago with successful recovery. She has been icing repeatedly throughout the day for pain and swelling management.   PRECAUTIONS:   SUBJECTIVE: Pt reports that she continues to feel an improvement in her right shoulder especially after performing sleeper and biceps stretch.   PAIN:  1-2/10 NPS in left shoulder     OBJECTIVE: (objective measures completed at initial evaluation unless otherwise dated)  VITALS: BP 125/67 HR 80 SpO2 100  DIAGNOSTIC FINDINGS:  CLINICAL DATA:  Shoulder pain   EXAM: RIGHT SHOULDER - 3 VIEW   COMPARISON:  None.   FINDINGS: There is no evidence of fracture or dislocation. Mild degenerative changes of the acromioclavicular joint. Bone anchors of the proximal humerus. Soft tissues are unremarkable.   IMPRESSION: No acute osseous abnormality.     Electronically Signed   By: Yetta Glassman M.D.   On: 02/15/2021 12:15     PATIENT SURVEYS:  FOTO 25/100 with target of 53   11/29/21: 56/100   COGNITION: Overall cognitive status: Within functional limits for tasks assessed                                   SENSATION: WFL   POSTURE: Normal posture    UPPER EXTREMITY ROM:      Cervical: Not performed during eval AROM  Flex    45 Ext      45  Lat Side Bend R/L 45/45 Rotation       R/L     60/60       Active ROM Right eval Left Eval/ All PROM Left 11/09/21 Left  11/22/21 A/PROM  Shoulder flexion 180 70* 160 160  Shoulder extension        Shoulder abduction 180  80* 120* 120/160  Shoulder adduction        Shoulder internal rotation 70 70    Shoulder external rotation 90 30* 60*   Elbow flexion 150 150    Elbow extension 60 60    Wrist flexion 80 80    Wrist extension 70 70    Wrist ulnar deviation 30 30    Wrist radial deviation 20 20    Wrist pronation        Wrist supination        (Blank rows = not tested)             UPPER EXTREMITY MMT:   MMT Left  10/05/21  Right eval  Shoulder flexion 3-     Shoulder extension      Shoulder abduction  3+    Shoulder adduction      Shoulder internal rotation  3    Shoulder external rotation 3+     Middle trapezius      Lower trapezius      Elbow flexion      Elbow extension      Wrist flexion  4+  Wrist extension   4+  Wrist ulnar deviation   4+  Wrist radial deviation   4+  Wrist pronation      Wrist supination      Grip strength (lbs)   Fair   (Blank rows = not tested)     JOINT MOBILITY TESTING:  Not performed    PALPATION:  Not performed              TODAY'S TREATMENT:  01/05/22:   THEREX  UBE level 7 for seat with resistance level 2 for 5 min  Left Bicep Curls 3 x 30 sec  Pec Stretch 60 deg 3 x 30 sec  Pec Stretch 90 deg 3 x 30 sec  Lat Stretch on LUE 3 x 30 sec    MANUAL THERAPY  Soft tissue trigger point release of left biceps  Biceps stretch on left 2 x 30 sec      01/03/22: UBE level 7 for seat with resistance  level 2 for 5 min  Shoulder Flex AROM  R/L 160/160  Shoulder Abd AROM R/L 140/140   Standing Shoulder combined IR stretch 3 x 30 sec  -Pt reports increased pain in  shoulder joint along with biceps stiffness   Standing Biceps Stretch on LUE 3 x 30 sec   Supine Biceps stretch on LUE 3 x 30 sec with # 3 weight   Sleeper Stretch Side Lying 2 x 30 sec -Pt unable to feel stretch in back of shoulder   Sleeper Stretch in standing on LUE 3 x 30 sec    12/29/21:  UBE level 7 for seat with resistance level 2 for 5 min  Side Lying Left Shoulder ER #3 1 x 10 Side Lying Left Shoulder ER # 2 2 x 10 Shoulder ER with LUE #2 DB 3 x 10  Wall ladder climbs with bound wrists with yellow band 2 x 10 Serratus Slides with pillow case and protracted scapulae 2 x 5   12/26/21:  Shoulder Flexion/Extension wall walks on LUE 1 x 10  Shoulder Abduction/Adduction wall walks on LUE 1 x 10 -min VC to perform stretch at terminal flexion ROM   Shoulder AAROM with wand and #5 AW 1 x 10 -Pt reports shooting pain on anterior and lateral surface of left shoulder  Side Lying Shoulder Flexion LUE  1 x 10 Side Lying Shoulder Flexion LUE #1 DB 2 x 10  Supine left shoulder abduction AROM 2 x 10  -Pt needs to terminate due to increased pain in anterior surface of shoulder joint   12/22/21:  THEREX:   UBE L4 resistance 2.5 min forward, 2.5 min backward for warm up   Seated:  L shoulder flexion and abduction: 2x20/plane  3 # DB behind back passes for shoulder IR and extension: 1x20 CW and CCW  L shoulder rhythmic stabilizations in flexion and abduction: 3x30 sec at distal elbow and at wrist level for RTC stabilization  L shoulder at 90 deg abduction on mat table: GTB, L Shoulder ER/IR, 3x8   RTB scap retractions for light periscapular activation: 2x12   PATIENT EDUCATION: Education details: form and technique for appropriate exercise and explanation of plan of care.  Person educated: Patient Education method: Customer service manager Education comprehension: verbalized understanding and returned demonstration     HOME EXERCISE PROGRAM: Access Code: [email or web address] URL:  [email or web address] Date: 01/03/2022 Prepared by: Bradly Chris  Exercises - Seated Gentle Upper Trapezius Stretch  - 1 x daily - 3 reps - 30 hold - Standing Sleeper Stretch at [place] 1 x daily - 3 reps - 30-60 sec  hold - Bicep Stretch at Table  - 1 x daily - 3 reps - 30-60 sec  hold - Prone Shoulder Horizontal Abduction  - 3 x weekly - 3 sets - 10 reps - Prone Shoulder Extension with Dumbbells  - 3 x weekly - 3 sets - 10 reps - Prone Shoulder Row  - 3 x weekly - 3 sets - 10 reps - Prone Single Arm Shoulder Y  - 3 x weekly - 3 sets - 10 reps - Standing Shoulder Flexion Wall Walk  - 1 x daily - 10 reps - Supine Shoulder External Rotation Stretch  - 1 x daily - 3 reps - 30-60 sec  hold - Serratus Activation at Wall  - 1 x daily - 7 x weekly - 3 sets - 10 reps - Sidelying Shoulder ER with Towel and Dumbbell  - 1 x daily -  7 x weekly - 3 sets - 10 reps - Seated Shoulder External Rotation in Abduction Supported with Dumbbell  - 3 x weekly - 3 sets - 10 reps  ASSESSMENT:   CLINICAL IMPRESSION: Continue POC with focus on improving left biceps extensibility and left shoulder ER ROM. Pt able to perform all exercises without an increase in her pain. She continues to show left shoulder ROM limitations with ER likely due to decreased lat length and pec length from prolonged position of left arm in sling. She will continue to benefit from skilled PT to progress left shoulder AROM and strength to return to performing overhead activities for recreational gardening and self care tasks.   OBJECTIVE IMPAIRMENTS decreased ROM, decreased strength, impaired UE functional use, and pain.    ACTIVITY LIMITATIONS lifting, dressing, reach over head, and hygiene/grooming   PARTICIPATION LIMITATIONS: cleaning, laundry, driving, and shopping   PERSONAL FACTORS  Diabetes mellitus  are also affecting patient's functional outcome.    REHAB POTENTIAL: Good   CLINICAL DECISION MAKING:  Stable/uncomplicated   EVALUATION COMPLEXITY: Low     GOALS: Goals reviewed with patient? No   SHORT TERM GOALS: Target date: 09/14/2021     Pt will be independent with HEP in order to improve strength and balance in order to decrease fall risk and improve function at home and work. Baseline:  Doing exercises independently  Goal status: Achieved    2.  Patient will be able to remove left arm from sling for increased mobility and as a sign of healing.  Baseline:  Currently not wearing sling  Goal status: Achieved    3.  Patient will achieve full PROM of left shoulder as a sign of successful healing and to progress to week 4-10 of protocol.  Baseline: Shoulder Flex R/L 180/ PROM70, Shoulder Abd R/L 180/ PROM 80, Shoulder ER R/L 90/ PROM 30 11/07/21: Shoulder Flex R/L AROM 160/160, Shoulder Abd AROM R/L 160/90 , Shoulder Abd L PROM 160  11/24/21:  Left Shoulder Abduction 160  01/03/22: Shoulder Abd and Flexion are symmetrical  Goal status: Achieved        LONG TERM GOALS: Target date: 11/09/2021  (Remove Blue Hyperlink)   Patient will have improved function and activity level as evidenced by an increase in FOTO score by 10 points or more.  Baseline: 23/100 with target of 100  11/09/21: 59/100  11/29/21: 56/100 Goal status: Achieved    2.  Patient will exhibit symmetrical AROM between right and left shoulder to perform reaching, lifting and overhead tasks to carry out ADLs. Baseline: Shoulder Flex R/L 180/ PROM70, Shoulder Abd R/L 180/ PROM 80, Shoulder ER R/L 90/ PROM 30  10/10/21: Shoulder Flex R/L 120/30 (120 on LUE with PROM), Shoulder Abduction AROM R/L 120/80,  Shoulder ER AROM R/L 90/40 11/07/21: Shoulder Flex R/L AROM 160/160, Shoulder Abd AROM R/L 160/120 , Shoulder Abd L PROM 160 11/24/21: Shoulder Abd AROM R/L 160/160 (Need for consistency) 01/03/22: Shoulder flexion and abduction between right and left symmetrical  Goal status: Achieved    3.  Patient will exhibit symmetrical  shoulder strength between right and left shoulder perform reaching, lifting and overhead tasks to carry out ADLs. Baseline: Unable to perform many tasks 11/24/21: Unable to reach bra strap through internal rotation and abduct in non scaption plane  Goal status: Partially achieved      PLAN: PT FREQUENCY: 2x/week   PT DURATION: 10 weeks   PLANNED INTERVENTIONS: Therapeutic exercises, Neuromuscular re-education, Patient/Family education, Self Care,  Joint mobilization, Joint manipulation, DME instructions, Dry Needling, Electrical stimulation, Cryotherapy, Moist heat, scar mobilization, Manual therapy, and Re-evaluation   PLAN FOR NEXT SESSION:  Progress RTC and parascapular strengthening exercises: progress T's and rows. Soft tissue on teres major and biceps and lat.   Bradly Chris PT, DPT  Physical Therapist- [employer]  01/05/22, 3:03 PM

## 2022-01-04 ENCOUNTER — Telehealth: Payer: Self-pay

## 2022-01-04 LAB — ANATOMIC PATHOLOGY REPORT

## 2022-01-04 NOTE — Therapy (Addendum)
OUTPATIENT PHYSICAL THERAPY PROGRESS NOTE     Patient Name: Tina Stephenson MRN: [id number] DOB:1955-05-26, 66 y.o., female Today's Date: 01/04/2022  PCP: Delsa Grana PA-C  REFERRING PROVIDER: Dwana Melena PA-C  END OF SESSION:   PT End of Session - 01/03/22 1715     Visit Number 30    Number of Visits 40    Date for PT Re-Evaluation 01/10/22    Authorization Type [employer] [id number]    Authorization Time Period 11/10/21-01/10/22    Authorization - Visit Number 66    Authorization - Number of Visits 40    Progress Note Due on Visit 30    PT Start Time 3557    PT Stop Time 1755    PT Time Calculation (min) 45 min    Activity Tolerance Patient tolerated treatment well               Past Medical History:  Diagnosis Date   Bronchitis    Bruised toe 12/20/2020   Cervical polyp 08/22/2018   Depression    Diabetes mellitus without complication ([place])    Gallbladder polyp    GERD (gastroesophageal reflux disease)    History of cyst of breast 03/25/2018   Had removal of 3 cysts of breasts in approx 2008-2012.   Hypertension    Hypertensive disorder 03/05/2015   Liver tumor (benign)    Nocturia 08/22/2018   Nontraumatic complete tear of left rotator cuff 08/15/2021   Nontraumatic complete tear of right rotator cuff 07/29/2021   Sacrum and coccyx fracture ([place])    Sacrum and coccyx fracture, sequela 02/21/2018   Occurred in [place] at age 11, still have residual in bilateral low back.   Sciatica    Tendinopathy of right biceps tendon 09/24/2020   Traumatic tear of supraspinatus tendon of left shoulder 09/24/2020   Traumatic tear of supraspinatus tendon of right shoulder 09/24/2020   Past Surgical History:  Procedure Laterality Date   APPENDECTOMY     NASAL SEPTUM SURGERY     VASECTOMY     WISDOM TOOTH EXTRACTION     Patient Active Problem List   Diagnosis Date Noted   Low back pain 11/07/2021   Class 2 obesity with body mass index (BMI) of 38.0 to 38.9 in adult 11/07/2021    Leg swelling 08/02/2020   Dyslipidemia 01/05/2020   Type 2 diabetes mellitus without complication, without long-term current use of insulin ([place]) 08/22/2018   Scoliosis 03/25/2018   Onychomycosis 02/21/2018   Vitamin D deficiency 03/19/2015   Essential hypertension 03/05/2015    REFERRING DIAG: Z98.890 (ICD-10-CM) - H/O repair of rotator cuff  THERAPY DIAG:  Pain in left upper arm  S/P left rotator cuff repair  Rationale for Evaluation and Treatment Rehabilitation  PERTINENT HISTORY: S/p left rtc repair and subacrominal decompression on 08/18/21. H/o same surgery on right shoulder several years ago with successful recovery. She has been icing repeatedly throughout the day for pain and swelling management.   PRECAUTIONS:   SUBJECTIVE: Pt reports that she continues to feel decreased pain in her left shoulder. She is still having difficulty with internal rotation.   PAIN:  1-2/10 NPS in left shoulder     OBJECTIVE: (objective measures completed at initial evaluation unless otherwise dated)  VITALS: BP 125/67 HR 80 SpO2 100     DIAGNOSTIC FINDINGS:  CLINICAL DATA:  Shoulder pain   EXAM: RIGHT SHOULDER - 3 VIEW   COMPARISON:  None.   FINDINGS: There is no evidence of fracture or  dislocation. Mild degenerative changes of the acromioclavicular joint. Bone anchors of the proximal humerus. Soft tissues are unremarkable.   IMPRESSION: No acute osseous abnormality.     Electronically Signed   By: Yetta Glassman M.D.   On: 02/15/2021 12:15     PATIENT SURVEYS:  FOTO 25/100 with target of 53   11/29/21: 56/100   COGNITION: Overall cognitive status: Within functional limits for tasks assessed                                  SENSATION: WFL   POSTURE: Normal posture    UPPER EXTREMITY ROM:      Cervical: Not performed during eval AROM  Flex    45 Ext      45  Lat Side Bend R/L 45/45 Rotation       R/L     60/60       Active ROM Right eval Left Eval/  All PROM Left 11/09/21 Left  11/22/21 A/PROM  Shoulder flexion 180 70* 160 160  Shoulder extension        Shoulder abduction 180  80* 120* 120/160  Shoulder adduction        Shoulder internal rotation 70 70    Shoulder external rotation 90 30* 60*   Elbow flexion 150 150    Elbow extension 60 60    Wrist flexion 80 80    Wrist extension 70 70    Wrist ulnar deviation 30 30    Wrist radial deviation 20 20    Wrist pronation        Wrist supination        (Blank rows = not tested)             UPPER EXTREMITY MMT:   MMT Left  10/05/21  Right eval  Shoulder flexion 3-     Shoulder extension      Shoulder abduction  3+    Shoulder adduction      Shoulder internal rotation  3    Shoulder external rotation 3+     Middle trapezius      Lower trapezius      Elbow flexion      Elbow extension      Wrist flexion  4+  Wrist extension   4+  Wrist ulnar deviation   4+  Wrist radial deviation   4+  Wrist pronation      Wrist supination      Grip strength (lbs)   Fair   (Blank rows = not tested)     JOINT MOBILITY TESTING:  Not performed    PALPATION:  Not performed              TODAY'S TREATMENT:  01/04/22: UBE level 7 for seat with resistance level 2 for 5 min   Shoulder Abduction R/L 140/140  Shoulder Flexion R/L 160/160   FOTO: 62/100 with target of 52   Biceps Stretch in Standing on LUE 2 x 30 sec  Biceps Stretch on LUE with 3 lb DB in supine 2 x 30 sec  -Palpation of left biceps feels increased tension  Left Shoulder IR combined - T12 and Right Shoulder IR combined bra strap     Side Lying Sleeper Stretch 2 x 30 sec  -Pt expresses difficulty feeling stretch in this position  Standing Sleeper Stretch 2 x 30 sec    12/29/21:  UBE level 7 for  seat with resistance level 2 for 5 min  Side Lying Left Shoulder ER #3 1 x 10 Side Lying Left Shoulder ER # 2 2 x 10 Shoulder ER with LUE #2 DB 3 x 10  Wall ladder climbs with bound wrists with yellow band 2 x  10 Serratus Slides with pillow case and protracted scapulae 2 x 5   12/26/21:  Shoulder Flexion/Extension wall walks on LUE 1 x 10  Shoulder Abduction/Adduction wall walks on LUE 1 x 10 -min VC to perform stretch at terminal flexion ROM   Shoulder AAROM with wand and #5 AW 1 x 10 -Pt reports shooting pain on anterior and lateral surface of left shoulder  Side Lying Shoulder Flexion LUE  1 x 10 Side Lying Shoulder Flexion LUE #1 DB 2 x 10  Supine left shoulder abduction AROM 2 x 10  -Pt needs to terminate due to increased pain in anterior surface of shoulder joint   12/22/21:  THEREX:   UBE L4 resistance 2.5 min forward, 2.5 min backward for warm up   Seated:  L shoulder flexion and abduction: 2x20/plane  3 # DB behind back passes for shoulder IR and extension: 1x20 CW and CCW  L shoulder rhythmic stabilizations in flexion and abduction: 3x30 sec at distal elbow and at wrist level for RTC stabilization  L shoulder at 90 deg abduction on mat table: GTB, L Shoulder ER/IR, 3x8   RTB scap retractions for light periscapular activation: 2x12   PATIENT EDUCATION: Education details: form and technique for appropriate exercise and explanation of plan of care.  Person educated: Patient Education method: Customer service manager Education comprehension: verbalized understanding and returned demonstration     HOME EXERCISE PROGRAM: Access Code: [email or web address] URL: [email or web address] Date: 01/04/2022 Prepared by: Bradly Chris  Exercises - Seated Gentle Upper Trapezius Stretch  - 1 x daily - 3 reps - 30 hold - Standing Sleeper Stretch at Wall  - 1 x daily - 3 reps - 30-60 sec  hold - Bicep Stretch at Table  - 1 x daily - 3 reps - 30-60 sec  hold - Prone Shoulder Horizontal Abduction  - 3 x weekly - 3 sets - 10 reps - Prone Shoulder Extension with Dumbbells  - 3 x weekly - 3 sets - 10 reps - Prone Shoulder Row  - 3 x weekly - 3 sets - 10 reps - Prone Single Arm  Shoulder Y  - 3 x weekly - 3 sets - 10 reps - Standing Shoulder Flexion Wall Walk  - 1 x daily - 10 reps - Supine Shoulder External Rotation Stretch  - 1 x daily - 3 reps - 30-60 sec  hold - Serratus Activation at Wall  - 1 x daily - 7 x weekly - 3 sets - 10 reps - Sidelying Shoulder ER with Towel and Dumbbell  - 1 x daily - 7 x weekly - 3 sets - 10 reps - Seated Shoulder External Rotation in Abduction Supported with Dumbbell  - 3 x weekly - 3 sets - 10 reps ASSESSMENT:   CLINICAL IMPRESSION:  Pt presents for progress note and she shows an improvement in shoulder ROM and self perception of function. She continues to have limitations with left shoulder IR that is limiting her ability to adjust her bra strap. IR ROM likely limited by muscular restriction in biceps and surrounding RTC musculature as evidence by increased muscle tension in left biceps along with increased tension with sleeper stretch. She will continue to  benefit from skilled PT to progress left shoulder AROM and strength to return to performing overhead activities for recreational gardening and self care tasks.   OBJECTIVE IMPAIRMENTS decreased ROM, decreased strength, impaired UE functional use, and pain.    ACTIVITY LIMITATIONS lifting, dressing, reach over head, and hygiene/grooming   PARTICIPATION LIMITATIONS: cleaning, laundry, driving, and shopping   PERSONAL FACTORS  Diabetes mellitus  are also affecting patient's functional outcome.    REHAB POTENTIAL: Good   CLINICAL DECISION MAKING: Stable/uncomplicated   EVALUATION COMPLEXITY: Low     GOALS: Goals reviewed with patient? No   SHORT TERM GOALS: Target date: 09/14/2021     Pt will be independent with HEP in order to improve strength and balance in order to decrease fall risk and improve function at home and work. Baseline:  Doing exercises independently  Goal status: Achieved    2.  Patient will be able to remove left arm from sling for increased mobility and  as a sign of healing.  Baseline:  Currently not wearing sling  Goal status: Achieved    3.  Patient will achieve full PROM of left shoulder as a sign of successful healing and to progress to week 4-10 of protocol.  Baseline: Shoulder Flex R/L 180/ PROM70, Shoulder Abd R/L 180/ PROM 80, Shoulder ER R/L 90/ PROM 30 11/07/21: Shoulder Flex R/L AROM 160/160, Shoulder Abd AROM R/L 160/90 , Shoulder Abd L PROM 160  11/24/21:  Left Shoulder Abduction 160 01/03/22: Symmetrical movement on right and left shoulder  Goal status: Achieved        LONG TERM GOALS: Target date: 11/09/2021  (Remove Blue Hyperlink)   Patient will have improved function and activity level as evidenced by an increase in FOTO score by 10 points or more.  Baseline: 23/100 with target of 100  11/09/21: 59/100  11/29/21: 56/100 Goal status: Achieved    2.  Patient will exhibit symmetrical AROM between right and left shoulder to perform reaching, lifting and overhead tasks to carry out ADLs. Baseline: Shoulder Flex R/L 180/ PROM70, Shoulder Abd R/L 180/ PROM 80, Shoulder ER R/L 90/ PROM 30  10/10/21: Shoulder Flex R/L 120/30 (120 on LUE with PROM), Shoulder Abduction AROM R/L 120/80,  Shoulder ER AROM R/L 90/40 11/07/21: Shoulder Flex R/L AROM 160/160, Shoulder Abd AROM R/L 160/120 , Shoulder Abd L PROM 160 11/24/21: Shoulder Abd AROM R/L 160/160 (Need for consistency) 01/04/22: Symmetrical for both abduction or flexion  Goal status: Achieved    3.  Patient will exhibit symmetrical shoulder strength between right and left shoulder perform reaching, lifting and overhead tasks to carry out ADLs. Baseline: Unable to perform many tasks 11/24/21: Unable to reach bra strap through internal rotation and abduct in non scaption plane 01/03/22: Left shoulder IR- able to reach T12 and Right shoulder IR able to reach bra strap Goal status: Partially achieved      PLAN: PT FREQUENCY: 2x/week   PT DURATION: 10 weeks   PLANNED INTERVENTIONS:  Therapeutic exercises, Neuromuscular re-education, Patient/Family education, Self Care, Joint mobilization, Joint manipulation, DME instructions, Dry Needling, Electrical stimulation, Cryotherapy, Moist heat, scar mobilization, Manual therapy, and Re-evaluation   PLAN FOR NEXT SESSION:   Soft tissue work on left biceps. Front and lateral raises. Stretching of RTC and surrounding left shoulder musculature   Bradly Chris PT, DPT  Physical Therapist- [facility]  01/04/22, 9:12 AM

## 2022-01-04 NOTE — Telephone Encounter (Signed)
Discussed pathology results. Patient voiced understanding.  

## 2022-01-04 NOTE — Telephone Encounter (Signed)
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  SEE COMMENTS. Specimen 2-Skin Biopsy, Right Upper Back Paraspinal: COMPOUND MELANOCYTIC NEVUS.  SEE COMMENTS. Specimen 3-Skin Biopsy, Right Middle Medial Scapula: COMPOUND MELANOCYTIC NEVUS.  1- Mild dysplastic Recheck next visit 2 & 3 - both benign moles No further treatment needed

## 2022-01-05 ENCOUNTER — Ambulatory Visit: Payer: Managed Care, Other (non HMO) | Admitting: Physical Therapy

## 2022-01-05 DIAGNOSIS — M6281 Muscle weakness (generalized): Secondary | ICD-10-CM

## 2022-01-05 DIAGNOSIS — Z9889 Other specified postprocedural states: Secondary | ICD-10-CM

## 2022-01-05 DIAGNOSIS — M79622 Pain in left upper arm: Secondary | ICD-10-CM

## 2022-01-06 ENCOUNTER — Encounter: Payer: Self-pay | Admitting: Dermatology

## 2022-01-11 ENCOUNTER — Ambulatory Visit: Payer: Managed Care, Other (non HMO) | Attending: Physician Assistant | Admitting: Physical Therapy

## 2022-01-11 ENCOUNTER — Encounter: Payer: Self-pay | Admitting: Physical Therapy

## 2022-01-11 DIAGNOSIS — M79622 Pain in left upper arm: Secondary | ICD-10-CM | POA: Diagnosis not present

## 2022-01-11 DIAGNOSIS — M6281 Muscle weakness (generalized): Secondary | ICD-10-CM | POA: Diagnosis present

## 2022-01-11 DIAGNOSIS — Z9889 Other specified postprocedural states: Secondary | ICD-10-CM | POA: Diagnosis present

## 2022-01-11 NOTE — Addendum Note (Signed)
Addended by: Daneil Dan on: 01/11/2022 05:57 PM   Modules accepted: Orders

## 2022-01-11 NOTE — Therapy (Signed)
OUTPATIENT PHYSICAL THERAPY DISCHARGE NOTE     Patient Name: Tina Stephenson MRN: [id number] DOB:01-05-1956, 67 y.o., female Today's Date: 01/11/2022  PCP: Delsa Grana PA-C  REFERRING PROVIDER: Dwana Melena PA-C  END OF SESSION:   PT End of Session - 01/11/22 1719     Visit Number 32    Number of Visits 40    Date for PT Re-Evaluation 02/11/22    Authorization Type [employer] 2023    Authorization - Number of Visits 40    Progress Note Due on Visit 40   Activity Tolerance Patient tolerated treatment well    Behavior During Therapy [facility] for tasks assessed/performed                 Past Medical History:  Diagnosis Date   Bronchitis    Bruised toe 12/20/2020   Cervical polyp 08/22/2018   Depression    Diabetes mellitus without complication ([place])    Dysplastic nevus 12/27/2021   Left medial mid scapula. Mild atypia   Gallbladder polyp    GERD (gastroesophageal reflux disease)    History of cyst of breast 03/25/2018   Had removal of 3 cysts of breasts in approx 2008-2012.   Hypertension    Hypertensive disorder 03/05/2015   Liver tumor (benign)    Nocturia 08/22/2018   Nontraumatic complete tear of left rotator cuff 08/15/2021   Nontraumatic complete tear of right rotator cuff 07/29/2021   Sacrum and coccyx fracture ([place])    Sacrum and coccyx fracture, sequela 02/21/2018   Occurred in [place] at age 46, still have residual in bilateral low back.   Sciatica    Tendinopathy of right biceps tendon 09/24/2020   Traumatic tear of supraspinatus tendon of left shoulder 09/24/2020   Traumatic tear of supraspinatus tendon of right shoulder 09/24/2020   Past Surgical History:  Procedure Laterality Date   APPENDECTOMY     NASAL SEPTUM SURGERY     VASECTOMY     WISDOM TOOTH EXTRACTION     Patient Active Problem List   Diagnosis Date Noted   Low back pain 11/07/2021   Class 2 obesity with body mass index (BMI) of 38.0 to 38.9 in adult 11/07/2021   Leg swelling  08/02/2020   Dyslipidemia 01/05/2020   Type 2 diabetes mellitus without complication, without long-term current use of insulin ([place]) 08/22/2018   Scoliosis 03/25/2018   Onychomycosis 02/21/2018   Vitamin D deficiency 03/19/2015   Essential hypertension 03/05/2015    REFERRING DIAG: Z98.890 (ICD-10-CM) - H/O repair of rotator cuff  THERAPY DIAG:  Pain in left upper arm  S/P left rotator cuff repair  Muscle weakness (generalized)  Rationale for Evaluation and Treatment Rehabilitation  PERTINENT HISTORY: S/p left rtc repair and subacrominal decompression on 08/18/21. H/o same surgery on right shoulder several years ago with successful recovery. She has been icing repeatedly throughout the day for pain and swelling management.   PRECAUTIONS:   SUBJECTIVE: Pt reports that she continues to feel an improvement in her right shoulder especially after performing sleeper and biceps stretch.   PAIN:  1-2/10 NPS in left shoulder     OBJECTIVE: (objective measures completed at initial evaluation unless otherwise dated)  VITALS: BP 125/67 HR 80 SpO2 100     DIAGNOSTIC FINDINGS:  CLINICAL DATA:  Shoulder pain   EXAM: RIGHT SHOULDER - 3 VIEW   COMPARISON:  None.   FINDINGS: There is no evidence of fracture or dislocation. Mild degenerative changes of the acromioclavicular joint. Bone anchors of  the proximal humerus. Soft tissues are unremarkable.   IMPRESSION: No acute osseous abnormality.     Electronically Signed   By: Yetta Glassman M.D.   On: 02/15/2021 12:15     PATIENT SURVEYS:  FOTO 25/100 with target of 53   11/29/21: 56/100   COGNITION: Overall cognitive status: Within functional limits for tasks assessed                                  SENSATION: WFL   POSTURE: Normal posture    UPPER EXTREMITY ROM:      Cervical: Not performed during eval AROM  Flex    45 Ext      45  Lat Side Bend R/L 45/45 Rotation       R/L     60/60       Active ROM  Right eval Left Eval/ All PROM Left 11/09/21 Left  11/22/21 A/PROM  Shoulder flexion 180 70* 160 160  Shoulder extension        Shoulder abduction 180  80* 120* 120/160  Shoulder adduction        Shoulder internal rotation 70 70    Shoulder external rotation 90 30* 60*   Elbow flexion 150 150    Elbow extension 60 60    Wrist flexion 80 80    Wrist extension 70 70    Wrist ulnar deviation 30 30    Wrist radial deviation 20 20    Wrist pronation        Wrist supination        (Blank rows = not tested)             UPPER EXTREMITY MMT:   MMT Left  10/05/21  Right eval  Shoulder flexion 3-     Shoulder extension      Shoulder abduction  3+    Shoulder adduction      Shoulder internal rotation  3    Shoulder external rotation 3+     Middle trapezius      Lower trapezius      Elbow flexion      Elbow extension      Wrist flexion  4+  Wrist extension   4+  Wrist ulnar deviation   4+  Wrist radial deviation   4+  Wrist pronation      Wrist supination      Grip strength (lbs)   Fair   (Blank rows = not tested)     JOINT MOBILITY TESTING:  Not performed    PALPATION:  Not performed              TODAY'S TREATMENT:  01/11/22:  THEREX  UBE level 7 for seat with resistance level 2 for 5 min  Reviewed HEP and modified exercises accordingly  Left Shoulder Combined ER is symmetrical with RUE   MANUAL  Left biceps massage  Left pec trigger point release and massage   01/05/22:   THEREX  UBE level 7 for seat with resistance level 2 for 5 min  Left Bicep Curls 3 x 30 sec  Pec Stretch 60 deg 3 x 30 sec  Pec Stretch 90 deg 3 x 30 sec  Lat Stretch on LUE 3 x 30 sec    MANUAL THERAPY  Soft tissue trigger point release of left biceps  Biceps stretch on left 2 x 30 sec   01/03/22: UBE level 7  for seat with resistance level 2 for 5 min  Shoulder Flex AROM  R/L 160/160  Shoulder Abd AROM R/L 140/140   Standing Shoulder combined IR stretch 3 x 30 sec  -Pt  reports increased pain in shoulder joint along with biceps stiffness   Standing Biceps Stretch on LUE 3 x 30 sec   Supine Biceps stretch on LUE 3 x 30 sec with # 3 weight   Sleeper Stretch Side Lying 2 x 30 sec -Pt unable to feel stretch in back of shoulder   Sleeper Stretch in standing on LUE 3 x 30 sec    12/29/21:  UBE level 7 for seat with resistance level 2 for 5 min  Side Lying Left Shoulder ER #3 1 x 10 Side Lying Left Shoulder ER # 2 2 x 10 Shoulder ER with LUE #2 DB 3 x 10  Wall ladder climbs with bound wrists with yellow band 2 x 10 Serratus Slides with pillow case and protracted scapulae 2 x 5   12/26/21:  Shoulder Flexion/Extension wall walks on LUE 1 x 10  Shoulder Abduction/Adduction wall walks on LUE 1 x 10 -min VC to perform stretch at terminal flexion ROM   Shoulder AAROM with wand and #5 AW 1 x 10 -Pt reports shooting pain on anterior and lateral surface of left shoulder  Side Lying Shoulder Flexion LUE  1 x 10 Side Lying Shoulder Flexion LUE #1 DB 2 x 10  Supine left shoulder abduction AROM 2 x 10  -Pt needs to terminate due to increased pain in anterior surface of shoulder joint     PATIENT EDUCATION: Education details: form and technique for appropriate exercise and explanation of plan of care.  Person educated: Patient Education method: Customer service manager Education comprehension: verbalized understanding and returned demonstration     HOME EXERCISE PROGRAM: Access Code: [email or web address] URL: [email or web address] Date: 01/11/2022 Prepared by: Bradly Chris  Exercises - Seated Gentle Upper Trapezius Stretch  - 1 x daily - 3 reps - 30 hold - Standing Sleeper Stretch at [place] 1 x daily - 3 reps - 30-60 sec  hold - Bicep Stretch at Table  - 1 x daily - 3 reps - 30-60 sec  hold - Prone Shoulder Horizontal Abduction  - 3 x weekly - 3 sets - 10 reps - Prone Shoulder Extension with Dumbbells  - 3 x weekly - 3 sets - 10 reps -  Prone Shoulder Row  - 3 x weekly - 3 sets - 10 reps - Prone Single Arm Shoulder Y  - 3 x weekly - 3 sets - 10 reps - Seated Shoulder External Rotation in Abduction Supported with Dumbbell  - 3 x weekly - 3 sets - 10 reps - Standing Bicep Curl with Dumbbells  - 3 x weekly - 3 sets - 10 reps - Doorway Pec Stretch at 90 Degrees Abduction  - 1 x daily - 3 reps - 30 sec  hold - Doorway Pec Stretch at 60 Elevation  - 1 x daily - 3 reps - 60 sec  hold - Latissimus Dorsi Stretch at Wall  - 1 x daily - 3 reps - 30 sec hold - Doorway Pec Stretch at 120 Degrees Abduction  - 1 x daily - 3 reps - 30-60 sec  hold  ASSESSMENT:   CLINICAL IMPRESSION: Continue POC with focus on improving left biceps extensibility and left shoulder ER ROM. Pt able to perform all exercises without an increase in her pain. She  continues to show left shoulder ROM limitations with ER likely due to decreased lat length and pec length from prolonged position of left arm in sling. She will continue to benefit from skilled PT to progress left shoulder AROM and strength to return to performing overhead activities for recreational gardening and self care tasks.   OBJECTIVE IMPAIRMENTS decreased ROM, decreased strength, impaired UE functional use, and pain.    ACTIVITY LIMITATIONS lifting, dressing, reach over head, and hygiene/grooming   PARTICIPATION LIMITATIONS: cleaning, laundry, driving, and shopping   PERSONAL FACTORS  Diabetes mellitus  are also affecting patient's functional outcome.    REHAB POTENTIAL: Good   CLINICAL DECISION MAKING: Stable/uncomplicated   EVALUATION COMPLEXITY: Low     GOALS: Goals reviewed with patient? No   SHORT TERM GOALS: Target date: 09/14/2021     Pt will be independent with HEP in order to improve strength and balance in order to decrease fall risk and improve function at home and work. Baseline:  Doing exercises independently  Goal status: Achieved    2.  Patient will be able to remove  left arm from sling for increased mobility and as a sign of healing.  Baseline:  Currently not wearing sling  Goal status: Achieved    3.  Patient will achieve full PROM of left shoulder as a sign of successful healing and to progress to week 4-10 of protocol.  Baseline: Shoulder Flex R/L 180/ PROM70, Shoulder Abd R/L 180/ PROM 80, Shoulder ER R/L 90/ PROM 30 11/07/21: Shoulder Flex R/L AROM 160/160, Shoulder Abd AROM R/L 160/90 , Shoulder Abd L PROM 160  11/24/21:  Left Shoulder Abduction 160  01/03/22: Shoulder Abd and Flexion are symmetrical  Goal status: Achieved        LONG TERM GOALS: Target date: 11/09/2021  (Remove Blue Hyperlink)   Patient will have improved function and activity level as evidenced by an increase in FOTO score by 10 points or more.  Baseline: 23/100 with target of 100  11/09/21: 59/100  11/29/21: 56/100 Goal status: Achieved    2.  Patient will exhibit symmetrical AROM between right and left shoulder to perform reaching, lifting and overhead tasks to carry out ADLs. Baseline: Shoulder Flex R/L 180/ PROM70, Shoulder Abd R/L 180/ PROM 80, Shoulder ER R/L 90/ PROM 30  10/10/21: Shoulder Flex R/L 120/30 (120 on LUE with PROM), Shoulder Abduction AROM R/L 120/80,  Shoulder ER AROM R/L 90/40 11/07/21: Shoulder Flex R/L AROM 160/160, Shoulder Abd AROM R/L 160/120 , Shoulder Abd L PROM 160 11/24/21: Shoulder Abd AROM R/L 160/160 (Need for consistency) 01/03/22: Shoulder flexion and abduction between right and left symmetrical  Goal status: Achieved    3.  Patient will exhibit symmetrical shoulder strength between right and left shoulder perform reaching, lifting and overhead tasks to carry out ADLs. Baseline: Unable to perform many tasks 11/24/21: Unable to reach bra strap through internal rotation and abduct in non scaption plane 01/11/22: All planes are symmetrical; Left Shoulder Combined ER is symmetrical with RUE  Goal status: Achieved   PLAN: PT FREQUENCY: 2x/week   PT  DURATION: 10 weeks   PLANNED INTERVENTIONS: Therapeutic exercises, Neuromuscular re-education, Patient/Family education, Self Care, Joint mobilization, Joint manipulation, DME instructions, Dry Needling, Electrical stimulation, Cryotherapy, Moist heat, scar mobilization, Manual therapy, and Re-evaluation   PLAN FOR NEXT SESSION:  Progress RTC and parascapular strengthening exercises: progress T's and rows. Soft tissue on teres major and biceps and lat.   Bradly Chris PT, DPT  Physical Therapist-  [facility]  01/11/22, 5:20 PM

## 2022-01-16 ENCOUNTER — Ambulatory Visit: Payer: Managed Care, Other (non HMO) | Admitting: Physical Therapy

## 2022-01-18 ENCOUNTER — Ambulatory Visit: Payer: Managed Care, Other (non HMO) | Admitting: Physical Therapy

## 2022-01-19 ENCOUNTER — Ambulatory Visit: Payer: Managed Care, Other (non HMO) | Admitting: Orthopaedic Surgery

## 2022-01-19 DIAGNOSIS — Z9889 Other specified postprocedural states: Secondary | ICD-10-CM | POA: Diagnosis not present

## 2022-01-19 NOTE — Progress Notes (Signed)
Post-Op Visit Note   Patient: Tina Stephenson           Date of Birth: July 19, 1955           MRN: [id number] Visit Date: 01/19/2022 PCP: Delsa Grana, PA-C   Assessment & Plan:  Chief Complaint:  Chief Complaint  Patient presents with   Left Shoulder - Follow-up   Visit Diagnoses:  1. S/P arthroscopy of left shoulder   2. S/P left rotator cuff repair     Plan: Patient is a pleasant 67 old female comes in today about 5 months status post left shoulder arthroscopic debridement rotator cuff repair 08/18/2021.  She has been doing well.  Minimal to no pain.  She has finished formal physical therapy and is working on a home exercise program.  Currently building strength and endurance.  Examination of her left shoulder reveals full active range of motion in all planes.  5 out of 5 strength.  She is neurovascularly intact distally.  At this point, she will continue with her home exercise program.  She will follow-up with [place] as needed.  Call with concerns or questions.  Follow-Up Instructions: Return if symptoms worsen or fail to improve.   Orders:  No orders of the defined types were placed in this encounter.  No orders of the defined types were placed in this encounter.   Imaging: No new imaging  PMFS History: Patient Active Problem List   Diagnosis Date Noted   Low back pain 11/07/2021   Class 2 obesity with body mass index (BMI) of 38.0 to 38.9 in adult 11/07/2021   Leg swelling 08/02/2020   Dyslipidemia 01/05/2020   Type 2 diabetes mellitus without complication, without long-term current use of insulin ([place]) 08/22/2018   Scoliosis 03/25/2018   Onychomycosis 02/21/2018   Vitamin D deficiency 03/19/2015   Essential hypertension 03/05/2015   Past Medical History:  Diagnosis Date   Bronchitis    Bruised toe 12/20/2020   Cervical polyp 08/22/2018   Depression    Diabetes mellitus without complication ([place])    Dysplastic nevus 12/27/2021   Left medial mid scapula. Mild  atypia   Gallbladder polyp    GERD (gastroesophageal reflux disease)    History of cyst of breast 03/25/2018   Had removal of 3 cysts of breasts in approx 2008-2012.   Hypertension    Hypertensive disorder 03/05/2015   Liver tumor (benign)    Nocturia 08/22/2018   Nontraumatic complete tear of left rotator cuff 08/15/2021   Nontraumatic complete tear of right rotator cuff 07/29/2021   Sacrum and coccyx fracture ([place])    Sacrum and coccyx fracture, sequela 02/21/2018   Occurred in [place] at age 20, still have residual in bilateral low back.   Sciatica    Tendinopathy of right biceps tendon 09/24/2020   Traumatic tear of supraspinatus tendon of left shoulder 09/24/2020   Traumatic tear of supraspinatus tendon of right shoulder 09/24/2020    Family History  Problem Relation Age of Onset   COPD Mother    Cancer Mother    Heart disease Father    Breast cancer Paternal Aunt    Diabetes Brother    Diabetes Maternal Grandfather    Ovarian cancer Neg Hx    Colon cancer Neg Hx     Past Surgical History:  Procedure Laterality Date   APPENDECTOMY     NASAL SEPTUM SURGERY     VASECTOMY     WISDOM TOOTH EXTRACTION     Social History  Occupational History   Not on file  Tobacco Use   Smoking status: Never   Smokeless tobacco: Never  Vaping Use   Vaping Use: Never used  Substance and Sexual Activity   Alcohol use: Never   Drug use: Never   Sexual activity: Not Currently    Birth control/protection: None

## 2022-01-23 ENCOUNTER — Ambulatory Visit: Payer: Managed Care, Other (non HMO) | Admitting: Physical Therapy

## 2022-01-25 ENCOUNTER — Encounter: Payer: Managed Care, Other (non HMO) | Admitting: Physical Therapy

## 2022-01-30 ENCOUNTER — Ambulatory Visit: Payer: Managed Care, Other (non HMO) | Admitting: Dermatology

## 2022-01-30 ENCOUNTER — Encounter: Payer: Managed Care, Other (non HMO) | Admitting: Physical Therapy

## 2022-01-30 DIAGNOSIS — L821 Other seborrheic keratosis: Secondary | ICD-10-CM

## 2022-01-30 DIAGNOSIS — D229 Melanocytic nevi, unspecified: Secondary | ICD-10-CM | POA: Diagnosis not present

## 2022-01-30 DIAGNOSIS — L700 Acne vulgaris: Secondary | ICD-10-CM | POA: Diagnosis not present

## 2022-01-30 DIAGNOSIS — Z86018 Personal history of other benign neoplasm: Secondary | ICD-10-CM

## 2022-01-30 DIAGNOSIS — L820 Inflamed seborrheic keratosis: Secondary | ICD-10-CM

## 2022-01-30 NOTE — Patient Instructions (Signed)
Due to recent changes in healthcare laws, you may see results of your pathology and/or laboratory studies on MyChart before the doctors have had a chance to review them. We understand that in some cases there may be results that are confusing or concerning to you. Please understand that not all results are received at the same time and often the doctors may need to interpret multiple results in order to provide you with the best plan of care or course of treatment. Therefore, we ask that you please give us 2 business days to thoroughly review all your results before contacting the office for clarification. Should we see a critical lab result, you will be contacted sooner.   If You Need Anything After Your Visit  If you have any questions or concerns for your doctor, please call our main line at [phone] and press option 4 to reach your doctor's medical assistant. If no one answers, please leave a voicemail as directed and we will return your call as soon as possible. Messages left after 4 pm will be answered the following business day.   You may also send us a message via MyChart. We typically respond to MyChart messages within 1-2 business days.  For prescription refills, please ask your pharmacy to contact our office. Our fax number is [phone].  If you have an urgent issue when the clinic is closed that cannot wait until the next business day, you can page your doctor at the number below.    Please note that while we do our best to be available for urgent issues outside of office hours, we are not available 24/7.   If you have an urgent issue and are unable to reach us, you may choose to seek medical care at your doctor's office, retail clinic, urgent care center, or emergency room.  If you have a medical emergency, please immediately call 911 or go to the emergency department.  Pager Numbers  - Dr. Kowalski: [phone]  - Dr. Moye: [phone]  - Dr. Stewart:  [phone]  In the event of inclement weather, please call our main line at [phone] for an update on the status of any delays or closures.  Dermatology Medication Tips: Please keep the boxes that topical medications come in in order to help keep track of the instructions about where and how to use these. Pharmacies typically print the medication instructions only on the boxes and not directly on the medication tubes.   If your medication is too expensive, please contact our office at [phone] option 4 or send us a message through MyChart.   We are unable to tell what your co-pay for medications will be in advance as this is different depending on your insurance coverage. However, we may be able to find a substitute medication at lower cost or fill out paperwork to get insurance to cover a needed medication.   If a prior authorization is required to get your medication covered by your insurance company, please allow us 1-2 business days to complete this process.  Drug prices often vary depending on where the prescription is filled and some pharmacies may offer cheaper prices.  The website www.goodrx.com contains coupons for medications through different pharmacies. The prices here do not account for what the cost may be with help from insurance (it may be cheaper with your insurance), but the website can give you the price if you did not use any insurance.  - You can print the associated coupon and take it with   your prescription to the pharmacy.  - You may also stop by our office during regular business hours and pick up a GoodRx coupon card.  - If you need your prescription sent electronically to a different pharmacy, notify our office through [facility] MyChart or by phone at [phone] option 4.     Si Usted Necesita Algo Despus de Su Visita  Tambin puede enviarnos un mensaje a travs de MyChart. Por lo general respondemos a los mensajes de MyChart en el transcurso de 1 a 2  das hbiles.  Para renovar recetas, por favor pida a su farmacia que se ponga en contacto con nuestra oficina. Nuestro nmero de fax es el [phone].  Si tiene un asunto urgente cuando la clnica est cerrada y que no puede esperar hasta el siguiente da hbil, puede llamar/localizar a su doctor(a) al nmero que aparece a continuacin.   Por favor, tenga en cuenta que aunque hacemos todo lo posible para estar disponibles para asuntos urgentes fuera del horario de oficina, no estamos disponibles las 24 horas del da, los 7 das de la semana.   Si tiene un problema urgente y no puede comunicarse con nosotros, puede optar por buscar atencin mdica  en el consultorio de su doctor(a), en una clnica privada, en un centro de atencin urgente o en una sala de emergencias.  Si tiene una emergencia mdica, por favor llame inmediatamente al 911 o vaya a la sala de emergencias.  Nmeros de bper  - Dr. Kowalski: [phone]  - Dra. Moye: [phone]  - Dra. Stewart: [phone]  En caso de inclemencias del tiempo, por favor llame a nuestra lnea principal al [phone] para una actualizacin sobre el estado de cualquier retraso o cierre.  Consejos para la medicacin en dermatologa: Por favor, guarde las cajas en las que vienen los medicamentos de uso tpico para ayudarle a seguir las instrucciones sobre dnde y cmo usarlos. Las farmacias generalmente imprimen las instrucciones del medicamento slo en las cajas y no directamente en los tubos del medicamento.   Si su medicamento es muy caro, por favor, pngase en contacto con nuestra oficina llamando al [phone] y presione la opcin 4 o envenos un mensaje a travs de MyChart.   No podemos decirle cul ser su copago por los medicamentos por adelantado ya que esto es diferente dependiendo de la cobertura de su seguro. Sin embargo, es posible que podamos encontrar un medicamento sustituto a menor costo o llenar un formulario para que el  seguro cubra el medicamento que se considera necesario.   Si se requiere una autorizacin previa para que su compaa de seguros cubra su medicamento, por favor permtanos de 1 a 2 das hbiles para completar este proceso.  Los precios de los medicamentos varan con frecuencia dependiendo del lugar de dnde se surte la receta y alguna farmacias pueden ofrecer precios ms baratos.  El sitio web www.goodrx.com tiene cupones para medicamentos de diferentes farmacias. Los precios aqu no tienen en cuenta lo que podra costar con la ayuda del seguro (puede ser ms barato con su seguro), pero el sitio web puede darle el precio si no utiliz ningn seguro.  - Puede imprimir el cupn correspondiente y llevarlo con su receta a la farmacia.  - Tambin puede pasar por nuestra oficina durante el horario de atencin regular y recoger una tarjeta de cupones de GoodRx.  - Si necesita que su receta se enve electrnicamente a una farmacia diferente, informe a nuestra oficina a travs de MyChart de [place]   o por telfono llamando al [phone] y presione la opcin 4.  

## 2022-01-30 NOTE — Progress Notes (Signed)
   Follow-Up Visit   Subjective  Tina Stephenson is a 67 y.o. female who presents for the following: Irregular skin lesions (Irritated, patient would like treated today). Recheck mildly dysplastic nevus on the L middle medial scapula. The patient has spots, moles and lesions to be evaluated, some may be new or changing.   The following portions of the chart were reviewed this encounter and updated as appropriate:   Tobacco  Allergies  Meds  Problems  Med Hx  Surg Hx  Fam Hx     Review of Systems:  No other skin or systemic complaints except as noted in HPI or Assessment and Plan.  Objective  Well appearing patient in no apparent distress; mood and affect are within normal limits.  A focused examination was performed including the face, trunk, and extremities. Relevant physical exam findings are noted in the Assessment and Plan.  Inframammary x 17, L axilla x 1, R flank x 2, back x 1, R neck x 2, sup forehead x 1 (24) Erythematous stuck-on, waxy papule or plaque  Back x 3 Large comedones.    Assessment & Plan  Inflamed seborrheic keratosis (24) Inframammary x 17, L axilla x 1, R flank x 2, back x 1, R neck x 2, sup forehead x 1 Symptomatic, irritating, patient would like treated. Destruction of lesion - Inframammary x 17, L axilla x 1, R flank x 2, back x 1, R neck x 2, sup forehead x 1 Complexity: simple   Destruction method: cryotherapy   Informed consent: discussed and consent obtained   Timeout:  patient name, date of birth, surgical site, and procedure verified Lesion destroyed using liquid nitrogen: Yes   Region frozen until ice ball extended beyond lesion: Yes   Outcome: patient tolerated procedure well with no complications   Post-procedure details: wound care instructions given    Open comedone Back x 3 Discussed punch excision x 3 to be scheduled at surgery appointment.   Seborrheic Keratoses - Stuck-on, waxy, tan-brown papules and/or plaques  -  Benign-appearing - Discussed benign etiology and prognosis. - Observe - Call for any changes  Melanocytic Nevi - Tan-brown and/or pink-flesh-colored symmetric macules and papules - Benign appearing on exam today - Observation - Call clinic for new or changing moles - Recommend daily use of broad spectrum spf 30+ sunscreen to sun-exposed areas.   History of Dysplastic Nevi - No evidence of recurrence today - Recommend regular full body skin exams - Recommend daily broad spectrum sunscreen SPF 30+ to sun-exposed areas, reapply every 2 hours as needed.  - Call if any new or changing lesions are noted between office visits  Return in about 3 months (around 05/01/2022) for ISK follow up; comedone punch excision x 3 to be scheduled at an appointment slot.  Luther Redo, CMA, am acting as scribe for Sarina Ser, MD . Documentation: I have reviewed the above documentation for accuracy and completeness, and I agree with the above.  Sarina Ser, MD

## 2022-02-01 ENCOUNTER — Encounter: Payer: Managed Care, Other (non HMO) | Admitting: Physical Therapy

## 2022-02-07 ENCOUNTER — Encounter: Payer: Self-pay | Admitting: Dermatology

## 2022-02-07 ENCOUNTER — Ambulatory Visit
Admission: RE | Admit: 2022-02-07 | Discharge: 2022-02-07 | Disposition: A | Discharge status: Home / Self Care | Payer: Managed Care, Other (non HMO) | Source: Ambulatory Visit | Attending: Family Medicine | Admitting: Family Medicine

## 2022-02-07 DIAGNOSIS — Z780 Asymptomatic menopausal state: Secondary | ICD-10-CM | POA: Diagnosis present

## 2022-02-07 DIAGNOSIS — Z1231 Encounter for screening mammogram for malignant neoplasm of breast: Secondary | ICD-10-CM

## 2022-02-07 NOTE — Progress Notes (Signed)
Your bone density results were normal. Please continue any supplements that you are taking as well as staying active and engaging in weight bearing exercise.

## 2022-03-21 ENCOUNTER — Telehealth: Payer: Self-pay

## 2022-03-21 ENCOUNTER — Ambulatory Visit: Payer: Managed Care, Other (non HMO) | Admitting: Dermatology

## 2022-03-21 ENCOUNTER — Telehealth: Payer: Self-pay | Admitting: Family Medicine

## 2022-03-21 VITALS — BP 122/65 | HR 74

## 2022-03-21 DIAGNOSIS — D492 Neoplasm of unspecified behavior of bone, soft tissue, and skin: Secondary | ICD-10-CM

## 2022-03-21 DIAGNOSIS — L720 Epidermal cyst: Secondary | ICD-10-CM | POA: Diagnosis not present

## 2022-03-21 NOTE — Telephone Encounter (Signed)
Pt doing fine after today's surgery./sh 

## 2022-03-21 NOTE — Telephone Encounter (Signed)
Called back no answer left vm to reach back to office and see if there is something I can help with or does Dr.Wilkinson prefers to speak with PCP.

## 2022-03-21 NOTE — Telephone Encounter (Signed)
Copied from Tina Stephenson [id number]. Topic: General - Other >> Mar 21, 2022  3:11 PM Everette C wrote: Reason for CRM: Dr. Yevonne Pax would like to discuss potential medication changes for the patient  Please contact further when possible

## 2022-03-21 NOTE — Patient Instructions (Addendum)
Wound Care Instructions  Cleanse wound gently with soap and water once a day then pat dry with clean gauze. Apply a thin coat of Petrolatum (petroleum jelly, "Vaseline") over the wound (unless you have an allergy to this). We recommend that you use a new, sterile tube of Vaseline. Do not pick or remove scabs. Do not remove the yellow or white "healing tissue" from the base of the wound.  Cover the wound with fresh, clean, nonstick gauze and secure with paper tape. You may use Band-Aids in place of gauze and tape if the wound is small enough, but would recommend trimming much of the tape off as there is often too much. Sometimes Band-Aids can irritate the skin.  You should call the office for your biopsy report after 1 week if you have not already been contacted.  If you experience any problems, such as abnormal amounts of bleeding, swelling, significant bruising, significant pain, or evidence of infection, please call the office immediately.  FOR ADULT SURGERY PATIENTS: If you need something for pain relief you may take 1 extra strength Tylenol (acetaminophen) AND 2 Ibuprofen (200mg each) together every 4 hours as needed for pain. (do not take these if you are allergic to them or if you have a reason you should not take them.) Typically, you may only need pain medication for 1 to 3 days.     Due to recent changes in healthcare laws, you may see results of your pathology and/or laboratory studies on MyChart before the doctors have had a chance to review them. We understand that in some cases there may be results that are confusing or concerning to you. Please understand that not all results are received at the same time and often the doctors may need to interpret multiple results in order to provide you with the best plan of care or course of treatment. Therefore, we ask that you please give us 2 business days to thoroughly review all your results before contacting the office for clarification. Should  we see a critical lab result, you will be contacted sooner.   If You Need Anything After Your Visit  If you have any questions or concerns for your doctor, please call our main line at [phone] and press option 4 to reach your doctor's medical assistant. If no one answers, please leave a voicemail as directed and we will return your call as soon as possible. Messages left after 4 pm will be answered the following business day.   You may also send us a message via MyChart. We typically respond to MyChart messages within 1-2 business days.  For prescription refills, please ask your pharmacy to contact our office. Our fax number is [phone].  If you have an urgent issue when the clinic is closed that cannot wait until the next business day, you can page your doctor at the number below.    Please note that while we do our best to be available for urgent issues outside of office hours, we are not available 24/7.   If you have an urgent issue and are unable to reach us, you may choose to seek medical care at your doctor's office, retail clinic, urgent care center, or emergency room.  If you have a medical emergency, please immediately call 911 or go to the emergency department.  Pager Numbers  - Dr. Kowalski: [phone]  - Dr. Moye: [phone]  - Dr. Stewart: [phone]  In the event of inclement weather, please call our main line at   [phone] for an update on the status of any delays or closures.  Dermatology Medication Tips: Please keep the boxes that topical medications come in in order to help keep track of the instructions about where and how to use these. Pharmacies typically print the medication instructions only on the boxes and not directly on the medication tubes.   If your medication is too expensive, please contact our office at [phone] option 4 or send us a message through MyChart.   We are unable to tell what your co-pay for medications will be in  advance as this is different depending on your insurance coverage. However, we may be able to find a substitute medication at lower cost or fill out paperwork to get insurance to cover a needed medication.   If a prior authorization is required to get your medication covered by your insurance company, please allow us 1-2 business days to complete this process.  Drug prices often vary depending on where the prescription is filled and some pharmacies may offer cheaper prices.  The website www.goodrx.com contains coupons for medications through different pharmacies. The prices here do not account for what the cost may be with help from insurance (it may be cheaper with your insurance), but the website can give you the price if you did not use any insurance.  - You can print the associated coupon and take it with your prescription to the pharmacy.  - You may also stop by our office during regular business hours and pick up a GoodRx coupon card.  - If you need your prescription sent electronically to a different pharmacy, notify our office through [facility] MyChart or by phone at [phone] option 4.     Si Usted Necesita Algo Despus de Su Visita  Tambin puede enviarnos un mensaje a travs de MyChart. Por lo general respondemos a los mensajes de MyChart en el transcurso de 1 a 2 das hbiles.  Para renovar recetas, por favor pida a su farmacia que se ponga en contacto con nuestra oficina. Nuestro nmero de fax es el [phone].  Si tiene un asunto urgente cuando la clnica est cerrada y que no puede esperar hasta el siguiente da hbil, puede llamar/localizar a su doctor(a) al nmero que aparece a continuacin.   Por favor, tenga en cuenta que aunque hacemos todo lo posible para estar disponibles para asuntos urgentes fuera del horario de oficina, no estamos disponibles las 24 horas del da, los 7 das de la semana.   Si tiene un problema urgente y no puede comunicarse con nosotros, puede  optar por buscar atencin mdica  en el consultorio de su doctor(a), en una clnica privada, en un centro de atencin urgente o en una sala de emergencias.  Si tiene una emergencia mdica, por favor llame inmediatamente al 911 o vaya a la sala de emergencias.  Nmeros de bper  - Dr. Kowalski: [phone]  - Dra. Moye: [phone]  - Dra. Stewart: [phone]  En caso de inclemencias del tiempo, por favor llame a nuestra lnea principal al [phone] para una actualizacin sobre el estado de cualquier retraso o cierre.  Consejos para la medicacin en dermatologa: Por favor, guarde las cajas en las que vienen los medicamentos de uso tpico para ayudarle a seguir las instrucciones sobre dnde y cmo usarlos. Las farmacias generalmente imprimen las instrucciones del medicamento slo en las cajas y no directamente en los tubos del medicamento.   Si su medicamento es muy caro, por favor, pngase en contacto con   nuestra oficina llamando al [phone] y presione la opcin 4 o envenos un mensaje a travs de MyChart.   No podemos decirle cul ser su copago por los medicamentos por adelantado ya que esto es diferente dependiendo de la cobertura de su seguro. Sin embargo, es posible que podamos encontrar un medicamento sustituto a menor costo o llenar un formulario para que el seguro cubra el medicamento que se considera necesario.   Si se requiere una autorizacin previa para que su compaa de seguros cubra su medicamento, por favor permtanos de 1 a 2 das hbiles para completar este proceso.  Los precios de los medicamentos varan con frecuencia dependiendo del lugar de dnde se surte la receta y alguna farmacias pueden ofrecer precios ms baratos.  El sitio web www.goodrx.com tiene cupones para medicamentos de diferentes farmacias. Los precios aqu no tienen en cuenta lo que podra costar con la ayuda del seguro (puede ser ms barato con su seguro), pero el sitio web puede darle el  precio si no utiliz ningn seguro.  - Puede imprimir el cupn correspondiente y llevarlo con su receta a la farmacia.  - Tambin puede pasar por nuestra oficina durante el horario de atencin regular y recoger una tarjeta de cupones de GoodRx.  - Si necesita que su receta se enve electrnicamente a una farmacia diferente, informe a nuestra oficina a travs de MyChart de [place] o por telfono llamando al [phone] y presione la opcin 4.  

## 2022-03-21 NOTE — Progress Notes (Signed)
Follow-Up Visit   Subjective  Tina Stephenson is a 67 y.o. female who presents for the following: open comedones with cysts (Back, pt presents for punch exc x 3). Also w hx dysplastic nevus - recheck today.  The following portions of the chart were reviewed this encounter and updated as appropriate:   Tobacco  Allergies  Meds  Problems  Med Hx  Surg Hx  Fam Hx     Review of Systems:  No other skin or systemic complaints except as noted in HPI or Assessment and Plan.  Objective  Well appearing patient in no apparent distress; mood and affect are within normal limits.  A focused examination was performed including back. Relevant physical exam findings are noted in the Assessment and Plan.  Right Upper Back Cystic pap with open comedone 0.5cm  L mid back Cystic pap with open comedone 0.5cm  R mid back Cystic pap with open comedone 0.5cm   Assessment & Plan   History of Dysplastic Nevi - No evidence of recurrence today - Recommend regular full body skin exams - Recommend daily broad spectrum sunscreen SPF 30+ to sun-exposed areas, reapply every 2 hours as needed.  - Call if any new or changing lesions are noted between office visits  - L medial mid scapula clear  Neoplasm of skin (3) Right Upper Back  Skin excision  Lesion length (cm):  0.5 Lesion width (cm):  0.5 Margin per side (cm):  0 Total excision diameter (cm):  0.5 Informed consent: discussed and consent obtained   Timeout: patient name, date of birth, surgical site, and procedure verified   Procedure prep:  Patient was prepped and draped in usual sterile fashion Prep type:  Isopropyl alcohol and povidone-iodine Anesthesia: the lesion was anesthetized in a standard fashion   Local anesthetic: 2cc lido w/epi, 1cc bupivicaine, Total of 3cc. Instrument used comment:  88mm punch Hemostasis achieved with: pressure   Hemostasis achieved with comment:  Electrocautery Outcome: patient tolerated procedure well  with no complications   Post-procedure details: sterile dressing applied and wound care instructions given   Dressing type: bandage, pressure dressing and bacitracin (Mupirocin)    Skin repair Complexity:  Complex Final length (cm):  0.8 Reason for type of repair: reduce tension to allow closure, reduce the risk of dehiscence, infection, and necrosis, reduce subcutaneous dead space and avoid a hematoma, allow closure of the large defect, preserve normal anatomy, preserve normal anatomical and functional relationships and enhance both functionality and cosmetic results   Undermining: area extensively undermined   Undermining comment:  Undermining Defect 0.5cm Subcutaneous layers (deep stitches):  Suture size:  4-0 Suture type: Vicryl (polyglactin 910)   Subcutaneous suture technique: Inverted Dermal. Fine/surface layer approximation (top stitches):  Suture size:  3-0 Suture type: nylon   Stitches: horizontal mattress   Suture removal (days):  7 Hemostasis achieved with: pressure Outcome: patient tolerated procedure well with no complications   Post-procedure details: sterile dressing applied and wound care instructions given   Dressing type: bandage, pressure dressing and bacitracin (Mupirocin)    L mid back  Skin excision  Lesion length (cm):  0.5 Lesion width (cm):  0.5 Margin per side (cm):  0 Total excision diameter (cm):  0.5 Informed consent: discussed and consent obtained   Timeout: patient name, date of birth, surgical site, and procedure verified   Procedure prep:  Patient was prepped and draped in usual sterile fashion Prep type:  Isopropyl alcohol and povidone-iodine Anesthesia: the lesion was anesthetized in  a standard fashion   Local anesthetic: 2cc lido w/epi, 1cc bupivicaine, Total of 3cc. Instrument used comment:  91mm punch Hemostasis achieved with: pressure   Hemostasis achieved with comment:  Electrocautery Outcome: patient tolerated procedure well with no  complications   Post-procedure details: sterile dressing applied and wound care instructions given   Dressing type: bandage, pressure dressing and bacitracin (Mupirocin)    Skin repair Complexity:  Complex Final length (cm):  0.8 Reason for type of repair: reduce tension to allow closure, reduce the risk of dehiscence, infection, and necrosis, reduce subcutaneous dead space and avoid a hematoma, allow closure of the large defect, preserve normal anatomy, preserve normal anatomical and functional relationships and enhance both functionality and cosmetic results   Undermining: area extensively undermined   Undermining comment:  Undermining Defect 0.5cm Subcutaneous layers (deep stitches):  Suture size:  4-0 Suture type: Vicryl (polyglactin 910)   Subcutaneous suture technique: Inverted Dermal. Fine/surface layer approximation (top stitches):  Suture size:  3-0 Suture type: nylon   Stitches: horizontal mattress   Suture removal (days):  7 Hemostasis achieved with: pressure Outcome: patient tolerated procedure well with no complications   Post-procedure details: sterile dressing applied and wound care instructions given   Dressing type: bandage, pressure dressing and bacitracin (Mupirocin)    R mid back  Skin excision  Lesion length (cm):  0.5 Lesion width (cm):  0.5 Margin per side (cm):  0 Total excision diameter (cm):  0.5 Informed consent: discussed and consent obtained   Timeout: patient name, date of birth, surgical site, and procedure verified   Procedure prep:  Patient was prepped and draped in usual sterile fashion Prep type:  Isopropyl alcohol and povidone-iodine Anesthesia: the lesion was anesthetized in a standard fashion   Local anesthetic: 2cc lido w/epi, 1cc bupivicaine, Total of 3cc. Instrument used comment:  21mm punch Hemostasis achieved with: pressure   Hemostasis achieved with comment:  Electrocautery Outcome: patient tolerated procedure well with no  complications   Post-procedure details: sterile dressing applied and wound care instructions given   Dressing type: bandage, pressure dressing and bacitracin (Mupirocin)    Skin repair Complexity:  Complex Final length (cm):  0.8 Reason for type of repair: reduce tension to allow closure, reduce the risk of dehiscence, infection, and necrosis, reduce subcutaneous dead space and avoid a hematoma, allow closure of the large defect, preserve normal anatomy, preserve normal anatomical and functional relationships and enhance both functionality and cosmetic results   Undermining: area extensively undermined   Undermining comment:  Undermining Defect 0.5cm Subcutaneous layers (deep stitches):  Suture size:  4-0 Suture type: Vicryl (polyglactin 910)   Subcutaneous suture technique: Inverted Dermal. Fine/surface layer approximation (top stitches):  Suture size:  3-0 Suture type: nylon   Stitches: horizontal mattress   Suture removal (days):  7 Hemostasis achieved with: pressure Outcome: patient tolerated procedure well with no complications   Post-procedure details: sterile dressing applied and wound care instructions given   Dressing type: bandage, pressure dressing and bacitracin (Mupirocin)    Related Procedures Anatomic Pathology Report   Return in about 1 week (around 03/28/2022) for suture removal, 4-45m TBSE, Hx of Dysplastic nevi.  I, Othelia Pulling, RMA, am acting as scribe for Sarina Ser, MD . Documentation: I have reviewed the above documentation for accuracy and completeness, and I agree with the above.  Sarina Ser, MD

## 2022-03-22 ENCOUNTER — Telehealth: Payer: Self-pay

## 2022-03-22 NOTE — Telephone Encounter (Signed)
Called back provider she states she prefers to talk to PCP directly. Quick summary is that Patient BS are running low and she would like to discuss with PCP patient may need med change. Please call her at  [phone]  Copied from [place] [id number]. Topic: General - Other >> Mar 22, 2022  9:11 AM Barbie Haggis wrote: Reason for CRM:  Dr Yevonne Pax Chiropractic from [facility] would like a call back regarding patient.

## 2022-03-23 ENCOUNTER — Encounter: Payer: Self-pay | Admitting: Family Medicine

## 2022-03-23 NOTE — Progress Notes (Signed)
Name: Tina Stephenson   MRN: [id number]    DOB: 06/16/1955   Date:03/23/2022       Progress Note  Subjective:    Chief Complaint  Chief Complaint  Patient presents with   Follow-up    Blood sugars and BP running normal per patient    I connected with  [name] on 03/23/22 at  8:20 AM EDT by telephone and verified that I am speaking with the correct person using two identifiers.   I discussed the limitations, risks, security and privacy concerns of performing an evaluation and management service by telephone and the availability of in person appointments. Staff also discussed with the patient that there may be a patient responsible charge related to this service.  Patient verbalized understanding and agreed to proceed with encounter. Patient Location: home Provider Location: home office Additional Individuals present: none  HPI Pt requested we review blood sugar and BP readings which have been sent in through mychart msgs   BP Readings from Last 6 Encounters:  03/24/22 106/64  03/21/22 122/65  11/07/21 122/68  10/31/21 (!) 144/79  09/10/21 (!) 90/49  05/24/21 124/86   Average on BP machine - 114/67               Pt has hx of well controlled DM only on metformin 1000 mg BID HTN well controlled as well on HCTZ only Lab Results  Component Value Date   HGBA1C 5.6 08/12/2021   BP Readings from Last 3 Encounters:  03/21/22 122/65  11/07/21 122/68  10/31/21 (!) 144/79   She has lost weight more than 23 lbs Working with Dr.  Young Berry Wt Readings from Last 5 Encounters:  11/07/21 219 lb 12.8 oz (99.7 kg)  05/24/21 227 lb (103 kg)  05/06/21 230 lb (104.3 kg)  02/16/21 243 lb 12.8 oz (110.6 kg)  02/15/21 242 lb 15.2 oz (110.2 kg)   BMI Readings from Last 5 Encounters:  11/07/21 38.94 kg/m  05/24/21 40.21 kg/m  05/06/21 40.74 kg/m  02/16/21 43.19 kg/m  02/15/21 43.04 kg/m      Patient Active Problem List   Diagnosis Date Noted   Low back pain  11/07/2021   Class 2 obesity with body mass index (BMI) of 38.0 to 38.9 in adult 11/07/2021   Leg swelling 08/02/2020   Dyslipidemia 01/05/2020   Type 2 diabetes mellitus without complication, without long-term current use of insulin ([place]) 08/22/2018   Scoliosis 03/25/2018   Onychomycosis 02/21/2018   Vitamin D deficiency 03/19/2015   Essential hypertension 03/05/2015    Social History   Tobacco Use   Smoking status: Never   Smokeless tobacco: Never  Substance Use Topics   Alcohol use: Never     Current Outpatient Medications:    acetaminophen (TYLENOL) 500 MG tablet, Take 1,000 mg by mouth 2 (two) times daily., Disp: , Rfl:    acetaminophen-codeine (TYLENOL #3) 300-30 MG tablet, Take 1-2 tablets by mouth daily as needed for moderate pain., Disp: 60 tablet, Rfl: 2   albuterol (PROVENTIL) (2.5 MG/3ML) 0.083% nebulizer solution, Take 3 mLs (2.5 mg total) by nebulization every 6 (six) hours as needed for wheezing or shortness of breath., Disp: 150 mL, Rfl: 3   blood glucose meter kit and supplies KIT, Dispense based on patient and insurance preference. Use once daily as directed. (FOR ICD-10 E11.9)., Disp: 1 each, Rfl: 0   glucose blood test strip, Use as instructed, Disp: 100 each, Rfl: 1   hydrochlorothiazide (HYDRODIURIL) 12.5 MG tablet, TAKE  1 TABLET(12.5 MG) BY MOUTH DAILY, Disp: 90 tablet, Rfl: 3   Lancets (ONETOUCH DELICA PLUS 123XX123) MISC, USE TO CHECK BLOOD SUGAR TWICE DAILY, Disp: 100 each, Rfl: 5   metFORMIN (GLUCOPHAGE) 500 MG tablet, TAKE 2 TABLETS(1000 MG) BY MOUTH TWICE DAILY WITH A MEAL, Disp: 360 tablet, Rfl: 3   Multiple Vitamins-Minerals (MULTIVITAMIN WITH MINERALS) tablet, Take 1 tablet by mouth daily., Disp: , Rfl:    rosuvastatin (CRESTOR) 5 MG tablet, Take 1 tablet (5 mg total) by mouth daily., Disp: 90 tablet, Rfl: 3   SUMAtriptan (IMITREX) 25 MG tablet, Take 25-50 mg po at migraine onset, and may repeat dose in 2 hours as needed for unresolved HA, Maximum  daily dose 200 mg in 24 hours, Disp: 20 tablet, Rfl: 3  Allergies  Allergen Reactions   Penicillins Other (See Comments)    "lose my hearing"    Chart Review: I personally reviewed active problem list, medication list, allergies, family history, social history, health maintenance, notes from last encounter, lab results, imaging with the patient/caregiver today.   Review of Systems  Constitutional: Negative.   HENT: Negative.    Eyes: Negative.   Respiratory: Negative.    Cardiovascular: Negative.   Gastrointestinal: Negative.   Endocrine: Negative.   Genitourinary: Negative.   Musculoskeletal: Negative.   Skin: Negative.   Allergic/Immunologic: Negative.   Neurological: Negative.   Hematological: Negative.   Psychiatric/Behavioral: Negative.    All other systems reviewed and are negative.    Objective:    Virtual encounter, vitals limited, only able to obtain the following There were no vitals filed for this visit. There is no height or weight on file to calculate BMI. Nursing Note and Vital Signs reviewed.  Physical Exam Vitals and nursing note reviewed.  Neurological:     Mental Status: She is alert.  Psychiatric:        Mood and Affect: Mood normal.     PE limited by telephone encounter  No results found for this or any previous visit (from the past 72 hour(s)).  Assessment and Plan:     ICD-10-CM   1. Essential hypertension  I10 Comprehensive metabolic panel   BP readings all at goal, but she has felt lightheaded, will cut her HCTZ in 1/2 and monitor BPs at home and she has f/up in office in april    2. Type 2 diabetes mellitus without complication, without long-term current use of insulin ([facility]CC)  E11.9 Comprehensive metabolic panel    Hemoglobin A1c   has been well controlled, loosing weight and working on diet, some low sugars (see below) A1C and office f/up in april    3. Encounter for medication monitoring  Z51.81 Comprehensive metabolic panel     Hemoglobin A1c    4. Hypoglycemia  E16.2 Comprehensive metabolic panel   few episodes of low blood sugar, reduce metformin dose to 500 mg BID and check CBGs when symptomatic    5. Right foot pain  M79.671 Ambulatory referral to Podiatry   right dorsal foot pain onset x 3 weeks        I provided 25 minutes of non-face-to-face time during this encounter.  Delsa Grana, PA-C 03/23/22 10:35 PM

## 2022-03-23 NOTE — Telephone Encounter (Signed)
Lvm for pt to call back and schedule a virtual appt per leisa

## 2022-03-24 ENCOUNTER — Telehealth (INDEPENDENT_AMBULATORY_CARE_PROVIDER_SITE_OTHER): Payer: Managed Care, Other (non HMO) | Admitting: Family Medicine

## 2022-03-24 ENCOUNTER — Encounter: Payer: Self-pay | Admitting: Family Medicine

## 2022-03-24 ENCOUNTER — Encounter: Payer: Self-pay | Admitting: Dermatology

## 2022-03-24 VITALS — BP 106/64 | HR 61

## 2022-03-24 DIAGNOSIS — Z7984 Long term (current) use of oral hypoglycemic drugs: Secondary | ICD-10-CM | POA: Diagnosis not present

## 2022-03-24 DIAGNOSIS — M79671 Pain in right foot: Secondary | ICD-10-CM

## 2022-03-24 DIAGNOSIS — E11649 Type 2 diabetes mellitus with hypoglycemia without coma: Secondary | ICD-10-CM

## 2022-03-24 DIAGNOSIS — Z5181 Encounter for therapeutic drug level monitoring: Secondary | ICD-10-CM

## 2022-03-24 DIAGNOSIS — I10 Essential (primary) hypertension: Secondary | ICD-10-CM | POA: Diagnosis not present

## 2022-03-24 DIAGNOSIS — E162 Hypoglycemia, unspecified: Secondary | ICD-10-CM

## 2022-03-24 DIAGNOSIS — E119 Type 2 diabetes mellitus without complications: Secondary | ICD-10-CM

## 2022-03-24 NOTE — Patient Instructions (Signed)
Preventing Hypoglycemia Hypoglycemia occurs when the level of sugar (glucose) in the blood is too low. Hypoglycemia can happen in people who do or do not have diabetes (diabetes mellitus). It can develop quickly, and it can be a medical emergency. For most people with diabetes, a blood glucose level below 70 mg/dL (3.9 mmol/L) is considered hypoglycemia. Glucose is a type of sugar that provides the body's main source of energy. Certain hormones (insulin and glucagon) control the level of glucose in the blood. Insulin lowers blood glucose, and glucagon increases blood glucose. Hypoglycemia can result from having too much insulin in the bloodstream, or from not eating enough food that contains glucose. Your risk for hypoglycemia is higher: If you take insulin or diabetes medicines to help lower your blood glucose or to help your body make more insulin. If you skip or delay a meal or snack. If you are ill. During and after exercise. You can prevent hypoglycemia by working with your health care provider to adjust your meal plan as needed and by taking other precautions. How can hypoglycemia affect me? Mild symptoms Mild hypoglycemia may not cause any symptoms. If you do have symptoms, they may include: Hunger. Sweating and feeling clammy. Dizziness or feeling light-headed. Sleepiness or restless sleep. Nausea. Increased heart rate. Headache. Blurry vision. Mood changes, including irritability or anxiety. Tingling or numbness around the mouth, lips, or tongue. If mild hypoglycemia is not recognized and treated, it can quickly become moderate or severe hypoglycemia. Moderate symptoms Moderate hypoglycemia can cause: Confusion and poor judgment. Behavior changes. Weakness. Irregular heartbeat. A change in coordination. Severe symptoms Severe hypoglycemia is a medical emergency. It can cause: Fainting. Seizures. Loss of consciousness (coma). Death. What nutrition changes can be  made? Work with your health care provider or dietitian to make a healthy meal plan that is right for you. Follow your meal plan carefully. Eat meals at regular times. If recommended by your health care provider, have snacks between meals. Donot skip or delay meals or snacks. You can be at risk for hypoglycemia if you are not getting enough carbohydrates. What lifestyle changes can be made?  Work closely with your health care provider to manage your blood glucose. Make sure you know: Your goal blood glucose levels. How and when to check your blood glucose. The symptoms of hypoglycemia. It is important to treat hypoglycemia right away to keep it from becoming severe. Do not drink alcohol on an empty stomach. When you are ill, check your blood glucose more often than usual. Make a sick day plan in advance with your health care provider. Follow this plan whenever you cannot eat or drink normally. Always check your blood glucose before, during, and after exercise. How is this treated? This condition can often be treated by immediately eating or drinking something that contains sugar with 15 grams of fast-acting carbohydrate, such as: 4 oz (120 mL) of fruit juice. 4 oz (120 mL) of regular soda (not diet soda). Several pieces of hard candy. Check food labels to find out how many pieces to eat for 15 grams. 1 Tbsp (15 mL) of sugar or honey. 4 glucose tablets. 1 tube of glucose gel. Treating hypoglycemia if you have diabetes If you are alert and able to swallow safely, follow the 15:15 rule: Take 15 grams of a fast-acting carbohydrate. Talk with your health care provider about how much you should take. Fast-acting options include: Glucose tablets (take 4 tablets). Several pieces of hard candy. Check food labels to find   out how many pieces to eat for 15 grams. 4 oz (120 mL) of fruit juice. 4 oz (120 mL) of regular soda (not diet soda). 1 Tbsp (15 mL) of sugar or honey. 1 tube of glucose  gel. Check your blood glucose 15 minutes after you take the carbohydrate. If the repeat blood glucose level is still at or below 70 mg/dL (3.9 mmol/L), take 15 grams of a carbohydrate again. If your blood glucose level does not increase above 70 mg/dL (3.9 mmol/L) after 3 tries, seek emergency medical care. After your blood glucose level returns to normal, eat a meal or a snack within 1 hour. Treating severe hypoglycemia Severe hypoglycemia is when your blood glucose level is below 54 mg/dL (3 mmol/L). Severe hypoglycemia is a medical emergency. Get medical help right away. If you have severe hypoglycemia and you cannot eat or drink, you may need glucagon. A family member or close friend should learn how to check your blood glucose and how to give you glucagon. Ask your health care provider if you need to have an emergency glucagon kit available. Severe hypoglycemia may need to be treated in a hospital. The treatment may include getting glucose through an IV. You may also need treatment for the cause of your hypoglycemia. Where to find more information American Diabetes Association: www.diabetes.org National Institute of Diabetes and Digestive and Kidney Diseases: www.niddk.nih.gov Association of Diabetes Care & Education Specialists: www.diabeteseducator.org Contact a health care provider if: You have problems keeping your blood glucose in your target range. You have frequent episodes of hypoglycemia. Get help right away if: You continue to have hypoglycemia symptoms after eating or drinking something containing glucose. Your blood glucose level is below 54 mg/dL (3 mmol/L). You faint. You have a seizure. These symptoms may represent a serious problem that is an emergency. Do not wait to see if the symptoms will go away. Get medical help right away. Call your local emergency services (911 in the [place] Do not drive yourself to the hospital. Summary Know the symptoms of hypoglycemia and when  you are at risk for it, such as during exercise or when you are sick. Check your blood glucose often when you are at risk for hypoglycemia. Hypoglycemia can develop quickly, and it can be dangerous if it is not treated right away. If you have a history of severe hypoglycemia, make sure your family or a close friend knows how to use your glucagon kit. Make sure you know how to treat hypoglycemia. Keep a fast-acting carbohydrate option available when you may be at risk for hypoglycemia. This information is not intended to replace advice given to you by your health care provider. Make sure you discuss any questions you have with your health care provider. Document Revised: 11/27/2019 Document Reviewed: 11/27/2019 Elsevier Patient Education  2023 Elsevier Inc.  

## 2022-03-27 LAB — ANATOMIC PATHOLOGY REPORT

## 2022-03-28 ENCOUNTER — Ambulatory Visit (INDEPENDENT_AMBULATORY_CARE_PROVIDER_SITE_OTHER): Payer: Managed Care, Other (non HMO)

## 2022-03-28 DIAGNOSIS — Z4802 Encounter for removal of sutures: Secondary | ICD-10-CM

## 2022-03-29 ENCOUNTER — Telehealth: Payer: Self-pay

## 2022-03-29 NOTE — Progress Notes (Signed)
Encounter for Removal of Sutures - Incision site at the right upper back, left mid back, right mid back is clean, dry and intact - Wound cleansed, sutures removed, wound cleansed and steri strips applied.  - Patient advised to keep steri-strips dry until they fall off. - Scars remodel for a full year. - Once steri-strips fall off, patient can apply over-the-counter silicone scar cream each night to help with scar remodeling if desired. - Patient advised to call with any concerns or if they notice any new or changing lesions.  Johnsie Kindred, RMA

## 2022-03-29 NOTE — Telephone Encounter (Signed)
-----   Message from Brendolyn Patty, MD sent at 03/29/2022 10:51 AM EDT -----  1-Skin Excision, Punch, Right Upper Back:  FOLLICULAR CYST, INFUNDIBULAR TYPE (EPIDERMOID CYST).  Specimen 2-Skin Excision, Punch, Left Mid Back: FOLLICULAR  CYST, INFUNDIBULAR TYPE (EPIDERMOID CYST).  Specimen 3-Skin Excision, Punch, Right Mid Back: FOLLICULAR  CYST, INFUNDIBULAR TYPE (EPIDERMOID CYST).   All benign cysts - please call patient

## 2022-03-29 NOTE — Telephone Encounter (Signed)
Patient informed of pathology results 

## 2022-04-18 ENCOUNTER — Encounter: Payer: Self-pay | Admitting: Podiatry

## 2022-04-18 ENCOUNTER — Ambulatory Visit: Payer: Managed Care, Other (non HMO) | Admitting: Podiatry

## 2022-04-18 ENCOUNTER — Ambulatory Visit (INDEPENDENT_AMBULATORY_CARE_PROVIDER_SITE_OTHER): Payer: Managed Care, Other (non HMO)

## 2022-04-18 DIAGNOSIS — M722 Plantar fascial fibromatosis: Secondary | ICD-10-CM | POA: Diagnosis not present

## 2022-04-18 DIAGNOSIS — M7751 Other enthesopathy of right foot: Secondary | ICD-10-CM | POA: Diagnosis not present

## 2022-04-18 DIAGNOSIS — M779 Enthesopathy, unspecified: Secondary | ICD-10-CM

## 2022-04-18 MED ORDER — METHYLPREDNISOLONE 4 MG PO TBPK: ORAL_TABLET | ORAL | 0 refills | Status: DC

## 2022-04-18 MED ORDER — MELOXICAM 15 MG PO TABS
15.0000 mg | ORAL_TABLET | Freq: Every day | ORAL | 1 refills | Status: DC
Start: 2022-04-18 — End: 2022-06-14
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
## 2022-04-20 ENCOUNTER — Encounter: Payer: Self-pay | Admitting: Family Medicine

## 2022-05-02 LAB — COMPREHENSIVE METABOLIC PANEL
ALT: 32 IU/L (ref 0–32)
AST: 30 IU/L (ref 0–40)
Albumin/Globulin Ratio: 1.9 (ref 1.2–2.2)
Albumin: 4 g/dL (ref 3.9–4.9)
Alkaline Phosphatase: 84 IU/L (ref 44–121)
BUN/Creatinine Ratio: 14 (ref 12–28)
BUN: 11 mg/dL (ref 8–27)
Bilirubin Total: 0.9 mg/dL (ref 0.0–1.2)
CO2: 24 mmol/L (ref 20–29)
Calcium: 9.3 mg/dL (ref 8.7–10.3)
Chloride: 107 mmol/L — ABNORMAL HIGH (ref 96–106)
Creatinine, Ser: 0.76 mg/dL (ref 0.57–1.00)
Globulin, Total: 2.1 g/dL (ref 1.5–4.5)
Glucose: 86 mg/dL (ref 70–99)
Potassium: 4.2 mmol/L (ref 3.5–5.2)
Sodium: 144 mmol/L (ref 134–144)
Total Protein: 6.1 g/dL (ref 6.0–8.5)
eGFR: 87 mL/min/{1.73_m2} (ref >59)

## 2022-05-02 LAB — HEMOGLOBIN A1C
Est. average glucose Bld gHb Est-mCnc: 117 mg/dL
Hgb A1c MFr Bld: 5.7 % — ABNORMAL HIGH (ref 4.8–5.6)

## 2022-05-09 ENCOUNTER — Encounter: Payer: Self-pay | Admitting: Family Medicine

## 2022-05-09 ENCOUNTER — Ambulatory Visit (INDEPENDENT_AMBULATORY_CARE_PROVIDER_SITE_OTHER): Payer: Managed Care, Other (non HMO) | Admitting: Family Medicine

## 2022-05-09 ENCOUNTER — Ambulatory Visit
Admission: RE | Admit: 2022-05-09 | Discharge: 2022-05-09 | Disposition: A | Discharge status: Home / Self Care | Payer: Managed Care, Other (non HMO) | Source: Ambulatory Visit | Attending: Family Medicine | Admitting: Family Medicine

## 2022-05-09 VITALS — BP 126/68 | HR 75 | Temp 98.1°F | Resp 16 | Ht 62.0 in | Wt 213.3 lb

## 2022-05-09 DIAGNOSIS — R519 Headache, unspecified: Secondary | ICD-10-CM | POA: Diagnosis present

## 2022-05-09 MED ORDER — SUMATRIPTAN SUCCINATE 100 MG PO TABS: ORAL_TABLET | ORAL | 1 refills | Status: DC

## 2022-05-09 MED ORDER — DEXAMETHASONE SODIUM PHOSPHATE 10 MG/ML IJ SOLN
10.0000 mg | Freq: Once | INTRAMUSCULAR | Status: AC
Start: 2022-05-09 — End: 2022-05-09
  Administered 2022-05-09: 10 mg via INTRAMUSCULAR

## 2022-05-09 MED ORDER — ONDANSETRON HCL 4 MG PO TABS: 4.0000 mg | ORAL_TABLET | Freq: Three times a day (TID) | ORAL | 0 refills | Status: AC | PRN

## 2022-05-09 MED ORDER — DOXYCYCLINE HYCLATE 100 MG PO TABS: 100.0000 mg | ORAL_TABLET | Freq: Two times a day (BID) | ORAL | 0 refills | Status: AC

## 2022-05-09 NOTE — Progress Notes (Signed)
Patient ID: Tina Stephenson, female    DOB: 04-29-1955, 67 y.o.   MRN: [id number]  PCP: Danelle Berry, PA-C  Chief Complaint  Patient presents with   Headache    Since 04/29/22 persistent headaches pt states feels like migraines.    Subjective:   Tina Stephenson is a 67 y.o. female, presents to clinic with CC of the following:  Pt with hx of migraines many years ago, she had a HA gradually develop on 4/20 while at work, pretty severe very quickly, felt like a band around head/forehead  Headache  This is a new problem. Episode onset: 10 days. The pain is located in the Frontal, parietal and bilateral region. Radiates to: back of head sometimes. The quality of the pain is described as band-like (contant pain and pressure, sometimes sharp). Associated symptoms include dizziness, drainage, a loss of balance, nausea, photophobia and sinus pressure. Pertinent negatives include no abdominal pain, abnormal behavior, anorexia, back pain, blurred vision, coughing, ear pain, eye pain, eye redness, eye watering, facial sweating, fever, hearing loss, insomnia, muscle aches, neck pain, numbness, phonophobia, rhinorrhea, scalp tenderness, seizures, sore throat, swollen glands, tingling, tinnitus, visual change, vomiting, weakness or weight loss. Associated symptoms comments: Visual disturbances "flashing of light". The symptoms are aggravated by bright light. She has tried acetaminophen and darkened room for the symptoms.    Drove herself here     Patient Active Problem List   Diagnosis Date Noted   Low back pain 11/07/2021   Class 2 obesity with body mass index (BMI) of 38.0 to 38.9 in adult 11/07/2021   Leg swelling 08/02/2020   Dyslipidemia 01/05/2020   Type 2 diabetes mellitus without complication, without long-term current use of insulin (HCC) 08/22/2018   Scoliosis 03/25/2018   Onychomycosis 02/21/2018   Vitamin D deficiency 03/19/2015   Essential hypertension 03/05/2015      Current  Outpatient Medications:    acetaminophen (TYLENOL) 500 MG tablet, Take 1,000 mg by mouth 2 (two) times daily., Disp: , Rfl:    acetaminophen-codeine (TYLENOL #3) 300-30 MG tablet, Take 1-2 tablets by mouth daily as needed for moderate pain., Disp: 60 tablet, Rfl: 2   albuterol (PROVENTIL) (2.5 MG/3ML) 0.083% nebulizer solution, Take 3 mLs (2.5 mg total) by nebulization every 6 (six) hours as needed for wheezing or shortness of breath., Disp: 150 mL, Rfl: 3   blood glucose meter kit and supplies KIT, Dispense based on patient and insurance preference. Use once daily as directed. (FOR ICD-10 E11.9)., Disp: 1 each, Rfl: 0   glucose blood test strip, Use as instructed, Disp: 100 each, Rfl: 1   hydrochlorothiazide (HYDRODIURIL) 12.5 MG tablet, TAKE 1 TABLET(12.5 MG) BY MOUTH DAILY (Patient taking differently: Take 6.25 mg by mouth daily. TAKE 1 TABLET(12.5 MG) BY MOUTH DAILY), Disp: 90 tablet, Rfl: 3   Ibuprofen (ADVIL PO), Take by mouth. 1/2 pill, Disp: , Rfl:    Lancets (ONETOUCH DELICA PLUS LANCET33G) MISC, USE TO CHECK BLOOD SUGAR TWICE DAILY, Disp: 100 each, Rfl: 5   meloxicam (MOBIC) 15 MG tablet, Take 1 tablet (15 mg total) by mouth daily., Disp: 30 tablet, Rfl: 1   metFORMIN (GLUCOPHAGE) 500 MG tablet, TAKE 2 TABLETS(1000 MG) BY MOUTH TWICE DAILY WITH A MEAL, Disp: 360 tablet, Rfl: 3   Multiple Vitamins-Minerals (MULTIVITAMIN WITH MINERALS) tablet, Take 1 tablet by mouth daily., Disp: , Rfl:    rosuvastatin (CRESTOR) 5 MG tablet, Take 1 tablet (5 mg total) by mouth daily., Disp: 90 tablet, Rfl: 3  SUMAtriptan (IMITREX) 25 MG tablet, Take 25-50 mg po at migraine onset, and may repeat dose in 2 hours as needed for unresolved HA, Maximum daily dose 200 mg in 24 hours, Disp: 20 tablet, Rfl: 3   methylPREDNISolone (MEDROL DOSEPAK) 4 MG TBPK tablet, 6 day dose pack - take as directed (Patient not taking: Reported on 05/09/2022), Disp: 21 tablet, Rfl: 0   Allergies  Allergen Reactions   Penicillins  Other (See Comments)    "lose my hearing"     Social History   Tobacco Use   Smoking status: Never   Smokeless tobacco: Never  Vaping Use   Vaping Use: Never used  Substance Use Topics   Alcohol use: Never   Drug use: Never      Chart Review Today: I personally reviewed active problem list, medication list, allergies, family history, social history, health maintenance, notes from last encounter, lab results, imaging with the patient/caregiver today.   Review of Systems  Constitutional:  Negative for fever and weight loss.  HENT:  Positive for sinus pressure. Negative for ear pain, hearing loss, rhinorrhea, sore throat and tinnitus.   Eyes:  Positive for photophobia. Negative for blurred vision, pain and redness.  Respiratory:  Negative for cough.   Gastrointestinal:  Positive for nausea. Negative for abdominal pain, anorexia and vomiting.  Musculoskeletal:  Negative for back pain and neck pain.  Neurological:  Positive for dizziness, headaches and loss of balance. Negative for tingling, seizures, weakness and numbness.  Psychiatric/Behavioral:  The patient does not have insomnia.        Objective:   Vitals:   05/09/22 1357  BP: 126/68  Pulse: 75  Resp: 16  Temp: 98.1 F (36.7 C)  TempSrc: Oral  SpO2: 98%  Weight: 213 lb 4.8 oz (96.8 kg)  Height: 5\' 2"  (1.575 m)    Body mass index is 39.01 kg/m.  Physical Exam Vitals and nursing note reviewed.  Constitutional:      General: She is not in acute distress.    Appearance: Normal appearance. She is well-developed. She is obese. She is not ill-appearing, toxic-appearing or diaphoretic.     Interventions: Face mask in place.     Comments: Appears uncomfortable, but non-toxic  HENT:     Head: Normocephalic and atraumatic.     Right Ear: Tympanic membrane, ear canal and external ear normal. There is no impacted cerumen.     Left Ear: Ear canal and external ear normal. There is no impacted cerumen.     Nose:  Congestion and rhinorrhea present.     Mouth/Throat:     Mouth: Mucous membranes are moist.     Pharynx: Oropharynx is clear. No oropharyngeal exudate.  Eyes:     General: Lids are normal. No scleral icterus.       Right eye: No discharge.        Left eye: No discharge.     Conjunctiva/sclera: Conjunctivae normal.     Pupils: Pupils are equal, round, and reactive to light.  Neck:     Trachea: Phonation normal. No tracheal deviation.  Cardiovascular:     Rate and Rhythm: Normal rate and regular rhythm.     Pulses: Normal pulses.          Radial pulses are 2+ on the right side and 2+ on the left side.       Posterior tibial pulses are 2+ on the right side and 2+ on the left side.     Heart sounds:  Normal heart sounds. No murmur heard.    No friction rub. No gallop.  Pulmonary:     Effort: Pulmonary effort is normal. No respiratory distress.     Breath sounds: Normal breath sounds. No stridor. No wheezing, rhonchi or rales.  Chest:     Chest wall: No tenderness.  Abdominal:     General: Bowel sounds are normal. There is no distension.     Palpations: Abdomen is soft.  Musculoskeletal:     Right lower leg: No edema.     Left lower leg: No edema.  Skin:    General: Skin is warm and dry.     Coloration: Skin is not jaundiced or pale.     Findings: No rash.  Neurological:     Mental Status: She is alert.     Motor: No abnormal muscle tone.     Gait: Gait normal.     Comments: MENTAL STATUS: AAOx3, memory intact, fund of knowledge appropriate  LANG/SPEECH: Naming and repetition intact, fluent, no dysarthria, follows 3-step commands, answers questions appropriately  No data recorded   CRANIAL NERVES:   II: Pupils equal and reactive, no RAPD   III, IV, VI: EOM intact, no gaze preference or deviation, no nystagmus.   V: normal sensation in V1, V2, and V3 segments bilaterally   VII: no asymmetry, no nasolabial fold flattening   VIII: normal hearing to speech   IX, X: normal  palatal elevation, no uvular deviation   XI: 5/5 head turn and 5/5 shoulder shrug bilaterally   XII: midline tongue protrusion  MOTOR:  5/5 bilateral grip strength 5/5 strength dorsiflexion/plantarflexion b/l  SENSORY:  Normal to light touch Romberg absent  COORD: Normal finger to nose and heel to shin, no tremor, no dysmetria  STATION: normal stance, no truncal ataxia  GAIT: Normal gait Unable to walk heel-toe-heel states chronic     Psychiatric:        Mood and Affect: Mood normal.        Speech: Speech normal.        Behavior: Behavior normal.      Results for orders placed or performed in visit on 03/24/22  Comprehensive metabolic panel  Result Value Ref Range   Glucose 86 70 - 99 mg/dL   BUN 11 8 - 27 mg/dL   Creatinine, Ser 1.61 0.57 - 1.00 mg/dL   eGFR 87 >09 UE/AVW/0.98   BUN/Creatinine Ratio 14 12 - 28   Sodium 144 134 - 144 mmol/L   Potassium 4.2 3.5 - 5.2 mmol/L   Chloride 107 (H) 96 - 106 mmol/L   CO2 24 20 - 29 mmol/L   Calcium 9.3 8.7 - 10.3 mg/dL   Total Protein 6.1 6.0 - 8.5 g/dL   Albumin 4.0 3.9 - 4.9 g/dL   Globulin, Total 2.1 1.5 - 4.5 g/dL   Albumin/Globulin Ratio 1.9 1.2 - 2.2   Bilirubin Total 0.9 0.0 - 1.2 mg/dL   Alkaline Phosphatase 84 44 - 121 IU/L   AST 30 0 - 40 IU/L   ALT 32 0 - 32 IU/L  Hemoglobin A1c  Result Value Ref Range   Hgb A1c MFr Bld 5.7 (H) 4.8 - 5.6 %   Est. average glucose Bld gHb Est-mCnc 117 mg/dL       Assessment & Plan:     ICD-10-CM   1. Acute intractable headache, unspecified headache type  R51.9 CT HEAD WO CONTRAST ( )     Acute severe ha onset 10 d ago,  still severe with associated N, dizziness, mood/emotional changes, photosensitivity, flashing light/visual disturbances, hx of migraines as a teen and she hasn't had any for many years in the past she would have one to two HA a month but this HA is unlike prior migraines No recent trauma  Some sinus pressure/congestion but on exam no sinus ttp   Non-focal neuro eval Concern for intracranial pathology with severity of sx, visual disturbances, dizziness, severe nausea and unlike past headaches  Will try to get stat CT discussed going to ED for eval      Danelle Berry, PA-C 05/09/22 2:06 PM

## 2022-05-10 ENCOUNTER — Encounter: Payer: Self-pay | Admitting: Family Medicine

## 2022-05-10 ENCOUNTER — Ambulatory Visit: Payer: Managed Care, Other (non HMO) | Admitting: Family Medicine

## 2022-05-10 VITALS — BP 102/64 | HR 93 | Temp 97.8°F | Resp 16 | Ht 62.0 in | Wt 213.3 lb

## 2022-05-10 DIAGNOSIS — R051 Acute cough: Secondary | ICD-10-CM

## 2022-05-10 DIAGNOSIS — J322 Chronic ethmoidal sinusitis: Secondary | ICD-10-CM | POA: Diagnosis not present

## 2022-05-10 DIAGNOSIS — R519 Headache, unspecified: Secondary | ICD-10-CM

## 2022-05-10 MED ORDER — KETOROLAC TROMETHAMINE 60 MG/2ML IM SOLN
60.0000 mg | Freq: Once | INTRAMUSCULAR | Status: AC
Start: 2022-05-10 — End: 2022-05-10
  Administered 2022-05-10: 30 mg via INTRAMUSCULAR

## 2022-05-10 MED ORDER — PROMETHAZINE HCL 25 MG/ML IJ SOLN
25.0000 mg | Freq: Once | INTRAMUSCULAR | Status: AC
Start: 2022-05-10 — End: 2022-05-10
  Administered 2022-05-10: 25 mg via INTRAMUSCULAR

## 2022-05-10 MED ORDER — PROMETHAZINE HCL 25 MG PO TABS
25.0000 mg | ORAL_TABLET | Freq: Once | ORAL | Status: DC
Start: 2022-05-10 — End: 2022-05-10

## 2022-05-10 NOTE — Progress Notes (Unsigned)
Patient ID: Tina Stephenson, female    DOB: August 11, 1955, 67 y.o.   MRN: [id number]  PCP: Danelle Berry, PA-C  Chief Complaint  Patient presents with   Injections    per provider- she's coming back for a toradol shot 30 mg and phenergan shot 25 mg     Subjective:   Tina Stephenson is a 67 y.o. female, presents to clinic with CC of the following:  HPI  Coming in for IM meds for HA x 11 d CT head was negative yesterday  She got dexamethasone yesterday but could not get phenergan shot Herer for tordal and phenergan IM injections Outpt management of HA is to use   Patient Active Problem List   Diagnosis Date Noted   Low back pain 11/07/2021   Class 2 obesity with body mass index (BMI) of 38.0 to 38.9 in adult 11/07/2021   Leg swelling 08/02/2020   Dyslipidemia 01/05/2020   Type 2 diabetes mellitus without complication, without long-term current use of insulin (HCC) 08/22/2018   Scoliosis 03/25/2018   Onychomycosis 02/21/2018   Vitamin D deficiency 03/19/2015   Essential hypertension 03/05/2015      Current Outpatient Medications:    acetaminophen (TYLENOL) 500 MG tablet, Take 1,000 mg by mouth 2 (two) times daily., Disp: , Rfl:    acetaminophen-codeine (TYLENOL #3) 300-30 MG tablet, Take 1-2 tablets by mouth daily as needed for moderate pain., Disp: 60 tablet, Rfl: 2   albuterol (PROVENTIL) (2.5 MG/3ML) 0.083% nebulizer solution, Take 3 mLs (2.5 mg total) by nebulization every 6 (six) hours as needed for wheezing or shortness of breath., Disp: 150 mL, Rfl: 3   blood glucose meter kit and supplies KIT, Dispense based on patient and insurance preference. Use once daily as directed. (FOR ICD-10 E11.9)., Disp: 1 each, Rfl: 0   doxycycline (VIBRA-TABS) 100 MG tablet, Take 1 tablet (100 mg total) by mouth 2 (two) times daily for 7 days., Disp: 14 tablet, Rfl: 0   glucose blood test strip, Use as instructed, Disp: 100 each, Rfl: 1   hydrochlorothiazide (HYDRODIURIL) 12.5 MG tablet, TAKE 1  TABLET(12.5 MG) BY MOUTH DAILY (Patient taking differently: Take 6.25 mg by mouth daily. TAKE 1 TABLET(12.5 MG) BY MOUTH DAILY), Disp: 90 tablet, Rfl: 3   Ibuprofen (ADVIL PO), Take by mouth. 1/2 pill, Disp: , Rfl:    Lancets (ONETOUCH DELICA PLUS LANCET33G) MISC, USE TO CHECK BLOOD SUGAR TWICE DAILY, Disp: 100 each, Rfl: 5   meloxicam (MOBIC) 15 MG tablet, Take 1 tablet (15 mg total) by mouth daily., Disp: 30 tablet, Rfl: 1   metFORMIN (GLUCOPHAGE) 500 MG tablet, TAKE 2 TABLETS(1000 MG) BY MOUTH TWICE DAILY WITH A MEAL, Disp: 360 tablet, Rfl: 3   methylPREDNISolone (MEDROL DOSEPAK) 4 MG TBPK tablet, 6 day dose pack - take as directed (Patient not taking: Reported on 05/09/2022), Disp: 21 tablet, Rfl: 0   Multiple Vitamins-Minerals (MULTIVITAMIN WITH MINERALS) tablet, Take 1 tablet by mouth daily., Disp: , Rfl:    ondansetron (ZOFRAN) 4 MG tablet, Take 1 tablet (4 mg total) by mouth every 8 (eight) hours as needed for nausea or vomiting., Disp: 20 tablet, Rfl: 0   rosuvastatin (CRESTOR) 5 MG tablet, Take 1 tablet (5 mg total) by mouth daily., Disp: 90 tablet, Rfl: 3   SUMAtriptan (IMITREX) 100 MG tablet, Take 50-100 mg po at onset of headache.  May repeat in 2 hours if headache persists or recurs. max 200 mg in 24 hours, Disp: 10 tablet, Rfl: 1  Allergies  Allergen Reactions   Penicillins Other (See Comments)    "lose my hearing"     Social History   Tobacco Use   Smoking status: Never   Smokeless tobacco: Never  Vaping Use   Vaping Use: Never used  Substance Use Topics   Alcohol use: Never   Drug use: Never      Chart Review Today: ***  Review of Systems     Objective:   There were no vitals filed for this visit.  There is no height or weight on file to calculate BMI.  Physical Exam   Results for orders placed or performed in visit on 03/24/22  Comprehensive metabolic panel  Result Value Ref Range   Glucose 86 70 - 99 mg/dL   BUN 11 8 - 27 mg/dL   Creatinine, Ser  1.61 0.57 - 1.00 mg/dL   eGFR 87 >09 UE/AVW/0.98   BUN/Creatinine Ratio 14 12 - 28   Sodium 144 134 - 144 mmol/L   Potassium 4.2 3.5 - 5.2 mmol/L   Chloride 107 (H) 96 - 106 mmol/L   CO2 24 20 - 29 mmol/L   Calcium 9.3 8.7 - 10.3 mg/dL   Total Protein 6.1 6.0 - 8.5 g/dL   Albumin 4.0 3.9 - 4.9 g/dL   Globulin, Total 2.1 1.5 - 4.5 g/dL   Albumin/Globulin Ratio 1.9 1.2 - 2.2   Bilirubin Total 0.9 0.0 - 1.2 mg/dL   Alkaline Phosphatase 84 44 - 121 IU/L   AST 30 0 - 40 IU/L   ALT 32 0 - 32 IU/L  Hemoglobin A1c  Result Value Ref Range   Hgb A1c MFr Bld 5.7 (H) 4.8 - 5.6 %   Est. average glucose Bld gHb Est-mCnc 117 mg/dL       Assessment & Plan:   1. Acute intractable headache, unspecified headache type CT yesterday -  Decadron shot yesterday Today toradol and phenergan with ride home She was prescribed abx for below, zofran and imitrix to try higher dose to get rid of HA Reviewed ER precautions with severity of sx Yesterday neuro exam nonfocal, no concerning deficits/abnormalitiy   2. Ethmoid sinusitis, unspecified chronicity Pt did have nasal sx, congestion, drainage and some pressure but on exam frontal and maxilarry sinuses not TTP - CT showed only mild bilateral ethmoid sinus disease Rx for doxycycline sent in   Narrative & Impression  CLINICAL DATA:  Provided history: Headache, new onset. Headache, increasing frequency or severity.   EXAM: CT HEAD WITHOUT CONTRAST   TECHNIQUE: Contiguous axial images were obtained from the base of the skull through the vertex without intravenous contrast.   RADIATION DOSE REDUCTION: This exam was performed according to the departmental dose-optimization program which includes automated exposure control, adjustment of the mA and/or kV according to patient size and/or use of iterative reconstruction technique.   COMPARISON:  Head CT 02/15/2021.   FINDINGS: Brain:   No age advanced or lobar predominant parenchymal atrophy.    There is no acute intracranial hemorrhage.   No demarcated cortical infarct.   No extra-axial fluid collection.   No evidence of an intracranial mass.   No midline shift.   Vascular: No hyperdense vessel.  Atherosclerotic calcifications.   Skull: No fracture or aggressive osseous lesion.   Sinuses/Orbits: No mass or acute finding within the imaged orbits. Mild mucosal thickening, and small-volume fluid, scattered within bilateral ethmoid air cells.   IMPRESSION: 1.  No evidence of an acute intracranial abnormality. 2. Mild bilateral  ethmoid sinus disease.     Electronically Signed   By: Jackey Loge D.O.   On: 05/09/2022 16:41        Danelle Berry, PA-C 05/10/22 12:51 PM

## 2022-05-22 NOTE — Patient Instructions (Incomplete)
Preventive Care 65 Years and Older, Female Preventive care refers to lifestyle choices and visits with your health care provider that can promote health and wellness. Preventive care visits are also called wellness exams. What can I expect for my preventive care visit? Counseling Your health care provider may ask you questions about your: Medical history, including: Past medical problems. Family medical history. Pregnancy and menstrual history. History of falls. Current health, including: Memory and ability to understand (cognition). Emotional well-being. Home life and relationship well-being. Sexual activity and sexual health. Lifestyle, including: Alcohol, nicotine or tobacco, and drug use. Access to firearms. Diet, exercise, and sleep habits. Work and work environment. Sunscreen use. Safety issues such as seatbelt and bike helmet use. Physical exam Your health care provider will check your: Height and weight. These may be used to calculate your BMI (body mass index). BMI is a measurement that tells if you are at a healthy weight. Waist circumference. This measures the distance around your waistline. This measurement also tells if you are at a healthy weight and may help predict your risk of certain diseases, such as type 2 diabetes and high blood pressure. Heart rate and blood pressure. Body temperature. Skin for abnormal spots. What immunizations do I need?  Vaccines are usually given at various ages, according to a schedule. Your health care provider will recommend vaccines for you based on your age, medical history, and lifestyle or other factors, such as travel or where you work. What tests do I need? Screening Your health care provider may recommend screening tests for certain conditions. This may include: Lipid and cholesterol levels. Hepatitis C test. Hepatitis B test. HIV (human immunodeficiency virus) test. STI (sexually transmitted infection) testing, if you are at  risk. Lung cancer screening. Colorectal cancer screening. Diabetes screening. This is done by checking your blood sugar (glucose) after you have not eaten for a while (fasting). Mammogram. Talk with your health care provider about how often you should have regular mammograms. BRCA-related cancer screening. This may be done if you have a family history of breast, ovarian, tubal, or peritoneal cancers. Bone density scan. This is done to screen for osteoporosis. Talk with your health care provider about your test results, treatment options, and if necessary, the need for more tests. Follow these instructions at home: Eating and drinking  Eat a diet that includes fresh fruits and vegetables, whole grains, lean protein, and low-fat dairy products. Limit your intake of foods with high amounts of sugar, saturated fats, and salt. Take vitamin and mineral supplements as recommended by your health care provider. Do not drink alcohol if your health care provider tells you not to drink. If you drink alcohol: Limit how much you have to 0-1 drink a day. Know how much alcohol is in your drink. In the [place] one drink equals one 12 oz bottle of beer (355 mL), one 5 oz glass of wine (148 mL), or one 1 oz glass of hard liquor (44 mL). Lifestyle Brush your teeth every morning and night with fluoride toothpaste. Floss one time each day. Exercise for at least 30 minutes 5 or more days each week. Do not use any products that contain nicotine or tobacco. These products include cigarettes, chewing tobacco, and vaping devices, such as e-cigarettes. If you need help quitting, ask your health care provider. Do not use drugs. If you are sexually active, practice safe sex. Use a condom or other form of protection in order to prevent STIs. Take aspirin only as told by   your health care provider. Make sure that you understand how much to take and what form to take. Work with your health care provider to find out whether it  is safe and beneficial for you to take aspirin daily. Ask your health care provider if you need to take a cholesterol-lowering medicine (statin). Find healthy ways to manage stress, such as: Meditation, yoga, or listening to music. Journaling. Talking to a trusted person. Spending time with friends and family. Minimize exposure to UV radiation to reduce your risk of skin cancer. Safety Always wear your seat belt while driving or riding in a vehicle. Do not drive: If you have been drinking alcohol. Do not ride with someone who has been drinking. When you are tired or distracted. While texting. If you have been using any mind-altering substances or drugs. Wear a helmet and other protective equipment during sports activities. If you have firearms in your house, make sure you follow all gun safety procedures. What's next? Visit your health care provider once a year for an annual wellness visit. Ask your health care provider how often you should have your eyes and teeth checked. Stay up to date on all vaccines. This information is not intended to replace advice given to you by your health care provider. Make sure you discuss any questions you have with your health care provider. Document Revised: 06/23/2020 Document Reviewed: 06/23/2020 [employer] Patient Education  2023 [employer]  

## 2022-05-23 ENCOUNTER — Ambulatory Visit (INDEPENDENT_AMBULATORY_CARE_PROVIDER_SITE_OTHER): Payer: Managed Care, Other (non HMO) | Admitting: Family Medicine

## 2022-05-23 ENCOUNTER — Encounter: Payer: Self-pay | Admitting: Family Medicine

## 2022-05-23 VITALS — BP 120/74 | HR 75 | Temp 98.0°F | Resp 16 | Ht 62.0 in | Wt 215.9 lb

## 2022-05-23 DIAGNOSIS — Z0000 Encounter for general adult medical examination without abnormal findings: Secondary | ICD-10-CM

## 2022-05-23 DIAGNOSIS — I10 Essential (primary) hypertension: Secondary | ICD-10-CM

## 2022-05-23 DIAGNOSIS — Z7189 Other specified counseling: Secondary | ICD-10-CM

## 2022-05-23 DIAGNOSIS — E559 Vitamin D deficiency, unspecified: Secondary | ICD-10-CM

## 2022-05-23 DIAGNOSIS — E119 Type 2 diabetes mellitus without complications: Secondary | ICD-10-CM

## 2022-05-23 DIAGNOSIS — R519 Headache, unspecified: Secondary | ICD-10-CM

## 2022-05-23 DIAGNOSIS — E785 Hyperlipidemia, unspecified: Secondary | ICD-10-CM

## 2022-05-23 NOTE — Progress Notes (Signed)
Patient: Tina Stephenson, Female    DOB: 15-Feb-1955, 67 y.o.   MRN: [id number] Danelle Berry, PA-C Visit Date: 05/23/2022  Today's Provider: Danelle Berry, PA-C   Chief Complaint  Patient presents with   Annual Exam   Subjective:   Annual physical exam:  Tina Stephenson is a 67 y.o. female who presents today for complete physical exam:  Exercise/Activity:  goal to get back to walking  Still a little dizzy which she is being careful with position changes and its limiting activity  Diet/nutrition:  cutting out stuff to keep loose more weight Sleep:  sleeping well  SDOH Screenings   Food Insecurity: Food Insecurity Present (05/10/2022)  Housing: Medium Risk (05/10/2022)  Transportation Needs: No Transportation Needs (05/10/2022)  Utilities: Not At Risk (05/23/2022)  Alcohol Screen: Low Risk  (05/23/2022)  Depression (PHQ2-9): Low Risk  (05/23/2022)  Recent Concern: Depression (PHQ2-9) - Medium Risk (05/10/2022)  Financial Resource Strain: Medium Risk (05/10/2022)  Physical Activity: Inactive (05/10/2022)  Social Connections: Moderately Isolated (05/10/2022)  Stress: Stress Concern Present (05/10/2022)  Tobacco Use: Low Risk  (05/23/2022)     USPSTF grade A and B recommendations - reviewed and addressed today  Depression:  Phq 9 completed today by patient, was reviewed by me with patient in the room PHQ score is neg, pt feels good, better    05/23/2022    2:32 PM 05/10/2022    3:19 PM 05/09/2022    1:56 PM 03/24/2022    8:23 AM  PHQ 2/9 Scores  PHQ - 2 Score 0 2 2 0  PHQ- 9 Score 0 7 7 0      05/23/2022    2:32 PM 05/10/2022    3:19 PM 05/09/2022    1:56 PM 03/24/2022    8:23 AM 11/07/2021    3:27 PM  Depression screen PHQ 2/9  Decreased Interest 0 1 1 0 0  Down, Depressed, Hopeless 0 1 1 0 0  PHQ - 2 Score 0 2 2 0 0  Altered sleeping 0 0 0 0 0  Tired, decreased energy 0 1 1 0 0  Change in appetite 0 1 1 0 0  Feeling bad or failure about yourself  0 1 1 0 0  Trouble concentrating 0 1 1 0  0  Moving slowly or fidgety/restless 0 0 0 0 0  Suicidal thoughts 0 1 1 0 0  PHQ-9 Score 0 7 7 0 0  Difficult doing work/chores Not difficult at all Somewhat difficult Somewhat difficult Not difficult at all Not difficult at all    Alcohol screening: Flowsheet Row Office Visit from 05/23/2022 in [facility]  AUDIT-C Score 0       Immunizations and Health Maintenance: Health Maintenance  Topic Date Due   Pneumonia Vaccine 14+ Years old (1 of 1 - PCV) 05/09/2023 (Originally 04/20/2022)   HEMOGLOBIN A1C  10/31/2022   Diabetic kidney evaluation - Urine ACR  11/10/2022   OPHTHALMOLOGY EXAM  11/27/2022   MAMMOGRAM  02/08/2023   Diabetic kidney evaluation - eGFR measurement  05/01/2023   FOOT EXAM  05/09/2023   DEXA SCAN  02/07/2025   COLONOSCOPY (Pts 45-13yrs Insurance coverage will need to be confirmed)  12/07/2025   DTaP/Tdap/Td (2 - Td or Tdap) 08/21/2028   Hepatitis C Screening  Completed   HIV Screening  Completed   HPV VACCINES  Aged Out   INFLUENZA VACCINE  Discontinued   COVID-19 Vaccine  Discontinued   Zoster Vaccines- Shingrix  Discontinued     Hep C Screening: done  STD testing and prevention (HIV/chl/gon/syphilis): did screening previously, not sexually active  Intimate partner violence:feels safe at home, single  Sexual History/Pain during Intercourse: Single  Menstrual History/LMP/Abnormal Bleeding: none No LMP recorded. Patient is postmenopausal.  Incontinence Symptoms: none  Breast cancer: UTD, due Jan 2025 Last Mammogram: *see HM list above  Cervical cancer screening: aged out, low risk Pt denies family hx of cancers - breast, ovarian, uterine, colon:     Osteoporosis:  dexa done previously and reviewed  Discussion on osteoporosis per age, including high calcium and vitamin D supplementation, weight bearing exercises Pt is supplementing with daily calcium/Vit D. Done recently - reviewed- Bone scan/dexa   Skin cancer:  she  sees dermatology regularly Discussed atypical lesions   Colorectal cancer:   Colonoscopy is UTD due 2027 Discussed concerning signs and sx of CRC, pt denies change in bowels, blood in stool  Lung cancer:   Low Dose CT Chest recommended if Age 57-80 years, 20 pack-year currently smoking OR have quit w/in 15years. Patient does not qualify.    Social History   Tobacco Use   Smoking status: Never   Smokeless tobacco: Never  Vaping Use   Vaping Use: Never used  Substance Use Topics   Alcohol use: Never   Drug use: Never     Flowsheet Row Office Visit from 05/23/2022 in [facility]  AUDIT-C Score 0       Family History  Problem Relation Age of Onset   COPD Mother    Cancer Mother    Heart disease Father    Hearing loss Father    Kidney disease Father    Breast cancer Paternal Aunt    Diabetes Brother    Diabetes Maternal Grandfather    Heart disease Maternal Grandfather    Obesity Maternal Grandfather    Depression Brother    Diabetes Brother    Hypertension Brother    Obesity Brother    Obesity Brother    Ovarian cancer Neg Hx    Colon cancer Neg Hx      Blood pressure/Hypertension: BP Readings from Last 3 Encounters:  05/23/22 120/74  05/10/22 102/64  05/09/22 126/68    Weight/Obesity: Wt Readings from Last 3 Encounters:  05/23/22 215 lb 14.4 oz (97.9 kg)  05/10/22 213 lb 4.8 oz (96.8 kg)  05/09/22 213 lb 4.8 oz (96.8 kg)   BMI Readings from Last 3 Encounters:  05/23/22 39.49 kg/m  05/10/22 39.01 kg/m  05/09/22 39.01 kg/m     Lipids:  Lab Results  Component Value Date   CHOL 128 08/12/2021   CHOL 130 05/06/2021   CHOL 115 05/07/2020   Lab Results  Component Value Date   HDL 58 08/12/2021   HDL 57 05/06/2021   HDL 44 05/07/2020   Lab Results  Component Value Date   LDLCALC 52 08/12/2021   LDLCALC 52 05/06/2021   LDLCALC 47 05/07/2020   Lab Results  Component Value Date   TRIG 99 08/12/2021   TRIG 120  05/06/2021   TRIG 142 05/07/2020   Lab Results  Component Value Date   CHOLHDL 2.3 05/06/2021   CHOLHDL 2.6 05/07/2020   CHOLHDL 3.7 01/05/2020   No results found for: "LDLDIRECT" Based on the results of lipid panel his/her cardiovascular risk factor ( using Po[place] Cohort )  in the next 10 years is: The ASCVD Risk score (Arnett DK, et al., 2019) failed to calculate for  the following reasons:   The valid total cholesterol range is 130 to 320 mg/dL  Glucose:  Glucose  Date Value Ref Range Status  05/01/2022 86 70 - 99 mg/dL Final  16/10/9602 92 70 - 99 mg/dL Final  54/09/8117 93 70 - 99 mg/dL Final    Advanced Care Planning:  A voluntary discussion about advance care planning including the explanation and discussion of advance directives.   Discussed health care proxy and Living will, and the patient was able to identify a health care proxy as Jay/brother.   Patient does not have a living will at present time.   Social History       Social History   Socioeconomic History   Marital status: Single    Spouse name: Not on file   Number of children: 0   Years of education: Not on file   Highest education level: Associate degree: occupational, Scientist, product/process development, or vocational program  Occupational History   Not on file  Tobacco Use   Smoking status: Never   Smokeless tobacco: Never  Vaping Use   Vaping Use: Never used  Substance and Sexual Activity   Alcohol use: Never   Drug use: Never   Sexual activity: Not Currently    Birth control/protection: None  Other Topics Concern   Not on file  Social History Narrative   Not on file   Social Determinants of Health   Financial Resource Strain: Medium Risk (05/10/2022)   Overall Financial Resource Strain (CARDIA)    Difficulty of Paying Living Expenses: Somewhat hard  Food Insecurity: Food Insecurity Present (05/10/2022)   Hunger Vital Sign    Worried About Running Out of Food in the Last Year: Sometimes true    Ran Out of Food in  the Last Year: Never true  Transportation Needs: No Transportation Needs (05/10/2022)   PRAPARE - Administrator, Civil Service (Medical): No    Lack of Transportation (Non-Medical): No  Physical Activity: Inactive (05/10/2022)   Exercise Vital Sign    Days of Exercise per Week: 0 days    Minutes of Exercise per Session: 0 min  Stress: Stress Concern Present (05/10/2022)   [employer] of Occupational Health - Occupational Stress Questionnaire    Feeling of Stress : Rather much  Social Connections: Moderately Isolated (05/10/2022)   Social Connection and Isolation Panel [NHANES]    Frequency of Communication with Friends and Family: Once a week    Frequency of Social Gatherings with Friends and Family: Once a week    Attends Religious Services: 1 to 4 times per year    Active Member of [employer] or Organizations: Yes    Attends Engineer, structural: More than 4 times per year    Marital Status: Never married    Family History        Family History  Problem Relation Age of Onset   COPD Mother    Cancer Mother    Heart disease Father    Hearing loss Father    Kidney disease Father    Breast cancer Paternal Aunt    Diabetes Brother    Diabetes Maternal Grandfather    Heart disease Maternal Grandfather    Obesity Maternal Grandfather    Depression Brother    Diabetes Brother    Hypertension Brother    Obesity Brother    Obesity Brother    Ovarian cancer Neg Hx    Colon cancer Neg Hx     Patient Active Problem List  Diagnosis Date Noted   Low back pain 11/07/2021   Class 2 obesity with body mass index (BMI) of 38.0 to 38.9 in adult 11/07/2021   Leg swelling 08/02/2020   Dyslipidemia 01/05/2020   Type 2 diabetes mellitus without complication, without long-term current use of insulin (HCC) 08/22/2018   Scoliosis 03/25/2018   Onychomycosis 02/21/2018   Vitamin D deficiency 03/19/2015   Essential hypertension 03/05/2015    Past Surgical History:   Procedure Laterality Date   APPENDECTOMY     BREAST SURGERY  10 years ago+   NASAL SEPTUM SURGERY     VASECTOMY     WISDOM TOOTH EXTRACTION       Current Outpatient Medications:    acetaminophen (TYLENOL) 500 MG tablet, Take 1,000 mg by mouth 2 (two) times daily., Disp: , Rfl:    acetaminophen-codeine (TYLENOL #3) 300-30 MG tablet, Take 1-2 tablets by mouth daily as needed for moderate pain., Disp: 60 tablet, Rfl: 2   albuterol (PROVENTIL) (2.5 MG/3ML) 0.083% nebulizer solution, Take 3 mLs (2.5 mg total) by nebulization every 6 (six) hours as needed for wheezing or shortness of breath., Disp: 150 mL, Rfl: 3   blood glucose meter kit and supplies KIT, Dispense based on patient and insurance preference. Use once daily as directed. (FOR ICD-10 E11.9)., Disp: 1 each, Rfl: 0   glucose blood test strip, Use as instructed, Disp: 100 each, Rfl: 1   hydrochlorothiazide (HYDRODIURIL) 12.5 MG tablet, TAKE 1 TABLET(12.5 MG) BY MOUTH DAILY (Patient taking differently: Take 6.25 mg by mouth daily. TAKE 1 TABLET(12.5 MG) BY MOUTH DAILY), Disp: 90 tablet, Rfl: 3   Ibuprofen (ADVIL PO), Take by mouth. 1/2 pill, Disp: , Rfl:    Lancets (ONETOUCH DELICA PLUS LANCET33G) MISC, USE TO CHECK BLOOD SUGAR TWICE DAILY, Disp: 100 each, Rfl: 5   meloxicam (MOBIC) 15 MG tablet, Take 1 tablet (15 mg total) by mouth daily., Disp: 30 tablet, Rfl: 1   metFORMIN (GLUCOPHAGE) 500 MG tablet, TAKE 2 TABLETS(1000 MG) BY MOUTH TWICE DAILY WITH A MEAL, Disp: 360 tablet, Rfl: 3   Multiple Vitamins-Minerals (MULTIVITAMIN WITH MINERALS) tablet, Take 1 tablet by mouth daily., Disp: , Rfl:    ondansetron (ZOFRAN) 4 MG tablet, Take 1 tablet (4 mg total) by mouth every 8 (eight) hours as needed for nausea or vomiting., Disp: 20 tablet, Rfl: 0   rosuvastatin (CRESTOR) 5 MG tablet, Take 1 tablet (5 mg total) by mouth daily., Disp: 90 tablet, Rfl: 3   SUMAtriptan (IMITREX) 100 MG tablet, Take 50-100 mg po at onset of headache.  May repeat  in 2 hours if headache persists or recurs. max 200 mg in 24 hours, Disp: 10 tablet, Rfl: 1  Allergies  Allergen Reactions   Penicillins Other (See Comments)    "lose my hearing"    Patient Care Team: Danelle Berry, PA-C as PCP - General (Family Medicine)   Chart Review: I personally reviewed active problem list, medication list, allergies, family history, social history, health maintenance, notes from last encounter, lab results, imaging with the patient/caregiver today.   Review of Systems  Constitutional: Negative.   HENT: Negative.    Eyes: Negative.   Respiratory: Negative.    Cardiovascular: Negative.   Gastrointestinal: Negative.   Endocrine: Negative.   Genitourinary: Negative.   Musculoskeletal: Negative.   Skin: Negative.   Allergic/Immunologic: Negative.   Neurological: Negative.   Hematological: Negative.   Psychiatric/Behavioral: Negative.    All other systems reviewed and are negative.  Objective:   Vitals:  Vitals:   05/23/22 1433  BP: 120/74  Pulse: 75  Resp: 16  Temp: 98 F (36.7 C)  TempSrc: Oral  SpO2: 99%  Weight: 215 lb 14.4 oz (97.9 kg)  Height: 5\' 2"  (1.575 m)    Body mass index is 39.49 kg/m.  Physical Exam Vitals and nursing note reviewed.  Constitutional:      General: She is not in acute distress.    Appearance: Normal appearance. She is well-developed. She is obese. She is not ill-appearing, toxic-appearing or diaphoretic.  HENT:     Head: Normocephalic and atraumatic.     Right Ear: Tympanic membrane, ear canal and external ear normal. There is no impacted cerumen.     Left Ear: Tympanic membrane, ear canal and external ear normal. There is no impacted cerumen.     Nose: Congestion (mild congestion, swelling, erythema) present. No rhinorrhea.     Mouth/Throat:     Mouth: Mucous membranes are moist.     Pharynx: Oropharynx is clear. Uvula midline. No oropharyngeal exudate or posterior oropharyngeal erythema.  Eyes:      General: Lids are normal. No scleral icterus.       Right eye: No discharge.        Left eye: No discharge.     Conjunctiva/sclera: Conjunctivae normal.  Neck:     Trachea: Phonation normal. No tracheal deviation.  Cardiovascular:     Rate and Rhythm: Normal rate and regular rhythm.     Pulses: Normal pulses.          Radial pulses are 2+ on the right side and 2+ on the left side.       Posterior tibial pulses are 2+ on the right side and 2+ on the left side.     Heart sounds: Normal heart sounds. No murmur heard.    No friction rub. No gallop.  Pulmonary:     Effort: Pulmonary effort is normal. No respiratory distress.     Breath sounds: Normal breath sounds. No stridor. No wheezing, rhonchi or rales.  Chest:     Chest wall: No tenderness.  Abdominal:     General: Bowel sounds are normal. There is no distension.     Palpations: Abdomen is soft.     Tenderness: There is no abdominal tenderness. There is no guarding or rebound.  Musculoskeletal:     Cervical back: Normal range of motion and neck supple.     Right lower leg: Edema present.     Left lower leg: Edema present.  Lymphadenopathy:     Cervical: No cervical adenopathy.  Skin:    General: Skin is warm and dry.     Capillary Refill: Capillary refill takes less than 2 seconds.     Coloration: Skin is not jaundiced or pale.     Findings: No rash.  Neurological:     Mental Status: She is alert. Mental status is at baseline.     Motor: No abnormal muscle tone.     Gait: Gait abnormal (antalgic, with cane).  Psychiatric:        Mood and Affect: Mood normal.        Speech: Speech normal.        Behavior: Behavior normal.       Fall Risk:    05/23/2022    2:31 PM 05/10/2022    3:19 PM 05/09/2022    1:56 PM 03/24/2022    8:23 AM 11/07/2021    3:27 PM  Fall Risk   Falls in the past year? 0 0 0 0 0  Number falls in past yr: 0 0 0 0 0  Injury with Fall? 0 0 0 0 0  Risk for fall due to : No Fall Risks No Fall Risks No  Fall Risks No Fall Risks No Fall Risks  Follow up Falls prevention discussed;Education provided;Falls evaluation completed Falls prevention discussed;Education provided;Falls evaluation completed Falls prevention discussed;Education provided;Falls evaluation completed Falls prevention discussed;Education provided;Falls evaluation completed Falls prevention discussed;Education provided;Falls evaluation completed    Functional Status Survey: Is the patient deaf or have difficulty hearing?: No Does the patient have difficulty seeing, even when wearing glasses/contacts?: No Does the patient have difficulty concentrating, remembering, or making decisions?: Yes Does the patient have difficulty walking or climbing stairs?: Yes Does the patient have difficulty dressing or bathing?: No Does the patient have difficulty doing errands alone such as visiting a doctor's office or shopping?: No   Assessment & Plan:    CPE completed today  USPSTF grade A and B recommendations reviewed with patient; age-appropriate recommendations, preventive care, screening tests, etc discussed and encouraged; healthy living encouraged; see AVS for patient education given to patient  Discussed importance of 150 minutes of physical activity weekly, AHA exercise recommendations given to pt in AVS/handout  Discussed importance of healthy diet:  eating lean meats and proteins, avoiding trans fats and saturated fats, avoid simple sugars and excessive carbs in diet, eat 6 servings of fruit/vegetables daily and drink plenty of water and avoid sweet beverages.    Recommended pt to do annual eye exam and routine dental exams/cleanings  Depression, alcohol, fall screening completed as documented above and per flowsheets  Advance Care planning information and packet discussed and offered today, encouraged pt to discuss with family members/spouse/partner/friends and complete Advanced directive packet and bring copy to office    Reviewed Health Maintenance: Health Maintenance  Topic Date Due   Pneumonia Vaccine 36+ Years old (1 of 1 - PCV) 05/09/2023 (Originally 04/20/2022)   HEMOGLOBIN A1C  10/31/2022   Diabetic kidney evaluation - Urine ACR  11/10/2022   OPHTHALMOLOGY EXAM  11/27/2022   MAMMOGRAM  02/08/2023   Diabetic kidney evaluation - eGFR measurement  05/01/2023   FOOT EXAM  05/09/2023   DEXA SCAN  02/07/2025   COLONOSCOPY (Pts 45-28yrs Insurance coverage will need to be confirmed)  12/07/2025   DTaP/Tdap/Td (2 - Td or Tdap) 08/21/2028   Hepatitis C Screening  Completed   HIV Screening  Completed   HPV VACCINES  Aged Out   INFLUENZA VACCINE  Discontinued   COVID-19 Vaccine  Discontinued   Zoster Vaccines- Shingrix  Discontinued    Immunizations: Immunization History  Administered Date(s) Administered   Hep A / Hep B 01/07/2019, 02/03/2019, 07/04/2019   Influenza,inj,quad, With Preservative 10/08/2013, 12/02/2014   Influenza-Unspecified 12/02/2014   Tdap 08/22/2018   Vaccines:  HPV: up to at age 40 , ask insurance if age between 27-45  Shingrix: duye - but she doesn't want to get - 55-64 yo and ask insurance if covered when patient above 65 yo Pneumonia: DUE- educated and discussed with patient - educated, encouraged to get with her age, hx of DM, CAP Flu:  out of season     ICD-10-CM   1. Annual physical exam  Z00.00 CBC with Differential/Platelet    Hemoglobin A1c    Comprehensive metabolic panel    Lipid panel    TSH    VITAMIN D 25 Hydroxy (Vit-D Deficiency,  Fractures)    2. Acute intractable headache, unspecified headache type  R51.9    improving since last 2 OV    Pt has labs ordered and will do prior to routine f/up OV in 5-6 months for the following dx    3. Essential hypertension  I10 Comprehensive metabolic panel   BP at home still SBP 90-100 - she will try holding HCTZ completely    4. Dyslipidemia  E78.5 Comprehensive metabolic panel    Lipid panel    5. Vitamin D  deficiency  E55.9 Comprehensive metabolic panel    VITAMIN D 25 Hydroxy (Vit-D Deficiency, Fractures)    6. Type 2 diabetes mellitus without complication, without long-term current use of insulin (HCC)  E11.9 Hemoglobin A1c    Comprehensive metabolic panel    7. Class 2 severe obesity with serious comorbidity and body mass index (BMI) of 39.0 to 39.9 in adult, unspecified obesity type (HCC)  E66.01 Hemoglobin A1c   Z68.39 Comprehensive metabolic panel    Lipid panel    TSH    VITAMIN D 25 Hydroxy (Vit-D Deficiency, Fractures)    8. Advanced care planning/counseling discussion  Z71.89    reviewed ACP decisions, considerations, packet/online resources      Dizziness episodes - she has occasionally when bending over and sitting up or sometimes randomly will occur - she will try d/c of HCTZ completely to see if that is at all a factor. Vestribular PT or neurology consults are options which we discussed today  Return in about 6 months (around 11/23/2022) for Routine follow-up.     Danelle Berry, PA-C 05/23/22 2:40 PM  [facility]

## 2022-05-29 ENCOUNTER — Ambulatory Visit: Payer: Managed Care, Other (non HMO) | Admitting: Family Medicine

## 2022-06-09 ENCOUNTER — Other Ambulatory Visit: Payer: Self-pay | Admitting: Family Medicine

## 2022-06-09 DIAGNOSIS — E785 Hyperlipidemia, unspecified: Secondary | ICD-10-CM

## 2022-06-14 ENCOUNTER — Other Ambulatory Visit: Payer: Self-pay | Admitting: Podiatry

## 2022-06-16 ENCOUNTER — Other Ambulatory Visit: Payer: Self-pay

## 2022-06-16 DIAGNOSIS — E119 Type 2 diabetes mellitus without complications: Secondary | ICD-10-CM

## 2022-06-19 MED ORDER — METFORMIN HCL 500 MG PO TABS
ORAL_TABLET | ORAL | 0 refills | Status: DC
Start: 2022-06-19 — End: 2022-09-18

## 2022-07-04 LAB — HM DIABETES EYE EXAM

## 2022-07-06 LAB — FECAL OCCULT BLOOD, IMMUNOCHEMICAL: IFOBT: NEGATIVE

## 2022-07-12 ENCOUNTER — Encounter: Payer: Self-pay | Admitting: Family Medicine

## 2022-09-01 LAB — BASIC METABOLIC PANEL: Glucose: 86

## 2022-09-01 LAB — LIPID PANEL
Cholesterol: 139 (ref 0–200)
HDL: 63 (ref 35–70)
LDL Cholesterol: 59
Triglycerides: 91 (ref 40–160)

## 2022-09-02 ENCOUNTER — Encounter: Payer: Self-pay | Admitting: Family Medicine

## 2022-09-04 ENCOUNTER — Encounter: Payer: Self-pay | Admitting: Family Medicine

## 2022-09-05 ENCOUNTER — Other Ambulatory Visit: Payer: Self-pay | Admitting: Family Medicine

## 2022-09-05 DIAGNOSIS — E785 Hyperlipidemia, unspecified: Secondary | ICD-10-CM

## 2022-09-12 DIAGNOSIS — H43392 Other vitreous opacities, left eye: Secondary | ICD-10-CM | POA: Diagnosis not present

## 2022-09-17 ENCOUNTER — Other Ambulatory Visit: Payer: Self-pay | Admitting: Family Medicine

## 2022-09-17 DIAGNOSIS — E119 Type 2 diabetes mellitus without complications: Secondary | ICD-10-CM

## 2022-10-04 ENCOUNTER — Ambulatory Visit: Payer: Medicare (Managed Care) | Admitting: Dermatology

## 2022-10-04 ENCOUNTER — Encounter: Payer: Self-pay | Admitting: Dermatology

## 2022-10-04 DIAGNOSIS — D492 Neoplasm of unspecified behavior of bone, soft tissue, and skin: Secondary | ICD-10-CM

## 2022-10-04 DIAGNOSIS — Z1283 Encounter for screening for malignant neoplasm of skin: Secondary | ICD-10-CM | POA: Diagnosis not present

## 2022-10-04 DIAGNOSIS — L578 Other skin changes due to chronic exposure to nonionizing radiation: Secondary | ICD-10-CM

## 2022-10-04 DIAGNOSIS — D1801 Hemangioma of skin and subcutaneous tissue: Secondary | ICD-10-CM

## 2022-10-04 DIAGNOSIS — W908XXA Exposure to other nonionizing radiation, initial encounter: Secondary | ICD-10-CM | POA: Diagnosis not present

## 2022-10-04 DIAGNOSIS — L720 Epidermal cyst: Secondary | ICD-10-CM

## 2022-10-04 DIAGNOSIS — L814 Other melanin hyperpigmentation: Secondary | ICD-10-CM | POA: Diagnosis not present

## 2022-10-04 DIAGNOSIS — Z86018 Personal history of other benign neoplasm: Secondary | ICD-10-CM | POA: Diagnosis not present

## 2022-10-04 DIAGNOSIS — L729 Follicular cyst of the skin and subcutaneous tissue, unspecified: Secondary | ICD-10-CM

## 2022-10-04 DIAGNOSIS — L821 Other seborrheic keratosis: Secondary | ICD-10-CM

## 2022-10-04 DIAGNOSIS — L820 Inflamed seborrheic keratosis: Secondary | ICD-10-CM

## 2022-10-04 DIAGNOSIS — D225 Melanocytic nevi of trunk: Secondary | ICD-10-CM | POA: Diagnosis not present

## 2022-10-04 DIAGNOSIS — Z872 Personal history of diseases of the skin and subcutaneous tissue: Secondary | ICD-10-CM | POA: Diagnosis not present

## 2022-10-04 DIAGNOSIS — D229 Melanocytic nevi, unspecified: Secondary | ICD-10-CM

## 2022-10-04 NOTE — Progress Notes (Signed)
Follow-Up Visit   Subjective  Tina Stephenson is a 67 y.o. female who presents for the following: Yearly  Skin Cancer Screening and Full Body Skin Exam, hx of precancers.  The patient presents for Total-Body Skin Exam (TBSE) for skin cancer screening and mole check. The patient has spots, moles and lesions to be evaluated, some may be new or changing and the patient may have concern these could be cancer.    The following portions of the chart were reviewed this encounter and updated as appropriate: medications, allergies, medical history  Review of Systems:  No other skin or systemic complaints except as noted in HPI or Assessment and Plan.  Objective  Well appearing patient in no apparent distress; mood and affect are within normal limits.  A full examination was performed including scalp, head, eyes, ears, nose, lips, neck, chest, axillae, abdomen, back, buttocks, bilateral upper extremities, bilateral lower extremities, hands, feet, fingers, toes, fingernails, and toenails. All findings within normal limits unless otherwise noted below.   Relevant physical exam findings are noted in the Assessment and Plan.  Scalp x 1 Stuck-on, waxy, tan-brown papules -- Discussed benign etiology and prognosis.   left mid to upper back spinal above braline 0.4 cm irregular brown macule        Assessment & Plan   SKIN CANCER SCREENING PERFORMED TODAY.  ACTINIC DAMAGE - Chronic condition, secondary to cumulative UV/sun exposure - diffuse scaly erythematous macules with underlying dyspigmentation - Recommend daily broad spectrum sunscreen SPF 30+ to sun-exposed areas, reapply every 2 hours as needed.  - Staying in the shade or wearing long sleeves, sun glasses (UVA+UVB protection) and wide brim hats (4-inch brim around the entire circumference of the hat) are also recommended for sun protection.  - Call for new or changing lesions.  LENTIGINES, SEBORRHEIC KERATOSES, HEMANGIOMAS - Benign  normal skin lesions - Benign-appearing - Call for any changes  MELANOCYTIC NEVI - Tan-brown and/or pink-flesh-colored symmetric macules and papules - Benign appearing on exam today - Observation - Call clinic for new or changing moles - Recommend daily use of broad spectrum spf 30+ sunscreen to sun-exposed areas.   EPIDERMAL INCLUSION CYST Exam: 1.1 cm Subcutaneous nodule at right upper back 5 cm lat to spine   Benign-appearing. Exam most consistent with an epidermal inclusion cyst. Discussed that a cyst is a benign growth that can grow over time and sometimes get irritated or inflamed. Recommend observation if it is not bothersome. Discussed option of surgical excision to remove it if it is growing, symptomatic, or other changes noted. Please call for new or changing lesions so they can be evaluated.  Return for excision removal   Inflamed seborrheic keratosis Scalp x 1  Symptomatic, irritating, patient would like treated.   Destruction of lesion - Scalp x 1 Complexity: simple   Destruction method: cryotherapy   Informed consent: discussed and consent obtained   Timeout:  patient name, date of birth, surgical site, and procedure verified Lesion destroyed using liquid nitrogen: Yes   Region frozen until ice ball extended beyond lesion: Yes   Outcome: patient tolerated procedure well with no complications   Post-procedure details: wound care instructions given    Neoplasm of skin left mid to upper back spinal above braline  Epidermal / dermal shaving  Lesion diameter (cm):  0.4 Informed consent: discussed and consent obtained   Timeout: patient name, date of birth, surgical site, and procedure verified   Procedure prep:  Patient was prepped and draped in  usual sterile fashion Prep type:  Isopropyl alcohol Anesthesia: the lesion was anesthetized in a standard fashion   Anesthetic:  1% lidocaine w/ epinephrine 1-100,000 buffered w/ 8.4% NaHCO3 Hemostasis achieved with:  pressure, aluminum chloride and electrodesiccation   Outcome: patient tolerated procedure well   Post-procedure details: sterile dressing applied and wound care instructions given   Dressing type: bandage and petrolatum    Specimen 1 - Surgical pathology Differential Diagnosis: R/O Dysplastic nevus   Check Margins: No  Related Procedures Anatomic Pathology Report  HISTORY OF DYSPLASTIC NEVUS Left medial mid scapula  No evidence of recurrence today Recommend regular full body skin exams Recommend daily broad spectrum sunscreen SPF 30+ to sun-exposed areas, reapply every 2 hours as needed.  Call if any new or changing lesions are noted between office visits    Return in about 1 year (around 10/04/2023) for TBSE, hx of Dysplastic nevus and return for cyst excision .  IAngelique Holm, CMA, am acting as scribe for Armida Sans, MD .   Documentation: I have reviewed the above documentation for accuracy and completeness, and I agree with the above.  Armida Sans, MD

## 2022-10-04 NOTE — Patient Instructions (Addendum)
Wound Care Instructions  Cleanse wound gently with soap and water once a day then pat dry with clean gauze. Apply a thin coat of Petrolatum (petroleum jelly, "Vaseline") over the wound (unless you have an allergy to this). We recommend that you use a new, sterile tube of Vaseline. Do not pick or remove scabs. Do not remove the yellow or white "healing tissue" from the base of the wound.  Cover the wound with fresh, clean, nonstick gauze and secure with paper tape. You may use Band-Aids in place of gauze and tape if the wound is small enough, but would recommend trimming much of the tape off as there is often too much. Sometimes Band-Aids can irritate the skin.  You should call the office for your biopsy report after 1 week if you have not already been contacted.  If you experience any problems, such as abnormal amounts of bleeding, swelling, significant bruising, significant pain, or evidence of infection, please call the office immediately.  FOR ADULT SURGERY PATIENTS: If you need something for pain relief you may take 1 extra strength Tylenol (acetaminophen) AND 2 Ibuprofen (200mg  each) together every 4 hours as needed for pain. (do not take these if you are allergic to them or if you have a reason you should not take them.) Typically, you may only need pain medication for 1 to 3 days.   Cryotherapy Aftercare  Wash gently with soap and water everyday.   Apply Vaseline and Band-Aid daily until healed.    Due to recent changes in healthcare laws, you may see results of your pathology and/or laboratory studies on MyChart before the doctors have had a chance to review them. We understand that in some cases there may be results that are confusing or concerning to you. Please understand that not all results are received at the same time and often the doctors may need to interpret multiple results in order to provide you with the best plan of care or course of treatment. Therefore, we ask that you  please give [place] 2 business days to thoroughly review all your results before contacting the office for clarification. Should we see a critical lab result, you will be contacted sooner.   If You Need Anything After Your Visit  If you have any questions or concerns for your doctor, please call our main line at [phone] and press option 4 to reach your doctor's medical assistant. If no one answers, please leave a voicemail as directed and we will return your call as soon as possible. Messages left after 4 pm will be answered the following business day.   You may also send [place] a message via MyChart. We typically respond to MyChart messages within 1-2 business days.  For prescription refills, please ask your pharmacy to contact our office. Our fax number is [phone].  If you have an urgent issue when the clinic is closed that cannot wait until the next business day, you can page your doctor at the number below.    Please note that while we do our best to be available for urgent issues outside of office hours, we are not available 24/7.   If you have an urgent issue and are unable to reach [place], you may choose to seek medical care at your doctor's office, retail clinic, urgent care center, or emergency room.  If you have a medical emergency, please immediately call 911 or go to the emergency department.  Pager Numbers  - Dr. Gwen Pounds: [phone]  - Dr.  Roseanne Reno: [phone]  - Dr. Katrinka Blazing: [phone]   In the event of inclement weather, please call our main line at [phone] for an update on the status of any delays or closures.  Dermatology Medication Tips: Please keep the boxes that topical medications come in in order to help keep track of the instructions about where and how to use these. Pharmacies typically print the medication instructions only on the boxes and not directly on the medication tubes.   If your medication is too expensive, please contact our office at  [phone] option 4 or send [place] a message through MyChart.   We are unable to tell what your co-pay for medications will be in advance as this is different depending on your insurance coverage. However, we may be able to find a substitute medication at lower cost or fill out paperwork to get insurance to cover a needed medication.   If a prior authorization is required to get your medication covered by your insurance company, please allow [place] 1-2 business days to complete this process.  Drug prices often vary depending on where the prescription is filled and some pharmacies may offer cheaper prices.  The website www.goodrx.com contains coupons for medications through different pharmacies. The prices here do not account for what the cost may be with help from insurance (it may be cheaper with your insurance), but the website can give you the price if you did not use any insurance.  - You can print the associated coupon and take it with your prescription to the pharmacy.  - You may also stop by our office during regular business hours and pick up a GoodRx coupon card.  - If you need your prescription sent electronically to a different pharmacy, notify our office through [facility] or by phone at [phone] option 4.     Si Usted Necesita Algo Despus de Su Visita  Tambin puede enviarnos un mensaje a travs de Clinical cytogeneticist. Por lo general respondemos a los mensajes de MyChart en el transcurso de 1 a 2 das hbiles.  Para renovar recetas, por favor pida a su farmacia que se ponga en contacto con nuestra oficina. Annie Sable de fax es [place] [phone].  Si tiene un asunto urgente cuando la clnica est cerrada y que no puede esperar hasta el siguiente da hbil, puede llamar/localizar a su doctor(a) al nmero que aparece a continuacin.   Por favor, tenga en cuenta que aunque hacemos todo lo posible para estar disponibles para asuntos urgentes fuera del horario de [place], no estamos  disponibles las 24 horas del da, los 7 [address]  [address] de la [place].   Si tiene un problema urgente y no puede comunicarse con nosotros, puede optar por buscar atencin mdica  en el consultorio de su doctor(a), en una clnica privada, en un centro de atencin urgente o en una sala de emergencias.  Si tiene Engineer, drilling, por favor llame inmediatamente al 911 o vaya a la sala de emergencias.  Nmeros de bper  - Dr. Gwen Pounds: [phone]  - Dra. Roseanne Reno: [phone]  - Dr. Katrinka Blazing: [phone]   En caso de inclemencias del tiempo, por favor llame a Lacy Duverney principal al [phone] para una actualizacin sobre el [place] de cualquier retraso o cierre.  Consejos para la medicacin en dermatologa: Por favor, guarde las cajas en las que vienen los medicamentos de uso tpico para ayudarle a seguir las instrucciones sobre dnde y cmo usarlos. Las farmacias generalmente imprimen las instrucciones del medicamento slo en las cajas  y no directamente en los tubos del medicamento.   Si su medicamento es muy caro, por favor, pngase en contacto con Rolm Gala llamando al [phone] y presione la opcin 4 o envenos un mensaje a travs de Clinical cytogeneticist.   No podemos decirle cul ser su copago por los medicamentos por adelantado ya que esto es diferente dependiendo de la cobertura de su seguro. Sin embargo, es posible que podamos encontrar un medicamento sustituto a Audiological scientist un formulario para que el seguro cubra el medicamento que se considera necesario.   Si se requiere una autorizacin previa para que su compaa de seguros [place] su medicamento, por favor permtanos de 1 a 2 das hbiles para completar [address]  Los precios de los medicamentos varan con frecuencia dependiendo del Environmental consultant de dnde se surte la receta y alguna farmacias pueden ofrecer precios ms baratos.  El sitio web www.goodrx.com tiene cupones para medicamentos de Health and safety inspector. Los precios aqu no  tienen en cuenta lo que podra costar con la ayuda del seguro (puede ser ms barato con su seguro), pero el sitio web puede darle el precio si no utiliz Tourist information centre manager.  - Puede imprimir el cupn correspondiente y llevarlo con su receta a la farmacia.  - Tambin puede pasar por nuestra oficina durante el horario de atencin regular y Education officer, museum una tarjeta de cupones de GoodRx.  - Si necesita que su receta se enve electrnicamente a una farmacia diferente, informe a nuestra oficina a travs de MyChart de [place] o por telfono llamando al [phone] y presione la opcin 4.

## 2022-10-11 ENCOUNTER — Telehealth: Payer: Self-pay

## 2022-10-11 LAB — ANATOMIC PATHOLOGY REPORT

## 2022-10-11 LAB — SPECIMEN STATUS REPORT

## 2022-10-11 NOTE — Telephone Encounter (Addendum)
  Called patient regarding bx results. She verbalized understanding and denied further question. Will recheck at patient's next follow up.    ----- Message from Armida Sans sent at 10/11/2022  1:28 PM EDT ----- Diagnosis synopsis: Comment Comment: Part 1-Left Middle to Upper Back, Spinal,Skin Biopsy: COMPOUND MELANOCYTIC NEVUS WITH MILD ARCHITECTURAL DISORDER AND 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  Mild dysplastic Recheck next visit

## 2022-10-17 ENCOUNTER — Ambulatory Visit: Payer: Medicare (Managed Care) | Admitting: Dermatology

## 2022-10-17 ENCOUNTER — Encounter: Payer: Self-pay | Admitting: Dermatology

## 2022-10-17 DIAGNOSIS — L720 Epidermal cyst: Secondary | ICD-10-CM | POA: Diagnosis not present

## 2022-10-17 DIAGNOSIS — D492 Neoplasm of unspecified behavior of bone, soft tissue, and skin: Secondary | ICD-10-CM

## 2022-10-17 DIAGNOSIS — L910 Hypertrophic scar: Secondary | ICD-10-CM | POA: Diagnosis not present

## 2022-10-17 NOTE — Patient Instructions (Addendum)
Wound Care Instructions  On the day following your surgery, you should begin doing daily dressing changes: Remove the old dressing and discard it. Cleanse the wound gently with tap water. This may be done in the shower or by placing a wet gauze pad directly on the wound and letting it soak for several minutes. It is important to gently remove any dried blood from the wound in order to encourage healing. This may be done by gently rolling a moistened Q-tip on the dried blood. Do not pick at the wound. If the wound should start to bleed, continue cleaning the wound, then place a moist gauze pad on the wound and hold pressure for a few minutes.  Make sure you then dry the skin surrounding the wound completely or the tape will not stick to the skin. Do not use cotton balls on the wound. After the wound is clean and dry, apply the ointment gently with a Q-tip. Cut a non-stick pad to fit the size of the wound. Lay the pad flush to the wound. If the wound is draining, you may want to reinforce it with a small amount of gauze on top of the non-stick pad for a little added compression to the area. Use the tape to seal the area completely. Select from the following with respect to your individual situation: If your wound has been stitched closed: continue the above steps 1-8 at least daily until your sutures are removed. If your wound has been left open to heal: continue steps 1-8 at least daily for the first 3-4 weeks. We would like for you to take a few extra precautions for at least the next week. Sleep with your head elevated on pillows if our wound is on your head. Do not bend over or lift heavy items to reduce the chance of elevated blood pressure to the wound Do not participate in particularly strenuous activities.   Below is a list of dressing supplies you might need.  Cotton-tipped applicators - Q-tips Gauze pads (2x2 and/or 4x4) - All-Purpose Sponges Non-stick dressing material - Telfa Tape -  Paper or Hypafix New and clean tube of petroleum jelly - Vaseline    Comments on Post-Operative Period Slight swelling and redness often appear around the wound. This is normal and will disappear within several days following the surgery. The healing wound will drain a brownish-red-yellow discharge during healing. This is a normal phase of wound healing. As the wound begins to heal, the drainage may increase in amount. Again, this drainage is normal. Notify us if the drainage becomes persistently bloody, excessively swollen, or intensely painful or develops a foul odor or red streaks.  If you should experience mild discomfort during the healing phase, you may take an aspirin-free medication such as Tylenol (acetaminophen). Notify us if the discomfort is severe or persistent. Avoid alcoholic beverages when taking pain medicine.  In Case of Wound Hemorrhage A wound hemorrhage is when the bandage suddenly becomes soaked with bright red blood and flows profusely. If this happens, sit down or lie down with your head elevated. If the wound has a dressing on it, do not remove the dressing. Apply pressure to the existing gauze. If the wound is not covered, use a gauze pad to apply pressure and continue applying the pressure for 20 minutes without peeking. DO NOT COVER THE WOUND WITH A LARGE TOWEL OR WASH CLOTH. Release your hand from the wound site but do not remove the dressing. If the bleeding has stopped,  gently clean around the wound. Leave the dressing in place for 24 hours if possible. This wait time allows the blood vessels to close off so that you do not spark a new round of bleeding by disrupting the newly clotted blood vessels with an immediate dressing change. If the bleeding does not subside, continue to hold pressure. If matters are out of your control, contact an After Hours clinic or go to the Emergency Room.  Due to recent changes in healthcare laws, you may see results of your pathology and/or  laboratory studies on MyChart before the doctors have had a chance to review them. We understand that in some cases there may be results that are confusing or concerning to you. Please understand that not all results are received at the same time and often the doctors may need to interpret multiple results in order to provide you with the best plan of care or course of treatment. Therefore, we ask that you please give [place] 2 business days to thoroughly review all your results before contacting the office for clarification. Should we see a critical lab result, you will be contacted sooner.   If You Need Anything After Your Visit  If you have any questions or concerns for your doctor, please call our main line at [phone] and press option 4 to reach your doctor's medical assistant. If no one answers, please leave a voicemail as directed and we will return your call as soon as possible. Messages left after 4 pm will be answered the following business day.   You may also send [place] a message via MyChart. We typically respond to MyChart messages within 1-2 business days.  For prescription refills, please ask your pharmacy to contact our office. Our fax number is [phone].  If you have an urgent issue when the clinic is closed that cannot wait until the next business day, you can page your doctor at the number below.    Please note that while we do our best to be available for urgent issues outside of office hours, we are not available 24/7.   If you have an urgent issue and are unable to reach [place], you may choose to seek medical care at your doctor's office, retail clinic, urgent care center, or emergency room.  If you have a medical emergency, please immediately call 911 or go to the emergency department.  Pager Numbers  - Dr. Gwen Pounds: [phone]  - Dr. Roseanne Reno: [phone]  - Dr. Katrinka Blazing: [phone]   In the event of inclement weather, please call our main line at [phone] for an  update on the status of any delays or closures.  Dermatology Medication Tips: Please keep the boxes that topical medications come in in order to help keep track of the instructions about where and how to use these. Pharmacies typically print the medication instructions only on the boxes and not directly on the medication tubes.   If your medication is too expensive, please contact our office at [phone] option 4 or send [place] a message through MyChart.   We are unable to tell what your co-pay for medications will be in advance as this is different depending on your insurance coverage. However, we may be able to find a substitute medication at lower cost or fill out paperwork to get insurance to cover a needed medication.   If a prior authorization is required to get your medication covered by your insurance company, please allow [place] 1-2 business days to complete this process.  Drug prices often vary depending on where the prescription is filled and some pharmacies may offer cheaper prices.  The website www.goodrx.com contains coupons for medications through different pharmacies. The prices here do not account for what the cost may be with help from insurance (it may be cheaper with your insurance), but the website can give you the price if you did not use any insurance.  - You can print the associated coupon and take it with your prescription to the pharmacy.  - You may also stop by our office during regular business hours and pick up a GoodRx coupon card.  - If you need your prescription sent electronically to a different pharmacy, notify our office through [facility] or by phone at [phone] option 4.     Si Usted Necesita Algo Despus de Su Visita  Tambin puede enviarnos un mensaje a travs de Clinical cytogeneticist. Por lo general respondemos a los mensajes de MyChart en el transcurso de 1 a 2 das hbiles.  Para renovar recetas, por favor pida a su farmacia que se ponga en contacto con  nuestra oficina. Annie Sable de fax es [place] [phone].  Si tiene un asunto urgente cuando la clnica est cerrada y que no puede esperar hasta el siguiente da hbil, puede llamar/localizar a su doctor(a) al nmero que aparece a continuacin.   Por favor, tenga en cuenta que aunque hacemos todo lo posible para estar disponibles para asuntos urgentes fuera del horario de [place], no estamos disponibles las 24 horas del da, los 7 [address]  [address] de la [place].   Si tiene un problema urgente y no puede comunicarse con nosotros, puede optar por buscar atencin mdica  en el consultorio de su doctor(a), en una clnica privada, en un centro de atencin urgente o en una sala de emergencias.  Si tiene Engineer, drilling, por favor llame inmediatamente al 911 o vaya a la sala de emergencias.  Nmeros de bper  - Dr. Gwen Pounds: [phone]  - Dra. Roseanne Reno: [phone]  - Dr. Katrinka Blazing: [phone]   En caso de inclemencias del tiempo, por favor llame a Lacy Duverney principal al [phone] para una actualizacin sobre el [place] de cualquier retraso o cierre.  Consejos para la medicacin en dermatologa: Por favor, guarde las cajas en las que vienen los medicamentos de uso tpico para ayudarle a seguir las instrucciones sobre dnde y cmo usarlos. Las farmacias generalmente imprimen las instrucciones del medicamento slo en las cajas y no directamente en los tubos del [place].   Si su medicamento es muy caro, por favor, pngase en contacto con Rolm Gala llamando al [phone] y presione la opcin 4 o envenos un mensaje a travs de Clinical cytogeneticist.   No podemos decirle cul ser su copago por los medicamentos por adelantado ya que esto es diferente dependiendo de la cobertura de su seguro. Sin embargo, es posible que podamos encontrar un medicamento sustituto a Audiological scientist un formulario para que el seguro cubra el medicamento que se considera necesario.   Si se requiere una autorizacin  previa para que su compaa de seguros [place] su medicamento, por favor permtanos de 1 a 2 das hbiles para completar [address]  Los precios de los medicamentos varan con frecuencia dependiendo del Environmental consultant de dnde se surte la receta y alguna farmacias pueden ofrecer precios ms baratos.  El sitio web www.goodrx.com tiene cupones para medicamentos de Health and safety inspector. Los precios aqu no tienen en cuenta lo que podra costar con la ayuda del seguro (puede ser  ms barato con su seguro), pero el sitio web puede darle el precio si no Visual merchandiser.  - Puede imprimir el cupn correspondiente y llevarlo con su receta a la farmacia.  - Tambin puede pasar por nuestra oficina durante el horario de atencin regular y Education officer, museum una tarjeta de cupones de GoodRx.  - Si necesita que su receta se enve electrnicamente a una farmacia diferente, informe a nuestra oficina a travs de MyChart de [place] o por telfono llamando al [phone] y presione la opcin 4.

## 2022-10-17 NOTE — Progress Notes (Signed)
   Follow-Up Visit   Subjective  Tina Stephenson is a 67 y.o. female who presents for the following: Excision of cyst on the right upper back 5 cm lat to spine   The following portions of the chart were reviewed this encounter and updated as appropriate: medications, allergies, medical history  Review of Systems:  No other skin or systemic complaints except as noted in HPI or Assessment and Plan.  Objective  Well appearing patient in no apparent distress; mood and affect are within normal limits.  A focused examination was performed of the following areas: The back  Relevant physical exam findings are noted in the Assessment and Plan.   Right Upper Back 5.0cm lat to spine Cystic pap 1.1 cm    Assessment & Plan   Neoplasm of skin Right Upper Back 5.0cm lat to spine  Skin excision  Lesion length (cm):  1.1 Lesion width (cm):  1.1 Margin per side (cm):  0 Total excision diameter (cm):  1.1 Informed consent: discussed and consent obtained   Timeout: patient name, date of birth, surgical site, and procedure verified   Procedure prep:  Patient was prepped and draped in usual sterile fashion Prep type:  Isopropyl alcohol and povidone-iodine Anesthesia: the lesion was anesthetized in a standard fashion   Anesthetic:  1% lidocaine w/ epinephrine 1-100,000 buffered w/ 8.4% NaHCO3 Instrument used: #15 blade   Hemostasis achieved with: pressure   Hemostasis achieved with comment:  Electrocautery Outcome: patient tolerated procedure well with no complications   Post-procedure details: sterile dressing applied and wound care instructions given   Dressing type: bandage and pressure dressing (Mupirocin)    Skin repair Complexity:  Complex Final length (cm):  2.2 Reason for type of repair: reduce tension to allow closure, reduce the risk of dehiscence, infection, and necrosis, reduce subcutaneous dead space and avoid a hematoma, allow closure of the large defect, preserve normal  anatomy, preserve normal anatomical and functional relationships and enhance both functionality and cosmetic results   Undermining: area extensively undermined   Undermining comment:  Undermining Defect 1.1 Subcutaneous layers (deep stitches):  Suture size:  3-0 Suture type: Vicryl (polyglactin 910)   Subcutaneous suture technique: Inverted Dermal. Fine/surface layer approximation (top stitches):  Suture size:  3-0 Suture type: nylon   Stitches: simple interrupted   Suture removal (days):  7 Hemostasis achieved with: pressure Outcome: patient tolerated procedure well with no complications   Post-procedure details: sterile dressing applied and wound care instructions given   Dressing type: bandage, pressure dressing and bacitracin (Mupirocin)    Cyst vs other excised today - pt defers Mupirocin ointment since she can't reach location to apply. Tegaderm applied to pressure dressing today.  Discussed risk of keloid/scar stretching   Related Procedures Anatomic Pathology Report   Return in about 1 week (around 10/24/2022) for suture removal.  I, Cari Caraway, CMA, am acting as scribe for Armida Sans, MD .  Documentation: I have reviewed the above documentation for accuracy and completeness, and I agree with the above.  Armida Sans, MD

## 2022-10-18 ENCOUNTER — Telehealth: Payer: Self-pay

## 2022-10-18 NOTE — Telephone Encounter (Signed)
Left pt msg to call if any problems after yesterday's surgery./sh 

## 2022-10-25 ENCOUNTER — Ambulatory Visit (INDEPENDENT_AMBULATORY_CARE_PROVIDER_SITE_OTHER): Payer: Medicare (Managed Care) | Admitting: Dermatology

## 2022-10-25 VITALS — BP 150/72

## 2022-10-25 DIAGNOSIS — L729 Follicular cyst of the skin and subcutaneous tissue, unspecified: Secondary | ICD-10-CM

## 2022-10-25 DIAGNOSIS — Z86018 Personal history of other benign neoplasm: Secondary | ICD-10-CM

## 2022-10-25 LAB — ANATOMIC PATHOLOGY REPORT

## 2022-10-25 NOTE — Progress Notes (Signed)
   Follow-Up Visit   Subjective  Tina Stephenson is a 67 y.o. female who presents for the following: Cyst bx proven, 1wk f/u for suture removal The patient has spots, moles and lesions to be evaluated, some may be new or changing and the patient may have concern these could be cancer.   The following portions of the chart were reviewed this encounter and updated as appropriate: medications, allergies, medical history  Review of Systems:  No other skin or systemic complaints except as noted in HPI or Assessment and Plan.  Objective  Well appearing patient in no apparent distress; mood and affect are within normal limits.   A focused examination was performed of the following areas: back  Relevant exam findings are noted in the Assessment and Plan.    Assessment & Plan   CYST  Bx proven R upper back Exam: healing excision site  Treatment Plan: Encounter for Removal of Sutures - Incision site at the R upper back is clean, dry and intact - Wound cleansed, sutures removed, wound cleansed and steri strips applied.  - Discussed pathology results showing cyst  - Patient advised to keep steri-strips dry until they fall off. - Scars remodel for a full year. - Once steri-strips fall off, patient can apply over-the-counter silicone scar cream each night to help with scar remodeling if desired. - Patient advised to call with any concerns or if they notice any new or changing lesions.    HISTORY OF DYSPLASTIC NEVUS No evidence of recurrence today Recommend regular full body skin exams Recommend daily broad spectrum sunscreen SPF 30+ to sun-exposed areas, reapply every 2 hours as needed.  Call if any new or changing lesions are noted between office visits  - L middle to upper back, healed bx site  Return for as scheduled for TBSE.  I, Ardis Rowan, RMA, am acting as scribe for Armida Sans, MD .   Documentation: I have reviewed the above documentation for accuracy and  completeness, and I agree with the above.  Armida Sans, MD

## 2022-10-25 NOTE — Patient Instructions (Signed)

## 2022-11-07 ENCOUNTER — Encounter: Payer: Self-pay | Admitting: Dermatology

## 2022-11-17 DIAGNOSIS — E559 Vitamin D deficiency, unspecified: Secondary | ICD-10-CM | POA: Diagnosis not present

## 2022-11-17 DIAGNOSIS — Z6839 Body mass index (BMI) 39.0-39.9, adult: Secondary | ICD-10-CM | POA: Diagnosis not present

## 2022-11-17 DIAGNOSIS — Z0000 Encounter for general adult medical examination without abnormal findings: Secondary | ICD-10-CM | POA: Diagnosis not present

## 2022-11-17 DIAGNOSIS — E119 Type 2 diabetes mellitus without complications: Secondary | ICD-10-CM | POA: Diagnosis not present

## 2022-11-17 DIAGNOSIS — I10 Essential (primary) hypertension: Secondary | ICD-10-CM | POA: Diagnosis not present

## 2022-11-17 DIAGNOSIS — E785 Hyperlipidemia, unspecified: Secondary | ICD-10-CM | POA: Diagnosis not present

## 2022-11-18 LAB — CBC WITH DIFFERENTIAL/PLATELET
Basophils Absolute: 0.1 10*3/uL (ref 0.0–0.2)
Basos: 1 %
EOS (ABSOLUTE): 0.1 10*3/uL (ref 0.0–0.4)
Eos: 2 %
Hematocrit: 39.5 % (ref 34.0–46.6)
Hemoglobin: 12.6 g/dL (ref 11.1–15.9)
Immature Grans (Abs): 0 10*3/uL (ref 0.0–0.1)
Immature Granulocytes: 0 %
Lymphocytes Absolute: 2.9 10*3/uL (ref 0.7–3.1)
Lymphs: 43 %
MCH: 29.1 pg (ref 26.6–33.0)
MCHC: 31.9 g/dL (ref 31.5–35.7)
MCV: 91 fL (ref 79–97)
Monocytes Absolute: 0.4 10*3/uL (ref 0.1–0.9)
Monocytes: 6 %
Neutrophils Absolute: 3.2 10*3/uL (ref 1.4–7.0)
Neutrophils: 48 %
Platelets: 285 10*3/uL (ref 150–450)
RBC: 4.33 x10E6/uL (ref 3.77–5.28)
RDW: 12.1 % (ref 11.7–15.4)
WBC: 6.6 10*3/uL (ref 3.4–10.8)

## 2022-11-18 LAB — COMPREHENSIVE METABOLIC PANEL
ALT: 27 [IU]/L (ref 0–32)
AST: 23 [IU]/L (ref 0–40)
Albumin: 4.4 g/dL (ref 3.9–4.9)
Alkaline Phosphatase: 134 [IU]/L — ABNORMAL HIGH (ref 44–121)
BUN/Creatinine Ratio: 28 (ref 12–28)
BUN: 22 mg/dL (ref 8–27)
Bilirubin Total: 1.1 mg/dL (ref 0.0–1.2)
CO2: 24 mmol/L (ref 20–29)
Calcium: 9.6 mg/dL (ref 8.7–10.3)
Chloride: 103 mmol/L (ref 96–106)
Creatinine, Ser: 0.79 mg/dL (ref 0.57–1.00)
Globulin, Total: 2.5 g/dL (ref 1.5–4.5)
Glucose: 96 mg/dL (ref 70–99)
Potassium: 4.4 mmol/L (ref 3.5–5.2)
Sodium: 143 mmol/L (ref 134–144)
Total Protein: 6.9 g/dL (ref 6.0–8.5)
eGFR: 83 mL/min/{1.73_m2} (ref >59)

## 2022-11-18 LAB — HEMOGLOBIN A1C
Est. average glucose Bld gHb Est-mCnc: 117 mg/dL
Hgb A1c MFr Bld: 5.7 % — ABNORMAL HIGH (ref 4.8–5.6)

## 2022-11-18 LAB — LIPID PANEL
Chol/HDL Ratio: 1.9 {ratio} (ref 0.0–4.4)
Cholesterol, Total: 163 mg/dL (ref 100–199)
HDL: 88 mg/dL (ref >39)
LDL Chol Calc (NIH): 61 mg/dL (ref 0–99)
Triglycerides: 73 mg/dL (ref 0–149)
VLDL Cholesterol Cal: 14 mg/dL (ref 5–40)

## 2022-11-18 LAB — TSH: TSH: 1.58 u[IU]/mL (ref 0.450–4.500)

## 2022-11-18 LAB — VITAMIN D 25 HYDROXY (VIT D DEFICIENCY, FRACTURES): Vit D, 25-Hydroxy: 42.1 ng/mL (ref 30.0–100.0)

## 2022-11-24 ENCOUNTER — Ambulatory Visit (INDEPENDENT_AMBULATORY_CARE_PROVIDER_SITE_OTHER): Payer: Medicare (Managed Care) | Admitting: Physician Assistant

## 2022-11-24 ENCOUNTER — Encounter: Payer: Self-pay | Admitting: Physician Assistant

## 2022-11-24 VITALS — BP 136/74 | HR 94 | Temp 97.5°F | Resp 16 | Ht 62.0 in | Wt 221.0 lb

## 2022-11-24 DIAGNOSIS — M25561 Pain in right knee: Secondary | ICD-10-CM | POA: Diagnosis not present

## 2022-11-24 DIAGNOSIS — I10 Essential (primary) hypertension: Secondary | ICD-10-CM | POA: Diagnosis not present

## 2022-11-24 DIAGNOSIS — E785 Hyperlipidemia, unspecified: Secondary | ICD-10-CM

## 2022-11-24 DIAGNOSIS — M25562 Pain in left knee: Secondary | ICD-10-CM | POA: Diagnosis not present

## 2022-11-24 DIAGNOSIS — Z7984 Long term (current) use of oral hypoglycemic drugs: Secondary | ICD-10-CM

## 2022-11-24 DIAGNOSIS — G8929 Other chronic pain: Secondary | ICD-10-CM | POA: Insufficient documentation

## 2022-11-24 DIAGNOSIS — E1159 Type 2 diabetes mellitus with other circulatory complications: Secondary | ICD-10-CM

## 2022-11-24 DIAGNOSIS — E119 Type 2 diabetes mellitus without complications: Secondary | ICD-10-CM

## 2022-11-24 MED ORDER — METFORMIN HCL 500 MG PO TABS
500.0000 mg | ORAL_TABLET | Freq: Every day | ORAL | 1 refills | Status: DC
Start: 2022-11-24 — End: 2023-05-22

## 2022-11-24 NOTE — Assessment & Plan Note (Signed)
Chronic, historic condition Patient is a longer taking HCTZ and appears to be doing well Reviewed 3 months worth of home BP logs and BP appears to be in goal She can continue with lifestyle measures for now If there is notable elevations I would recommend starting ACE inhibitor or ARB for renal protection Please see scanned documents for BP logs Follow-up in 3 months or sooner if concerns arise

## 2022-11-24 NOTE — Assessment & Plan Note (Signed)
Chronic, historic condition Appears well-managed on current regimen comprised of rosuvastatin 5 mg p.o. daily Continue current regimen Recheck lipid panel at next appointment Follow-up in 3 months or sooner if concerns arise

## 2022-11-24 NOTE — Progress Notes (Signed)
Established Patient Office Visit  Name: Tina Stephenson   MRN: [id number]    DOB: 1955/08/16   Date:11/24/2022  Today's Provider: Jacquelin Hawking, MHS, PA-C Introduced myself to the patient as a PA-C and provided education on APPs in clinical practice.         Subjective  Chief Complaint  Chief Complaint  Patient presents with   Hypertension   Hyperlipidemia   Diabetes    HPI  HYPERTENSION / HYPERLIPIDEMIA Satisfied with current treatment? yes Duration of hypertension: years BP monitoring frequency: a few times a day BP range:  BP medication side effects: no Past BP meds: none- previously on HCTZ Duration of hyperlipidemia: years Cholesterol medication side effects: no Cholesterol supplements: none Past cholesterol medications: rosuvastatin (crestor) Medication compliance: good compliance Aspirin: no Recent stressors: no Recurrent headaches: yes- she has hx of migraines but has not had one since May  Visual changes: no- reports ongoing floaters, she is following up with ophthalmology regularly  Palpitations: no Dyspnea: no Chest pain: no Lower extremity edema: yes-mild, usually at the end of the day. She does wear compression socks Dizzy/lightheaded: yes- yesterday when she got up from bed in the AM   Diabetes, Type 2 - Last A1c 5.7% - Medications: Metformin 500 mg PO BID  - Compliance: good  - Checking BG at home: She is checking in the AM - most results are typically in 100s-110s, highest in past month appears to be 135 ( reviewed 3 month logs with her during apt)  - Diet: She has been trying to monitor diet - she tries not to do any carbs at all  - Exercise: She is not engaged in regular exercise- limited by knee pain  - Eye exam: UTD - Foot exam: UTD - Microalbumin: ordered today  - Statin: On statin  - PNA vaccine: declined  - Denies symptoms of hypoglycemia, polyuria, polydipsia, numbness extremities, foot ulcers/trauma  Bilateral knee pain  Ongoing  for some time Reports it is worse with certain weather Does not notice a difference with being active vs resting  Pain is located along anterior aspect of knee joint  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  She takes tylenol for this and it provides some relief  She does sometimes take Ibuprofen for it as well      Patient Active Problem List   Diagnosis Date Noted   Chronic pain of both knees 11/24/2022   Low back pain 11/07/2021   Class 2 obesity with body mass index (BMI) of 38.0 to 38.9 in adult 11/07/2021   Leg swelling 08/02/2020   Dyslipidemia 01/05/2020   Type 2 diabetes mellitus without complication, without long-term current use of insulin (HCC) 08/22/2018   Scoliosis 03/25/2018   Onychomycosis 02/21/2018   Vitamin D deficiency 03/19/2015   Essential hypertension 03/05/2015    Past Surgical History:  Procedure Laterality Date   APPENDECTOMY     BREAST SURGERY  10 years ago+   NASAL SEPTUM SURGERY     VASECTOMY     WISDOM TOOTH EXTRACTION      Family History  Problem Relation Age of Onset   COPD Mother    Cancer Mother        basal cell   Heart disease Father    Hearing loss Father    Kidney disease Father    Depression Brother    Diabetes Brother    Hypertension Brother    Obesity Brother  Obesity Brother    Breast cancer Paternal Aunt    Diabetes Maternal Grandfather    Heart disease Maternal Grandfather    Obesity Maternal Grandfather    Ovarian cancer Neg Hx    Colon cancer Neg Hx     Social History   Tobacco Use   Smoking status: Never   Smokeless tobacco: Never  Substance Use Topics   Alcohol use: Never     Current Outpatient Medications:    acetaminophen (TYLENOL) 500 MG tablet, Take 1,000 mg by mouth 2 (two) times daily., Disp: , Rfl:    albuterol (PROVENTIL) (2.5 MG/3ML) 0.083% nebulizer solution, Take 3 mLs (2.5 mg total) by nebulization every 6 (six) hours as needed for wheezing or shortness of breath., Disp:  150 mL, Rfl: 3   blood glucose meter kit and supplies KIT, Dispense based on patient and insurance preference. Use once daily as directed. (FOR ICD-10 E11.9)., Disp: 1 each, Rfl: 0   glucose blood test strip, Use as instructed, Disp: 100 each, Rfl: 1   Ibuprofen (ADVIL PO), Take by mouth. 1/2 pill, Disp: , Rfl:    Lancets (ONETOUCH DELICA PLUS LANCET33G) MISC, USE TO CHECK BLOOD SUGAR TWICE DAILY, Disp: 100 each, Rfl: 5   Multiple Vitamins-Minerals (MULTIVITAMIN WITH MINERALS) tablet, Take 1 tablet by mouth daily., Disp: , Rfl:    ondansetron (ZOFRAN) 4 MG tablet, Take 1 tablet (4 mg total) by mouth every 8 (eight) hours as needed for nausea or vomiting., Disp: 20 tablet, Rfl: 0   rosuvastatin (CRESTOR) 5 MG tablet, TAKE 1 TABLET(5 MG) BY MOUTH DAILY, Disp: 90 tablet, Rfl: 1   metFORMIN (GLUCOPHAGE) 500 MG tablet, Take 1 tablet (500 mg total) by mouth daily with breakfast., Disp: 90 tablet, Rfl: 1  Allergies  Allergen Reactions   Penicillins Other (See Comments)    "lose my hearing"    I personally reviewed active problem list, medication list, allergies, health maintenance, notes from last encounter, lab results with the patient/caregiver today.   Review of Systems  Cardiovascular:  Negative for chest pain, palpitations and leg swelling.  Gastrointestinal:  Negative for constipation, diarrhea, nausea and vomiting.  Musculoskeletal:  Positive for joint pain.  Neurological:  Positive for dizziness and headaches.      Objective  Vitals:   11/24/22 1455  BP: 136/74  Pulse: 94  Resp: 16  Temp: (!) 97.5 F (36.4 C)  TempSrc: Temporal  SpO2: 99%  Weight: 221 lb (100.2 kg)  Height: 5\' 2"  (1.575 m)    Body mass index is 40.42 kg/m.  Physical Exam Vitals reviewed.  Constitutional:      General: She is awake.     Appearance: Normal appearance. She is well-developed and well-groomed.  HENT:     Head: Normocephalic and atraumatic.  Cardiovascular:     Rate and Rhythm: Normal  rate and regular rhythm.     Pulses: Normal pulses.          Radial pulses are 2+ on the right side and 2+ on the left side.     Heart sounds: Normal heart sounds. No murmur heard.    No friction rub. No gallop.  Pulmonary:     Effort: Pulmonary effort is normal.     Breath sounds: Normal breath sounds. No decreased air movement. No decreased breath sounds, wheezing, rhonchi or rales.  Musculoskeletal:     Cervical back: Normal range of motion.     Right knee: Crepitus present. No swelling, deformity or effusion. Normal range  of motion. No tenderness. No LCL laxity, MCL laxity, ACL laxity or PCL laxity. Normal alignment.     Instability Tests: Anterior drawer test negative. Posterior drawer test negative. Medial McMurray test negative and lateral McMurray test negative.     Left knee: Crepitus present. No swelling, deformity or effusion. Normal range of motion. Tenderness present over the medial joint line. No LCL laxity, MCL laxity, ACL laxity or PCL laxity.Normal alignment.     Instability Tests: Anterior drawer test negative. Posterior drawer test negative. Medial McMurray test negative and lateral McMurray test negative.     Right lower leg: No edema.     Left lower leg: No edema.       Legs:  Neurological:     General: No focal deficit present.     Mental Status: She is alert and oriented to person, place, and time. Mental status is at baseline.     GCS: GCS eye subscore is 4. GCS verbal subscore is 5. GCS motor subscore is 6.  Psychiatric:        Attention and Perception: Attention and perception normal.        Mood and Affect: Mood and affect normal.        Speech: Speech normal.        Behavior: Behavior normal. Behavior is cooperative.        Thought Content: Thought content normal.        Cognition and Memory: Cognition normal.      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 SEE COMMENTS.    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 SEE COMMENTS.    Comment: Comment     Comment: Part 1-The inked lateral and deep edges of the specimen are negative in the histologic planes of section.    Microscopic description: Comment     Comment: Part 1-The tissue demonstrates a compound melanocytic nevus with elongated, fused rete ridges, extension of the junctional component beyond the dermal portion and concentric lamellar fibrosis within the underlying papillary dermis. A mild superficial dermal lymphocytic infiltrate is identified. Cytologic atypia is mild.    Gross description: Comment     Comment: Part 1-The specimen is received in formalin labeled "Bonfanti, Candance, Left Middle To Upper Back". Received is a tan shave biopsy measuring 0.6 x 0.5 x 0.1 cm in greatest dimensions. The surgical margin  is inked blue. The specimen is bisected and entirely submitted in cassette 1.    Electronically signed by: Comment     Comment: Burnard Leigh, MD, Pathologist   CPT  code(s): Comment     Comment: [id number]   CPT Disclaimer: Comment     Comment: CPT codes, as published by the AMA, are provided for informational purpose only as the assignment of the CPT codes is the responsibility of the billing party.    Clinician provided ICD: Comment     Comment: D49.2   Pathologist provided ICD: Comment     Comment: Part 1-D22.5  Specimen status report     Status: None   Collection Time: 10/04/22 12:00 AM  Result Value Ref Range   specimen status report Comment     Comment: Please note Please note The date and/or time of collection was not indicated on the requisition as required by state and federal law.  The date of receipt of the specimen was used as the collection date if not supplied.   Anatomic Pathology Report     Status: None   Collection Time: 10/17/22  4:29 PM  Result Value Ref Range   Diagnosis synopsis: Comment     Comment: Part A-right upper back ,Skin Biopsy: FOLLICULAR CYST, INFUNDIBULAR TYPE WITH KELOIDAL SCAR.    Specimen: Comment     Comment: Part A-right upper back ,Skin Biopsy   Clinical diagnosis: Comment     Comment: Part A-r/o cyst vs other   Diagnosis: Comment     Comment: Part A-FOLLICULAR CYST, INFUNDIBULAR TYPE WITH KELOIDAL SCAR.    Microscopic description: Comment     Comment: Part A-There is a dermal cyst lined by stratified squamous epithelium having a granular layer and loose keratin contents. The tissue demonstrates a nodular scar with large eosinophilic collagen bundles.    Gross description: Comment     Comment: Part A-The specimen is received in formalin labeled "Griebel, Karalynn, right upper back ". Received is a ellipse of skin which has a lesion measuring 0.3 cm. The specimen measures 1.6 x 0.7 x 0.4 cm. It is inked and serially sectioned with the ends submitted in cassette 1. The remaining sections are submitted in cassette 2.    Electronically signed by: Comment     Comment: Estill Bakes, MD Board  Certified Dermatopathologist and Cytopathologist    CPT code(s): Comment     Comment: [id number]   CPT Disclaimer: Comment     Comment: CPT codes, as published by the AMA, are provided for informational purpose only as the assignment of the CPT codes is the responsibility of the billing party.    Clinician provided ICD: Comment     Comment: D49.2   Pathologist provided ICD: Comment     Comment: Part A-L72.0,L91.0  CBC with Differential/Platelet     Status: None   Collection Time: 11/17/22  1:30 PM  Result Value Ref Range   WBC 6.6 3.4 - 10.8 x10E3/uL   RBC 4.33 3.77 - 5.28 x10E6/uL   Hemoglobin 12.6 11.1 - 15.9 g/dL   Hematocrit 87.5 64.3 - 46.6 %   MCV 91 79 - 97 fL   MCH 29.1 26.6 - 33.0 pg   MCHC 31.9 31.5 - 35.7 g/dL   RDW 32.9 51.8 - 84.1 %   Platelets 285 150 - 450 x10E3/uL   Neutrophils 48 Not Estab. %   Lymphs 43 Not Estab. %   Monocytes 6 Not Estab. %   Eos 2  Not Estab. %   Basos 1 Not Estab. %   Neutrophils Absolute 3.2 1.4 - 7.0 x10E3/uL   Lymphocytes Absolute 2.9 0.7 - 3.1 x10E3/uL   Monocytes Absolute 0.4 0.1 - 0.9 x10E3/uL   EOS (ABSOLUTE) 0.1 0.0 - 0.4 x10E3/uL   Basophils Absolute 0.1 0.0 - 0.2 x10E3/uL   Immature Granulocytes 0 Not Estab. %   Immature Grans (Abs) 0.0 0.0 - 0.1 x10E3/uL  Hemoglobin A1c     Status: Abnormal   Collection Time: 11/17/22  1:30 PM  Result Value Ref Range   Hgb A1c MFr Bld 5.7 (H) 4.8 - 5.6 %    Comment:          Prediabetes: 5.7 - 6.4          Diabetes: >6.4          Glycemic control for adults with diabetes: <7.0    Est. average glucose Bld gHb Est-mCnc 117 mg/dL  Comprehensive metabolic panel     Status: Abnormal   Collection Time: 11/17/22  1:30 PM  Result Value Ref Range   Glucose 96 70 - 99 mg/dL   BUN 22 8 - 27 mg/dL   Creatinine, Ser 8.65 0.57 - 1.00 mg/dL   eGFR 83 >78 IO/NGE/9.52   BUN/Creatinine Ratio 28 12 - 28   Sodium 143 134 - 144 mmol/L   Potassium 4.4 3.5 - 5.2 mmol/L   Chloride 103 96 - 106 mmol/L    CO2 24 20 - 29 mmol/L   Calcium 9.6 8.7 - 10.3 mg/dL   Total Protein 6.9 6.0 - 8.5 g/dL   Albumin 4.4 3.9 - 4.9 g/dL   Globulin, Total 2.5 1.5 - 4.5 g/dL   Bilirubin Total 1.1 0.0 - 1.2 mg/dL   Alkaline Phosphatase 134 (H) 44 - 121 IU/L   AST 23 0 - 40 IU/L   ALT 27 0 - 32 IU/L  Lipid panel     Status: None   Collection Time: 11/17/22  1:30 PM  Result Value Ref Range   Cholesterol, Total 163 100 - 199 mg/dL   Triglycerides 73 0 - 149 mg/dL   HDL 88 >84 mg/dL   VLDL Cholesterol Cal 14 5 - 40 mg/dL   LDL Chol Calc (NIH) 61 0 - 99 mg/dL   Chol/HDL Ratio 1.9 0.0 - 4.4 ratio    Comment:                                   T. Chol/HDL Ratio                                             Men  Women                               1/2 Avg.Risk  3.4    3.3                                   Avg.Risk  5.0    4.4                                2X  Avg.Risk  9.6    7.1                                3X Avg.Risk 23.4   11.0   TSH     Status: None   Collection Time: 11/17/22  1:30 PM  Result Value Ref Range   TSH 1.580 0.450 - 4.500 uIU/mL  VITAMIN D 25 Hydroxy (Vit-D Deficiency, Fractures)     Status: None   Collection Time: 11/17/22  1:30 PM  Result Value Ref Range   Vit D, 25-Hydroxy 42.1 30.0 - 100.0 ng/mL    Comment: Vitamin D deficiency has been defined by the Institute of Medicine and an Endocrine Society practice guideline as a level of serum 25-OH vitamin D less than 20 ng/mL (1,2). The Endocrine Society went on to further define vitamin D insufficiency as a level between 21 and 29 ng/mL (2). 1. IOM (Institute of Medicine). 2010. Dietary reference    intakes for calcium and D. [place]: The    [employer]. 2. Holick MF, Binkley [place], Bischoff-Ferrari HA, et al.    Evaluation, treatment, and prevention of vitamin D    deficiency: an Endocrine Society clinical practice    guideline. JCEM. 2011 Jul; 96(7):1911-30.      PHQ2/9:    11/24/2022    2:54 PM 05/23/2022     2:32 PM 05/10/2022    3:19 PM 05/09/2022    1:56 PM 03/24/2022    8:23 AM  Depression screen PHQ 2/9  Decreased Interest 0 0 1 1 0  Down, Depressed, Hopeless 1 0 1 1 0  PHQ - 2 Score 1 0 2 2 0  Altered sleeping 0 0 0 0 0  Tired, decreased energy 0 0 1 1 0  Change in appetite 0 0 1 1 0  Feeling bad or failure about yourself  0 0 1 1 0  Trouble concentrating 0 0 1 1 0  Moving slowly or fidgety/restless 0 0 0 0 0  Suicidal thoughts 0 0 1 1 0  PHQ-9 Score 1 0 7 7 0  Difficult doing work/chores Somewhat difficult Not difficult at all Somewhat difficult Somewhat difficult Not difficult at all      Fall Risk:    11/24/2022    2:54 PM 05/23/2022    2:31 PM 05/10/2022    3:19 PM 05/09/2022    1:56 PM 03/24/2022    8:23 AM  Fall Risk   Falls in the past year? 0 0 0 0 0  Number falls in past yr: 0 0 0 0 0  Injury with Fall? 0 0 0 0 0  Risk for fall due to : No Fall Risks No Fall Risks No Fall Risks No Fall Risks No Fall Risks  Follow up Falls prevention discussed;Education provided;Falls evaluation completed Falls prevention discussed;Education provided;Falls evaluation completed Falls prevention discussed;Education provided;Falls evaluation completed Falls prevention discussed;Education provided;Falls evaluation completed Falls prevention discussed;Education provided;Falls evaluation completed      Functional Status Survey: Is the patient deaf or have difficulty hearing?: No Does the patient have difficulty seeing, even when wearing glasses/contacts?: Yes Does the patient have difficulty concentrating, remembering, or making decisions?: No Does the patient have difficulty walking or climbing stairs?: Yes Does the patient have difficulty dressing or bathing?: No Does the patient have difficulty doing errands alone such as visiting a doctor's office or shopping?: No    Assessment & Plan  Problem List Items Addressed This Visit       Cardiovascular and Mediastinum   Essential  hypertension    Chronic, historic condition Patient is a longer taking HCTZ and appears to be doing well Reviewed 3 months worth of home BP logs and BP appears to be in goal She can continue with lifestyle measures for now If there is notable elevations I would recommend starting ACE inhibitor or ARB for renal protection Please see scanned documents for BP logs Follow-up in 3 months or sooner if concerns arise        Endocrine   Type 2 diabetes mellitus without complication, without long-term current use of insulin (HCC) - Primary    Chronic, historic condition Reviewed 3 months of fasting glucose levels with patient, please see scanned documents Diabetes appears very well-managed today and patient is eager to get off of medications if able Given her lifestyle efforts and significant glucose control we will try reducing metformin to 500 mg p.o. every morning Recommend that she continues checking glucose levels and recording so that we can discuss at next appointment Continue lifestyle measures Follow-up in 3 months or sooner if concerns arise      Relevant Medications   metFORMIN (GLUCOPHAGE) 500 MG tablet   Other Relevant Orders   Microalbumin / creatinine urine ratio     Other   Dyslipidemia    Chronic, historic condition Appears well-managed on current regimen comprised of rosuvastatin 5 mg p.o. daily Continue current regimen Recheck lipid panel at next appointment Follow-up in 3 months or sooner if concerns arise      Chronic pain of both knees    Chronic, historic condition Patient reports multiple injuries to both knees as well as tibia of one of her legs Will get bilateral knee x-rays to assess for osteoarthritis If imaging demonstrates appropriate conditions we discussed potential for steroid injections.  She reports minimal relief with Tylenol and ibuprofen use at home Symptoms appear consistent with likely arthritis and she would likely benefit from intervention as  she is very motivated to exercise and continue with lifestyle measures to aid with chronic disease management Results of imaging to dictate further management Follow-up in the next few weeks      Relevant Orders   DG Knee Complete 4 Views Right   DG Knee Complete 4 Views Left     Return in about 4 weeks (around 12/22/2022) for Welcome to Medicare .   I, Jamarion Jumonville E Terricka Onofrio, PA-C, have reviewed all documentation for this visit. The documentation on 11/24/22 for the exam, diagnosis, procedures, and orders are all accurate and complete.   Jacquelin Hawking, MHS, PA-C [facility]

## 2022-11-24 NOTE — Assessment & Plan Note (Signed)
Chronic, historic condition Patient reports multiple injuries to both knees as well as tibia of one of her legs Will get bilateral knee x-rays to assess for osteoarthritis If imaging demonstrates appropriate conditions we discussed potential for steroid injections.  She reports minimal relief with Tylenol and ibuprofen use at home Symptoms appear consistent with likely arthritis and she would likely benefit from intervention as she is very motivated to exercise and continue with lifestyle measures to aid with chronic disease management Results of imaging to dictate further management Follow-up in the next few weeks

## 2022-11-24 NOTE — Assessment & Plan Note (Signed)
Chronic, historic condition Reviewed 3 months of fasting glucose levels with patient, please see scanned documents Diabetes appears very well-managed today and patient is eager to get off of medications if able Given her lifestyle efforts and significant glucose control we will try reducing metformin to 500 mg p.o. every morning Recommend that she continues checking glucose levels and recording so that we can discuss at next appointment Continue lifestyle measures Follow-up in 3 months or sooner if concerns arise

## 2022-11-27 ENCOUNTER — Ambulatory Visit
Admission: RE | Admit: 2022-11-27 | Discharge: 2022-11-27 | Disposition: A | Discharge status: Home / Self Care | Payer: Medicare (Managed Care) | Source: Ambulatory Visit | Attending: Physician Assistant | Admitting: Physician Assistant

## 2022-11-27 ENCOUNTER — Encounter: Payer: Self-pay | Admitting: Family Medicine

## 2022-11-27 ENCOUNTER — Ambulatory Visit
Admission: RE | Admit: 2022-11-27 | Discharge: 2022-11-27 | Disposition: A | Discharge status: Home / Self Care | Payer: Medicare (Managed Care) | Attending: Physician Assistant | Admitting: Physician Assistant

## 2022-11-27 ENCOUNTER — Encounter: Payer: Self-pay | Admitting: Physician Assistant

## 2022-11-27 DIAGNOSIS — M25562 Pain in left knee: Secondary | ICD-10-CM | POA: Insufficient documentation

## 2022-11-27 DIAGNOSIS — M25561 Pain in right knee: Secondary | ICD-10-CM | POA: Insufficient documentation

## 2022-11-27 DIAGNOSIS — M1711 Unilateral primary osteoarthritis, right knee: Secondary | ICD-10-CM | POA: Diagnosis not present

## 2022-11-27 DIAGNOSIS — G8929 Other chronic pain: Secondary | ICD-10-CM | POA: Diagnosis not present

## 2022-11-27 DIAGNOSIS — E119 Type 2 diabetes mellitus without complications: Secondary | ICD-10-CM | POA: Diagnosis not present

## 2022-11-27 DIAGNOSIS — M1712 Unilateral primary osteoarthritis, left knee: Secondary | ICD-10-CM | POA: Diagnosis not present

## 2022-11-28 LAB — MICROALBUMIN / CREATININE URINE RATIO
Creatinine, Urine: 46.2 mg/dL
Microalb/Creat Ratio: 9 mg/g{creat} (ref 0–29)
Microalbumin, Urine: 4.1 ug/mL

## 2022-11-29 NOTE — Progress Notes (Signed)
Urine microalbumin is in normal ranges

## 2022-12-14 NOTE — Progress Notes (Signed)
Right knee is notable for severe osteoarthritis. No acute fracture, dislocation or effusion. If desired we can try to schedule joint injection- one knee at a time and must be separated by at least a week to avoid side effects.

## 2022-12-14 NOTE — Progress Notes (Signed)
Xray demonstrates severe osteoarthritis of left knee- no notable effusion, dislocation or injury, Eligible for joint injection if desired. One knee at a time and must be separated by at least one week to avoid side effects.

## 2022-12-16 ENCOUNTER — Encounter: Payer: Self-pay | Admitting: Physician Assistant

## 2023-01-12 ENCOUNTER — Ambulatory Visit (INDEPENDENT_AMBULATORY_CARE_PROVIDER_SITE_OTHER): Payer: Medicare (Managed Care) | Admitting: Physician Assistant

## 2023-01-12 ENCOUNTER — Encounter: Payer: Self-pay | Admitting: Physician Assistant

## 2023-01-12 VITALS — BP 126/68 | HR 91 | Resp 16 | Ht 62.0 in | Wt 239.0 lb

## 2023-01-12 DIAGNOSIS — Z0000 Encounter for general adult medical examination without abnormal findings: Secondary | ICD-10-CM | POA: Diagnosis not present

## 2023-01-12 DIAGNOSIS — Z1231 Encounter for screening mammogram for malignant neoplasm of breast: Secondary | ICD-10-CM

## 2023-01-12 DIAGNOSIS — M654 Radial styloid tenosynovitis [de Quervain]: Secondary | ICD-10-CM | POA: Diagnosis not present

## 2023-01-12 MED ORDER — MELOXICAM 7.5 MG PO TABS: 7.5000 mg | ORAL_TABLET | Freq: Every day | ORAL | 0 refills | Status: DC

## 2023-01-12 NOTE — Progress Notes (Signed)
 Subjective:    Tina Stephenson is a 68 y.o. female who presents for a Welcome to Medicare exam.   Cardiac Risk Factors include: none      Objective:    Today's Vitals   01/12/23 1030  BP: 126/68  Pulse: 91  Resp: 16  SpO2: 99%  Weight: 239 lb (108.4 kg)  Height: 5' 2 (1.575 m)  PainSc: 3   PainLoc: Knee  Body mass index is 43.71 kg/m.  Medications Outpatient Encounter Medications as of 01/12/2023  Medication Sig   acetaminophen  (TYLENOL ) 500 MG tablet Take 1,000 mg by mouth 2 (two) times daily.   albuterol  (PROVENTIL ) (2.5 MG/3ML) 0.083% nebulizer solution Take 3 mLs (2.5 mg total) by nebulization every 6 (six) hours as needed for wheezing or shortness of breath.   blood glucose meter kit and supplies KIT Dispense based on patient and insurance preference. Use once daily as directed. (FOR ICD-10 E11.9).   glucose blood test strip Use as instructed   Ibuprofen (ADVIL PO) Take by mouth. 1/2 pill   Lancets (ONETOUCH DELICA PLUS LANCET33G) MISC USE TO CHECK BLOOD SUGAR TWICE DAILY   metFORMIN  (GLUCOPHAGE ) 500 MG tablet Take 1 tablet (500 mg total) by mouth daily with breakfast.   Multiple Vitamins-Minerals (MULTIVITAMIN WITH MINERALS) tablet Take 1 tablet by mouth daily.   ondansetron  (ZOFRAN ) 4 MG tablet Take 1 tablet (4 mg total) by mouth every 8 (eight) hours as needed for nausea or vomiting.   rosuvastatin  (CRESTOR ) 5 MG tablet TAKE 1 TABLET(5 MG) BY MOUTH DAILY   No facility-administered encounter medications on file as of 01/12/2023.     History: Past Medical History:  Diagnosis Date   Allergy    Penicillin   Arthritis    neck, fingers, knees   Bronchitis    Bruised toe 12/20/2020   Cervical polyp 08/22/2018   Depression    Diabetes mellitus without complication (HCC)    Dysplastic nevus 12/27/2021   Left medial mid scapula. Mild atypia   Dysplastic nevus 10/04/2022   left middle to upper back spinal mild   Gallbladder polyp    GERD (gastroesophageal reflux  disease)    History of cyst of breast 03/25/2018   Had removal of 3 cysts of breasts in approx 2008-2012.   Hypertension    Hypertensive disorder 03/05/2015   Liver tumor (benign)    Nocturia 08/22/2018   Nontraumatic complete tear of left rotator cuff 08/15/2021   Nontraumatic complete tear of right rotator cuff 07/29/2021   Sacrum and coccyx fracture (HCC)    Sacrum and coccyx fracture, sequela 02/21/2018   Occurred in Skiing Accident at age 14, still have residual in bilateral low back.   Sciatica    Tendinopathy of right biceps tendon 09/24/2020   Traumatic tear of supraspinatus tendon of left shoulder 09/24/2020   Traumatic tear of supraspinatus tendon of right shoulder 09/24/2020   Past Surgical History:  Procedure Laterality Date   APPENDECTOMY     BREAST SURGERY  10 years ago+   NASAL SEPTUM SURGERY     VASECTOMY     WISDOM TOOTH EXTRACTION      Family History  Problem Relation Age of Onset   COPD Mother    Cancer Mother        basal cell   Heart disease Father    Hearing loss Father    Kidney disease Father    Depression Brother    Diabetes Brother    Hypertension Brother    Obesity  Brother    Obesity Brother    Breast cancer Paternal Aunt    Diabetes Maternal Grandfather    Heart disease Maternal Grandfather    Obesity Maternal Grandfather    Ovarian cancer Neg Hx    Colon cancer Neg Hx    Social History   Occupational History   Not on file  Tobacco Use   Smoking status: Never   Smokeless tobacco: Never  Vaping Use   Vaping status: Never Used  Substance and Sexual Activity   Alcohol use: Never   Drug use: Never   Sexual activity: Not Currently    Birth control/protection: None    Tobacco Counseling Counseling given: Not Answered   Immunizations and Health Maintenance Immunization History  Administered Date(s) Administered   Hep A / Hep B 01/07/2019, 02/03/2019, 07/04/2019   Influenza,inj,quad, With Preservative 10/08/2013, 12/02/2014    Influenza-Unspecified 12/02/2014   Tdap 08/22/2018   There are no preventive care reminders to display for this patient.  Activities of Daily Living    01/12/2023   10:28 AM 11/24/2022    2:54 PM  In your present state of health, do you have any difficulty performing the following activities:  Hearing? 0 0  Vision? 1 1  Difficulty concentrating or making decisions? 0 0  Walking or climbing stairs? 1 1  Dressing or bathing? 0 0  Doing errands, shopping? 0 0  Preparing Food and eating ? N   Using the Toilet? N   In the past six months, have you accidently leaked urine? Y   Do you have problems with loss of bowel control? N   Managing your Medications? N   Managing your Finances? N   Housekeeping or managing your Housekeeping? N     Physical Exam   Physical Exam (optional), or other factors deemed appropriate based on the beneficiary's medical and social history and current clinical standards.   Advanced Directives: Does Patient Have a Medical Advance Directive?: No Would patient like information on creating a medical advance directive?: No - Patient declined  EKG:  EKG performed today in office EKG demonstrates normal sinus rhythm , rate of ~90 bpm Today's EKG was compared to previous EKG from 08/22/2018 and there are no significant changes        Assessment:    This is a routine wellness examination for this patient .   Vision/Hearing screen Hearing Screening   500Hz  1000Hz  2000Hz  4000Hz   Right ear Pass Pass Pass Pass  Left ear Pass Pass Pass Pass   Vision Screening   Right eye Left eye Both eyes  Without correction     With correction 20/20 20/20 20/20      Goals   None     Depression Screen    11/24/2022    2:54 PM 05/23/2022    2:32 PM 05/10/2022    3:19 PM 05/09/2022    1:56 PM  PHQ 2/9 Scores  PHQ - 2 Score 1 0 2 2  PHQ- 9 Score 1 0 7 7     Fall Risk    01/12/2023   10:25 AM  Fall Risk   Falls in the past year? 0  Number falls in past yr: 0   Injury with Fall? 0  Risk for fall due to : No Fall Risks  Follow up Falls prevention discussed    Cognitive Function:        01/12/2023   10:26 AM  6CIT Screen  What Year? 0 points  What month? 0  points  What time? 0 points  Count back from 20 0 points  Months in reverse 0 points  Repeat phrase 0 points  Total Score 0 points    Patient Care Team: Leavy Mole, PA-C as PCP - General (Family Medicine)     Plan:     I have personally reviewed and noted the following in the patient's chart:   Medical and social history Use of alcohol, tobacco or illicit drugs  Current medications and supplements Functional ability and status Nutritional status Physical activity Advanced directives List of other physicians Hospitalizations, surgeries, and ER visits in previous 12 months Vitals Screenings to include cognitive, depression, and falls Referrals and appointments  In addition, I have reviewed and discussed with patient certain preventive protocols, quality metrics, and best practice recommendations. A written personalized care plan for preventive services as well as general preventive health recommendations were provided to patient.     Rocky BRAVO Stacye Noori, PA-C 01/12/2023

## 2023-01-12 NOTE — Patient Instructions (Addendum)
 Please call to schedule your mammogram and/or bone density: [facility]  Address: [address], [place], [place] [address] Phone: [phone]  [facility] [address] [place],  [place]  [address] Phone: [phone]     I have sent in a script for Meloxicam  for you to take once per day. DO NOT use other NSAIDs while taking this medication (ibuprofen, motrin, aleve, advil, naproxen, etc)  You can take Tylenol  as needed for additional pain management I recommend using a thumb spicca splint and warm compresses to help with the inflammation and to stabilize the area.   If it is not getting better in 2-4 weeks or getting worse, please let us  know or go to [facility] for further evaluation.

## 2023-01-12 NOTE — Progress Notes (Signed)
 Acute Office Visit   Patient: Tina Stephenson   DOB: 1955-03-26   68 y.o. Female  MRN: [id number] Visit Date: 01/12/2023  Today's healthcare provider: Rocky BRAVO Mariyam Remington, PA-C  Introduced myself to the patient as a PA-C and provided education on APPs in clinical practice.    Chief Complaint  Patient presents with   Medicare Wellness   Subjective    HPI   She reports for the last 3 weeks she has had right thumb pain States she woke up and it was stuck in extended position and hurt a lot to bend back towards palm States she has been seeing her chiropractor for this for about 3 weeks which has improved ROM but she is still having pain She reports it did have some swelling and redness but this has decreased She denies known injury to the area  She has taken Tylenol  and Advil during the first two weeks but she is not doing this anymore She states the thumb is very stiff in the AM and it takes awhile for her to able to move it     Medications: Outpatient Medications Prior to Visit  Medication Sig   acetaminophen  (TYLENOL ) 500 MG tablet Take 1,000 mg by mouth 2 (two) times daily.   albuterol  (PROVENTIL ) (2.5 MG/3ML) 0.083% nebulizer solution Take 3 mLs (2.5 mg total) by nebulization every 6 (six) hours as needed for wheezing or shortness of breath.   blood glucose meter kit and supplies KIT Dispense based on patient and insurance preference. Use once daily as directed. (FOR ICD-10 E11.9).   glucose blood test strip Use as instructed   Lancets (ONETOUCH DELICA PLUS LANCET33G) MISC USE TO CHECK BLOOD SUGAR TWICE DAILY   metFORMIN  (GLUCOPHAGE ) 500 MG tablet Take 1 tablet (500 mg total) by mouth daily with breakfast.   Multiple Vitamins-Minerals (MULTIVITAMIN WITH MINERALS) tablet Take 1 tablet by mouth daily.   ondansetron  (ZOFRAN ) 4 MG tablet Take 1 tablet (4 mg total) by mouth every 8 (eight) hours as needed for nausea or vomiting.   rosuvastatin  (CRESTOR ) 5 MG tablet TAKE 1 TABLET(5  MG) BY MOUTH DAILY   [DISCONTINUED] Ibuprofen (ADVIL PO) Take by mouth. 1/2 pill   No facility-administered medications prior to visit.    Review of Systems  Musculoskeletal:        Right thumb pain          Objective    BP 126/68   Pulse 91   Resp 16   Ht 5' 2 (1.575 m)   Wt 239 lb (108.4 kg)   SpO2 99%   BMI 43.71 kg/m     Physical Exam Vitals reviewed.  Constitutional:      General: She is awake.     Appearance: Normal appearance. She is well-developed and well-groomed.  HENT:     Head: Normocephalic and atraumatic.  Pulmonary:     Effort: Pulmonary effort is normal.  Musculoskeletal:     Right wrist: Snuff box tenderness present.     Right hand: Tenderness present. No swelling. Decreased range of motion. Normal strength. Normal sensation. Normal capillary refill. Normal pulse.     Comments: Positive Finkelstein testing on the right  She is not able to make a fist with thumb enclosed   Neurological:     Mental Status: She is alert.  Psychiatric:        Behavior: Behavior is cooperative.       No results found for  any visits on 01/12/23.  Assessment & Plan      No follow-ups on file.        Problem List Items Addressed This Visit   None Visit Diagnoses       Welcome to Medicare preventive visit    -  Primary   Relevant Orders   EKG 12-Lead   Hearing screening   Visual acuity screening     Screening mammogram for breast cancer       Relevant Orders   MM 3D SCREENING MAMMOGRAM BILATERAL BREAST     De Quervain's tenosynovitis, right       Relevant Medications   meloxicam  (MOBIC ) 7.5 MG tablet      Acute, new concern She reports ongoing snuffbox/ thumb tenderness along right hand that is not resolving with chiropractor interventions and OTC regimen Suspect De Quervains tenosynovitis based on physical exam findings and symptoms Will start Meloxicam  7.5 mg PO every day for inflammation relief Recommend thumb spicca splint and warm  compresses as tolerated for assistance with relief If not improving with home measures- may need to get imaging and refer to Ortho Follow up as needed for persistent or progressing symptoms    No follow-ups on file.   I, Beckie Viscardi E Stachia Slutsky, PA-C, have reviewed all documentation for this visit. The documentation on 01/12/23 for the exam, diagnosis, procedures, and orders are all accurate and complete.   Rocky Mt, MHS, PA-C [facility]

## 2023-02-08 ENCOUNTER — Other Ambulatory Visit: Payer: Self-pay | Admitting: Physician Assistant

## 2023-02-08 DIAGNOSIS — M654 Radial styloid tenosynovitis [de Quervain]: Secondary | ICD-10-CM

## 2023-02-09 NOTE — Telephone Encounter (Signed)
Requested medications are due for refill today.  yes  Requested medications are on the active medications list.  yes  Last refill. 01/12/2023 #30 0 rf  Future visit scheduled.   yes  Notes to clinic.  New medication to pt. Please review for refill.    Requested Prescriptions  Pending Prescriptions Disp Refills   meloxicam (MOBIC) 7.5 MG tablet [Pharmacy Med Name: MELOXICAM 7.5MG  TABLETS] 30 tablet 0    Sig: TAKE 1 TABLET(7.5 MG) BY MOUTH DAILY     Analgesics:  COX2 Inhibitors Failed - 02/09/2023  9:23 AM      Failed - Manual Review: Labs are only required if the patient has taken medication for more than 8 weeks.      Passed - HGB in normal range and within 360 days    Hemoglobin  Date Value Ref Range Status  11/17/2022 12.6 11.1 - 15.9 g/dL Final         Passed - Cr in normal range and within 360 days    Creatinine, Ser  Date Value Ref Range Status  11/17/2022 0.79 0.57 - 1.00 mg/dL Final         Passed - HCT in normal range and within 360 days    Hematocrit  Date Value Ref Range Status  11/17/2022 39.5 34.0 - 46.6 % Final         Passed - AST in normal range and within 360 days    AST  Date Value Ref Range Status  11/17/2022 23 0 - 40 IU/L Final         Passed - ALT in normal range and within 360 days    ALT  Date Value Ref Range Status  11/17/2022 27 0 - 32 IU/L Final         Passed - eGFR is 30 or above and within 360 days    GFR calc Af Amer  Date Value Ref Range Status  01/05/2020 102 >59 mL/min/1.73 Final    Comment:    **In accordance with recommendations from the NKF-ASN Task force,**   [facility] is in the process of updating its eGFR calculation to the   2021 CKD-EPI creatinine equation that estimates kidney function   without a race variable.    GFR calc non Af Amer  Date Value Ref Range Status  01/05/2020 89 >59 mL/min/1.73 Final   eGFR  Date Value Ref Range Status  11/17/2022 83 >59 mL/min/1.73 Final         Passed - Patient is not  pregnant      Passed - Valid encounter within last 12 months    Recent Outpatient Visits           4 weeks ago Welcome to [employer] preventive visit   [facility] Mecum, Erin E, PA-C   2 months ago Type 2 diabetes mellitus without complication, without long-term current use of insulin (HCC)   [facility] Mecum, Oswaldo Conroy, PA-C   8 months ago Annual physical exam   [facility] Danelle Berry, PA-C   9 months ago Acute intractable headache, unspecified headache type   [facility] Danelle Berry, PA-C   9 months ago Acute intractable headache, unspecified headache type   [facility] Danelle Berry, PA-C       Future Appointments             In 3 months Danelle Berry, PA-C [facility]   In 7 months Gwen Pounds Dineen Kid, MD [facility]

## 2023-02-26 ENCOUNTER — Ambulatory Visit: Payer: Medicare (Managed Care) | Admitting: Family Medicine

## 2023-02-27 ENCOUNTER — Encounter: Payer: Self-pay | Admitting: Family Medicine

## 2023-02-27 ENCOUNTER — Other Ambulatory Visit: Payer: Self-pay | Admitting: Family Medicine

## 2023-02-27 ENCOUNTER — Telehealth: Payer: Self-pay

## 2023-02-27 DIAGNOSIS — E785 Hyperlipidemia, unspecified: Secondary | ICD-10-CM

## 2023-02-27 MED ORDER — ROSUVASTATIN CALCIUM 5 MG PO TABS: 5.0000 mg | ORAL_TABLET | Freq: Every day | ORAL | 0 refills | Status: DC

## 2023-02-27 NOTE — Telephone Encounter (Signed)
 Refilled

## 2023-03-05 ENCOUNTER — Encounter: Payer: Self-pay | Admitting: Family Medicine

## 2023-03-08 ENCOUNTER — Other Ambulatory Visit: Payer: Self-pay | Admitting: Family Medicine

## 2023-03-08 DIAGNOSIS — M654 Radial styloid tenosynovitis [de Quervain]: Secondary | ICD-10-CM

## 2023-03-12 ENCOUNTER — Ambulatory Visit
Admission: RE | Admit: 2023-03-12 | Discharge: 2023-03-12 | Disposition: A | Discharge status: Home / Self Care | Payer: Medicare (Managed Care) | Source: Ambulatory Visit | Attending: Physician Assistant | Admitting: Physician Assistant

## 2023-03-12 DIAGNOSIS — Z1231 Encounter for screening mammogram for malignant neoplasm of breast: Secondary | ICD-10-CM | POA: Insufficient documentation

## 2023-03-16 NOTE — Progress Notes (Signed)
Mammogram did not find evidence of malignancy Recommended repeat screening mammogram in one year.

## 2023-03-20 ENCOUNTER — Encounter: Payer: Self-pay | Admitting: Family Medicine

## 2023-03-20 MED ORDER — SUMATRIPTAN SUCCINATE 25 MG PO TABS: ORAL_TABLET | ORAL | 5 refills | Status: DC

## 2023-05-20 ENCOUNTER — Other Ambulatory Visit: Payer: Self-pay | Admitting: Physician Assistant

## 2023-05-20 DIAGNOSIS — E119 Type 2 diabetes mellitus without complications: Secondary | ICD-10-CM

## 2023-05-22 NOTE — Telephone Encounter (Signed)
 Requested Prescriptions  Pending Prescriptions Disp Refills   metFORMIN  (GLUCOPHAGE ) 500 MG tablet [Pharmacy Med Name: METFORMIN  500MG  TABLETS] 90 tablet 0    Sig: TAKE 1 TABLET(500 MG) BY MOUTH DAILY WITH BREAKFAST     Endocrinology:  Diabetes - Biguanides Failed - 05/22/2023 12:34 PM      Failed - HBA1C is between 0 and 7.9 and within 180 days    Hemoglobin A1C  Date Value Ref Range Status  08/12/2021 5.6  Final   Hgb A1c MFr Bld  Date Value Ref Range Status  11/17/2022 5.7 (H) 4.8 - 5.6 % Final    Comment:             Prediabetes: 5.7 - 6.4          Diabetes: >6.4          Glycemic control for adults with diabetes: <7.0          Failed - B12 Level in normal range and within 720 days    No results found for: "VITAMINB12"       Failed - Valid encounter within last 6 months    Recent Outpatient Visits   None     Future Appointments             In 5 months Elta Halter, MD [facility]            Passed - Cr in normal range and within 360 days    Creatinine, Ser  Date Value Ref Range Status  11/17/2022 0.79 0.57 - 1.00 mg/dL Final         Passed - eGFR in normal range and within 360 days    GFR calc Af Amer  Date Value Ref Range Status  01/05/2020 102 >59 mL/min/1.73 Final    Comment:    **In accordance with recommendations from the NKF-ASN Task force,**   [facility] is in the process of updating its eGFR calculation to the   2021 CKD-EPI creatinine equation that estimates kidney function   without a race variable.    GFR calc non Af Amer  Date Value Ref Range Status  01/05/2020 89 >59 mL/min/1.73 Final   eGFR  Date Value Ref Range Status  11/17/2022 83 >59 mL/min/1.73 Final         Passed - CBC within normal limits and completed in the last 12 months    WBC  Date Value Ref Range Status  11/17/2022 6.6 3.4 - 10.8 x10E3/uL Final   RBC  Date Value Ref Range Status  11/17/2022 4.33 3.77 - 5.28 x10E6/uL Final   Hemoglobin   Date Value Ref Range Status  11/17/2022 12.6 11.1 - 15.9 g/dL Final   Hematocrit  Date Value Ref Range Status  11/17/2022 39.5 34.0 - 46.6 % Final   MCHC  Date Value Ref Range Status  11/17/2022 31.9 31.5 - 35.7 g/dL Final   [facility]  Date Value Ref Range Status  11/17/2022 29.1 26.6 - 33.0 pg Final   MCV  Date Value Ref Range Status  11/17/2022 91 79 - 97 fL Final   No results found for: "PLTCOUNTKUC", "LABPLAT", "POCPLA" RDW  Date Value Ref Range Status  11/17/2022 12.1 11.7 - 15.4 % Final

## 2023-05-25 ENCOUNTER — Ambulatory Visit: Payer: Self-pay | Admitting: Family Medicine

## 2023-05-25 ENCOUNTER — Other Ambulatory Visit: Payer: Self-pay | Admitting: Family Medicine

## 2023-05-25 DIAGNOSIS — E785 Hyperlipidemia, unspecified: Secondary | ICD-10-CM

## 2023-05-28 NOTE — Telephone Encounter (Signed)
 Requested Prescriptions  Pending Prescriptions Disp Refills   rosuvastatin  (CRESTOR ) 5 MG tablet [Pharmacy Med Name: ROSUVASTATIN  5MG  TABLETS] 90 tablet 1    Sig: TAKE 1 TABLET(5 MG) BY MOUTH DAILY     Cardiovascular:  Antilipid - Statins 2 Failed - 05/28/2023  2:23 PM      Failed - Valid encounter within last 12 months    Recent Outpatient Visits   None     Future Appointments             In 4 months Elta Halter, MD [facility]            Failed - Lipid Panel in normal range within the last 12 months    Cholesterol, Total  Date Value Ref Range Status  11/17/2022 163 100 - 199 mg/dL Final   LDL Chol Calc (NIH)  Date Value Ref Range Status  11/17/2022 61 0 - 99 mg/dL Final   HDL  Date Value Ref Range Status  11/17/2022 88 >39 mg/dL Final   Triglycerides  Date Value Ref Range Status  11/17/2022 73 0 - 149 mg/dL Final         Passed - Cr in normal range and within 360 days    Creatinine, Ser  Date Value Ref Range Status  11/17/2022 0.79 0.57 - 1.00 mg/dL Final         Passed - Patient is not pregnant

## 2023-06-12 ENCOUNTER — Ambulatory Visit: Payer: Medicare (Managed Care) | Admitting: Family Medicine

## 2023-06-12 ENCOUNTER — Encounter: Payer: Self-pay | Admitting: Family Medicine

## 2023-06-12 VITALS — BP 126/80 | HR 82 | Resp 16 | Ht 62.0 in | Wt 253.0 lb

## 2023-06-12 DIAGNOSIS — M654 Radial styloid tenosynovitis [de Quervain]: Secondary | ICD-10-CM

## 2023-06-12 DIAGNOSIS — G8929 Other chronic pain: Secondary | ICD-10-CM | POA: Diagnosis not present

## 2023-06-12 DIAGNOSIS — E1159 Type 2 diabetes mellitus with other circulatory complications: Secondary | ICD-10-CM | POA: Diagnosis not present

## 2023-06-12 DIAGNOSIS — M25562 Pain in left knee: Secondary | ICD-10-CM

## 2023-06-12 DIAGNOSIS — R519 Headache, unspecified: Secondary | ICD-10-CM | POA: Diagnosis not present

## 2023-06-12 DIAGNOSIS — E66813 Obesity, class 3: Secondary | ICD-10-CM | POA: Diagnosis not present

## 2023-06-12 DIAGNOSIS — I10 Essential (primary) hypertension: Secondary | ICD-10-CM | POA: Diagnosis not present

## 2023-06-12 DIAGNOSIS — Z6841 Body Mass Index (BMI) 40.0 and over, adult: Secondary | ICD-10-CM

## 2023-06-12 DIAGNOSIS — M25561 Pain in right knee: Secondary | ICD-10-CM | POA: Diagnosis not present

## 2023-06-12 DIAGNOSIS — Z7984 Long term (current) use of oral hypoglycemic drugs: Secondary | ICD-10-CM | POA: Diagnosis not present

## 2023-06-12 DIAGNOSIS — E785 Hyperlipidemia, unspecified: Secondary | ICD-10-CM | POA: Diagnosis not present

## 2023-06-12 DIAGNOSIS — E119 Type 2 diabetes mellitus without complications: Secondary | ICD-10-CM

## 2023-06-12 MED ORDER — MELOXICAM 7.5 MG PO TABS: 7.5000 mg | ORAL_TABLET | Freq: Every day | ORAL | 2 refills | Status: DC | PRN

## 2023-06-12 MED ORDER — SUMATRIPTAN SUCCINATE 25 MG PO TABS: ORAL_TABLET | ORAL | 11 refills | Status: DC

## 2023-06-12 NOTE — Assessment & Plan Note (Signed)
 More severe over the past couple days after doing yard work R>L causing severe pain, stiffness and limping, also making her left hip hurt Trial of mobic  Offered ortho, no recent eval or injections

## 2023-06-12 NOTE — Progress Notes (Unsigned)
 Name: Tina Stephenson   MRN: [id number]    DOB: 01-27-1955   Date:06/12/2023       Progress Note  Chief Complaint  Patient presents with  . Medical Management of Chronic Issues  . Diabetes  . Hypertension  . Hyperlipidemia     Subjective:   Tina Stephenson is a 68 y.o. female, presents to clinic for routine follow up on chronic conditions     Current Outpatient Medications:  .  acetaminophen  (TYLENOL ) 500 MG tablet, Take 1,000 mg by mouth 2 (two) times daily., Disp: , Rfl:  .  albuterol  (PROVENTIL ) (2.5 MG/3ML) 0.083% nebulizer solution, Take 3 mLs (2.5 mg total) by nebulization every 6 (six) hours as needed for wheezing or shortness of breath., Disp: 150 mL, Rfl: 3 .  blood glucose meter kit and supplies KIT, Dispense based on patient and insurance preference. Use once daily as directed. (FOR ICD-10 E11.9)., Disp: 1 each, Rfl: 0 .  glucose blood test strip, Use as instructed, Disp: 100 each, Rfl: 1 .  Lancets (ONETOUCH DELICA PLUS LANCET33G) MISC, USE TO CHECK BLOOD SUGAR TWICE DAILY, Disp: 100 each, Rfl: 5 .  metFORMIN  (GLUCOPHAGE ) 500 MG tablet, TAKE 1 TABLET(500 MG) BY MOUTH DAILY WITH BREAKFAST, Disp: 90 tablet, Rfl: 0 .  Multiple Vitamins-Minerals (MULTIVITAMIN WITH MINERALS) tablet, Take 1 tablet by mouth daily., Disp: , Rfl:  .  ondansetron  (ZOFRAN ) 4 MG tablet, Take 1 tablet (4 mg total) by mouth every 8 (eight) hours as needed for nausea or vomiting., Disp: 20 tablet, Rfl: 0 .  rosuvastatin  (CRESTOR ) 5 MG tablet, TAKE 1 TABLET(5 MG) BY MOUTH DAILY, Disp: 90 tablet, Rfl: 1 .  SUMAtriptan  (IMITREX ) 25 MG tablet, Take 25-100 mg po at onset of headache. May repeat in 2 hours if headache persists or recurs. max 200 mg in 24 hours, Disp: 10 tablet, Rfl: 5 .  meloxicam  (MOBIC ) 7.5 MG tablet, Take 1 tablet (7.5 mg total) by mouth daily as needed for pain., Disp: 90 tablet, Rfl: 1  Patient Active Problem List   Diagnosis Date Noted  . Chronic pain of both knees 11/24/2022  . Low back  pain 11/07/2021  . Class 2 obesity with body mass index (BMI) of 38.0 to 38.9 in adult 11/07/2021  . Leg swelling 08/02/2020  . Dyslipidemia 01/05/2020  . Type 2 diabetes mellitus without complication, without long-term current use of insulin (HCC) 08/22/2018  . Scoliosis 03/25/2018  . Onychomycosis 02/21/2018  . Vitamin D  deficiency 03/19/2015  . Essential hypertension 03/05/2015    Past Surgical History:  Procedure Laterality Date  . APPENDECTOMY    . BREAST SURGERY  10 years ago+  . NASAL SEPTUM SURGERY    . VASECTOMY    . WISDOM TOOTH EXTRACTION      Family History  Problem Relation Age of Onset  . COPD Mother   . Cancer Mother        basal cell  . Heart disease Father   . Hearing loss Father   . Kidney disease Father   . Depression Brother   . Diabetes Brother   . Hypertension Brother   . Obesity Brother   . Obesity Brother   . Breast cancer Paternal Aunt   . Diabetes Maternal Grandfather   . Heart disease Maternal Grandfather   . Obesity Maternal Grandfather   . Ovarian cancer Neg Hx   . Colon cancer Neg Hx     Social History   Tobacco Use  . Smoking status: Never  .  Smokeless tobacco: Never  Vaping Use  . Vaping status: Never Used  Substance Use Topics  . Alcohol use: Never  . Drug use: Never     Allergies  Allergen Reactions  . Penicillins Other (See Comments)    "lose my hearing"    Health Maintenance  Topic Date Due  . FOOT EXAM  05/09/2023  . HEMOGLOBIN A1C  05/17/2023  . Pneumonia Vaccine 21+ Years old (1 of 2 - PCV) 06/11/2024 (Originally 04/19/1974)  . OPHTHALMOLOGY EXAM  07/04/2023  . Diabetic kidney evaluation - eGFR measurement  11/17/2023  . Diabetic kidney evaluation - Urine ACR  11/27/2023  . Medicare Annual Wellness (AWV)  01/12/2024  . MAMMOGRAM  03/11/2024  . DEXA SCAN  02/07/2025  . Colonoscopy  12/07/2025  . DTaP/Tdap/Td (2 - Td or Tdap) 08/21/2028  . Hepatitis C Screening  Completed  . HPV VACCINES  Aged Out  .  Meningococcal B Vaccine  Aged Out  . INFLUENZA VACCINE  Discontinued  . COVID-19 Vaccine  Discontinued  . Zoster Vaccines- Shingrix  Discontinued    Chart Review Today: ***  Review of Systems   Objective:   Vitals:   06/12/23 1440  BP: 126/80  Pulse: 82  Resp: 16  SpO2: 96%  Weight: 253 lb (114.8 kg)  Height: 5\' 2"  (1.575 m)    Body mass index is 46.27 kg/m.  Physical Exam   Functional Status Survey:   Results for orders placed or performed in visit on 11/24/22  Microalbumin / creatinine urine ratio   Collection Time: 11/27/22 11:46 AM  Result Value Ref Range   Creatinine, Urine 46.2 Not Estab. mg/dL   Microalbumin, Urine 4.1 Not Estab. ug/mL   Microalb/Creat Ratio 9 0 - 29 mg/g creat      Assessment & Plan:   Type 2 diabetes mellitus without complication, without long-term current use of insulin (HCC)  Dyslipidemia  Essential hypertension     No follow-ups on file.   Adeline Hone, PA-C 06/12/23 2:50 PM

## 2023-06-12 NOTE — Patient Instructions (Addendum)
 [address]  [place] [address]  [phone]  [facility]   [email or web address]   Let me know if you want ortho to evaluate you for your knees

## 2023-06-13 ENCOUNTER — Encounter: Payer: Self-pay | Admitting: Family Medicine

## 2023-06-13 ENCOUNTER — Ambulatory Visit: Payer: Self-pay | Admitting: Family Medicine

## 2023-06-13 LAB — COMPREHENSIVE METABOLIC PANEL WITH GFR
ALT: 30 IU/L (ref 0–32)
AST: 24 IU/L (ref 0–40)
Albumin: 4.6 g/dL (ref 3.9–4.9)
Alkaline Phosphatase: 127 IU/L — ABNORMAL HIGH (ref 44–121)
BUN/Creatinine Ratio: 27 (ref 12–28)
BUN: 17 mg/dL (ref 8–27)
Bilirubin Total: 1.1 mg/dL (ref 0.0–1.2)
CO2: 23 mmol/L (ref 20–29)
Calcium: 9.4 mg/dL (ref 8.7–10.3)
Chloride: 105 mmol/L (ref 96–106)
Creatinine, Ser: 0.63 mg/dL (ref 0.57–1.00)
Globulin, Total: 2.4 g/dL (ref 1.5–4.5)
Glucose: 91 mg/dL (ref 70–99)
Potassium: 4.4 mmol/L (ref 3.5–5.2)
Sodium: 144 mmol/L (ref 134–144)
Total Protein: 7 g/dL (ref 6.0–8.5)
eGFR: 98 mL/min/{1.73_m2} (ref >59)

## 2023-06-13 LAB — HEMOGLOBIN A1C
Est. average glucose Bld gHb Est-mCnc: 128 mg/dL
Hgb A1c MFr Bld: 6.1 % — ABNORMAL HIGH (ref 4.8–5.6)

## 2023-06-13 NOTE — Assessment & Plan Note (Signed)
 Weight has sig incresaed over the past 1-2 years, a few years ago she was losing weight with diet and exercise efforts Associated comorbidities include DM, HLD, OA severe and worsening  Wt Readings from Last 5 Encounters:  06/12/23 253 lb (114.8 kg)  01/12/23 239 lb (108.4 kg)  11/24/22 221 lb (100.2 kg)  05/23/22 215 lb 14.4 oz (97.9 kg)  05/10/22 213 lb 4.8 oz (96.8 kg)   BMI Readings from Last 5 Encounters:  06/12/23 46.27 kg/m  01/12/23 43.71 kg/m  11/24/22 40.42 kg/m  05/23/22 39.49 kg/m  05/10/22 39.01 kg/m

## 2023-06-13 NOTE — Assessment & Plan Note (Signed)
 Good statin compliance w/o SE or concerns Last lipid panel reviewed and at goal w/o recent changes Lipid panel annually  Lab Results  Component Value Date   CHOL 163 11/17/2022   HDL 88 11/17/2022   LDLCALC 61 11/17/2022   TRIG 73 11/17/2022   CHOLHDL 1.9 11/17/2022

## 2023-06-13 NOTE — Assessment & Plan Note (Signed)
 BP at goal today on current meds Good compliance no SE or concerns BP Readings from Last 3 Encounters:  06/12/23 126/80  01/12/23 126/68  11/24/22 136/74

## 2023-06-13 NOTE — Assessment & Plan Note (Signed)
 DM:  well controlled, stable Pt managing DM with metformin  500 mg once daily Good med compliance Denies: Polyuria, polydipsia, vision changes, neuropathy, hypoglycemia Recent pertinent labs: Lab Results  Component Value Date   HGBA1C 6.1 (H) 06/12/2023   HGBA1C 5.7 (H) 11/17/2022   HGBA1C 5.7 (H) 05/01/2022    Standard of care and health maintenance: Urine Microalbumin:  utd Foot exam:  yes DM eye exam:  yes ACEI/ARB:  no Statin:  yes

## 2023-06-25 ENCOUNTER — Encounter: Payer: Self-pay | Admitting: Family Medicine

## 2023-06-25 ENCOUNTER — Telehealth: Payer: Medicare (Managed Care) | Admitting: Family Medicine

## 2023-06-25 DIAGNOSIS — Z7984 Long term (current) use of oral hypoglycemic drugs: Secondary | ICD-10-CM

## 2023-06-25 DIAGNOSIS — E119 Type 2 diabetes mellitus without complications: Secondary | ICD-10-CM

## 2023-06-25 MED ORDER — OZEMPIC (0.25 OR 0.5 MG/DOSE) 2 MG/3ML ~~LOC~~ SOPN: PEN_INJECTOR | SUBCUTANEOUS | 0 refills | Status: DC

## 2023-06-25 MED ORDER — OZEMPIC (0.25 OR 0.5 MG/DOSE) 2 MG/3ML ~~LOC~~ SOPN: PEN_INJECTOR | SUBCUTANEOUS | 2 refills | Status: DC

## 2023-06-25 NOTE — Progress Notes (Signed)
 Name: Tina Stephenson   MRN: [id number]    DOB: 10/13/1955   Date:06/25/2023       Progress Note  Subjective:    Chief Complaint  Chief Complaint  Patient presents with   Diabetes    Discuss ozempic     I connected with  Jorge Newcomer  on 06/25/23 at 11:00 AM EDT by a video enabled telemedicine application and verified that I am speaking with the correct person using two identifiers.  I discussed the limitations of evaluation and management by telemedicine and the availability of in person appointments. The patient expressed understanding and agreed to proceed. Staff also discussed with the patient that there may be a patient responsible charge related to this service. Patient Location: home Provider Location: [facility] clinic Additional Individuals present: none  HPI Highest A1c 6.9 in 2021 Interested in adding on ozempic for other health benefits She has been on metformin  with GI SE, cannot tolerate higher doses No hx of pancreatitis, MTC, MEN type 2  Lab Results  Component Value Date   HGBA1C 6.1 (H) 06/12/2023   HGBA1C 5.7 (H) 11/17/2022   HGBA1C 5.7 (H) 05/01/2022   HGBA1C 5.6 08/12/2021   HGBA1C 5.7 (H) 05/06/2021   HGBA1C 6.4 (H) 09/24/2020   HGBA1C 5.9 (H) 05/07/2020   HGBA1C 6.5 (H) 01/05/2020   HGBA1C 6.9 (H) 10/01/2019   HGBA1C 6.3 (A) 08/22/2018         Patient Active Problem List   Diagnosis Date Noted   Chronic pain of both knees 11/24/2022   Low back pain 11/07/2021   Obesity, unspecified 11/07/2021   Leg swelling 08/02/2020   Dyslipidemia 01/05/2020   Type 2 diabetes mellitus without complication, without long-term current use of insulin (HCC) 08/22/2018   Scoliosis 03/25/2018   Onychomycosis 02/21/2018   Vitamin D  deficiency 03/19/2015   Essential hypertension 03/05/2015    Social History   Tobacco Use   Smoking status: Never   Smokeless tobacco: Never  Substance Use Topics   Alcohol use: Never     Current Outpatient Medications:     acetaminophen  (TYLENOL ) 500 MG tablet, Take 1,000 mg by mouth 2 (two) times daily., Disp: , Rfl:    albuterol  (PROVENTIL ) (2.5 MG/3ML) 0.083% nebulizer solution, Take 3 mLs (2.5 mg total) by nebulization every 6 (six) hours as needed for wheezing or shortness of breath., Disp: 150 mL, Rfl: 3   blood glucose meter kit and supplies KIT, Dispense based on patient and insurance preference. Use once daily as directed. (FOR ICD-10 E11.9)., Disp: 1 each, Rfl: 0   glucose blood test strip, Use as instructed, Disp: 100 each, Rfl: 1   Lancets (ONETOUCH DELICA PLUS LANCET33G) MISC, USE TO CHECK BLOOD SUGAR TWICE DAILY, Disp: 100 each, Rfl: 5   meloxicam  (MOBIC ) 7.5 MG tablet, Take 1-2 tablets (7.5-15 mg total) by mouth daily as needed for pain (joint pain)., Disp: 60 tablet, Rfl: 2   metFORMIN  (GLUCOPHAGE ) 500 MG tablet, TAKE 1 TABLET(500 MG) BY MOUTH DAILY WITH BREAKFAST, Disp: 90 tablet, Rfl: 0   Multiple Vitamins-Minerals (MULTIVITAMIN WITH MINERALS) tablet, Take 1 tablet by mouth daily., Disp: , Rfl:    ondansetron  (ZOFRAN ) 4 MG tablet, Take 1 tablet (4 mg total) by mouth every 8 (eight) hours as needed for nausea or vomiting., Disp: 20 tablet, Rfl: 0   rosuvastatin  (CRESTOR ) 5 MG tablet, TAKE 1 TABLET(5 MG) BY MOUTH DAILY, Disp: 90 tablet, Rfl: 1   SUMAtriptan  (IMITREX ) 25 MG tablet, Take 25-100 mg po at  onset of headache. May repeat in 2 hours if headache persists or recurs. max 200 mg in 24 hours, Disp: 10 tablet, Rfl: 11   Semaglutide,0.25 or 0.5MG /DOS, (OZEMPIC, 0.25 OR 0.5 MG/DOSE,) 2 MG/3ML SOPN, Inject 0.25 mg into the skin once a week for 28 days, THEN 0.5 mg once a week., Disp: 9 mL, Rfl: 0  Allergies  Allergen Reactions   Penicillins Other (See Comments)    lose my hearing    I personally reviewed active problem list, medication list, allergies, family history, social history, health maintenance, notes from last encounter, lab results, imaging with the patient/caregiver today.   Review of  Systems    Objective:   Virtual encounter, vitals limited, only able to obtain the following There were no vitals filed for this visit. There is no height or weight on file to calculate BMI. Nursing Note and Vital Signs reviewed.  Physical Exam Vitals and nursing note reviewed.  Constitutional:      Appearance: She is obese.  Pulmonary:     Effort: No respiratory distress.   Neurological:     Mental Status: She is alert.   Psychiatric:        Mood and Affect: Mood normal.        Behavior: Behavior normal.     PE limited by virtual encounter  No results found for this or any previous visit (from the past 72 hours).  Assessment and Plan:   Type 2 diabetes mellitus without complication, without long-term current use of insulin (HCC) Assessment & Plan: T2dm highest A1c 6.9 GI SE with metformin  Would like to try getting ozempic for DM management and other benefits/hopefully weight loss SE She has GI SE with metformin  has only been able to tolerate lower dose with some GI SE/sx No contraindications to GLP-1 Lab Results  Component Value Date   HGBA1C 6.1 (H) 06/12/2023   HGBA1C 5.7 (H) 11/17/2022   HGBA1C 5.7 (H) 05/01/2022   HGBA1C 5.6 08/12/2021   HGBA1C 5.7 (H) 05/06/2021   HGBA1C 6.4 (H) 09/24/2020   HGBA1C 5.9 (H) 05/07/2020   HGBA1C 6.5 (H) 01/05/2020   HGBA1C 6.9 (H) 10/01/2019   HGBA1C 6.3 (A) 08/22/2018   If insurance approved ozempic pt will do 0.25 mg dose x 4 then increase to 0.50 mg dose weekly for 6-10 weeks and we will do close f/up during this time to monitor tolerance and adjust doses   Orders: -     Ozempic (0.25 or 0.5 MG/DOSE); Inject 0.25 mg into the skin once a week for 28 days, THEN 0.5 mg once a week.  Dispense: 9 mL; Refill: 0     Reviewed potential SE with medications and how to manage/monitor including when to reduce doses if SE are severe and persistent Expect insurance to need to do PA first - anticipate this taking up to 7 d If  approved we will do virtual f/up in 8-12 weeks Her next in office visit can still be 6 months from last    - I discussed the assessment and treatment plan with the patient. The patient was provided an opportunity to ask questions and all were answered. The patient agreed with the plan and demonstrated an understanding of the instructions.  I provided 22+ minutes of non-face-to-face time during this encounter.  Adeline Hone, PA-C 06/25/23 11:27 AM

## 2023-06-25 NOTE — Assessment & Plan Note (Signed)
 T2dm highest A1c 6.9 GI SE with metformin  Would like to try getting ozempic for DM management and other benefits/hopefully weight loss SE She has GI SE with metformin  has only been able to tolerate lower dose with some GI SE/sx No contraindications to GLP-1 Lab Results  Component Value Date   HGBA1C 6.1 (H) 06/12/2023   HGBA1C 5.7 (H) 11/17/2022   HGBA1C 5.7 (H) 05/01/2022   HGBA1C 5.6 08/12/2021   HGBA1C 5.7 (H) 05/06/2021   HGBA1C 6.4 (H) 09/24/2020   HGBA1C 5.9 (H) 05/07/2020   HGBA1C 6.5 (H) 01/05/2020   HGBA1C 6.9 (H) 10/01/2019   HGBA1C 6.3 (A) 08/22/2018   If insurance approved ozempic pt will do 0.25 mg dose x 4 then increase to 0.50 mg dose weekly for 6-10 weeks and we will do close f/up during this time to monitor tolerance and adjust doses

## 2023-06-27 ENCOUNTER — Encounter: Payer: Self-pay | Admitting: Family Medicine

## 2023-07-10 ENCOUNTER — Encounter: Payer: Self-pay | Admitting: Family Medicine

## 2023-08-20 ENCOUNTER — Other Ambulatory Visit: Payer: Self-pay | Admitting: Family Medicine

## 2023-08-20 DIAGNOSIS — E119 Type 2 diabetes mellitus without complications: Secondary | ICD-10-CM

## 2023-08-23 NOTE — Telephone Encounter (Signed)
 Requested Prescriptions  Pending Prescriptions Disp Refills   metFORMIN  (GLUCOPHAGE ) 500 MG tablet [Pharmacy Med Name: METFORMIN  500MG  TABLETS] 90 tablet 0    Sig: TAKE 1 TABLET(500 MG) BY MOUTH DAILY WITH BREAKFAST     Endocrinology:  Diabetes - Biguanides Failed - 08/23/2023  9:43 AM      Failed - B12 Level in normal range and within 720 days    No results found for: VITAMINB12       Passed - Cr in normal range and within 360 days    Creatinine, Ser  Date Value Ref Range Status  06/12/2023 0.63 0.57 - 1.00 mg/dL Final         Passed - HBA1C is between 0 and 7.9 and within 180 days    Hemoglobin A1C  Date Value Ref Range Status  08/12/2021 5.6  Final   Hgb A1c MFr Bld  Date Value Ref Range Status  06/12/2023 6.1 (H) 4.8 - 5.6 % Final    Comment:             Prediabetes: 5.7 - 6.4          Diabetes: >6.4          Glycemic control for adults with diabetes: <7.0          Passed - eGFR in normal range and within 360 days    GFR calc Af Amer  Date Value Ref Range Status  01/05/2020 102 >59 mL/min/1.73 Final    Comment:    **In accordance with recommendations from the NKF-ASN Task force,**   [facility] is in the process of updating its eGFR calculation to the   2021 CKD-EPI creatinine equation that estimates kidney function   without a race variable.    GFR calc non Af Amer  Date Value Ref Range Status  01/05/2020 89 >59 mL/min/1.73 Final   eGFR  Date Value Ref Range Status  06/12/2023 98 >59 mL/min/1.73 Final         Passed - Valid encounter within last 6 months    Recent Outpatient Visits           1 month ago Type 2 diabetes mellitus without complication, without long-term current use of insulin [facility])   [facility] Leavy Mole, PA-C   2 months ago Type 2 diabetes mellitus without complication, without long-term current use of insulin [facility])   [facility] Leavy Mole, PA-C       Future Appointments              In 5 days Tapia, Leisa, PA-C [facility]   In 2 months Hester Alm BROCKS, MD [facility]            Passed - CBC within normal limits and completed in the last 12 months    WBC  Date Value Ref Range Status  11/17/2022 6.6 3.4 - 10.8 x10E3/uL Final   RBC  Date Value Ref Range Status  11/17/2022 4.33 3.77 - 5.28 x10E6/uL Final   Hemoglobin  Date Value Ref Range Status  11/17/2022 12.6 11.1 - 15.9 g/dL Final   Hematocrit  Date Value Ref Range Status  11/17/2022 39.5 34.0 - 46.6 % Final   MCHC  Date Value Ref Range Status  11/17/2022 31.9 31.5 - 35.7 g/dL Final   [facility]  Date Value Ref Range Status  11/17/2022 29.1 26.6 - 33.0 pg Final   MCV  Date Value Ref  Range Status  11/17/2022 91 79 - 97 fL Final   No results found for: PLTCOUNTKUC, LABPLAT, POCPLA RDW  Date Value Ref Range Status  11/17/2022 12.1 11.7 - 15.4 % Final

## 2023-08-28 ENCOUNTER — Encounter: Payer: Self-pay | Admitting: Family Medicine

## 2023-08-28 ENCOUNTER — Ambulatory Visit: Payer: Medicare (Managed Care) | Admitting: Family Medicine

## 2023-08-28 DIAGNOSIS — E119 Type 2 diabetes mellitus without complications: Secondary | ICD-10-CM

## 2023-08-28 DIAGNOSIS — Z7985 Long-term (current) use of injectable non-insulin antidiabetic drugs: Secondary | ICD-10-CM

## 2023-08-28 LAB — LIPID PANEL
Cholesterol: 133 (ref 0–200)
HDL: 58 (ref 35–70)
LDL Cholesterol: 54
Triglycerides: 121 (ref 40–160)

## 2023-08-28 LAB — HEMOGLOBIN A1C: Hemoglobin A1C: 5.9

## 2023-08-28 LAB — BASIC METABOLIC PANEL WITH GFR: Glucose: 102

## 2023-08-28 MED ORDER — OZEMPIC (0.25 OR 0.5 MG/DOSE) 2 MG/3ML ~~LOC~~ SOPN: 0.5000 mg | PEN_INJECTOR | SUBCUTANEOUS | 0 refills | Status: DC

## 2023-08-28 NOTE — Patient Instructions (Signed)
 Let me know in about 4 weeks about what ozempic  dose you want refilled 0.50 mg dose or increase to 1.0 mg dose  And I will send in your refill for 3 months

## 2023-08-28 NOTE — Progress Notes (Signed)
 Patient ID: Jobeth Pangilinan, female    DOB: 1955/10/17, 68 y.o.   MRN: [id number]  PCP: Leavy Mole, PA-C  Chief Complaint  Patient presents with   Follow-up    Ozempic - a little nausea, about 3 days after injection and lasts for 1 day.   Diabetes    Subjective:   Shanvi Moyd is a 68 y.o. female, presents to clinic with CC of the following:  HPI  Here for f/up -Newly on ozempic  currently on 0.50 mg dose  she would like to stay on same Started with 0.25 mg dose x 4 weeks then increased to 0.5 mg dose has done 4 doses, has 2 doses left Wt Readings from Last 5 Encounters:  08/28/23 250 lb (113.4 kg)  06/12/23 253 lb (114.8 kg)  01/12/23 239 lb (108.4 kg)  11/24/22 221 lb (100.2 kg)  05/23/22 215 lb 14.4 oz (97.9 kg)   BMI Readings from Last 5 Encounters:  08/28/23 45.73 kg/m  06/12/23 46.27 kg/m  01/12/23 43.71 kg/m  11/24/22 40.42 kg/m  05/23/22 39.49 kg/m   Sugars 100-130's Did labs this am -  Lab Results  Component Value Date   HGBA1C 6.1 (H) 06/12/2023   Discussed the use of AI scribe software for clinical note transcription with the patient, who gave verbal consent to proceed.  History of Present Illness Ravin Bendall is a 68 year old female with diabetes who presents for medication management and follow-up.  Glycemic control and antihyperglycemic therapy - Type 2 diabetes mellitus managed with Ozempic  (semaglutide ) - Initiated Ozempic  at 0.25 mg, currently at 0.5 mg weekly, administered on Mondays - Fasting blood glucose measured at 131 mg/dL this morning - Previous hemoglobin A1c was well controlled; current laboratory results pending - Decreased appetite since starting Ozempic  - Weight loss of approximately seven pounds since initiation of therapy (our VS here reflect only 3lbs weight loss)  Gastrointestinal adverse effects - Nausea occurring on the third day after Ozempic  administration - Nausea now lasts only a few hours and has improved in  duration  Medication access and adherence challenges - Insurance limitations restrict supply to one Ozempic  pen at a time - Difficulty maintaining consistent dosing schedule due to medication supply constraints    Patient Active Problem List   Diagnosis Date Noted   Chronic pain of both knees 11/24/2022   Low back pain 11/07/2021   Obesity, unspecified 11/07/2021   Leg swelling 08/02/2020   Dyslipidemia 01/05/2020   Type 2 diabetes mellitus without complication, without long-term current use of insulin (HCC) 08/22/2018   Scoliosis 03/25/2018   Onychomycosis 02/21/2018   Vitamin D  deficiency 03/19/2015   Essential hypertension 03/05/2015      Current Outpatient Medications:    acetaminophen  (TYLENOL ) 500 MG tablet, Take 1,000 mg by mouth 2 (two) times daily., Disp: , Rfl:    albuterol  (PROVENTIL ) (2.5 MG/3ML) 0.083% nebulizer solution, Take 3 mLs (2.5 mg total) by nebulization every 6 (six) hours as needed for wheezing or shortness of breath., Disp: 150 mL, Rfl: 3   blood glucose meter kit and supplies KIT, Dispense based on patient and insurance preference. Use once daily as directed. (FOR ICD-10 E11.9)., Disp: 1 each, Rfl: 0   glucose blood test strip, Use as instructed, Disp: 100 each, Rfl: 1   Lancets (ONETOUCH DELICA PLUS LANCET33G) MISC, USE TO CHECK BLOOD SUGAR TWICE DAILY, Disp: 100 each, Rfl: 5   meloxicam  (MOBIC ) 7.5 MG tablet, Take 1-2 tablets (7.5-15 mg total) by mouth daily as  needed for pain (joint pain)., Disp: 60 tablet, Rfl: 2   metFORMIN  (GLUCOPHAGE ) 500 MG tablet, TAKE 1 TABLET(500 MG) BY MOUTH DAILY WITH BREAKFAST, Disp: 90 tablet, Rfl: 0   Multiple Vitamins-Minerals (MULTIVITAMIN WITH MINERALS) tablet, Take 1 tablet by mouth daily., Disp: , Rfl:    ondansetron  (ZOFRAN ) 4 MG tablet, Take 1 tablet (4 mg total) by mouth every 8 (eight) hours as needed for nausea or vomiting., Disp: 20 tablet, Rfl: 0   rosuvastatin  (CRESTOR ) 5 MG tablet, TAKE 1 TABLET(5 MG) BY MOUTH  DAILY, Disp: 90 tablet, Rfl: 1   Semaglutide ,0.25 or 0.5MG /DOS, (OZEMPIC , 0.25 OR 0.5 MG/DOSE,) 2 MG/3ML SOPN, Inject 0.25 mg into the skin once a week for 28 days, THEN 0.5 mg once a week., Disp: 9 mL, Rfl: 0   SUMAtriptan  (IMITREX ) 25 MG tablet, Take 25-100 mg po at onset of headache. May repeat in 2 hours if headache persists or recurs. max 200 mg in 24 hours, Disp: 10 tablet, Rfl: 11   Allergies  Allergen Reactions   Penicillins Other (See Comments)    lose my hearing     Social History   Tobacco Use   Smoking status: Never   Smokeless tobacco: Never  Vaping Use   Vaping status: Never Used  Substance Use Topics   Alcohol use: Never   Drug use: Never      Chart Review Today:   Review of Systems  Constitutional: Negative.   HENT: Negative.    Eyes: Negative.   Respiratory: Negative.    Cardiovascular: Negative.   Gastrointestinal: Negative.   Endocrine: Negative.   Genitourinary: Negative.   Musculoskeletal: Negative.   Skin: Negative.   Allergic/Immunologic: Negative.   Neurological: Negative.   Hematological: Negative.   Psychiatric/Behavioral: Negative.    All other systems reviewed and are negative.      Objective:   Vitals:   08/28/23 1522  BP: 132/76  Pulse: 89  Resp: 16  SpO2: 96%  Weight: 250 lb (113.4 kg)  Height: 5' 2 (1.575 m)    Body mass index is 45.73 kg/m.  Physical Exam Vitals and nursing note reviewed.  Constitutional:      General: She is not in acute distress.    Appearance: Normal appearance. She is well-developed. She is obese. She is not ill-appearing, toxic-appearing or diaphoretic.  HENT:     Head: Normocephalic and atraumatic.     Right Ear: External ear normal.     Left Ear: External ear normal.     Nose: Nose normal.  Eyes:     General: No scleral icterus.       Right eye: No discharge.        Left eye: No discharge.     Conjunctiva/sclera: Conjunctivae normal.  Neck:     Trachea: No tracheal deviation.   Cardiovascular:     Rate and Rhythm: Normal rate.  Pulmonary:     Effort: Pulmonary effort is normal. No respiratory distress.     Breath sounds: No stridor.  Skin:    General: Skin is warm and dry.     Findings: No rash.  Neurological:     Mental Status: She is alert.     Motor: No abnormal muscle tone.     Coordination: Coordination normal.     Gait: Gait normal.  Psychiatric:        Mood and Affect: Mood normal.        Behavior: Behavior normal.      Results for orders  placed or performed in visit on 06/12/23  HgB A1c   Collection Time: 06/12/23  3:42 PM  Result Value Ref Range   Hgb A1c MFr Bld 6.1 (H) 4.8 - 5.6 %   Est. average glucose Bld gHb Est-mCnc 128 mg/dL  Comprehensive Metabolic Panel (CMET)   Collection Time: 06/12/23  3:42 PM  Result Value Ref Range   Glucose 91 70 - 99 mg/dL   BUN 17 8 - 27 mg/dL   Creatinine, Ser 9.36 0.57 - 1.00 mg/dL   eGFR 98 >40 fO/fpw/8.26   BUN/Creatinine Ratio 27 12 - 28   Sodium 144 134 - 144 mmol/L   Potassium 4.4 3.5 - 5.2 mmol/L   Chloride 105 96 - 106 mmol/L   CO2 23 20 - 29 mmol/L   Calcium  9.4 8.7 - 10.3 mg/dL   Total Protein 7.0 6.0 - 8.5 g/dL   Albumin 4.6 3.9 - 4.9 g/dL   Globulin, Total 2.4 1.5 - 4.5 g/dL   Bilirubin Total 1.1 0.0 - 1.2 mg/dL   Alkaline Phosphatase 127 (H) 44 - 121 IU/L   AST 24 0 - 40 IU/L   ALT 30 0 - 32 IU/L       Assessment & Plan:   1. Type 2 diabetes mellitus without complication, without long-term current use of insulin (HCC) Continue 0.50 mg weekly dose, has 2 more doses and I put one more pen (4 more doses) asked her to f/up through mychart in ~4-5 weeks about what dose she wants refilled - Semaglutide ,0.25 or 0.5MG /DOS, (OZEMPIC , 0.25 OR 0.5 MG/DOSE,) 2 MG/3ML SOPN; Inject 0.5 mg into the skin once a week for 28 days.  Dispense: 3 mL; Refill: 0  Inquire with pharmacy about if they will cover only 1 pen at a time or is there a way to get 3 month supply covered and  dispensed?  Overall DM well controlled, continue metformin , continue ozempic  and diet/lifestyle efforts 6 month f/up  Assessment and Plan Assessment & Plan Type 2 diabetes mellitus without complications Type 2 diabetes is well-controlled. Blood sugar was 131 mg/dL fasting. Mild nausea post-semaglutide  resolves quickly. Current semaglutide  dose not yet therapeutic for full benefits. Slow weight loss preferred. - Continue semaglutide  0.5 mg subcutaneously weekly. - Send prescription for one more pen of semaglutide  0.5 mg. - Consider increasing to semaglutide  1 mg in 4-6 weeks if current dose less effective or side effects tolerable. - Monitor blood sugar levels and report significant changes. - Send message in 4-5 weeks to discuss potential dose increase.  Recording duration: 10 minutes      Michelene Cower, PA-C 08/28/23 3:28 PM

## 2023-08-29 ENCOUNTER — Encounter: Payer: Self-pay | Admitting: Family Medicine

## 2023-09-06 ENCOUNTER — Other Ambulatory Visit: Payer: Self-pay | Admitting: Family Medicine

## 2023-09-06 DIAGNOSIS — G8929 Other chronic pain: Secondary | ICD-10-CM

## 2023-09-07 NOTE — Telephone Encounter (Signed)
 Requested Prescriptions  Pending Prescriptions Disp Refills   meloxicam  (MOBIC ) 7.5 MG tablet [Pharmacy Med Name: MELOXICAM  7.5MG  TABLETS] 60 tablet 2    Sig: TAKE 1 TO 2 TABLETS(7.5 TO 15 MG) BY MOUTH DAILY AS NEEDED FOR PAIN OR JOINT PAIN     Analgesics:  COX2 Inhibitors Failed - 09/07/2023  2:20 PM      Failed - Manual Review: Labs are only required if the patient has taken medication for more than 8 weeks.      Passed - HGB in normal range and within 360 days    Hemoglobin  Date Value Ref Range Status  11/17/2022 12.6 11.1 - 15.9 g/dL Final         Passed - Cr in normal range and within 360 days    Creatinine, Ser  Date Value Ref Range Status  06/12/2023 0.63 0.57 - 1.00 mg/dL Final         Passed - HCT in normal range and within 360 days    Hematocrit  Date Value Ref Range Status  11/17/2022 39.5 34.0 - 46.6 % Final         Passed - AST in normal range and within 360 days    AST  Date Value Ref Range Status  06/12/2023 24 0 - 40 IU/L Final         Passed - ALT in normal range and within 360 days    ALT  Date Value Ref Range Status  06/12/2023 30 0 - 32 IU/L Final         Passed - eGFR is 30 or above and within 360 days    GFR calc Af Amer  Date Value Ref Range Status  01/05/2020 102 >59 mL/min/1.73 Final    Comment:    **In accordance with recommendations from the NKF-ASN Task force,**   [facility] is in the process of updating its eGFR calculation to the   2021 CKD-EPI creatinine equation that estimates kidney function   without a race variable.    GFR calc non Af Amer  Date Value Ref Range Status  01/05/2020 89 >59 mL/min/1.73 Final   eGFR  Date Value Ref Range Status  06/12/2023 98 >59 mL/min/1.73 Final         Passed - Patient is not pregnant      Passed - Valid encounter within last 12 months    Recent Outpatient Visits           1 week ago Type 2 diabetes mellitus without complication, without long-term current use of insulin [facility])   [facility] Leavy Mole, PA-C   2 months ago Type 2 diabetes mellitus without complication, without long-term current use of insulin [facility])   [facility] Leavy Mole, PA-C   2 months ago Type 2 diabetes mellitus without complication, without long-term current use of insulin [facility])    [facility] Leavy Mole, PA-C       Future Appointments             In 1 month Hester Alm BROCKS, MD [facility]   In 5 months Leavy Mole, PA-C [facility] [place]

## 2023-10-01 ENCOUNTER — Encounter: Payer: Self-pay | Admitting: Family Medicine

## 2023-10-01 NOTE — Addendum Note (Signed)
 Addended by: Charla Criscione on: 10/01/2023 07:29 PM   Modules accepted: Orders

## 2023-10-04 ENCOUNTER — Ambulatory Visit: Payer: Medicare (Managed Care) | Admitting: Dermatology

## 2023-10-23 ENCOUNTER — Ambulatory Visit: Payer: Medicare (Managed Care) | Admitting: Dermatology

## 2023-10-23 ENCOUNTER — Encounter: Payer: Self-pay | Admitting: Dermatology

## 2023-10-23 DIAGNOSIS — Z86018 Personal history of other benign neoplasm: Secondary | ICD-10-CM | POA: Diagnosis not present

## 2023-10-23 DIAGNOSIS — L578 Other skin changes due to chronic exposure to nonionizing radiation: Secondary | ICD-10-CM | POA: Diagnosis not present

## 2023-10-23 DIAGNOSIS — W908XXA Exposure to other nonionizing radiation, initial encounter: Secondary | ICD-10-CM

## 2023-10-23 DIAGNOSIS — L821 Other seborrheic keratosis: Secondary | ICD-10-CM

## 2023-10-23 DIAGNOSIS — Z1283 Encounter for screening for malignant neoplasm of skin: Secondary | ICD-10-CM

## 2023-10-23 DIAGNOSIS — D492 Neoplasm of unspecified behavior of bone, soft tissue, and skin: Secondary | ICD-10-CM

## 2023-10-23 DIAGNOSIS — L820 Inflamed seborrheic keratosis: Secondary | ICD-10-CM

## 2023-10-23 DIAGNOSIS — D1801 Hemangioma of skin and subcutaneous tissue: Secondary | ICD-10-CM

## 2023-10-23 DIAGNOSIS — D229 Melanocytic nevi, unspecified: Secondary | ICD-10-CM

## 2023-10-23 DIAGNOSIS — L814 Other melanin hyperpigmentation: Secondary | ICD-10-CM

## 2023-10-23 DIAGNOSIS — D485 Neoplasm of uncertain behavior of skin: Secondary | ICD-10-CM | POA: Diagnosis not present

## 2023-10-23 NOTE — Patient Instructions (Addendum)

## 2023-10-23 NOTE — Progress Notes (Signed)
 Follow-Up Visit   Subjective  Tina Stephenson is a 68 y.o. female who presents for the following: Skin Cancer Screening and Full Body Skin Exam hx of Dysplastic Nevi, check growth L forehead, irritating  The patient presents for Total-Body Skin Exam (TBSE) for skin cancer screening and mole check. The patient has spots, moles and lesions to be evaluated, some may be new or changing and the patient may have concern these could be cancer.  The following portions of the chart were reviewed this encounter and updated as appropriate: medications, allergies, medical history  Review of Systems:  No other skin or systemic complaints except as noted in HPI or Assessment and Plan.  Objective  Well appearing patient in no apparent distress; mood and affect are within normal limits.  A full examination was performed including scalp, head, eyes, ears, nose, lips, neck, chest, axillae, abdomen, back, buttocks, bilateral upper extremities, bilateral lower extremities, hands, feet, fingers, toes, fingernails, and toenails. All findings within normal limits unless otherwise noted below.   Relevant physical exam findings are noted in the Assessment and Plan.  L forehead x 1, R temple x 1 (2) Stuck on waxy paps with erythema L sup lat buttocks - Black macule - R/O dysplastic nevus vs Hemangioma 0.4cm black macule   Assessment & Plan   SKIN CANCER SCREENING PERFORMED TODAY.  ACTINIC DAMAGE - Chronic condition, secondary to cumulative UV/sun exposure - diffuse scaly erythematous macules with underlying dyspigmentation - Recommend daily broad spectrum sunscreen SPF 30+ to sun-exposed areas, reapply every 2 hours as needed.  - Staying in the shade or wearing long sleeves, sun glasses (UVA+UVB protection) and wide brim hats (4-inch brim around the entire circumference of the hat) are also recommended for sun protection.  - Call for new or changing lesions.  LENTIGINES, SEBORRHEIC KERATOSES, HEMANGIOMAS -  Benign normal skin lesions - Benign-appearing - Call for any changes  MELANOCYTIC NEVI - Tan-brown and/or pink-flesh-colored symmetric macules and papules - Benign appearing on exam today - Observation - Call clinic for new or changing moles - Recommend daily use of broad spectrum spf 30+ sunscreen to sun-exposed areas.   HISTORY OF DYSPLASTIC NEVUS No evidence of recurrence today Recommend regular full body skin exams Recommend daily broad spectrum sunscreen SPF 30+ to sun-exposed areas, reapply every 2 hours as needed.  Call if any new or changing lesions are noted between office visits  - L medial mid scapula, L middle to upper back spinal  INFLAMED SEBORRHEIC KERATOSIS (2) L forehead x 1, R temple x 1 (2) Symptomatic, irritating, patient would like treated. Destruction of lesion - L forehead x 1, R temple x 1 (2) Complexity: simple   Destruction method: cryotherapy   Informed consent: discussed and consent obtained   Timeout:  patient name, date of birth, surgical site, and procedure verified Lesion destroyed using liquid nitrogen: Yes   Region frozen until ice ball extended beyond lesion: Yes   Outcome: patient tolerated procedure well with no complications   Post-procedure details: wound care instructions given    NEOPLASM OF SKIN L sup lat buttocks - Black macule - R/O dysplastic nevus vs Hemangioma Epidermal / dermal shaving  Lesion diameter (cm):  0.4 Informed consent: discussed and consent obtained   Timeout: patient name, date of birth, surgical site, and procedure verified   Procedure prep:  Patient was prepped and draped in usual sterile fashion Prep type:  Isopropyl alcohol Anesthesia: the lesion was anesthetized in a standard fashion  Anesthetic:  1% lidocaine  w/ epinephrine 1-100,000 buffered w/ 8.4% NaHCO3 Instrument used: flexible razor blade   Hemostasis achieved with: pressure, aluminum chloride and electrodesiccation   Outcome: patient tolerated  procedure well   Post-procedure details: sterile dressing applied and wound care instructions given   Dressing type: bandage (mupirocin)    Related Procedures Anatomic Pathology Report Return in about 1 year (around 10/22/2024) for TBSE, Hx of Dysplastic nevi.  I, Grayce Saunas, RMA, am acting as scribe for Alm Rhyme, MD .   Documentation: I have reviewed the above documentation for accuracy and completeness, and I agree with the above.  Alm Rhyme, MD

## 2023-10-31 ENCOUNTER — Ambulatory Visit: Payer: Self-pay | Admitting: Dermatology

## 2023-10-31 LAB — ANATOMIC PATHOLOGY REPORT

## 2023-11-01 NOTE — Telephone Encounter (Addendum)
 Called and discussed with patient. Patient advised to watch area and to call if she notices anything coming back in area will recheck and may need rebiopsy in future.   Patient verbalized understanding and denied further questions at this time.  ----- Message from Alm Rhyme sent at 10/31/2023  5:31 PM EDT ----- Diagnosis synopsis: Comment  Comment: Part 1-Left Superior Lateral Buttocks ,Skin Biopsy: TISSUE DID NOT SURVIVE PROCESSING.    Tissue did not survive processing. If any recurrence of growth in the area will re-biopsy in future. We will re-check area at next visit If anything detected at area before next visit, contact office for an appt to re-examine ----- Message ----- From: Interface, [facility] Lab Results In Sent: 10/31/2023   2:35 PM EDT To: Alm JAYSON Rhyme, MD

## 2023-11-08 ENCOUNTER — Encounter: Payer: Self-pay | Admitting: Family Medicine

## 2023-11-09 NOTE — Progress Notes (Signed)
 Tina Stephenson                                          MRN: [id number]   11/09/2023   The [facility]BC[facility] Quality Team Specialist reviewed this patient medical record for the purposes of chart review for care gap closure. The following were reviewed: chart review for care gap closure-kidney health evaluation for diabetes:eGFR  and uACR.    VBCI Quality Team

## 2023-11-18 ENCOUNTER — Other Ambulatory Visit: Payer: Self-pay | Admitting: Family Medicine

## 2023-11-18 DIAGNOSIS — E119 Type 2 diabetes mellitus without complications: Secondary | ICD-10-CM

## 2023-11-19 NOTE — Telephone Encounter (Signed)
 Requested medications are due for refill today.  yes  Requested medications are on the active medications list.  yes  Last refill. 08/23/2023 #90 0 rf  Future visit scheduled.   yes  Notes to clinic.  Labs are expired    Requested Prescriptions  Pending Prescriptions Disp Refills   metFORMIN  (GLUCOPHAGE ) 500 MG tablet [Pharmacy Med Name: METFORMIN  500MG  TABLETS] 90 tablet 0    Sig: TAKE 1 TABLET(500 MG) BY MOUTH DAILY WITH BREAKFAST     Endocrinology:  Diabetes - Biguanides Failed - 11/19/2023  5:35 PM      Failed - B12 Level in normal range and within 720 days    No results found for: VITAMINB12       Failed - CBC within normal limits and completed in the last 12 months    WBC  Date Value Ref Range Status  11/17/2022 6.6 3.4 - 10.8 x10E3/uL Final   RBC  Date Value Ref Range Status  11/17/2022 4.33 3.77 - 5.28 x10E6/uL Final   Hemoglobin  Date Value Ref Range Status  11/17/2022 12.6 11.1 - 15.9 g/dL Final   Hematocrit  Date Value Ref Range Status  11/17/2022 39.5 34.0 - 46.6 % Final   MCHC  Date Value Ref Range Status  11/17/2022 31.9 31.5 - 35.7 g/dL Final   [facility]  Date Value Ref Range Status  11/17/2022 29.1 26.6 - 33.0 pg Final   MCV  Date Value Ref Range Status  11/17/2022 91 79 - 97 fL Final   No results found for: PLTCOUNTKUC, LABPLAT, POCPLA RDW  Date Value Ref Range Status  11/17/2022 12.1 11.7 - 15.4 % Final         Passed - Cr in normal range and within 360 days    Creatinine, Ser  Date Value Ref Range Status  06/12/2023 0.63 0.57 - 1.00 mg/dL Final         Passed - HBA1C is between 0 and 7.9 and within 180 days    Hemoglobin A1C  Date Value Ref Range Status  08/28/2023 5.9  Final         Passed - eGFR in normal range and within 360 days    GFR calc Af Amer  Date Value Ref Range Status  01/05/2020 102 >59 mL/min/1.73 Final    Comment:    **In accordance with recommendations from the NKF-ASN Task force,**   [facility] is in the  process of updating its eGFR calculation to the   2021 CKD-EPI creatinine equation that estimates kidney function   without a race variable.    GFR calc non Af Amer  Date Value Ref Range Status  01/05/2020 89 >59 mL/min/1.73 Final   eGFR  Date Value Ref Range Status  06/12/2023 98 >59 mL/min/1.73 Final         Passed - Valid encounter within last 6 months    Recent Outpatient Visits           2 months ago Type 2 diabetes mellitus without complication, without long-term current use of insulin [facility])   [place] [facility] Leavy Mole, PA-C   4 months ago Type 2 diabetes mellitus without complication, without long-term current use of insulin [facility])   [place] [facility] Leavy Mole, PA-C   5 months ago Type 2 diabetes mellitus without complication, without long-term current use of insulin [facility])   [place] [facility] Leavy Mole, PA-C       Future Appointments  In 3 months Leavy Mole, PA-C [facility] [place]   In 11 months Hester Alm BROCKS, MD [facility]

## 2023-11-27 ENCOUNTER — Telehealth: Payer: Self-pay | Admitting: Family Medicine

## 2023-11-27 DIAGNOSIS — E785 Hyperlipidemia, unspecified: Secondary | ICD-10-CM

## 2023-11-27 MED ORDER — ROSUVASTATIN CALCIUM 5 MG PO TABS: 5.0000 mg | ORAL_TABLET | Freq: Every day | ORAL | 1 refills | Status: AC

## 2023-11-27 NOTE — Telephone Encounter (Signed)
rosuvastatin (CRESTOR) 5 MG tablet

## 2023-11-30 ENCOUNTER — Telehealth: Payer: Self-pay

## 2023-11-30 NOTE — Progress Notes (Signed)
 Pharmacy Quality Measure Review  This patient is appearing on a report for being at risk of failing the adherence measure for diabetes medications this calendar year.   Medication: Ozempic  Last fill date: 08/29/23 for 28 day supply  Medication was discontinued by PCP due to ADE on 10/01/23. No action needed at this time. Patient will fail measure.  Britain Anagnos E. Marsh, PharmD, CPP Clinical Pharmacist [facility] [phone]

## 2023-12-03 ENCOUNTER — Encounter: Payer: Self-pay | Admitting: Dermatology

## 2023-12-04 ENCOUNTER — Telehealth: Payer: Self-pay

## 2023-12-04 DIAGNOSIS — E119 Type 2 diabetes mellitus without complications: Secondary | ICD-10-CM

## 2023-12-04 NOTE — Addendum Note (Signed)
 Addended by: YVONE WARREN BROCKS on: 12/04/2023 03:01 PM   Modules accepted: Orders

## 2023-12-04 NOTE — Telephone Encounter (Signed)
 Pharmacy Quality Measure Review  This patient is appearing on a report for being at risk of failing the  Kidney Health Evaluation for Patients with Diabetes measure this calendar year.   Last documented A1c on 08/28/23  Last documented UACR on 11/27/22  No follow up appointment scheduled at this time. Will route to admin team to reschedule with PCP.  Geralynn Capri E. Marsh, PharmD, CPP Clinical Pharmacist [facility] [phone]

## 2023-12-05 ENCOUNTER — Encounter: Payer: Self-pay | Admitting: Family Medicine

## 2023-12-10 ENCOUNTER — Ambulatory Visit: Payer: Self-pay

## 2023-12-10 NOTE — Telephone Encounter (Signed)
 FYI Only or Action Required?: FYI only for provider: appointment scheduled on 12/11/23.  Patient was last seen in primary care on 08/28/2023 by Leavy Mole, PA-C.  Called Nurse Triage reporting Dizziness, Headache, and Hypertension.  Symptoms began about a month ago.  Interventions attempted: OTC medications: Tylenol  and Prescription medications: imitrex .  Symptoms are: intermittent chest pain and SOB lasted a few minutes last night and resolved now, moderate headache, intermittent dizziness resolved now, hypertension gradually worsening.  Triage Disposition: See Physician Within 24 Hours  Patient/caregiver understands and will follow disposition?: Yes          Copied from CRM #[id number]. Topic: Clinical - Red Word Triage >> Dec 10, 2023  9:23 AM Tobias L wrote: Red Word that prompted transfer to Nurse Triage: headaches and migraines, dizziness, and high BP 176/95 Reason for Disposition  [1] Chest pain lasts < 5 minutes AND [2] NO chest pain or cardiac symptoms (e.g., breathing difficulty, sweating) now  (Exception: Chest pains that last only a few seconds.)  Answer Assessment - Initial Assessment Questions 1. BLOOD PRESSURE: What is your blood pressure? Did you take at least two measurements 5 minutes apart?     176/95.  2. ONSET: When did you take your blood pressure?     This morning around 0600.   3. HOW: How did you take your blood pressure? (e.g., automatic home BP monitor, visiting nurse)     Automatic home BP monitor.  4. HISTORY: Do you have a history of high blood pressure?     Yes.  5. MEDICINES: Are you taking any medicines for blood pressure? Have you missed any doses recently?     She was on hydrochlorothiazide  but was taken off by provider (states last filled January 2024).  6. OTHER SYMPTOMS: Do you have any symptoms? (e.g., blurred vision, chest pain, difficulty breathing, headache, weakness)     6/10 headache, dizziness (not present now, not  every day, comes on random sometimes when standing, sitting or walking), nausea (not present now).  She states she has been having symptoms for about a month. Denies chest pain or difficulty breathing at this time but states last night she was having central chest pain and difficulty breathing for a couple of minutes (comes and goes). Denies unilateral numbness or weakness, changes to speech or vision.  Strict ED precautions given, patient verbalized understanding.  Protocols used: Blood Pressure - High-A-AH, Chest Pain-A-AH

## 2023-12-11 ENCOUNTER — Ambulatory Visit: Payer: Medicare (Managed Care) | Admitting: Internal Medicine

## 2023-12-11 ENCOUNTER — Encounter: Payer: Self-pay | Admitting: Internal Medicine

## 2023-12-11 ENCOUNTER — Other Ambulatory Visit: Payer: Self-pay | Admitting: Internal Medicine

## 2023-12-11 VITALS — BP 136/78 | HR 96 | Temp 97.9°F | Resp 16 | Ht 62.0 in | Wt 262.3 lb

## 2023-12-11 DIAGNOSIS — R058 Other specified cough: Secondary | ICD-10-CM | POA: Diagnosis not present

## 2023-12-11 DIAGNOSIS — G43001 Migraine without aura, not intractable, with status migrainosus: Secondary | ICD-10-CM | POA: Diagnosis not present

## 2023-12-11 DIAGNOSIS — I10 Essential (primary) hypertension: Secondary | ICD-10-CM

## 2023-12-11 DIAGNOSIS — E119 Type 2 diabetes mellitus without complications: Secondary | ICD-10-CM | POA: Diagnosis not present

## 2023-12-11 MED ORDER — KETOROLAC TROMETHAMINE 60 MG/2ML IM SOLN
60.0000 mg | Freq: Once | INTRAMUSCULAR | Status: AC
  Administered 2023-12-11: 60 mg via INTRAMUSCULAR

## 2023-12-11 MED ORDER — LISINOPRIL 20 MG PO TABS: 20.0000 mg | ORAL_TABLET | Freq: Every day | ORAL | 1 refills | Status: DC

## 2023-12-11 NOTE — Progress Notes (Signed)
 Acute Office Visit  Subjective:     Patient ID: Tina Stephenson, female    DOB: 09-20-1955, 68 y.o.   MRN: [id number]  Chief Complaint  Patient presents with   Hypertension   Migraine    Hypertension Associated symptoms include headaches. Pertinent negatives include no blurred vision, chest pain or shortness of breath.  Migraine  Pertinent negatives include no blurred vision, coughing, fever or sore throat. Her past medical history is significant for hypertension.   Patient is in today for blood pressure and headache.   Discussed the use of AI scribe software for clinical note transcription with the patient, who gave verbal consent to proceed.  History of Present Illness Tina Stephenson is a 68 year old female with hypertension and migraines who presents with elevated blood pressure and worsening headaches.  Her blood pressure has been trending up over the past few weeks, with a recent reading of 176/95 mmHg. She stopped lisinopril  and then hydrochlorothiazide  in 2024 and is currently on no blood pressure medication. She does not add salt to her food.  Her migraines worsen when her blood pressure is elevated. Current headache is in the back of her head, starts in one area and moves to another. She uses sumatriptan  25 mg, sometimes twice daily, which reduces but does not fully relieve the pain. A 100 mg dose aborts the migraine but causes nausea, so she uses anti-nausea medication with it.  She had a head cold last month and has a persistent dry cough that she feels is post-viral. She notes a similar prolonged cough in the past.  She stopped Ozempic  a month ago due to acid reflux and thyroid  discomfort. Her A1c in August was 5.9, and she continues metformin  for diabetes.   Review of Systems  Constitutional:  Negative for chills and fever.  HENT:  Negative for sinus pain and sore throat.   Eyes:  Negative for blurred vision.  Respiratory:  Negative for cough and shortness of breath.    Cardiovascular:  Negative for chest pain.  Neurological:  Positive for headaches.        Objective:    BP 136/78   Pulse 96   Temp 97.9 F (36.6 C) (Oral)   Resp 16   Ht 5' 2 (1.575 m)   Wt 262 lb 4.8 oz (119 kg)   SpO2 95%   BMI 47.98 kg/m  BP Readings from Last 3 Encounters:  12/11/23 (!) 142/86  08/28/23 132/76  06/12/23 126/80   Wt Readings from Last 3 Encounters:  12/11/23 262 lb 4.8 oz (119 kg)  08/28/23 250 lb (113.4 kg)  06/12/23 253 lb (114.8 kg)     Vitals:   12/11/23 1305 12/11/23 1337  BP: (!) 142/86 136/78     Physical Exam Constitutional:      Appearance: Normal appearance.  HENT:     Head: Normocephalic and atraumatic.     Mouth/Throat:     Mouth: Mucous membranes are moist.     Pharynx: Oropharynx is clear.  Eyes:     Extraocular Movements: Extraocular movements intact.     Conjunctiva/sclera: Conjunctivae normal.     Pupils: Pupils are equal, round, and reactive to light.  Cardiovascular:     Rate and Rhythm: Normal rate and regular rhythm.  Pulmonary:     Effort: Pulmonary effort is normal.     Breath sounds: Normal breath sounds.  Skin:    General: Skin is warm and dry.  Neurological:     General:  No focal deficit present.     Mental Status: She is alert. Mental status is at baseline.  Psychiatric:        Mood and Affect: Mood normal.        Behavior: Behavior normal.     No results found for any visits on 12/11/23.      Assessment & Plan:   Assessment & Plan Essential hypertension Blood pressure elevated at 176/95 mmHg at home, initially 142/86, came down slightly by the end of the visit but having a headache currently. Lisinopril  preferred for renal protection in diabetes. Discussed potential side effects of lisinopril . - Prescribed lisinopril  20 mg daily. - Advised monitoring blood pressure regularly. - Counseled on reducing sodium intake and maintaining hydration. - Scheduled follow-up in one month to reassess blood  pressure and headache control.  Migraine Headaches exacerbated by elevated blood pressure. Current treatment with sumatriptan  provides partial relief. - Administered Toradol  injection for acute migraine relief. - Continue sumatriptan  as needed, not exceeding 200 mg in 24 hours.  Type 2 diabetes mellitus A1c well-controlled at 5.9% as of August. Current management with metformin , stopped Ozempic  due to acid reflux symptoms.  - Continue metformin  as prescribed. - Will recheck A1c in one month to assess current control.  Post-viral cough Persistent dry cough following recent upper respiratory infection, likely post-viral. - Offered cough suppressant for symptomatic relief.  General health maintenance Tetanus vaccination up to date. No recent flu vaccination. - Encouraged flu vaccination.  - lisinopril  (ZESTRIL ) 20 MG tablet; Take 1 tablet (20 mg total) by mouth daily.  Dispense: 30 tablet; Refill: 1 - ketorolac  (TORADOL ) injection 60 mg  Return in about 4 weeks (around 01/08/2024) for can cancel February appointment .  Sharyle Fischer, DO

## 2023-12-12 ENCOUNTER — Ambulatory Visit: Payer: Self-pay | Admitting: Family Medicine

## 2023-12-12 ENCOUNTER — Ambulatory Visit: Payer: Medicare (Managed Care) | Admitting: Family Medicine

## 2023-12-12 LAB — MICROALBUMIN / CREATININE URINE RATIO
Creatinine, Urine: 11 mg/dL
Microalb/Creat Ratio: <27 mg/g{creat} (ref 0–29)
Microalbumin, Urine: <3 ug/mL

## 2023-12-14 NOTE — Telephone Encounter (Signed)
 Requested Prescriptions  Refused Prescriptions Disp Refills   lisinopril  (ZESTRIL ) 20 MG tablet [Pharmacy Med Name: LISINOPRIL  20MG  TABLETS] 90 tablet     Sig: TAKE 1 TABLET(20 MG) BY MOUTH DAILY     Cardiovascular:  ACE Inhibitors Failed - 12/14/2023 12:10 PM      Failed - Cr in normal range and within 180 days    Creatinine, Ser  Date Value Ref Range Status  06/12/2023 0.63 0.57 - 1.00 mg/dL Final         Failed - K in normal range and within 180 days    Potassium  Date Value Ref Range Status  06/12/2023 4.4 3.5 - 5.2 mmol/L Final         Passed - Patient is not pregnant      Passed - Last BP in normal range    BP Readings from Last 1 Encounters:  12/11/23 136/78         Passed - Valid encounter within last 6 months    Recent Outpatient Visits           3 days ago Essential hypertension   [facility] Bernardo Fend, DO   3 months ago Type 2 diabetes mellitus without complication, without long-term current use of insulin [facility])   [place] [facility] [place], Michelene, PA-C   5 months ago Type 2 diabetes mellitus without complication, without long-term current use of insulin [facility])   [place] [facility] Leavy Michelene, PA-C   6 months ago Type 2 diabetes mellitus without complication, without long-term current use of insulin [facility])   [place] [facility] Leavy Michelene, PA-C       Future Appointments             In 10 months Hester Alm BROCKS, MD [facility]

## 2023-12-17 ENCOUNTER — Telehealth: Payer: Self-pay | Admitting: Family Medicine

## 2023-12-17 DIAGNOSIS — R519 Headache, unspecified: Secondary | ICD-10-CM

## 2023-12-17 MED ORDER — SUMATRIPTAN SUCCINATE 25 MG PO TABS: ORAL_TABLET | ORAL | 0 refills | Status: DC

## 2023-12-17 NOTE — Telephone Encounter (Signed)
 SUMAtriptan  (IMITREX ) 25 MG tablet

## 2023-12-18 ENCOUNTER — Encounter: Payer: Self-pay | Admitting: Internal Medicine

## 2023-12-27 NOTE — Progress Notes (Signed)
 [name]                                          MRN: [id number]   12/27/2023   The VBCI Quality Team Specialist reviewed this patient medical record for the purposes of chart review for care gap closure. The following were reviewed: abstraction for care gap closure-kidney health evaluation for diabetes:eGFR  and uACR.    VBCI Quality Team

## 2024-01-09 ENCOUNTER — Encounter: Payer: Self-pay | Admitting: Physician Assistant

## 2024-01-11 ENCOUNTER — Encounter: Payer: Self-pay | Admitting: Internal Medicine

## 2024-01-14 ENCOUNTER — Ambulatory Visit: Payer: Medicare (Managed Care) | Attending: Internal Medicine

## 2024-01-14 ENCOUNTER — Ambulatory Visit: Payer: Medicare (Managed Care) | Admitting: Family Medicine

## 2024-01-14 ENCOUNTER — Encounter: Payer: Self-pay | Admitting: Internal Medicine

## 2024-01-14 ENCOUNTER — Ambulatory Visit (INDEPENDENT_AMBULATORY_CARE_PROVIDER_SITE_OTHER): Payer: Medicare (Managed Care) | Admitting: Internal Medicine

## 2024-01-14 VITALS — BP 124/72 | HR 100 | Temp 98.3°F | Resp 16 | Ht 62.0 in | Wt 265.8 lb

## 2024-01-14 DIAGNOSIS — R519 Headache, unspecified: Secondary | ICD-10-CM

## 2024-01-14 DIAGNOSIS — I10 Essential (primary) hypertension: Secondary | ICD-10-CM | POA: Diagnosis not present

## 2024-01-14 DIAGNOSIS — Z7984 Long term (current) use of oral hypoglycemic drugs: Secondary | ICD-10-CM

## 2024-01-14 DIAGNOSIS — E1169 Type 2 diabetes mellitus with other specified complication: Secondary | ICD-10-CM | POA: Diagnosis not present

## 2024-01-14 DIAGNOSIS — Z1231 Encounter for screening mammogram for malignant neoplasm of breast: Secondary | ICD-10-CM

## 2024-01-14 DIAGNOSIS — R002 Palpitations: Secondary | ICD-10-CM

## 2024-01-14 DIAGNOSIS — E119 Type 2 diabetes mellitus without complications: Secondary | ICD-10-CM

## 2024-01-14 MED ORDER — SUMATRIPTAN SUCCINATE 50 MG PO TABS: 50.0000 mg | ORAL_TABLET | ORAL | 1 refills | Status: AC | PRN

## 2024-01-14 MED ORDER — METFORMIN HCL 500 MG PO TABS: 500.0000 mg | ORAL_TABLET | Freq: Every day | ORAL | 1 refills | Status: AC

## 2024-01-14 MED ORDER — LISINOPRIL 20 MG PO TABS: 20.0000 mg | ORAL_TABLET | Freq: Every day | ORAL | 1 refills | Status: AC

## 2024-01-14 MED ORDER — PROPRANOLOL HCL 20 MG PO TABS: 20.0000 mg | ORAL_TABLET | Freq: Two times a day (BID) | ORAL | 1 refills | Status: AC

## 2024-01-14 NOTE — Progress Notes (Signed)
 "  Acute Office Visit  Subjective:     Patient ID: Tina Stephenson, female    DOB: 11/07/1955, 69 y.o.   MRN: [id number]  Chief Complaint  Patient presents with   Hypertension    4 week recheck    Hypertension Associated symptoms include headaches. Pertinent negatives include no blurred vision, chest pain or shortness of breath.  Migraine  Pertinent negatives include no blurred vision, coughing or fever. Her past medical history is significant for hypertension.   Patient is in today for blood pressure and headache.   Discussed the use of AI scribe software for clinical note transcription with the patient, who gave verbal consent to proceed.  History of Present Illness  Tina Stephenson is a 70 year old female with hypertension who presents with daily headaches and palpitations.  She has daily headaches that begin as mild tension and intensify through the day, sometimes waking her from sleep and feeling like a band across her head. She also has migraines with unilateral pain, photophobia, phonophobia, and motion sensitivity. Sumatriptan  has been effective, but she has run out and needs a refill.  She started lisinopril  for hypertension 4 weeks ago with overall improved blood pressure, though readings rose slightly after the holidays. She notes her headaches were worse when her blood pressure was higher.  She has intermittent palpitations described as a racing heartbeat, sometimes in the morning. Her heart rate was 100 on arrival, which she attributes to walking. A home monitor sometimes flags an irregular rhythm, though she is asymptomatic during these episodes. Her father had a quadruple bypass. Current medications are lisinopril  and metformin , which is due for refill, and she needs a refill of sumatriptan  for migraines.   Review of Systems  Constitutional:  Negative for chills and fever.  Eyes:  Negative for blurred vision.  Respiratory:  Negative for cough and shortness of breath.    Cardiovascular:  Negative for chest pain.  Neurological:  Positive for headaches.        Objective:    BP 124/72   Pulse 100   Temp 98.3 F (36.8 C) (Oral)   Resp 16   Ht 5' 2 (1.575 m)   Wt 265 lb 12.8 oz (120.6 kg)   SpO2 97%   BMI 48.62 kg/m  BP Readings from Last 3 Encounters:  01/14/24 124/72  12/11/23 136/78  08/28/23 132/76   Wt Readings from Last 3 Encounters:  01/14/24 265 lb 12.8 oz (120.6 kg)  12/11/23 262 lb 4.8 oz (119 kg)  08/28/23 250 lb (113.4 kg)     Vitals:   01/14/24 0828  BP: 124/72     Physical Exam Constitutional:      Appearance: Normal appearance.  HENT:     Head: Normocephalic and atraumatic.  Eyes:     Conjunctiva/sclera: Conjunctivae normal.  Cardiovascular:     Rate and Rhythm: Normal rate and regular rhythm.  Pulmonary:     Effort: Pulmonary effort is normal.     Breath sounds: Normal breath sounds.  Musculoskeletal:     Right lower leg: No edema.     Left lower leg: No edema.  Skin:    General: Skin is warm and dry.  Neurological:     General: No focal deficit present.     Mental Status: She is alert. Mental status is at baseline.  Psychiatric:        Mood and Affect: Mood normal.        Behavior: Behavior normal.  No results found for any visits on 01/14/24.      Assessment & Plan:   Assessment & Plan  Migraine and tension-type headache Chronic daily headaches with features of both tension-type and migraine. Sumatriptan  effective for migraines. Propranolol  chosen for prevention due to dual benefits and cost-effectiveness. - Refilled sumatriptan  50 mg for acute treatment. - Prescribed propranolol  20 mg twice daily for prevention. - Follow-up in one month to assess propranolol  response.  Essential hypertension Blood pressure improved with lisinopril , current readings 124/72 mmHg. - Continue lisinopril . - Refilled lisinopril  for six months.  Type 2 diabetes mellitus w/Hyperglycemia  Metformin  due for  refill soon. - Refilled metformin  prescription. - Recheck A1c.   Palpitations Intermittent palpitations with heart rate of 100 bpm. EKG performed which was reassuring and showing normal sinus rhythm. Discussed Zio patch for extended monitoring if needed. - Ordered EKG. - Ordered thyroid  function tests. - Consider Zio patch for extended monitoring if EKG normal and symptoms persist.  General health maintenance Routine health maintenance discussed. - Scheduled mammogram after March 3rd. - Ordered routine labs including kidney, liver, electrolytes, A1c, and thyroid  function tests.  - SUMAtriptan  (IMITREX ) 50 MG tablet; Take 1 tablet (50 mg total) by mouth every 2 (two) hours as needed for migraine. May repeat in 2 hours if headache persists or recurs.  Dispense: 30 tablet; Refill: 1 - CBC w/Diff/Platelet - Comprehensive Metabolic Panel (CMET) - lisinopril  (ZESTRIL ) 20 MG tablet; Take 1 tablet (20 mg total) by mouth daily.  Dispense: 90 tablet; Refill: 1 - HgB A1c - metFORMIN  (GLUCOPHAGE ) 500 MG tablet; Take 1 tablet (500 mg total) by mouth daily with breakfast.  Dispense: 90 tablet; Refill: 1 - TSH - propranolol  (INDERAL ) 20 MG tablet; Take 1 tablet (20 mg total) by mouth 2 (two) times daily.  Dispense: 60 tablet; Refill: 1 - EKG 12-Lead - LONG TERM MONITOR (3-14 DAYS); Future - MM 3D SCREENING MAMMOGRAM BILATERAL BREAST; Future   Return in about 6 weeks (around 02/25/2024).  Sharyle Fischer, DO   "

## 2024-01-15 ENCOUNTER — Ambulatory Visit: Payer: Self-pay | Admitting: Internal Medicine

## 2024-01-15 LAB — CBC WITH DIFFERENTIAL/PLATELET
Basophils Absolute: 0.1 x10E3/uL (ref 0.0–0.2)
Basos: 1 %
EOS (ABSOLUTE): 0.1 x10E3/uL (ref 0.0–0.4)
Eos: 2 %
Hematocrit: 39.4 % (ref 34.0–46.6)
Hemoglobin: 12.6 g/dL (ref 11.1–15.9)
Immature Grans (Abs): 0 x10E3/uL (ref 0.0–0.1)
Immature Granulocytes: 0 %
Lymphocytes Absolute: 2.1 x10E3/uL (ref 0.7–3.1)
Lymphs: 32 %
MCH: 28.9 pg (ref 26.6–33.0)
MCHC: 32 g/dL (ref 31.5–35.7)
MCV: 90 fL (ref 79–97)
Monocytes Absolute: 0.4 x10E3/uL (ref 0.1–0.9)
Monocytes: 7 %
Neutrophils Absolute: 3.9 x10E3/uL (ref 1.4–7.0)
Neutrophils: 58 %
Platelets: 323 x10E3/uL (ref 150–450)
RBC: 4.36 x10E6/uL (ref 3.77–5.28)
RDW: 12.6 % (ref 11.7–15.4)
WBC: 6.6 x10E3/uL (ref 3.4–10.8)

## 2024-01-15 LAB — COMPREHENSIVE METABOLIC PANEL WITH GFR
ALT: 36 IU/L — ABNORMAL HIGH (ref 0–32)
AST: 25 IU/L (ref 0–40)
Albumin: 4.5 g/dL (ref 3.9–4.9)
Alkaline Phosphatase: 148 IU/L — ABNORMAL HIGH (ref 49–135)
BUN/Creatinine Ratio: 20 (ref 12–28)
BUN: 17 mg/dL (ref 8–27)
Bilirubin Total: 1.2 mg/dL (ref 0.0–1.2)
CO2: 25 mmol/L (ref 20–29)
Calcium: 9.5 mg/dL (ref 8.7–10.3)
Chloride: 102 mmol/L (ref 96–106)
Creatinine, Ser: 0.86 mg/dL (ref 0.57–1.00)
Globulin, Total: 2.5 g/dL (ref 1.5–4.5)
Glucose: 331 mg/dL — ABNORMAL HIGH (ref 70–99)
Potassium: 4.7 mmol/L (ref 3.5–5.2)
Sodium: 140 mmol/L (ref 134–144)
Total Protein: 7 g/dL (ref 6.0–8.5)
eGFR: 74 mL/min/1.73

## 2024-01-15 LAB — HEMOGLOBIN A1C
Est. average glucose Bld gHb Est-mCnc: 189 mg/dL
Hgb A1c MFr Bld: 8.2 % — ABNORMAL HIGH (ref 4.8–5.6)

## 2024-01-15 LAB — TSH: TSH: 1.73 u[IU]/mL (ref 0.450–4.500)

## 2024-01-15 NOTE — Telephone Encounter (Signed)
 Duplicate request, refilled 01/14/24.  Requested Prescriptions  Pending Prescriptions Disp Refills   propranolol  (INDERAL ) 20 MG tablet [Pharmacy Med Name: PROPRANOLOL  20MG  TABLETS] 180 tablet     Sig: TAKE 1 TABLET(20 MG) BY MOUTH TWICE DAILY     Cardiovascular:  Beta Blockers Passed - 01/15/2024  1:54 PM      Passed - Last BP in normal range    BP Readings from Last 1 Encounters:  01/14/24 124/72         Passed - Last Heart Rate in normal range    Pulse Readings from Last 1 Encounters:  01/14/24 100         Passed - Valid encounter within last 6 months    Recent Outpatient Visits           Yesterday Essential hypertension   [facility] Bernardo Fend, DO   1 month ago Essential hypertension   [facility] Bernardo Fend, DO   4 months ago Type 2 diabetes mellitus without complication, without long-term current use of insulin [facility])   [place] [facility] Leavy Mole, PA-C   6 months ago Type 2 diabetes mellitus without complication, without long-term current use of insulin [facility])   [place] [facility] Leavy Mole, PA-C   7 months ago Type 2 diabetes mellitus without complication, without long-term current use of insulin [facility])   [place] [facility] Leavy Mole, PA-C       Future Appointments             In 9 months Hester Alm BROCKS, MD [facility]

## 2024-01-30 DIAGNOSIS — R002 Palpitations: Secondary | ICD-10-CM | POA: Diagnosis not present

## 2024-02-18 ENCOUNTER — Ambulatory Visit: Payer: Medicare (Managed Care) | Admitting: Family Medicine

## 2024-02-26 ENCOUNTER — Ambulatory Visit: Payer: Medicare (Managed Care) | Admitting: Internal Medicine

## 2024-02-29 ENCOUNTER — Ambulatory Visit: Payer: Medicare (Managed Care) | Admitting: Family Medicine

## 2024-03-21 ENCOUNTER — Ambulatory Visit: Payer: Medicare (Managed Care)

## 2024-10-22 ENCOUNTER — Ambulatory Visit: Payer: Medicare (Managed Care) | Admitting: Dermatology
